# Patient Record
Sex: Female | Born: 1940 | Race: White | Hispanic: No | Marital: Married | Smoking: Former smoker
Health system: Southern US, Community
[De-identification: ages and names within clinical notes are randomized; demographics above are authoritative.]

## PROBLEM LIST (undated history)

## (undated) DIAGNOSIS — T4145XA Adverse effect of unspecified anesthetic, initial encounter: Secondary | ICD-10-CM

## (undated) DIAGNOSIS — J9819 Other pulmonary collapse: Secondary | ICD-10-CM

## (undated) DIAGNOSIS — Z9889 Other specified postprocedural states: Secondary | ICD-10-CM

## (undated) DIAGNOSIS — I10 Essential (primary) hypertension: Secondary | ICD-10-CM

## (undated) DIAGNOSIS — J9383 Other pneumothorax: Secondary | ICD-10-CM

## (undated) DIAGNOSIS — R112 Nausea with vomiting, unspecified: Secondary | ICD-10-CM

## (undated) DIAGNOSIS — K219 Gastro-esophageal reflux disease without esophagitis: Secondary | ICD-10-CM

## (undated) DIAGNOSIS — D649 Anemia, unspecified: Secondary | ICD-10-CM

## (undated) DIAGNOSIS — E785 Hyperlipidemia, unspecified: Secondary | ICD-10-CM

## (undated) DIAGNOSIS — T8859XA Other complications of anesthesia, initial encounter: Secondary | ICD-10-CM

## (undated) DIAGNOSIS — K279 Peptic ulcer, site unspecified, unspecified as acute or chronic, without hemorrhage or perforation: Secondary | ICD-10-CM

## (undated) DIAGNOSIS — M810 Age-related osteoporosis without current pathological fracture: Secondary | ICD-10-CM

## (undated) HISTORY — DX: Age-related osteoporosis without current pathological fracture: M81.0

## (undated) HISTORY — DX: Other pneumothorax: J93.83

## (undated) HISTORY — PX: OTHER SURGICAL HISTORY: SHX169

## (undated) HISTORY — PX: SPINE SURGERY: SHX786

## (undated) HISTORY — DX: Other pulmonary collapse: J98.19

## (undated) HISTORY — DX: Hyperlipidemia, unspecified: E78.5

## (undated) HISTORY — PX: ABDOMINAL HYSTERECTOMY: SHX81

---

## 2000-12-30 ENCOUNTER — Other Ambulatory Visit: Admission: RE | Admit: 2000-12-30 | Discharge: 2000-12-30 | Payer: Self-pay | Admitting: Obstetrics and Gynecology

## 2002-05-11 ENCOUNTER — Encounter: Payer: Self-pay | Admitting: Obstetrics and Gynecology

## 2002-05-11 ENCOUNTER — Ambulatory Visit (HOSPITAL_COMMUNITY): Admission: RE | Admit: 2002-05-11 | Discharge: 2002-05-11 | Payer: Self-pay | Admitting: Obstetrics and Gynecology

## 2003-08-05 HISTORY — PX: OOPHORECTOMY: SHX86

## 2004-01-11 ENCOUNTER — Inpatient Hospital Stay (HOSPITAL_COMMUNITY): Admission: RE | Admit: 2004-01-11 | Discharge: 2004-01-30 | Payer: Self-pay | Admitting: Obstetrics & Gynecology

## 2004-01-19 ENCOUNTER — Encounter: Payer: Self-pay | Admitting: Pulmonary Disease

## 2004-02-01 ENCOUNTER — Emergency Department (HOSPITAL_COMMUNITY): Admission: EM | Admit: 2004-02-01 | Discharge: 2004-02-01 | Payer: Self-pay | Admitting: *Deleted

## 2004-03-21 ENCOUNTER — Ambulatory Visit (HOSPITAL_BASED_OUTPATIENT_CLINIC_OR_DEPARTMENT_OTHER): Admission: RE | Admit: 2004-03-21 | Discharge: 2004-03-21 | Payer: Self-pay | Admitting: Urology

## 2004-05-06 ENCOUNTER — Ambulatory Visit (HOSPITAL_COMMUNITY): Admission: RE | Admit: 2004-05-06 | Discharge: 2004-05-06 | Payer: Self-pay | Admitting: Obstetrics and Gynecology

## 2004-07-17 ENCOUNTER — Ambulatory Visit: Payer: Self-pay | Admitting: Internal Medicine

## 2004-07-17 ENCOUNTER — Ambulatory Visit (HOSPITAL_COMMUNITY): Admission: RE | Admit: 2004-07-17 | Discharge: 2004-07-17 | Payer: Self-pay | Admitting: Internal Medicine

## 2005-05-17 ENCOUNTER — Emergency Department (HOSPITAL_COMMUNITY): Admission: EM | Admit: 2005-05-17 | Discharge: 2005-05-18 | Payer: Self-pay | Admitting: Emergency Medicine

## 2005-06-19 ENCOUNTER — Other Ambulatory Visit: Admission: RE | Admit: 2005-06-19 | Discharge: 2005-06-19 | Payer: Self-pay | Admitting: Dermatology

## 2005-06-23 ENCOUNTER — Ambulatory Visit (HOSPITAL_COMMUNITY): Admission: RE | Admit: 2005-06-23 | Discharge: 2005-06-23 | Payer: Self-pay | Admitting: Obstetrics and Gynecology

## 2006-07-20 ENCOUNTER — Ambulatory Visit (HOSPITAL_COMMUNITY): Admission: RE | Admit: 2006-07-20 | Discharge: 2006-07-20 | Payer: Self-pay | Admitting: Family Medicine

## 2007-04-01 ENCOUNTER — Ambulatory Visit (HOSPITAL_COMMUNITY): Admission: RE | Admit: 2007-04-01 | Discharge: 2007-04-01 | Payer: Self-pay | Admitting: Family Medicine

## 2009-02-20 ENCOUNTER — Ambulatory Visit (HOSPITAL_COMMUNITY): Admission: RE | Admit: 2009-02-20 | Discharge: 2009-02-20 | Payer: Self-pay | Admitting: Family Medicine

## 2009-05-16 ENCOUNTER — Ambulatory Visit (HOSPITAL_COMMUNITY): Admission: RE | Admit: 2009-05-16 | Discharge: 2009-05-16 | Payer: Self-pay | Admitting: Family Medicine

## 2009-05-31 ENCOUNTER — Ambulatory Visit (HOSPITAL_COMMUNITY): Admission: RE | Admit: 2009-05-31 | Discharge: 2009-05-31 | Payer: Self-pay | Admitting: Family Medicine

## 2009-08-13 ENCOUNTER — Ambulatory Visit: Payer: Self-pay | Admitting: Vascular Surgery

## 2009-11-13 ENCOUNTER — Ambulatory Visit: Payer: Self-pay | Admitting: Vascular Surgery

## 2010-01-07 ENCOUNTER — Ambulatory Visit: Payer: Self-pay | Admitting: Vascular Surgery

## 2010-01-15 ENCOUNTER — Ambulatory Visit: Payer: Self-pay | Admitting: Vascular Surgery

## 2010-01-24 ENCOUNTER — Ambulatory Visit: Payer: Self-pay | Admitting: Vascular Surgery

## 2010-02-19 ENCOUNTER — Ambulatory Visit: Payer: Self-pay | Admitting: Vascular Surgery

## 2010-05-13 ENCOUNTER — Ambulatory Visit (HOSPITAL_COMMUNITY): Admission: RE | Admit: 2010-05-13 | Discharge: 2010-05-13 | Payer: Self-pay | Admitting: Family Medicine

## 2010-06-06 ENCOUNTER — Ambulatory Visit (HOSPITAL_COMMUNITY): Admission: RE | Admit: 2010-06-06 | Discharge: 2010-06-06 | Payer: Self-pay | Admitting: Family Medicine

## 2010-12-17 NOTE — Assessment & Plan Note (Signed)
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  She  tolerated the procedure well.  She will return in 1 week for venous  duplex exam to confirm closure of the great saphenous vein.     Quita Skye Hart Rochester, M.D.  Electronically Signed   JDL/MEDQ  D:  01/07/2010  T:  01/08/2010  Job:  [id number]

## 2010-12-17 NOTE — Procedures (Signed)
LOWER EXTREMITY VENOUS REFLUX EXAM   INDICATION:  Ropey varicose veins.   EXAM:  Using color-flow imaging and pulse Doppler spectral analysis, the  bilateral common femoral, superficial femoral, popliteal, posterior  tibial, greater and lesser saphenous veins are evaluated.  There is no  evidence suggesting deep venous insufficiency in the bilateral lower  extremities.   The bilateral saphenofemoral junctions are competent. The right GSV is  not competent with Reflux of >571milliseconds with the caliber as  described below.   The bilateral proximal short saphenous veins demonstrate competency.   GSV Diameter (used if found to be incompetent only)                                            Right    Left  Proximal Greater Saphenous Vein           0.54 cm  cm  Proximal-to-mid-thigh                     0.37 cm  cm  Mid thigh                                 0.36 cm  cm  Mid-distal thigh                          cm       cm  Distal thigh                              0.31 cm  cm  Knee                                      0.36 cm  cm   IMPRESSION:  1. Right greater saphenous vein Reflux with >555milliseconds is      identified with the caliber ranging from 0.36 cm to 0.54 cm to the      groin.  2. The bilateral greater saphenous veins are not aneurysmal.  3. The bilateral greater saphenous veins are not tortuous.  4. The deep venous system is competent.  5. The bilateral lesser saphenous vein is competent.   ___________________________________________  Quita Skye. Hart [place], M.D.   CB/MEDQ  D:  08/13/2009  T:  08/13/2009  Job:  [id number]

## 2010-12-17 NOTE — Assessment & Plan Note (Signed)
OFFICE VISIT   Jeanne Rogers, Jeanne Rogers  DOB:  1941-01-08                                       11/13/2009  CHART#:[id number]   The patient returns today for further followup regarding her severe  venous insufficiency of her right leg.  She has had bulging varicosities  which have increased in size over the past 25 years and now cause  aching, throbbing and burning discomfort in the right thigh and calf  particularly when she is standing or sitting.  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  The long-  leg elastic compression stockings for the past 3 months have not  improved her symptoms nor has ibuprofen or elevation of her legs as much  as possible.  It is affecting her daily living.   PHYSICAL EXAM:  Today she continues to have bulging varicosities in  distal thigh medially along the medial calf extending into the pretibial  region below the knee on the right.  She has a large nest of reticular  and spider veins in the ankle area with palpable dorsalis pedis pulses  bilaterally.   Review of her venous duplex exam reveals gross reflux of the entire  right great saphenous vein from the knee to the saphenofemoral junction  which is feeding these bulging varicosities which are quite symptomatic.  Deep system is normal.   I think she should undergo laser ablation of the right great saphenous  vein with multiple stab phlebectomies to relieve her symptomatology.  We  will proceed with precertification to perform this in the near future.     Quita Skye Hart Rochester, M.D.  Electronically Signed   JDL/MEDQ  D:  11/13/2009  T:  11/14/2009  Job:  [id number]

## 2010-12-17 NOTE — Consult Note (Signed)
NEW PATIENT CONSULTATION   Rogers, Jeanne E  DOB:  03/06/1941                                       08/13/2009  CHART#:[id number]   NEW PATIENT CONSULTATION:  The patient is a 70 year old female patient  referred by Dr. Gerda Diss for severe venous insufficiency of the right leg  with painful varicosities.  This patient has had varicosities in the  right leg over the last 25 years, which have gradually increased in size  have become increasingly symptomatic.  She now complains of aching,  throbbing and burning discomfort in the right lower thigh and calf which  worsens as the day progresses.  She develops swelling in the ankle as  the day progresses, increasing her symptomatology.  She has had no  history of bleeding or stasis ulcers, no thrombophlebitis or deep venous  thrombosis.  She does not wear elastic compression stockings and  elevates her legs as much as possible during the day.  She does not take  pain medicine.  She states this is affecting her ability to do normal  activities because of the increasing symptoms which she is experiencing.   PAST MEDICAL HISTORY:  1. Hyperlipidemia.  2. History of TIA in 1997.  3. Negative for diabetes, hypertension, coronary artery disease, COPD      or stroke.   PAST SURGICAL HISTORY:  1. Bladder tack procedure.  2. Bilateral bunion repairs of the lower extremities.   FAMILY HISTORY:  Positive for stroke in her mother and father.  Negative  for diabetes or coronary artery disease.   SOCIAL HISTORY:  She is married, has 4 children.  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  Has not smoked in over 20 years.  Does not use  alcohol.   REVIEW OF SYSTEMS:  Positive for weight gain, nocturia, leg discomfort  as noted in the present illness, bunion surgery, both feet.  Negative  for all other systems in the review of systems.   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   PHYSICAL EXAMINATION:  Blood pressure is 151/80, heart rate is 86,  respirations 20.  Generally, she is a healthy-appearing female who is in  no apparent distress.  She is alert and oriented x3.  She is well-  developed and well-nourished.  HEENT exam is normal with EOMs intact.  Neck is supple, 3+ carotid pulses.  No bruits are audible.  Neurologic  exam is normal.  Chest clear to auscultation.  Cardiovascular reveals a  regular rhythm, no murmurs.  Abdomen is soft, nontender, with no masses  palpable.  Musculoskeletal exam:  No major deformities.  Skin exam  reveals no rashes.  Lower extremity exam reveals 3+ femoral, popliteal  and dorsalis pedis pulses bilaterally.  She has severe venous  insufficiency of the right leg with bulging varicosities in the distal  thigh extending into the medial calf anterior to the tibia with a large  pattern of reticular and spider veins in the lower third of the right  leg.  Left leg has no bulging varicosities but does have some reticular  and spider veins.   Venous duplex exam in our office today reveals gross reflux in the right  great saphenous vein feeding these varicosities in the lower thigh and  calf  with normal deep system.   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  We will treat  her with long-leg elastic compression stockings (20 mm-30-mm gradient)  as well as elevation and daily ibuprofen to see if it will relieve her  symptoms.  She will return in 3 months and if she has had no  improvement, I think she would be best treated by laser ablation of the  right great saphenous vein with multiple stab phlebectomies.     Jeanne Rogers, M.D.  Electronically Signed   JDL/MEDQ  D:  08/13/2009  T:  08/14/2009  Job:  [id number]   cc:   Lorin Picket A. Gerda Diss, MD

## 2010-12-17 NOTE — Assessment & Plan Note (Signed)
OFFICE VISIT   Weida, MARY E  DOB:  [birth year]                                       01/15/2010  CHART#:[id number]   The patient had laser ablation of her right great saphenous vein with 10-  20 stab phlebectomies for painful varicosities performed on June 6.  She  returns today for initial followup.  She has had some moderate  discomfort along the medial and proximal thigh area along the course of  the great saphenous vein which is improving.  She has had no pain at the  stab phlebectomy sites and no distal edema.  She has been wearing her  elastic compression stocking which makes her leg feel better.   On exam today her blood pressure is 146/81, heart rate 79, respirations  14.  There is mild tenderness along the course of the great saphenous  vein in the thigh and the stab phlebectomy sites are healing nicely.  She has a 3+ dorsalis pedis pulse distally with no edema.  Venous duplex  exam reveals total closure of the right great saphenous vein with no  evidence of deep venous obstruction.  She was reassured regarding these  findings and will return in the near future for some courses of  sclerotherapy to complete her treatment.     Quita Skye Hart Rochester, M.D.  Electronically Signed   JDL/MEDQ  D:  01/15/2010  T:  01/16/2010  Job:  [id number]

## 2010-12-17 NOTE — Procedures (Signed)
DUPLEX DEEP VENOUS EXAM - LOWER EXTREMITY   INDICATION:  Follow up 1 week post right greater saphenous laser  procedure.   HISTORY:  Edema:  Yes.  Trauma/Surgery:  On 01/07/10, right greater saphenous vein laser  procedure.  Pain:  Yes.  PE:  No.  Previous DVT:  No.  Anticoagulants:  No.  Other:   DUPLEX EXAM:                CFV   SFV   PopV  PTV    GSV                R  L  R  L  R  L  R   L  R  L  Thrombosis    o  o  o     o     o  Spontaneous   +  +  +     +     +  Phasic        +  +  +     +     +  Augmentation  +  +  +     +     +  Compressible  +  +  +     +     +  Competent     +  +  +     +     +   Legend:  + - yes  o - no  p - partial  D - decreased   IMPRESSION:  1. The right leg is negative for deep venous thrombosis.  2. The right greater saphenous vein is lasered closed.  Areas of      phlebectomey's appear to be closed off.    _____________________________  Quita Skye. Hart Rochester, M.D.   NT/MEDQ  D:  01/15/2010  T:  01/15/2010  Job:  [id number]

## 2010-12-20 NOTE — Procedures (Signed)
NAME:  Jeanne Rogers, Jeanne Rogers                             ACCOUNT NO.:  [id number]   MEDICAL RECORD NO.:  [id number]                  PATIENT TYPE:  INP   LOCATION:                                       FACILITY:   PHYSICIAN:  Seligman Bing, M.D.               DATE OF BIRTH:  07-10-1941   DATE OF PROCEDURE:  DATE OF DISCHARGE:  01/19/2004                                  ECHOCARDIOGRAM   REFERRING PHYSICIAN:  Drs. Luking and Eure   INDICATIONS:  A 70 year old woman with congestive heart failure and  ventricular tachycardia.   M-MODE TRACINGS:  1. Aorta 2.4  2. Left atrium 2.3.  3. Septum 1.1.  4. Posterior wall 1.0.  5. LV diastole 3.1  6. LV systole 1.8.   IMPRESSION:  1. Technically difficult and somewhat limited echocardiographic study.  2. Normal left atrial size; right atrium not adequately imaged.  3. Normal right ventricular size and function.  4. Normal mitral valve.  5. Aortic, pulmonic, and tricuspid valves not adequately imaged.  6. Normal descending aorta.  7. Normal internal dimension of the left ventricle; wall thickness at the     upper limit of normal.  Normal regional and global LV systolic function.      ___________________________________________                                            Apollo Bing, M.D.   RR/MEDQ  D:  01/19/2004  T:  01/20/2004  Job:  [id number]

## 2010-12-20 NOTE — Op Note (Signed)
NAME:  Suess, Alben Spittle                             ACCOUNT NO.:  [id number]   MEDICAL RECORD NO.:  [id number]                   PATIENT TYPE:  INP   LOCATION:  A425                                 FACILITY:  [facility]   PHYSICIAN:  R. Roetta Sessions, M.D.              DATE OF BIRTH:  [birth year]   DATE OF PROCEDURE:  01/15/2004  DATE OF DISCHARGE:                                 OPERATIVE REPORT   PROCEDURE:  Esophagogastroduodenoscopy with biopsy.   INDICATIONS FOR PROCEDURE:  The patient is a 70 year old lady status post  GYN surgery 01/11/2004.  She has had persistent nausea, vomiting, and some  Gastroccult-positive coffee-ground emesis.  She has remained hemodynamically  stable.  Her hemoglobin has remained stable at 11.4.  Repeat LFTs came back  okay.  EGD is now being done.  This approach has been discussed with the  patient at length.  The potential risks, benefits, and alternatives have  been reviewed and questions answered.  Please see the documentation in the  medical record.   PROCEDURE:  O2 saturation, blood pressure, pulses, and respirations were  monitored throughout the entire procedure.  Conscious sedation was with  Versed 1 mg IV, Demerol 25 mg IV in divided doses, Cetacaine spray for  topical oropharyngeal anesthesia.  The instrument used was the Olympus video  chip system.   FINDINGS:  Examination of the tubular esophagus revealed multiple 2-mm pale  nodules throughout the esophagus.  There was excoriation at the GE junction  and a noncritical ring.  The esophageal mucosa otherwise appeared normal,  and the EG junction was easily traversed.   Stomach:  The gastric cavity was empty and insufflated well with air.  Thorough examination of the gastric mucosa including retroflex view of the  proximal stomach and esophagogastric junction demonstrated only a small  hiatal hernia and three 2 to 3-mm stellate ulcers in the antrum without  bleeding stigmata.  Please see photos.  The  pylorus was patent and easily  traversed.   Duodenum:  Examination of the bulb and second portion revealed no  abnormalities.   THERAPEUTIC/DIAGNOSTIC MANEUVERS PERFORMED:  One of the nodules in the  esophagus was biopsied for histologic study.  The patient tolerated the  procedure well and was reactive in endoscopy.   IMPRESSION:  1. 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  2. Excoriation at the esophagogastric junction likely indicative of a minor     Mallory-Weiss tear.  3. 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  Otherwise normal     esophagus.  4. Stomach with small hiatal hernia.  Three small gastric ulcers without     bleeding stigmata.  Otherwise normal gastric mucosa.  5. Normal first and second portions of the duodenum.  6. I suspect the patient has suffered trivial upper gastrointestinal     bleeding  secondary to a minor Mallory-Weiss tear.   RECOMMENDATIONS:  1. Check H pylori serologies.  2. Avoid nonsteroidals.  3. Clear liquid diet post-endoscopy, advance then as tolerated.  Will change     her medications over to oral administration on 01/16/2004 if she does okay     clinically.   The findings and recommendations have been discussed with Dr. Despina Hidden.   As a totally separate issue, she had a sigmoidoscopy five years ago.  She  has never had a full colonoscopy.  She ought to consider a screening  colonoscopy sometime between now and the end of the year.      ___________________________________________                                            Jonathon Bellows, M.D.   RMR/MEDQ  D:  01/15/2004  T:  01/15/2004  Job:  [id number]   cc:   Lazaro Arms, M.D.  [address], [address]  [place]  [place] [address]  Fax: [phone]   W. Simone Curia, M.D.  [address]  [place]  [place] [address]  Fax: [phone]

## 2010-12-20 NOTE — H&P (Signed)
NAME:  Samudio, Alben Spittle                             ACCOUNT NO.:  [id number]   MEDICAL RECORD NO.:  [id number]                   PATIENT TYPE:  AMB   LOCATION:  DAY                                  FACILITY:  [facility]   PHYSICIAN:  Lazaro Arms, M.D.                DATE OF BIRTH:  06-Jun-1941   DATE OF ADMISSION:  DATE OF DISCHARGE:                                HISTORY & PHYSICAL   HISTORY OF PRESENT ILLNESS:  Jeanne Rogers is a 70 year old white female who has been  followed in our practice for years.  She has had worsening pelvic prolapse  and she has actually been trying to use a pessary for some time.  The  patient also has mixed urinary incontinence, both stress and urge.  On  examination in the office, she has a grade 3 cystocele in the midline, a  grade 2 uterine prolapse.  I am unimpressed with her degree of rectocele at  this point and would recommend that she have that repaired in the future as  a separate procedure if that becomes more of a problem.  After failing 2  pessaries, I recommend that she have a TVH/BSO, anterior colporrhaphy with  Pelvicol graft and because of her significant cystocele, if we were to  repair that without doing a sling, she would have, I am sure, stress  incontinence, as a result, she will have a retropubic sling placed.  She is  on Detrol LA preoperatively because of frequency and urgency at night; I put  her on that on Dec 13, 2003 and that has gotten significantly better in the  meantime.  She has also been on preoperative Premarin vaginal cream.   PAST MEDICAL HISTORY:  She has a history of bilateral collapsed lungs.   PAST SURGICAL HISTORY:  Past surgical history is related to the therapy for  the collapsed lungs with chest tubes and in 1996, an endometrial biopsy.   PAST OBSTETRICAL HISTORY:  Four vaginal deliveries and 1 miscarriage.   CURRENT MEDICATIONS:  Her current medications are just over-the-counter anti-  inflammatories in the past for her back  and she also has taken Relafen and  Mobic for pain in the low back and she has been on the Premarin vaginal  cream and the Detrol.   ALLERGIES:  Allergies are to METHERGINE.   REVIEW OF SYSTEMS:  Review of systems otherwise negative.   PHYSICAL EXAMINATION:  HEENT:  Unremarkable.  NECK:  Thyroid is normal.  LUNGS:  Lungs are clear.  HEART:  Heart is regular rate and rhythm with murmur, regurgitation or  gallop.  BREASTS:  Breasts without masses, discharge or skin changes.  ABDOMEN:  Abdomen is benign with no hepatosplenomegaly or masses.  PELVIC:  She has normal external genitalia.  She does have an obvious  cystocele at rest in the supine position and just behind that is her uterine  prolapse as well.  The adnexa are negative.  RECTAL:  Rectal exam I would say is a grade 1 rectocele but fairly  unimpressive and she does want to maintain sexual function.  EXTREMITIES:  Extremities are warm with no edema.  NEUROLOGICAL:  Exam is grossly intact.   IMPRESSION:  Symptomatic pelvic prolapse with midline cystocele and uterine  prolapse.   PLAN:  The patient is admitted for a TVH/BSO, anterior colporrhaphy with  Pelvicol graft and a retropubic sling;  the patient does want to continue  sexual function.  We will also do a vaginal vault suspension.  The patient  understands the risks, benefits, indications and alternatives and will  proceed.     ___________________________________________                                         Lazaro Arms, M.D.   Loraine Maple  D:  01/10/2004  T:  01/11/2004  Job:  [id number]

## 2010-12-20 NOTE — Procedures (Signed)
NAME:  WILFRED, SIVERSON                             ACCOUNT NO.:  [id number]   MEDICAL RECORD NO.:  [id number]                   PATIENT TYPE:  INP   LOCATION:  0156                                 FACILITY:  [facility]   PHYSICIAN:  Edward L. Juanetta Gosling, M.D.             DATE OF BIRTH:  27-Jan-1941   DATE OF PROCEDURE:  DATE OF DISCHARGE:                                EKG INTERPRETATION   TIME:  January 19, 2004, at 13:36   FINDINGS:  1. The computer has read the rhythm as atrial flutter with 2:1 AV     conduction.  I am not certain that is correct.  I think that this is a     sinus rhythm. I do not see definite flutter waves, although there are T-     wave abnormalities which could look like a P wave.  2. There are ST-T wave abnormalities which may be due to ischemia.   IMPRESSION:  Abnormal electrocardiogram.      ___________________________________________                                            Oneal Deputy. Juanetta Gosling, M.D.   Gwenlyn Found  D:  01/22/2004  T:  01/22/2004  Job:  [id number]

## 2010-12-20 NOTE — Op Note (Signed)
NAME:  CASHAY, MANGANELLI                             ACCOUNT NO.:  [id number]   MEDICAL RECORD NO.:  [id number]                   PATIENT TYPE:  INP   LOCATION:  0344                                 FACILITY:  [facility]   PHYSICIAN:  Excell Seltzer. Annabell Howells, M.D.                 DATE OF BIRTH:  1941-06-13   DATE OF PROCEDURE:  01/29/2004  DATE OF DISCHARGE:                                 OPERATIVE REPORT   OPERATION/PROCEDURE:  1. Cystoscopy.  2. Left retrograde pyelogram.  3. Left ureteroscopy.  4. Insertion of left double-J stent.   PREOPERATIVE DIAGNOSIS:  Left ureteral obstruction.   POSTOPERATIVE DIAGNOSIS:  Left ureteral obstruction, likely due to surgical  entrapment of the ureter from recent vaginal hysterectomy.   SURGEON:  Excell Seltzer. Annabell Howells, M.D.   ANESTHESIA:  MAC.   DRAINS:  7-French x 24 cm double-J stent and 16-French Foley catheter.   COMPLICATIONS:  None.   INDICATIONS:  Mrs. Runner is a 70 year old white female who has had a  complicated hospitalization with a postoperative Mallory-Weiss tear and  aspiration pneumonia following a vaginal hysterectomy with bilateral  salpingo-oophorectomy and sling procedure.  She had been having left flank  pain and CT scan demonstrated left hydronephrosis.  This progressed  throughout her stay and it was felt that cystoscopy and retrograde were  indicated.  The level of obstruction appeared to be in the pelvis at the  level of her recent surgery.   FINDINGS:  The patient had been on Augmentin.  Recent urine was clear.   DESCRIPTION OF PROCEDURE:  She was taken to the operating room where she was  placed in the lithotomy position.  Sedation was induced.  Her peritoneum and  genitalia was prepped with Betadine solution and she was draped in the usual  sterile fashion.  Her urethra was instilled with 2% lidocaine jelly.  Cystoscopy was performed using a 22-French scope and the 12-degree lens  examination revealed an unremarkable urethra that did  not appear to be  overcorrected related to her recent sling. The bladder wall had mild  trabeculation and there was some catheter irritation on the posterior wall.  No tumors were noted within the bladder.  There were some calcium deposits  from encrustation of her Foley catheter.  The ureteral orifices were in  their normal anatomical position.  The left ureteral orifice did appear to  be a little bit heaped up but not remarkably so.  The left ureteral orifice  was cannulated with the 5-French open-end catheter.  Contrast was instilled  in the retrograde fashion.  There was apparent medial deviation of the  ureter, approximately 2 cm proximal to the orifice.  Contrast did not flow  beyond this point.  An attempt was made to pass a glide wire through the  open-end catheter, but I was unable to successfully negotiate it by the  obstruction.  I then removed the cystoscope and inserted a 6-French short  ureteroscope without difficulty up the ureteral orifice to the point of  obstruction which had a valve-like appearance consistent with a medial  kinking of the ureter.  A glide wire was then passed to the level of the  kidney.  The ureteroscope was removed and the cystoscope was placed over the  wire.  A 5-French open-end catheter was then inserted over the glide wire  and there was initial resistance but it eventually passed without difficulty  to the level of the kidney.  A prompt hydronephrotic drip was noted.  Contrast instilled demonstrated placement within the renal collecting  system.  She then underwent placement of a guidewire through the open-end  catheter after removal of the glide wire.  Once this was appropriately  positioned in the kidney, a 7-French x 24 cm double-J stent was inserted  over the wire to the kidney.  The guidewire was removed leaving good coil in  the kidney and the bladder.  The cystoscope was then removed after draining  the bladder and a 16-French Foley catheter  was inserted without difficulty.  The balloon was filled with 10 mL sterile fluid and placed to straight  drainage.  The patient was taken down from the lithotomy position and moved  to the recovery room in stable condition.   She will be catheterized over night and given a voiding trial.  The stent  should be left in for about six weeks to allow weakening of any suture  material that has associated with the kinking at which time she will need to  undergo recystoscopy with balloon dilation of the ureter, hopefully allowing  [place] to avoid a reimplantation.  There were no complications during my portion  of the procedure.                                               Excell Seltzer. Annabell Howells, M.D.    JJW/MEDQ  D:  01/29/2004  T:  01/29/2004  Job:  [id number]   cc:   Oley Balm. Sung Amabile, M.D. [facility]   Rollene Rotunda, M.D.   Scott A. Gerda Diss, M.D.  [address], [address]  [place]  [place] [address]  Fax: [phone]   Lazaro Arms, M.D.  [address]  [place]  [place] [address]  Fax: [phone]

## 2010-12-20 NOTE — Procedures (Signed)
NAME:  SHUKRI, NISTLER                             ACCOUNT NO.:  [id number]   MEDICAL RECORD NO.:  [id number]                   PATIENT TYPE:  INP   LOCATION:  0156                                 FACILITY:  [facility]   PHYSICIAN:  Edward L. Juanetta Gosling, M.D.             DATE OF BIRTH:  September 22, 1940   DATE OF PROCEDURE:  DATE OF DISCHARGE:                                EKG INTERPRETATION   TIME:  January 19, 2004, at 13:37  30.   FINDINGS:  The computer has read this as atrial flutter with 2:1 AV  conduction.  I do not see definite evidence of that, and I believe that this  is actually a sinus rhythm with a rate of about 115.  There are ST-T wave  abnormalities which could indicate ischemia.   IMPRESSION:  Abnormal electrocardiogram.      ___________________________________________                                            Oneal Deputy. Juanetta Gosling, M.D.   Gwenlyn Found  D:  01/22/2004  T:  01/22/2004  Job:  [id number]

## 2010-12-20 NOTE — Procedures (Signed)
NAME:  Jeanne Rogers, Jeanne Rogers                             ACCOUNT NO.:  [id number]   MEDICAL RECORD NO.:  [id number]                   PATIENT TYPE:  INP   LOCATION:  0156                                 FACILITY:  [facility]   PHYSICIAN:  Edward L. Juanetta Gosling, M.D.             DATE OF BIRTH:  May 23, 1941   DATE OF PROCEDURE:  DATE OF DISCHARGE:                                EKG INTERPRETATION   TIME:  January 19, 2004 at 13:32   FINDINGS:  1. The rhythm is sinus tachycardia.  2. There is probable left atrial enlargement.  3. ST-T wave abnormalities are seen which may indicate ischemia, but are     nonspecific.   IMPRESSION:  Abnormal electrocardiogram.      ___________________________________________                                            Oneal Deputy. Juanetta Gosling, M.D.   Gwenlyn Found  D:  01/22/2004  T:  01/22/2004  Job:  [id number]

## 2010-12-20 NOTE — Discharge Summary (Signed)
NAME:  Jeanne Rogers, Jeanne Rogers                             ACCOUNT NO.:  [id number]   MEDICAL RECORD NO.:  [id number]                   PATIENT TYPE:  INP   LOCATION:  0156                                 FACILITY:  [facility]   PHYSICIAN:  Donna Bernard, M.D.             DATE OF BIRTH:  Oct 24, 1940   DATE OF ADMISSION:  01/19/2004  DATE OF DISCHARGE:                                 DISCHARGE SUMMARY   SUBJECTIVE:  The patient notes sensation of shortness of breath and also  notes some lower- abdominal discomfort.   OBJECTIVE:  Vital signs afebrile.  Pulse 115.  Respiratory rate 24 breaths  per minute.  The patient is more alert than she had been earlier in the  week.  There is some mild tachypnea on exam.  Chest exam shows diffuse mild  crackles throughout the chest.  No significant wheezes, no rhonchi.  Heart,  mild tachycardia.  Positive flow murmur.  Abdomen, good bowel sounds.  Upper  abdomen, no significant tenderness.  Diffuse, mild lower abdominal pain.   ADDENDUM TO PROGRESS NOTE:  The patient had a particularly rocky night.  She  developed more shortness of breath.  She was transferred from Dr. Forestine Chute  service to my service.  A CT scan was done to rule out PE.  Thankfully, she  had no pulmonary emboli present.  However, there was multiple infiltrates  along with an increased edema pattern, along with bilateral effusions.   SIGNIFICANT LABS:  CBC:  White blood count up to 20,000 with a left shift.  Met-7, decent creatinine, 2.2, glucose within normal limits.  Blood gas on  100% O2 showed pCO2 of 39, and pO2 mid 60's, pH 7.43.   ADDENDUM TO HISTORY:  I spoke with Dr. Jena Gauss, Dr. Despina Hidden, Dr. Juanetta Gosling, Dr.  Katrinka Blazing, Dr. Rosalia Hammers and Dr. Okey Dupre about the patient, covering all the above  symptomatology and diagnostic findings.   ASSESSMENT:  1. Worsening respiratory status, multifactorial, with probable aspiration     infiltrate, bilateral effusions, questionable element of congestive heart  failure.  There is significant consideration that the patient is getting     into more of an acute respiratory distress syndrome type syndrome which     is certainly a major concern in the sense of the patient's overall     status.  I am also concerned that congestive heart failure may also be a     part of things.  I have consulted with [facility].  They are yet     to see the patient.  2. Nutritional status.  Nutrition is poor.  I think we need to press on TPN.     I have a call in to Dr. Katrinka Blazing about placing a central line and pressing     on with this.  3. Partial ileus.  The patient does have some bowel sounds and relatively  soft abdomen, but I think it is probably partial ileus and possibly     related to the pulmonary issues.   PLAN:  1. IV Lasix, maintain same oxygen support.  2. Dr. Juanetta Gosling has added IV steroids which I support, particularly with     questionable element of aspiration pneumonitis.  3. I will plan on going ahead and referring to a tertiary care center for     active management of the patient's     problems.  4. Family is aware of the severity of the patient's illness, and are in     agreement with our support and plans for therapy.     ___________________________________________                                         Donna Bernard, M.D.   WSL/MEDQ  D:  01/19/2004  T:  01/20/2004  Job:  [id number]

## 2010-12-20 NOTE — Op Note (Signed)
NAME:  Jeanne Rogers, Jeanne Rogers                             ACCOUNT NO.:  [id number]   MEDICAL RECORD NO.:  [id number]                   PATIENT TYPE:  AMB   LOCATION:  NESC                                 FACILITY:  [facility]   PHYSICIAN:  Excell Seltzer. Annabell Howells, M.D.                 DATE OF BIRTH:  June 07, 1941   DATE OF PROCEDURE:  03/21/2004  DATE OF DISCHARGE:                                 OPERATIVE REPORT   PREOPERATIVE DIAGNOSES:  Left ureteral obstruction.   POSTOPERATIVE DIAGNOSES:  Left ureteral obstruction with urinary  calcification in the right upper quadrant.   PROCEDURE:  Cystoscopy, bilateral retrograde pyelograms, left ureteroscopy  with balloon dilation and insertion of left double J stent.   SURGEON:  Excell Seltzer. Annabell Howells, M.D.   ANESTHESIA:  MAC.   DRAINS:  8.5 French x 24 cm double J stent.   COMPLICATIONS:  None.   INDICATIONS FOR PROCEDURE:  Jeanne Rogers is a 70 year old white female who had a  left ureteral obstruction following vaginal hysterectomy. She had originally  undergone placement of a ureteral stent for kinking of the ureter and  returns now for reevaluation, balloon dilation and stent exchange.   FINDINGS:  The patient was given p.o. Cipro preoperatively. She was taken to  the operating room where sedation was induced. She was placed in lithotomy  position, her perineum and genitalia were prepped with Betadine solution and  she was draped in the usual sterile fashion. Cystoscopy was performed using  a 22 [place] scope and the 12 degree lens. The stent was grasped at the right  orifice with alligator forceps and removed through the urethral meatus. A  guidewire was passed through the stent up to the kidney. On fluoroscopic  views during this time, the distal ureter appeared to have a normal path no  longer exhibiting any sort of medial deviation. Once the guidewire was in  place, the cystoscope was removed then a 6.5 [place] short ureteroscope was  then passed along side the  wire. Inspection revealed some residual edema at  the area of a previous obstruction with a patent ureteral lumen and no  kinking of the ureter. At this point, I noted a 1 cm calcification in the  right upper quadrant and I did not recall whether this had had been present  on previous films and was concerned that there was a possibility of a stone  in this area so a 5 [place] open end catheter was then used through the  cystoscope to perform a gentle retrograde on that side. This study revealed  the ureter passing medially to the calcification with no ureteral or  intrarenal abnormalities noted.  After completion of the retrograde  pyelogram, a 10 cm 15 French balloon dilation catheter was passed over the  guidewire and the distal ureter was dilated. The waist disappeared rapidly  at about 8 atmospheres of pressure. Dilation  was carried up to 14  atmospheres and held for approximately 30 seconds before deflating the  balloon.  At this point, I noted that the guidewire appeared to be somewhat  distal and felt that I needed to ensure that it was properly positioned  within the kidney and open end catheter was passed over the guidewire. The  guidewire was removed and contrast was instilled. This revealed that the  wire was indeed below the UPJ, there was a kink in the ureter just below the  UPJ. A Glidewire was then passed through the open end catheter to negotiate  this kink. Once negotiated the open end catheter could be advanced to the  kidney and the kink straightened out. Standard guidewire was then replaced.  The open end catheter was removed and an 8.5 [place] 24 cm double J stent was  then inserted  over the wire to the kidney. The wire was removed leaving a good coil in the  kidney and coil in the bladder. At this point, the bladder was drained, the  cystoscope was removed, the patient was taken down from lithotomy position  and removed to the recovery room in stable condition. There  were no  complications.                                               Excell Seltzer. Annabell Howells, M.D.    JJW/MEDQ  D:  03/21/2004  T:  03/21/2004  Job:  [id number]   cc:   Donna Bernard, M.D.  [address]  [place]  [place] [address]  Fax: [phone]   Lazaro Arms, M.D.  [address], [address]  [place]  [place] [address]  Fax: [phone]

## 2010-12-20 NOTE — Procedures (Signed)
NAME:  MISA, FEDORKO                             ACCOUNT NO.:  [id number]   MEDICAL RECORD NO.:  [id number]                   PATIENT TYPE:  INP   LOCATION:  0156                                 FACILITY:  [facility]   PHYSICIAN:  Edward L. Juanetta Gosling, M.D.             DATE OF BIRTH:  05/08/1941   DATE OF PROCEDURE:  DATE OF DISCHARGE:                                EKG INTERPRETATION   TIME:  January 19, 2004, at 1331   FINDINGS:  1. The rhythm is sinus tachycardia.  2. There is a question of left atrial enlargement.  3. There are ST-T wave abnormalities which are diffuse but nonspecific.   IMPRESSION:  Abnormal electrocardiogram.      ___________________________________________                                            Oneal Deputy. Juanetta Gosling, M.D.   Gwenlyn Found  D:  01/22/2004  T:  01/22/2004  Job:  [id number]

## 2010-12-20 NOTE — Op Note (Signed)
NAME:  Jeanne Rogers, Jeanne Rogers                   ACCOUNT NO.:  [id number]   MEDICAL RECORD NO.:  [id number]          PATIENT TYPE:  AMB   LOCATION:  DAY                           FACILITY:  [facility]   PHYSICIAN:  Lionel December, M.D.    DATE OF BIRTH:  05-23-1941   DATE OF PROCEDURE:  07/17/2004  DATE OF DISCHARGE:                                 OPERATIVE REPORT   PROCEDURE:  Total colonoscopy.   INDICATION:  Jeanne Rogers is a 70 year old, Caucasian female, who is here for a  screening colonoscopy.  Family history is negative for colorectal carcinoma.  Procedure risks were reviewed with the patient.  Informed consent was  obtained.   PREOPERATIVE MEDICATIONS:  1.  Demerol 25 mg IV.  2.  Versed 5 mg IV in divided dose.   FINDINGS:  Procedure performed in endoscopy suite.  The patient's vital  signs and O2 saturations were monitored during the procedure and remained  stable.  The patient was placed in left lateral decubitus position.  Rectal  examination performed.  No abnormality noted on external or digital exam.  Olympus video scope was placed in the rectum and advanced under vision into  the sigmoid colon and beyond.  Preparation was excellent.  Scattered small  to moderate sized diverticula were noted at the sigmoid and descending  colon.  The scope was passed in the cecum which was identified by  appendiceal orifice and ileocecal valve.  Pictures taken for the record.  As  the scope was withdrawn, colonic mucosa was carefully examined and was  normal throughout.  Rectal mucosa similarly was normal.  The scope was  retroflexed and examined.  Anorectal junction and small to moderate sized  hemorrhoids were noted below the dentate line.  Endoscope was straightened,  withdrawn.  The patient tolerated the procedure well.   FINAL DIAGNOSES:  1.  Left colonic diverticulosis.  2.  External hemorrhoids.  3.  No evidence of colonic polyps.   RECOMMENDATIONS:  1.  A high fiber diet.  2.  Citrucel or  equivalent, 1 tbs full daily.  3.  Yearly Hemoccults.  4.  She may consider next screening exam in 10 years from now.     [name]   NR/MEDQ  D:  07/17/2004  T:  07/17/2004  Job:  [id number]   cc:   Cyril Mourning, NP/Dr. Mable Fill A. Gerda Diss, MD  [address], [address]  [place]  [place] [address]  Fax: [phone]

## 2010-12-20 NOTE — Consult Note (Signed)
NAME:  Jeanne Rogers, Jeanne Rogers                             ACCOUNT NO.:  [id number]   MEDICAL RECORD NO.:  [id number]                   PATIENT TYPE:  INP   LOCATION:  A425                                 FACILITY:  [facility]   PHYSICIAN:  R. Roetta Sessions, M.D.              DATE OF BIRTH:  October 24, 1940   DATE OF CONSULTATION:  01/14/2004  DATE OF DISCHARGE:                                   CONSULTATION   REFERRING PHYSICIAN:  Lazaro Arms, M.D.   REASON FOR CONSULTATION:  Coffee ground emesis.   HISTORY OF PRESENT ILLNESS:  [name]. Rottman is a pleasant, 70 year old,  Caucasian female hospitalized on January 11, 2003, after she underwent a total  vaginal hysterectomy, BSO, and pelvic suspension procedure.   She has experienced nausea, vomiting, and more recently coffee ground emesis  repeatedly since postoperatively.  The coffee emesis is Gastroccult  positive.  She has not had any melena or rectal bleeding.  She has remained  hemodynamically stable.  Abdominal pain consistent with postoperative  discomfort has been well controlled on Buprenex.   The hemoglobin has dropped from 12.6 to 11.2.  also, the sodium was noted to  have gone from 138 to 125.   [name] relates daily heartburn symptoms chronically for which she takes  Tums every day.  In addition, she has been taking Naprosyn and Mobic since  she injured her back a couple of months ago.  She has not had any gross  hematemesis.  Again, no melena or rectal bleeding.  She does not have any  chronic abdominal pain.  No odynophagia or dysphagia.  No early satiety.  She tells me that her nausea has worsened with movement since surgery on  January 11, 2004.   She denies any upper abdominal discomfort.   There is no history of peptic ulcer disease or gallbladder in situ.  LFTs  were normal.   She has been given IV Prevacid and subsequently Protonix, scopolamine  continuously, and Reglan 10 mg IV q.6h. has been added to her regimen just  this  morning.   PAST MEDICAL HISTORY:  1. Significant for bilateral pneumothoraces.  2. She describes having undergone a flexible sigmoidoscopy for screening     purposes by Dr. Karilyn Cota five years ago without significant findings.  3. History of endometrial biopsy previously.  4. Stress urinary incontinence.   MEDICATIONS ON ADMISSION:  1. Over the counter NSAIDs as outlined above.  2. Premarin vaginal cream.  3. Detrol.   FAMILY HISTORY:  Negative for chronic GI or liver disease.  Specifically no  history of peptic ulcer disease or colon cancer.   SOCIAL HISTORY:  The patient is married.  She lives in [place].  She is on the [facility].  She has four grown children.  No tobacco.  No alcohol.   REVIEW OF SYSTEMS:  As in the  history of present illness.  No recent weight  loss.  No fever, chills, chest pain, or dyspnea on exertion.   PHYSICAL EXAMINATION:  GENERAL APPEARANCE:  A pleasant 70 year old lady who  appears comfortable and conversant in her hospital bed accompanied by her  husband.  VITAL SIGNS:  Temperature 99.3 degrees, BP 106/48, pulse 80.  SKIN:  Warm and dry.  There is no jaundice.  HEENT:  No scleral icterus.  She has a partial plate below.  Oral cavity  with no lesions.  NECK:  JVD is not prominent.  CHEST:  The lungs are clear to auscultation.  CARDIAC:  Regular rate and rhythm without murmur, rub, or gallop.  PELVIC:  She has vaginal packing in place.  ABDOMEN:  Somewhat obese and full.  Bowel sounds are present.  The abdomen  is really soft and nontender without appreciable mass or organomegaly.  EXTREMITIES:  She has __________ in place in the lower extremities.   ADDITIONAL LABORATORY DATA:  Yesterday her sodium was down to 125 from 138,  potassium 4.5, chloride 92, carbon dioxide 31, glucose 122, BUN 9,  creatinine 1.1, albumin 2.5, SGOT 20, SGPT 13, alkaline phosphatase 46, and  total bilirubin 0.5.  H&H 11.2 and 33.0 on January 14, 2004.  The H&H was 12.6  and 37.2 on admission.   ADMITTING IMPRESSION:  [name]. [name] is a pleasant 70 year old lady,  status post TVH and BSO with pelvic suspension procedures on January 11, 2004.  She has been experiencing postoperative nausea and vomiting ever since.  More recently she has experienced coffee emesis which has been noted to be  Gastroccult positive.  She has remained hemodynamically stable.  She has had  a modest drop in her hemoglobin.  Of note, her sodium has also dropped from  138 to 125.  Her nausea really is not associated with an vertiginous  features.   She has been taking nonsteroidals recently for back pain.  She describes  GERD for which she takes daily antacids.   I suspect that her nausea and vomiting is a primary issue related to the  effects of anesthesia and possible mild surgical ileus.  She has  gastrointestinal reflux disease by history and certainly could have erosive  reflux esophagitis exacerbated recently.  Peptic ulcer disease is certainly  not excluded, particularly with her recent NSAID use.  It is unclear to me  whether or not her hypernatremia is at all related to her nausea via a  central mediated process.  Hypernatremia may well be a separate issue simply  secondary to dilution.   RECOMMENDATIONS:  1. I fully agree with IV proton pump inhibitor therapy, as well as     symptomatic antiemetic therapy.  Reglan 10 mg IV q.6h. has been added to     her regimen this morning.  I concur with this approach.  Hopefully this     will help settle down her nausea.  Would watch for dystonic symptoms.  2. This lady needs to have an EGD to further evaluate her upper GI symptoms.     I will discuss this approach with Mr. And Mrs. Strand at some length.  The     potential risks, benefits, and alternatives have been reviewed.  We plan     to perform this procedure tomorrow morning. 3. Recheck laboratories tomorrow morning.   I would like to thank Dr. Despina Hidden  for allowing me to see this nice lady today.  Further recommendations to  follow.      ___________________________________________                                            Jonathon Bellows, M.D.   RMR/MEDQ  D:  01/14/2004  T:  01/14/2004  Job:  [id number]   cc:   Donna Bernard, M.D.  [address]  [place]  [place] [address]  Fax: [phone]

## 2010-12-20 NOTE — Consult Note (Signed)
NAME:  Jeanne Rogers, Jeanne Rogers                             ACCOUNT NO.:  [id number]   MEDICAL RECORD NO.:  [id number]                   PATIENT TYPE:  INP   LOCATION:  0156                                 FACILITY:  [facility]   PHYSICIAN:  Edward L. Juanetta Gosling, M.D.             DATE OF BIRTH:  Oct 26, 1940   DATE OF CONSULTATION:  01/19/2004  DATE OF DISCHARGE:                                   CONSULTATION   MEDICINE CONSULTATION:  Patient of Dr. Gerda Diss.   HISTORY:  Ms. Shawley is a 70 year old who has had problems with pelvic  prolapse and eventually underwent surgery for this.  Since her surgery she  has had problems with a GI bleed, she has had increasing respiratory  distress.  She has now been moved to the intensive care unit.   PAST MEDICAL HISTORY:  Past medical history is positive for COPD, she has a  significant smoking history having smoked as much as two packs a day for  some time.  She has not smoked in many years.  She did have pneumothorax  which prompted her stopping smoking.  Her past medical history is positive  for the pneumothoraces, screening flex sig, stress urinary incontinence,  previous endometrial biopsy and then she had a GI bleed as mentioned  postoperatively which was felt to be secondary to nonsteroidal anti-  inflammatory drugs.   FAMILY HISTORY:  Family history is negative for any peptic ulcer disease, no  history of any sort of colon diseases.  Her family history is apparently  negative for any sort of lung disease as well.   REVIEW OF SYSTEMS:  Review of systems except as mentioned is negative.   PHYSICAL EXAMINATION:  She is on 100% O2, still breathing about 25 times a  minute.  Heart rate is about 116, blood pressure 150/60, chest fairly clear,  she is mildly confused, her abdomen is soft, extremities show no edema now,  CNS shows that she does appear to be mildly confused.   LABORATORIES:  Her lab work this morning pO2 69, pCO2 of 39, pH 7.43 on 100%  O2.   Comprehensive metabolic profile shows sodium is 135, potassium 4.1,  chloride 98, CO2 28, glucose 126, BUN 4, creatinine 1.2, her albumin is 2.3  and white count 20,000, hemoglobin is 11.   ASSESSMENT AND PLAN:  She has got what looks like bilateral infiltrates,  bilateral pleural effusions as well, concern is whether she is developing  acute respiratory distress type syndrome postoperatively.  Dr. Gerda Diss and I  have discussed a thoracentesis but these are bilateral effusions and I am  not certain that we are going to be able to do thoracentesis without having  other problems.  I would like to go ahead and make certain that we are not  developing some sort of problem with a toxic cardiomyopathy as well and  would check BNP  and probably echocardiogram if  that has not been done.  Considering the fact that she has what looks like  bilateral infiltrates and effusions the possibility that there is some  cardiac component must be considered at least.  Thanks for allowing me to  see her with you.      ___________________________________________                                            Oneal Deputy. Juanetta Gosling, M.D.   ELH/MEDQ  D:  01/19/2004  T:  01/19/2004  Job:  [id number]

## 2010-12-20 NOTE — Consult Note (Signed)
NAME:  Jeanne Rogers, Jeanne Rogers                               ACCOUNT NO.:  [id number]   MEDICAL RECORD NO.:  [id number]                  PATIENT TYPE:  PINP   LOCATION:  425                                  FACILITY:   PHYSICIAN:  Donna Bernard, M.D.             DATE OF BIRTH:   DATE OF CONSULTATION:  01/15/2004  DATE OF DISCHARGE:                                   CONSULTATION   CHIEF COMPLAINT:  Cough and low-grade fever.   SUBJECTIVE:  This patient is a 70 year old, white female recently admitted  by Dr. Despina Hidden who underwent a hysterectomy and bladder wall revision.  Since  then, she has developed some complications.  She experienced a GI bleed.  She had been on Mobic via her orthopedist for chronic back pain.  The GI  folks did an upper endoscopy which revealed three ulcers.  Her hemoglobins  remained stable and she has not required transfusion.  She is on IV  Protonix.  The family relates that she has had some ongoing intermittent  mild confusion since the surgery.  She has had an occasional cough, but none  significant.  Chest x-ray today revealed upper lobe infiltrates bilaterally.  Dr. Despina Hidden added Zithromax to the Levaquin which she had been receiving since  the surgery.   OBJECTIVE:  VITAL SIGNS:  Stable with temperature 99.6, blood pressure  138/82, pulse 100.  GENERAL:  The patient is currently alert in no acute distress.  LUNGS:  Right upper lobe crackles auscultated with very mild wheezes  bilaterally with no tachypnea.  NECK:  Supple.  ABDOMEN:  Soft, good bowel sounds.  Some low abdominal tenderness at the  site of the surgery.   Chest x-ray with bilateral upper lobe infiltrates.   CBC with low-grade white blood count elevation.   IMPRESSION:  1. Upper lobe pneumonia.  The patient did require transient intubation     during surgery and apparently the intubation was somewhat difficult.     Therefore, the patient may have aspirated some secretions and we need to     cover  for potential for aspiration pneumonia.  The Levaquin and Zithromax     is certainly in good combination, but would recommend adding clindamycin     in appropriate dose and we will go ahead and do that.  2. Status post recent gastrointestinal bleed.  The patient should definitely     avoid nonsteroidal anti-inflammatory drugs, at least in the near future,     and intravenous Protonix is appropriate.  3. Mild confusion, probably multifactorial including post surgical status     along with potential medication response.  Will monitor.   PLAN:  1. Add clindamycin.  2. Increase Ventolin treatments to q.4h.  3. Other orders as noted on the chart.  4. Will check a blood gas due to periods of desaturation while on lower     levels of O2.  It should be noted the patient is currently on 3 L of     nasal cannula O2.  5. We will continue to follow along with Dr. Despina Hidden.      ___________________________________________                                            Donna Bernard, M.D.   WSL/MEDQ  D:  01/17/2004  T:  01/17/2004  Job:  [id number]

## 2010-12-20 NOTE — Op Note (Signed)
NAME:  Jeanne Rogers, Jeanne Rogers                             ACCOUNT NO.:  [id number]   MEDICAL RECORD NO.:  [id number]                   PATIENT TYPE:  INP   LOCATION:  0344                                 FACILITY:  [facility]   PHYSICIAN:  Lazaro Arms, M.D.                DATE OF BIRTH:  04-22-1941   DATE OF PROCEDURE:  DATE OF DISCHARGE:  01/30/2004                                 OPERATIVE REPORT   The original operative report done from this case could not be located.  It  was dictated originally on 01/11/2004.   PREOPERATIVE DIAGNOSES:  1. Symptomatic pelvic prolapse.  2. Grade 3 cystocele.  3. Urethrocele.  4. Uterine prolapse.   POSTOPERATIVE DIAGNOSES:  1. Symptomatic pelvic prolapse.  2. Grade 3 cystocele.  3. Urethrocele.  4. Uterine prolapse.   PROCEDURE:  1. Vaginal hysterectomy with removal of both tubes and ovaries.  2. Anterior colporrhaphy with placement of a Pelvicol graft.  3. An interspinous vaginal vault suspension.  4. Placement of a retropubic midurethral PelviLace sling.   SURGEON:  Lazaro Arms, M.D.   ANESTHESIA:  General endotracheal   FINDINGS:  The patient was known from the office to have a very large  cystocele, additionally behind the bladder.  Her uterus was prolapsing down.  The only thing that was keeping her uterus in was her significant cystocele.  Of course, the patient was not incontinent preoperatively; but due to the  large cystocele, she also had a urethrocele, and if we were fix the  cystocele without giving some support to the midurethral, I am sure that she  would have stress incontinence postoperatively.  In the office by holding up  on the corners of the bladder and having her strain down there was  significant mobility of the urethra as well.  As a result, I also elected to  do a retropubic sling.   DESCRIPTION OF OPERATION:  The patient was taken to the operating room and  placed in the supine position where she underwent general  endotracheal  anesthesia.  She was placed in the dorsal lithotomy position in the usual  fashion.  She was prepped and draped in the usual sterile fashion for  vaginal and lower abdominal surgery.  A medium-weighted speculum was placed  in the posterior vagina.  The cervix was grasped, 1/2 percent Marcaine with  1:200,000 epinephrine was injected about the vagina, on the lower uterine  segment both anteriorly and posteriorly for both plane delineation and for  hemostasis.  The electrocautery unit was used and the vagina incised in  circumferential fashion.  The anterior and posterior vagina was pushed off  the lower uterine segment.   The posterior cul-de-sac was entered sharply without difficulty.  The  uterosacral ligaments were clamped, cut, suture ligated and held  bilaterally.  The cardinal ligaments were then clamped, cut, and suture  ligated bilaterally  as well.  The anterior vagina was pushed off the lower  uterine segment.  The anterior cul-de-sac was entered without difficulty.  There was significant prolapse.  We stayed very medial.  At this point the  uterine vessels were easily seen after the anterior cul-de-sac was entered.  The anterior and posterior leaves of the peritoneum were plicated and the  uterine vessels were clamped, cut, and suture ligated bilaterally.  Serial  pedicles were taken up the fundus staying inside of the uterine vessels  bilaterally.  Each pedicle being clamped, cut, and suture ligated.  The  uteroovarian ligaments were cross-clamped bilaterally.  The uterus was  removed and sutures were placed for hemostasis.   The infundibulopelvic ligament was then cross-clamped bilaterally and doubly  suture ligated with good hemostasis bilaterally.  The peritoneum was closed  in a purse string fashion and the vagina cuff was closed anterior-to-  posterior in the usual fashion the uterosacral ligaments were cut.  Marcaine  1/2% was then injected anterior into the  vagina in the midline and then  laterally.  The anterior vagina was incised and dissected off the bladder  both sharply and bluntly without difficulty.  She had a great deal of  attenuation of the tissue because it was so stretched out, but the  dissection went well.  The dissection was carried superiorly to the  retropubic area and laterally and inferiorly to the ischial spines  bilaterally.  The Pelvicol graft was shaped and anchored to the sacral  spinous ligaments bilaterally just next to the ischial spine bilaterally  using the [employer] needle driver, self-retrieving needle  driver, #0 Vicryl hub sutures were used.  It was anchored at this point and  was quite secure bilaterally.  I then turned my attention to the retropubic  sling.  Incisions were made on the pubis 2 cm from the midline bilaterally.  The PelviLace system was employed. The trocars were hugged along the pubis  bilaterally.   Cystoscopy was performed with both of the trocars in place; 600-700 cc of  saline was used to distend the bladder and there was no perforation.  The  graft was then placed on the trocars and pulled back through without  difficulty.  It was adjusted very loosely to allow light, you could see  light, between the graft and the urethra. I used a Kelly clamp.  I put it  between the urethra and graft and moved it 90 degrees; and, again it was not  in any way restrictive of the urethra.  I also made sure that the Chevrons  that were in the graft were free and it would not be drawn up further.   I then anchored the posterior portion of the graft to the pubocervical and  pubovesical fascia bilaterally to give additional support.  I made sure that  the top portion of the graft was not, in any way, limiting the bladder neck  from moving  It was cut below the bladder neck so it really did not interfere with the retropubic sling at all.  The vaginal apex was then  anchored to the posterior  portion of the graft bilaterally as an  interspinous vaginal vault suspension.  No excess vaginal had to be removed.  The vagina was closed side-to-side in interrupted fashion and there was  excellent hemostasis.  Three-inch Betadine gauze was placed in the vagina  for postoperative hemostasis.   The patient tolerated the procedure well.  She experienced 200  cc of blood  loss during the procedure.  Her catheter remained in postoperatively.  She  will undergo bladder trial tomorrow.  She will undergo routine postoperative  care.  She received Ancef prophylactically.  All counts were correct and she  will receive Ancef and Levaquin postoperatively because of the grafts.      ___________________________________________                                            Lazaro Arms, M.D.   LHE/MEDQ  D:  02/20/2004  T:  02/20/2004  Job:  [id number]

## 2010-12-20 NOTE — Discharge Summary (Signed)
NAME:  Jeanne Rogers, Jeanne Rogers                             ACCOUNT NO.:  [id number]   MEDICAL RECORD NO.:  [id number]                   PATIENT TYPE:  INP   LOCATION:  0156                                 FACILITY:  [facility]   PHYSICIAN:  Oley Balm. Sung Amabile, M.D. [facility]          DATE OF BIRTH:  [birth year]   DATE OF ADMISSION:  01/19/2004  DATE OF DISCHARGE:  01/30/2004                                 DISCHARGE SUMMARY   DISCHARGE DIAGNOSES:  1. Ventilator dependent respiratory failure with acute respiratory distress     syndrome secondary to aspiration pneumonia.  2. Gastrointestinal bleed with a Mallory-Weiss tear.  3. Ileus.  4. Dilated ureter requiring stent.   HISTORY OF PRESENT ILLNESS:  [name] is a normally healthy 70 year old  white female who underwent elective vaginal hysterectomy and bladder tuck on  January 11, 2004, at [facility].  Subsequently, she developed nausea  and vomiting postop and developed a Mallory-Weiss tear requiring GI consult.  Followup chest x-ray demonstrated apical pneumonia, has developed fevers and  increasing shortness of breath.  Subsequently, she became O2 dependent, had  a general decline in health and respiratory status.  She was treated with  antimicrobial therapy consisting of Keflex, Levaquin, Zithromax and  clindamycin.  No micro data was positive.  She had a continued general  decline in her pulmonary status and generalized failure to thrive.  A CT  scan of the chest on January 19, 2004, did not demonstrate PE but did show  moderate bilateral effusion, bilateral airspace disease.  She continued to  require orotracheal intubation with a #6 orotracheal tube on day of transfer  to [facility] on January 19, 2004, and was transferred to [facility] in Room 156; pulmonary critical care was asked to assume role as  primary care.   LABORATORY DATA:  Arterial blood gases on __________% CPAP, 5 of PEEP, 5 of  pressure support:  A pH of 7.42,  pCO2 of 44, and pO2 of 88.5.  WBC was 8.7,  hemoglobin 10.3, hematocrit 30.3, platelets were 848.  INR was 1.2, PTT was  36, sodium 135, potassium 4.2, chloride 101, CO2 was 26, glucose 93.  BUN  16, creatinine 1.1, calcium 8.7.  AST is 31, ALT is 21, ALP is 75, total  bilirubin 0.9.  Magnesium 2.0, phosphorus 2.0, troponin-I is 0.05,  CK is  37, CK MB 0.7.  Cholesterol 142, triglycerides 145.  TSH is 0.477.  Vancomycin trough was 21.0.  Blood cultures were negative x2.  Urine culture  demonstrated no growth.  Respiratory culture showed nonpathogenic normal  flora.  Chest x-ray shows persistent with bilateral pneumonia, upper lobe  predominant.  CT of the chest is no acute pulmonary embolism, mild bilateral  hilar adenopathy and bilateral airspace disease.  CT of the abdomen showed  moderate to marked left hydronephrosis, moderate left hydroureter with  progression.  HOSPITAL COURSE BY DISCHARGE DIAGNOSIS:  1. Vent dependent respiratory failure with acute respiratory distress     syndrome with a suspected aspiration pneumonia.  [name] was admitted to     [facility], treated with usual interventions of mechanical and     ventilatory support with a low stretch approach.  She was also placed on     vancomycin and Zosyn and pancultured, although those cultures were     negative.  She responded well to treatment and was successfully weaned     from mechanical and ventilatory support within three days.  She was     transferred to the floor and subsequently transferred home without     requiring oxygen as an outpatient.  2. GI bleed with a Mallory-Weiss tear.  This was treated initially at [facility].  She will be maintained on proton pump inhibitors on an     outpatient basis.  3. Ileus.  Ileus responded to MiraLax and required no further interventions.  4. Dilated ureter.  This was noted at [facility] originally     following her vaginal hysterectomy and  bladder tuck.  She developed back     pain while on the floor at [facility].  A repeat CT scan was     obtained which demonstrated progression of ureter dilatation.  A urology     consult was obtained with Dr. Annabell Howells, a stent was placed in the OR by him.     She responded well to that treatment and she will be followed on an     outpatient basis by Dr. Annabell Howells.   DISCHARGE MEDICATIONS:  1. Protonix 40 mg a day x90 days and then stop.  2. Aspirin 81 mg a day.   DIET:  As tolerated.   SPECIAL INSTRUCTIONS:  As per Dr. Annabell Howells.   FOLLOW UP:  With Dr. Despina Hidden when needed.  There will be no need to followup  with pulmonary critical care service at this time.   DISPOSITION/CONDITION ON DISCHARGE:  Improved.     Brett Canales Minor, A.C.N.P. [facility]                 Oley Balm. Sung Amabile, M.D. [facility]    SM/MEDQ  D:  02/08/2004  T:  02/08/2004  Job:  [id number]   cc:   Lazaro Arms, M.D.  [address]  [place]  [place] [address]  Fax: [phone]   Francoise Schaumann. Halm, D.O.  [address]  [place]  [place] [address]  Fax: [phone]   Oneal Deputy. Juanetta Gosling, M.D.  [address]  [place]  [place] [address]  Fax: [phone]   Rollene Rotunda, M.D.   Scott A. Gerda Diss, M.D.  [address]  [place]  [place] [address]  Fax: [phone]   Excell Seltzer. Annabell Howells, M.D.  [address]  [place]  [place] [address]  Fax: [phone]

## 2010-12-20 NOTE — Consult Note (Signed)
NAME:  Jeanne Rogers, Jeanne Rogers                             ACCOUNT NO.:  [id number]   MEDICAL RECORD NO.:  [id number]                   PATIENT TYPE:  INP   LOCATION:  0344                                 FACILITY:  [facility]   PHYSICIAN:  Excell Seltzer. Annabell Howells, M.D.                 DATE OF BIRTH:  [birth year]   DATE OF CONSULTATION:  01/29/2004  DATE OF DISCHARGE:                                   CONSULTATION   Briefly, Jeanne Rogers is a 70 year old white female who underwent a vaginal  hysterectomy, bilateral salpingo-oophorectomy, and urethral sling.  Postoperatively, she developed nausea and vomiting with a Mallory-Weiss tear  with probable aspiration pneumonia, on January 11, 2004. She was transferred to  [facility] on January 19, 2004, for more intensive medical care. During this  hospitalization she was found to have left hydronephrosis on CT scan. Follow-  up studies revealed progression of this finding. She has had some left flank  pain. Medical history is significant for prior bilateral pneumothoraces in  1996. She has allergies to Methergine.   Her medications include Protonix, MiraLax, and Augmentin.   SOCIAL HISTORY:  She lives in [place] with her husband. Her  daughter is an ER physician at [employer].   FAMILY HISTORY:  Significant for congestive heart failure.   REVIEW OF SYSTEMS:  She has had persistent left-sided flank pain. She has  been some question of her ability to void, but she has had a Foley in pretty  much since surgery and has not really been given an adequate voiding trial.  Otherwise, she is without complaints.   PHYSICAL EXAMINATION:  GENERAL: She is a well-developed, well-nourished  white female in no acute distress, alert and oriented times three.  VITAL SIGNS: Temperature 98.9, blood pressure 112/51.  ABDOMEN: Soft and flat with some left-sided tenderness.  GU: Normal external genitalia. Urethral meatus is unremarkable. She does not  appear to have over correction of  the urethra from a recent sling. There are  well healed suprapubic puncture sites from probable Spark or TVT sling. A  complete vaginal exam was not performed.   I have reviewed her CT scan. There are some postoperative changes in the  pelvis with dilatation of the ureter down to the left pelvic region. The  sling is actually visible in the mid urethral level.   IMPRESSION:  Postoperative left ureteral obstruction with complicated  medical history.   PLAN:  She will need to undergo cystoscopy with left retrograde pyelogram  and attempted stent insertion with possible ureteroscopy.  Other risks of  this procedure were explained to her daughter and husband in detail.  Excell Seltzer. Annabell Howells, M.D.    JJW/MEDQ  D:  01/29/2004  T:  01/29/2004  Job:  [id number]   cc:   Rollene Rotunda, M.D.   Scott A. Gerda Diss, M.D.  [address]  [place]  [place] [address]  Fax: [phone]

## 2010-12-20 NOTE — Procedures (Signed)
NAME:  MIHO, MONDA                             ACCOUNT NO.:  [id number]   MEDICAL RECORD NO.:  [id number]                   PATIENT TYPE:  INP   LOCATION:  0156                                 FACILITY:  [facility]   PHYSICIAN:  Edward L. Juanetta Gosling, M.D.             DATE OF BIRTH:  May 10, 1941   DATE OF PROCEDURE:  DATE OF DISCHARGE:                                EKG INTERPRETATION   TIME:  January 19, 2004, 13:34 42   FINDINGS:  1. The rhythm is sinus tachycardia.  2. There is probable left atrial enlargement.  3. There are T-wave abnormalities which are nonspecific.   IMPRESSION:  Abnormal electrocardiogram.      ___________________________________________                                            Oneal Deputy. Juanetta Gosling, M.D.   Gwenlyn Found  D:  01/22/2004  T:  01/22/2004  Job:  [id number]

## 2010-12-20 NOTE — Procedures (Signed)
NAME:  Jeanne Rogers, Jeanne Rogers                             ACCOUNT NO.:  [id number]   MEDICAL RECORD NO.:  [id number]                   PATIENT TYPE:  INP   LOCATION:  0156                                 FACILITY:  [facility]   PHYSICIAN:  Edward L. Juanetta Gosling, M.D.             DATE OF BIRTH:  Aug 28, 1940   DATE OF PROCEDURE:  DATE OF DISCHARGE:                                EKG INTERPRETATION   TIME:  January 19, 2004, at 13:31   FINDINGS:  1. The rhythm is sinus tachycardia with a rate of about 115.  2. Probable left atrial enlargement is seen.  3. There are ST-T wave abnormalities which could indicate ischemia, but are     basically nonspecific.   IMPRESSION:  Abnormal electrocardiogram.      ___________________________________________                                            Oneal Deputy. Juanetta Gosling, M.D.   Gwenlyn Found  D:  01/22/2004  T:  01/22/2004  Job:  [id number]

## 2010-12-20 NOTE — Consult Note (Signed)
NAME:  Jeanne Rogers, ABATE                             ACCOUNT NO.:  [id number]   MEDICAL RECORD NO.:  [id number]                   PATIENT TYPE:  INP   LOCATION:  0156                                 FACILITY:  [facility]   PHYSICIAN:  Rollene Rotunda, M.D.                DATE OF BIRTH:  [birth year]   DATE OF CONSULTATION:  01/19/2004  DATE OF DISCHARGE:                                   CONSULTATION   PRIMARY CARE PHYSICIAN:  Dr. Lubertha South.   REASON FOR CONSULTATION:  To evaluate with nonsustained ventricular  tachycardia.   HISTORY OF PRESENT ILLNESS:  Patient is a 70 year old who is admitted to  [facility] on June 9.  She had a total vaginal hysterectomy, BSO, and pelvic  suspension.  She did develop Mallory-Weiss tear with an upper GI bleed after  experiencing some nausea and vomiting.  She subsequently developed probable  aspiration and pneumonia with bilateral upper lobe infiltrates.  She  subsequently had respiratory distress with probable ARDS.  During the course  of her dyspnea, she had a 10-beat run of nonsustained ventricular  tachycardia followed by another five-beat run.  Following intubation, she  has had a normal sinus rhythm.  Patient has had no prior cardiac history.  She has been very active.  Her family reports that she has never had any  chest discomfort, neck discomfort, or arm discomfort.  At the time of  activity, there is no nausea, vomiting, excessive diaphoresis.  She has  never had any previous complaints of palpitations and has had no presyncope  or syncope.  She has never had any respiratory difficulties, and her family  has never noted any PND or orthopnea.   PAST MEDICAL HISTORY:  1. Bilateral pneumothorax in 1996.  2. Stress urinary incontinence.   ALLERGIES:  METHERGINE.   HOSPITAL MEDICATIONS:  Zithromax, clindamycin, Diflucan, Xopenex, Levaquin,  Solu-Medrol, Reglan, scopolamine, Lasix.   SOCIAL HISTORY:  The patient lives in [place] with her  husband.  She is married and has four children.  Her daughter is at her bedside and is  an ER physician.   FAMILY HISTORY:  Contributory for a mother with a history of congestive  heart failure and sudden cardiac death.   REVIEW OF SYSTEMS:  Otherwise as stated in the HPI, per family.  Negative  for other systems.   PHYSICAL EXAMINATION:  VITAL SIGNS:  Blood pressure 120/80, heart rate 70  and regular, afebrile.  GENERAL:  Patient is in no distress.  She is intubated.  HEENT:  Unable to appreciate, intubated.  NECK:  No bruits.  No thyromegaly.  LUNGS:  Clear to auscultation bilaterally.  CHEST:  Unremarkable.  HEART:  PMI not displaced or sustained.  S1 and S2 within normal limits.  No  S3, S4, or murmurs.  ABDOMEN:  Flat.  Positive bowel sounds.  No hepatomegaly.  Patient is  sedated, so not able to assess rebound or guarding.  EXTREMITIES:  Pulses 2+ throughout.  No clubbing, cyanosis or edema.  No  bruits.  NEUROLOGIC:  Intubated, sedated.   EKG:  Sinus rhythm.  Axis within normal limits.  Intervals within normal  limits.  No acute ST/T wave changes.  On January 09, 2004.  A followup ordered.   ASSESSMENT/PLAN:  Nonsustained ventricular tachycardia:  The patient had  nonsustained ventricular tachycardia during respiratory failure.  There is  no evidence of a primary cardiac cause for her complaints and no prior  cardiac history.  Her vascular exam is unremarkable.  An echocardiogram has  been performed, but the results are not available at this point.  At this  point, I would suggest following and ruling out a myocardial infarction.  It  would be reasonable to check a BNP level, which may help in excluding  pulmonary edema as a component of this; however, I think it is very likely  this is all respiratory, and that she will not be bothered by arrhythmias  when she is hemodynamically and otherwise stable.                                               Rollene Rotunda, M.D.     JH/MEDQ  D:  01/19/2004  T:  01/20/2004  Job:  [id number]   cc:   Donna Bernard, M.D.  [address]  [place]  [place] [address]  Fax: [phone]

## 2011-06-19 ENCOUNTER — Other Ambulatory Visit: Payer: Self-pay | Admitting: Family Medicine

## 2011-06-19 DIAGNOSIS — Z139 Encounter for screening, unspecified: Secondary | ICD-10-CM

## 2011-06-30 ENCOUNTER — Ambulatory Visit (HOSPITAL_COMMUNITY)
Admission: RE | Admit: 2011-06-30 | Discharge: 2011-06-30 | Disposition: A | Discharge status: Home / Self Care | Payer: Medicare Other | Source: Ambulatory Visit | Attending: Family Medicine | Admitting: Family Medicine

## 2011-06-30 DIAGNOSIS — Z1231 Encounter for screening mammogram for malignant neoplasm of breast: Secondary | ICD-10-CM | POA: Insufficient documentation

## 2011-06-30 DIAGNOSIS — Z139 Encounter for screening, unspecified: Secondary | ICD-10-CM

## 2011-08-13 ENCOUNTER — Other Ambulatory Visit: Payer: Self-pay | Admitting: Family Medicine

## 2011-08-13 DIAGNOSIS — H18419 Arcus senilis, unspecified eye: Secondary | ICD-10-CM | POA: Diagnosis not present

## 2011-08-13 DIAGNOSIS — H539 Unspecified visual disturbance: Secondary | ICD-10-CM | POA: Diagnosis not present

## 2011-08-13 DIAGNOSIS — I69998 Other sequelae following unspecified cerebrovascular disease: Secondary | ICD-10-CM | POA: Diagnosis not present

## 2011-08-13 DIAGNOSIS — H2510 Age-related nuclear cataract, unspecified eye: Secondary | ICD-10-CM | POA: Diagnosis not present

## 2011-08-13 DIAGNOSIS — H43819 Vitreous degeneration, unspecified eye: Secondary | ICD-10-CM | POA: Diagnosis not present

## 2011-08-13 DIAGNOSIS — G43809 Other migraine, not intractable, without status migrainosus: Secondary | ICD-10-CM | POA: Diagnosis not present

## 2011-08-14 ENCOUNTER — Ambulatory Visit (HOSPITAL_COMMUNITY)
Admission: RE | Admit: 2011-08-14 | Discharge: 2011-08-14 | Disposition: A | Discharge status: Home / Self Care | Payer: Medicare Other | Source: Ambulatory Visit | Attending: Family Medicine | Admitting: Family Medicine

## 2011-08-14 DIAGNOSIS — H538 Other visual disturbances: Secondary | ICD-10-CM | POA: Diagnosis not present

## 2011-08-14 DIAGNOSIS — H539 Unspecified visual disturbance: Secondary | ICD-10-CM

## 2011-08-14 DIAGNOSIS — I658 Occlusion and stenosis of other precerebral arteries: Secondary | ICD-10-CM | POA: Diagnosis not present

## 2011-08-14 DIAGNOSIS — G459 Transient cerebral ischemic attack, unspecified: Secondary | ICD-10-CM | POA: Diagnosis not present

## 2011-08-14 DIAGNOSIS — I6529 Occlusion and stenosis of unspecified carotid artery: Secondary | ICD-10-CM | POA: Diagnosis not present

## 2011-08-21 DIAGNOSIS — E785 Hyperlipidemia, unspecified: Secondary | ICD-10-CM | POA: Diagnosis not present

## 2011-09-09 DIAGNOSIS — L570 Actinic keratosis: Secondary | ICD-10-CM | POA: Diagnosis not present

## 2012-04-23 DIAGNOSIS — Z23 Encounter for immunization: Secondary | ICD-10-CM | POA: Diagnosis not present

## 2012-06-16 ENCOUNTER — Other Ambulatory Visit: Payer: Self-pay | Admitting: Family Medicine

## 2012-06-16 DIAGNOSIS — Z139 Encounter for screening, unspecified: Secondary | ICD-10-CM

## 2012-07-06 ENCOUNTER — Ambulatory Visit (HOSPITAL_COMMUNITY): Payer: Medicare Other

## 2012-07-06 DIAGNOSIS — E782 Mixed hyperlipidemia: Secondary | ICD-10-CM | POA: Diagnosis not present

## 2012-07-06 DIAGNOSIS — Z79899 Other long term (current) drug therapy: Secondary | ICD-10-CM | POA: Diagnosis not present

## 2012-07-06 DIAGNOSIS — M899 Disorder of bone, unspecified: Secondary | ICD-10-CM | POA: Diagnosis not present

## 2012-07-08 ENCOUNTER — Ambulatory Visit (HOSPITAL_COMMUNITY)
Admission: RE | Admit: 2012-07-08 | Discharge: 2012-07-08 | Disposition: A | Discharge status: Home / Self Care | Payer: Medicare Other | Source: Ambulatory Visit | Attending: Family Medicine | Admitting: Family Medicine

## 2012-07-08 DIAGNOSIS — Z1231 Encounter for screening mammogram for malignant neoplasm of breast: Secondary | ICD-10-CM | POA: Insufficient documentation

## 2012-07-08 DIAGNOSIS — Z139 Encounter for screening, unspecified: Secondary | ICD-10-CM

## 2012-07-19 DIAGNOSIS — Z0000 Encounter for general adult medical examination without abnormal findings: Secondary | ICD-10-CM | POA: Diagnosis not present

## 2012-07-22 DIAGNOSIS — L570 Actinic keratosis: Secondary | ICD-10-CM | POA: Diagnosis not present

## 2012-07-22 DIAGNOSIS — Z85820 Personal history of malignant melanoma of skin: Secondary | ICD-10-CM | POA: Diagnosis not present

## 2012-07-22 DIAGNOSIS — B079 Viral wart, unspecified: Secondary | ICD-10-CM | POA: Diagnosis not present

## 2012-07-22 DIAGNOSIS — D235 Other benign neoplasm of skin of trunk: Secondary | ICD-10-CM | POA: Diagnosis not present

## 2013-04-27 DIAGNOSIS — Z23 Encounter for immunization: Secondary | ICD-10-CM | POA: Diagnosis not present

## 2013-05-23 ENCOUNTER — Ambulatory Visit (INDEPENDENT_AMBULATORY_CARE_PROVIDER_SITE_OTHER): Payer: Medicare Other | Admitting: Family Medicine

## 2013-05-23 ENCOUNTER — Encounter: Payer: Self-pay | Admitting: Family Medicine

## 2013-05-23 VITALS — BP 128/82 | Ht 64.0 in | Wt 138.0 lb

## 2013-05-23 DIAGNOSIS — M545 Low back pain: Secondary | ICD-10-CM | POA: Diagnosis not present

## 2013-05-23 MED ORDER — MELOXICAM 15 MG PO TABS: 15.0000 mg | ORAL_TABLET | Freq: Every day | ORAL | Status: DC

## 2013-05-23 NOTE — Progress Notes (Signed)
  Subjective:    Patient ID: Jeanne Rogers, female    DOB: 01-06-1941, 72 y.o.   MRN: [id number]  Back Pain This is a new problem. The current episode started in the past 7 days. The pain is present in the sacro-iliac. The quality of the pain is described as shooting. Worse during: worse first thing in the morning. Exacerbated by: Getting out of bed. Stiffness is present in the morning. Treatments tried: Flexeril  The treatment provided mild relief.   She last hurt her back 9 years ago and was prescribed Naproxen. She does not want that med again b/c it gave her high BP.   Worse with getting up out  Of bed PMH benign  Review of Systems  Musculoskeletal: Positive for back pain.   denies any abdominal pain rectal bleeding.     Objective:   Physical Exam Lungs clear heart regular abdomen soft low back moderate tenderness in the left SI joint negative straight leg raise fair range of motion       Assessment & Plan:  Back strain-exercises shown anti-inflammatory prescribed warning signs what medication discussed. Followup if ongoing trouble may need physical therapy I don't feel the patient needs any x-rays or MRI currently.

## 2013-06-29 ENCOUNTER — Ambulatory Visit (HOSPITAL_COMMUNITY)
Admission: RE | Admit: 2013-06-29 | Discharge: 2013-06-29 | Disposition: A | Discharge status: Home / Self Care | Payer: Medicare Other | Source: Ambulatory Visit | Attending: Family Medicine | Admitting: Family Medicine

## 2013-06-29 ENCOUNTER — Telehealth: Payer: Self-pay | Admitting: Family Medicine

## 2013-06-29 ENCOUNTER — Ambulatory Visit (INDEPENDENT_AMBULATORY_CARE_PROVIDER_SITE_OTHER): Payer: Medicare Other | Admitting: Family Medicine

## 2013-06-29 ENCOUNTER — Encounter: Payer: Self-pay | Admitting: Family Medicine

## 2013-06-29 ENCOUNTER — Other Ambulatory Visit: Payer: Self-pay | Admitting: Family Medicine

## 2013-06-29 VITALS — BP 120/70 | Ht 64.0 in | Wt 138.2 lb

## 2013-06-29 DIAGNOSIS — M549 Dorsalgia, unspecified: Secondary | ICD-10-CM

## 2013-06-29 DIAGNOSIS — K8020 Calculus of gallbladder without cholecystitis without obstruction: Secondary | ICD-10-CM | POA: Insufficient documentation

## 2013-06-29 DIAGNOSIS — IMO0002 Reserved for concepts with insufficient information to code with codable children: Secondary | ICD-10-CM | POA: Insufficient documentation

## 2013-06-29 DIAGNOSIS — M810 Age-related osteoporosis without current pathological fracture: Secondary | ICD-10-CM

## 2013-06-29 DIAGNOSIS — M546 Pain in thoracic spine: Secondary | ICD-10-CM | POA: Diagnosis not present

## 2013-06-29 DIAGNOSIS — M5146 Schmorl's nodes, lumbar region: Secondary | ICD-10-CM | POA: Insufficient documentation

## 2013-06-29 DIAGNOSIS — M539 Dorsopathy, unspecified: Secondary | ICD-10-CM | POA: Diagnosis not present

## 2013-06-29 DIAGNOSIS — E782 Mixed hyperlipidemia: Secondary | ICD-10-CM

## 2013-06-29 DIAGNOSIS — X58XXXA Exposure to other specified factors, initial encounter: Secondary | ICD-10-CM | POA: Insufficient documentation

## 2013-06-29 DIAGNOSIS — M47817 Spondylosis without myelopathy or radiculopathy, lumbosacral region: Secondary | ICD-10-CM | POA: Diagnosis not present

## 2013-06-29 DIAGNOSIS — Z79899 Other long term (current) drug therapy: Secondary | ICD-10-CM

## 2013-06-29 DIAGNOSIS — M545 Low back pain, unspecified: Secondary | ICD-10-CM | POA: Diagnosis not present

## 2013-06-29 DIAGNOSIS — E559 Vitamin D deficiency, unspecified: Secondary | ICD-10-CM

## 2013-06-29 DIAGNOSIS — M8580 Other specified disorders of bone density and structure, unspecified site: Secondary | ICD-10-CM

## 2013-06-29 DIAGNOSIS — M439 Deforming dorsopathy, unspecified: Secondary | ICD-10-CM | POA: Insufficient documentation

## 2013-06-29 MED ORDER — TRAMADOL HCL 50 MG PO TABS: 50.0000 mg | ORAL_TABLET | Freq: Four times a day (QID) | ORAL | Status: DC | PRN

## 2013-06-29 NOTE — Progress Notes (Signed)
   Subjective:    Patient ID: Jeanne Rogers, female    DOB: 04-21-1941, 72 y.o.   MRN: [id number]  Back Pain This is a recurrent problem. The current episode started 1 to 4 weeks ago. The problem occurs constantly. The problem is unchanged. The pain is present in the lumbar spine and thoracic spine. The quality of the pain is described as aching. The pain does not radiate. The pain is at a severity of 5/10. The pain is moderate. The pain is the same all the time. Stiffness is present all day. She has tried NSAIDs for the symptoms. The treatment provided no relief.   She was seen several weeks ago placed on Mobic it caused constipation so now she is using ibuprofen she relates pain and discomfort especially with movement she lays on her right side in the bed when she rolls over the left side which causes severe pain makes it difficult for her to go back to sleep she also states it hurts more with flexing forward not as much with extending backwards she denies any injury. She feels overall she is not doing any better. She denies any fevers weight loss no personal history of any type of cancers She has history of osteoporosis last bone density 2011 Review of Systems  Musculoskeletal: Positive for back pain.       Objective:   Physical Exam Lungs are clear hearts regular pulse normal moderate midthoracic and lower back tenderness. Fair range of motion negative straight leg raise      Assessment & Plan:  Midthoracic and lower back pain-ibuprofen may take to 3 times daily as necessary tramadol in the evening times as necessary cautioned drowsiness x-rays are ordered. Also bone density. If the x-rays do not show any structural problems then physical therapy probable twice a week for the next 3-4 weeks with followup office visit in 3 weeks. Warning signs were discussed. If progressive problems severe pain call.

## 2013-06-29 NOTE — Telephone Encounter (Signed)
Patient needs BW paperwork °

## 2013-07-01 ENCOUNTER — Other Ambulatory Visit: Payer: Self-pay | Admitting: *Deleted

## 2013-07-01 DIAGNOSIS — M549 Dorsalgia, unspecified: Secondary | ICD-10-CM

## 2013-07-01 DIAGNOSIS — M810 Age-related osteoporosis without current pathological fracture: Secondary | ICD-10-CM

## 2013-07-01 NOTE — Telephone Encounter (Signed)
m7 lip vit D (osteoporosis, hx low vit D)

## 2013-07-01 NOTE — Telephone Encounter (Signed)
Blood Work ordered in EPIC. Patient notified 

## 2013-07-04 ENCOUNTER — Other Ambulatory Visit: Payer: Self-pay | Admitting: Family Medicine

## 2013-07-04 DIAGNOSIS — Z139 Encounter for screening, unspecified: Secondary | ICD-10-CM

## 2013-07-06 ENCOUNTER — Other Ambulatory Visit: Payer: Self-pay | Admitting: Family Medicine

## 2013-07-06 ENCOUNTER — Ambulatory Visit (HOSPITAL_COMMUNITY)
Admission: RE | Admit: 2013-07-06 | Discharge: 2013-07-06 | Disposition: A | Discharge status: Home / Self Care | Payer: Medicare Other | Source: Ambulatory Visit | Attending: Family Medicine | Admitting: Family Medicine

## 2013-07-06 DIAGNOSIS — M5126 Other intervertebral disc displacement, lumbar region: Secondary | ICD-10-CM | POA: Diagnosis not present

## 2013-07-06 DIAGNOSIS — M545 Low back pain, unspecified: Secondary | ICD-10-CM | POA: Insufficient documentation

## 2013-07-06 DIAGNOSIS — K449 Diaphragmatic hernia without obstruction or gangrene: Secondary | ICD-10-CM | POA: Diagnosis not present

## 2013-07-06 DIAGNOSIS — M549 Dorsalgia, unspecified: Secondary | ICD-10-CM

## 2013-07-06 DIAGNOSIS — K8020 Calculus of gallbladder without cholecystitis without obstruction: Secondary | ICD-10-CM | POA: Insufficient documentation

## 2013-07-06 DIAGNOSIS — M899 Disorder of bone, unspecified: Secondary | ICD-10-CM | POA: Diagnosis not present

## 2013-07-06 DIAGNOSIS — Z780 Asymptomatic menopausal state: Secondary | ICD-10-CM | POA: Insufficient documentation

## 2013-07-07 DIAGNOSIS — E782 Mixed hyperlipidemia: Secondary | ICD-10-CM | POA: Diagnosis not present

## 2013-07-07 DIAGNOSIS — M810 Age-related osteoporosis without current pathological fracture: Secondary | ICD-10-CM | POA: Diagnosis not present

## 2013-07-07 DIAGNOSIS — Z79899 Other long term (current) drug therapy: Secondary | ICD-10-CM | POA: Diagnosis not present

## 2013-07-07 LAB — BASIC METABOLIC PANEL
BUN: 13 mg/dL (ref 6–23)
Creat: 0.92 mg/dL (ref 0.50–1.10)
Potassium: 4.5 mEq/L (ref 3.5–5.3)

## 2013-07-07 LAB — LIPID PANEL
Cholesterol: 236 mg/dL — ABNORMAL HIGH (ref 0–200)
HDL: 68 mg/dL (ref >39)
Triglycerides: 69 mg/dL (ref <150)

## 2013-07-08 ENCOUNTER — Telehealth: Payer: Self-pay | Admitting: Family Medicine

## 2013-07-08 NOTE — Telephone Encounter (Signed)
Dr. Lorin Picket discussed results with patient today.

## 2013-07-08 NOTE — Telephone Encounter (Signed)
Patient had labs done this week and is calling for the results.

## 2013-07-12 ENCOUNTER — Ambulatory Visit (HOSPITAL_COMMUNITY)
Admission: RE | Admit: 2013-07-12 | Discharge: 2013-07-12 | Disposition: A | Discharge status: Home / Self Care | Payer: Medicare Other | Source: Ambulatory Visit | Attending: Family Medicine | Admitting: Family Medicine

## 2013-07-12 DIAGNOSIS — Z1231 Encounter for screening mammogram for malignant neoplasm of breast: Secondary | ICD-10-CM | POA: Insufficient documentation

## 2013-07-12 DIAGNOSIS — Z139 Encounter for screening, unspecified: Secondary | ICD-10-CM

## 2013-07-18 ENCOUNTER — Other Ambulatory Visit: Payer: Self-pay | Admitting: Family Medicine

## 2013-07-18 DIAGNOSIS — R928 Other abnormal and inconclusive findings on diagnostic imaging of breast: Secondary | ICD-10-CM

## 2013-07-24 ENCOUNTER — Encounter: Payer: Self-pay | Admitting: *Deleted

## 2013-07-25 ENCOUNTER — Other Ambulatory Visit: Payer: Self-pay | Admitting: Family Medicine

## 2013-07-25 ENCOUNTER — Encounter: Payer: Self-pay | Admitting: Family Medicine

## 2013-07-25 ENCOUNTER — Telehealth: Payer: Self-pay | Admitting: Family Medicine

## 2013-07-25 ENCOUNTER — Ambulatory Visit (INDEPENDENT_AMBULATORY_CARE_PROVIDER_SITE_OTHER): Payer: Medicare Other | Admitting: Family Medicine

## 2013-07-25 VITALS — BP 142/88 | Ht 64.0 in | Wt 133.0 lb

## 2013-07-25 DIAGNOSIS — IMO0002 Reserved for concepts with insufficient information to code with codable children: Secondary | ICD-10-CM

## 2013-07-25 DIAGNOSIS — M810 Age-related osteoporosis without current pathological fracture: Secondary | ICD-10-CM

## 2013-07-25 DIAGNOSIS — M549 Dorsalgia, unspecified: Secondary | ICD-10-CM

## 2013-07-25 DIAGNOSIS — E559 Vitamin D deficiency, unspecified: Secondary | ICD-10-CM | POA: Diagnosis not present

## 2013-07-25 DIAGNOSIS — R21 Rash and other nonspecific skin eruption: Secondary | ICD-10-CM | POA: Diagnosis not present

## 2013-07-25 DIAGNOSIS — L570 Actinic keratosis: Secondary | ICD-10-CM | POA: Diagnosis not present

## 2013-07-25 DIAGNOSIS — B079 Viral wart, unspecified: Secondary | ICD-10-CM | POA: Diagnosis not present

## 2013-07-25 DIAGNOSIS — D235 Other benign neoplasm of skin of trunk: Secondary | ICD-10-CM | POA: Diagnosis not present

## 2013-07-25 DIAGNOSIS — L820 Inflamed seborrheic keratosis: Secondary | ICD-10-CM | POA: Diagnosis not present

## 2013-07-25 MED ORDER — CHOLECALCIFEROL 125 MCG (5000 UT) PO CAPS: 5000.0000 [IU] | ORAL_CAPSULE | Freq: Every day | ORAL | Status: DC

## 2013-07-25 MED ORDER — TRAMADOL HCL 50 MG PO TABS: 50.0000 mg | ORAL_TABLET | Freq: Four times a day (QID) | ORAL | Status: DC | PRN

## 2013-07-25 MED ORDER — CLOTRIMAZOLE-BETAMETHASONE 1-0.05 % EX CREA: 1.0000 "application " | TOPICAL_CREAM | Freq: Two times a day (BID) | CUTANEOUS | Status: DC

## 2013-07-25 NOTE — Telephone Encounter (Signed)
Patient says that the pharmacy does not have the 2 Rx's that were supposed to be called in .Marland Kitchen Fosamax and Tramadol    [facility]

## 2013-07-25 NOTE — Progress Notes (Signed)
   Subjective:    Patient ID: Jeanne Rogers, female    DOB: 06/10/1941, 72 y.o.   MRN: [id number]  HPIHere for a med check. Had bloodwork on 07/07/13.  Follow up on back pain. Taking tramadol. Has had xray and CT scan.   Yeast under left breast. Taking ketoconazole cream not helping. Still pruritic.  History of osteopenia in the past. Admits to plus minus compliance with the Fosamax. Also plus minus compliance with vitamin D. and calcium supplementation.  Reports low back pain has improved though still considerable.    Review of Systems No chest pain no abdominal pain no change in bowel habits no blood in stool ROS otherwise negative    Objective:   Physical Exam Alert no acute distress. Intertrigo rash beneath left breast somewhat right breast lungs clear heart regular in rhythm low back tender to percussion negative straight leg raise abdominal exam benign       Assessment & Plan:  #1 impression compression fracture discussed at length. #2 osteoporosis bone density reveals this. #3 intertrigo rash likely yeast component. Plan 35 minutes spent most in discussion. Resume Fosamax faithfully. Maintain 2 times daily. Add vitamin D 5000 milliunits daily. Exercise encourage. Nystatin cream 4 times a day to affected area. Easily 35 minutes spent most in discussion and evaluation. WSL

## 2013-07-25 NOTE — Telephone Encounter (Signed)
Pharmacy already has Rxs. Patient notified.

## 2013-07-26 DIAGNOSIS — IMO0002 Reserved for concepts with insufficient information to code with codable children: Secondary | ICD-10-CM | POA: Insufficient documentation

## 2013-07-26 DIAGNOSIS — E559 Vitamin D deficiency, unspecified: Secondary | ICD-10-CM | POA: Insufficient documentation

## 2013-08-10 ENCOUNTER — Ambulatory Visit (HOSPITAL_COMMUNITY)
Admission: RE | Admit: 2013-08-10 | Discharge: 2013-08-10 | Disposition: A | Discharge status: Home / Self Care | Payer: Medicare Other | Source: Ambulatory Visit | Attending: Family Medicine | Admitting: Family Medicine

## 2013-08-10 DIAGNOSIS — R928 Other abnormal and inconclusive findings on diagnostic imaging of breast: Secondary | ICD-10-CM | POA: Insufficient documentation

## 2013-09-27 ENCOUNTER — Ambulatory Visit (INDEPENDENT_AMBULATORY_CARE_PROVIDER_SITE_OTHER): Payer: Medicare Other | Admitting: Family Medicine

## 2013-09-27 ENCOUNTER — Encounter: Payer: Self-pay | Admitting: Family Medicine

## 2013-09-27 VITALS — BP 132/80 | Temp 97.9°F | Ht 64.0 in | Wt 134.0 lb

## 2013-09-27 DIAGNOSIS — R300 Dysuria: Secondary | ICD-10-CM

## 2013-09-27 DIAGNOSIS — N390 Urinary tract infection, site not specified: Secondary | ICD-10-CM

## 2013-09-27 LAB — POCT URINALYSIS DIPSTICK
PH UA: 5
SPEC GRAV UA: 1.01

## 2013-09-27 MED ORDER — CIPROFLOXACIN HCL 250 MG PO TABS: 250.0000 mg | ORAL_TABLET | Freq: Two times a day (BID) | ORAL | Status: DC

## 2013-09-27 NOTE — Progress Notes (Signed)
   Subjective:    Patient ID: Jeanne Rogers, female    DOB: Jul 11, 1941, 73 y.o.   MRN: [id number]  Dysuria  This is a new problem. The current episode started yesterday. Associated symptoms include frequency. Associated symptoms comments: abd pain . Treatments tried: azo.    Low abd cramping  No fever, no quezazziness  No vomiting no fever no chills no back pain  No change in bowel habits  Review of Systems  Genitourinary: Positive for dysuria and frequency.   no rash ROS otherwise negative     Objective:   Physical Exam  Alert no acute distress. Lungs clear. Heart regular in rhythm. No CVA tenderness. Abdomen good bowel sounds no rebound no guarding mild suprapubic tenderness.  Urinalysis 2-3 white blood cells per high-power field.      Assessment & Plan:  Impression urinary tract infection plan Cipro 250 twice a day 5 days. Symptomatic care discussed. A superior and. WSL

## 2013-09-29 ENCOUNTER — Ambulatory Visit: Payer: Medicare Other | Admitting: Family Medicine

## 2013-10-03 ENCOUNTER — Ambulatory Visit (INDEPENDENT_AMBULATORY_CARE_PROVIDER_SITE_OTHER): Payer: Medicare Other | Admitting: Family Medicine

## 2013-10-03 ENCOUNTER — Encounter: Payer: Self-pay | Admitting: Family Medicine

## 2013-10-03 VITALS — BP 140/80 | Ht 64.0 in | Wt 132.2 lb

## 2013-10-03 DIAGNOSIS — E559 Vitamin D deficiency, unspecified: Secondary | ICD-10-CM | POA: Diagnosis not present

## 2013-10-03 DIAGNOSIS — IMO0002 Reserved for concepts with insufficient information to code with codable children: Secondary | ICD-10-CM

## 2013-10-03 DIAGNOSIS — M810 Age-related osteoporosis without current pathological fracture: Secondary | ICD-10-CM

## 2013-10-03 DIAGNOSIS — M549 Dorsalgia, unspecified: Secondary | ICD-10-CM | POA: Diagnosis not present

## 2013-10-03 MED ORDER — TRAMADOL HCL 50 MG PO TABS
ORAL_TABLET | ORAL | Status: DC
Start: 2013-10-03 — End: 2013-12-19

## 2013-10-03 NOTE — Progress Notes (Signed)
   Subjective:    Patient ID: Jeanne Rogers, female    DOB: 06/14/1941, 73 y.o.   MRN: [id number]  HPI Patient is here today for a follow up visit on osteoporosis. Patient states she has no concerns at this time. Doing very well.   urinaitng now calmed down  Pain overall sig better, serious pain now definietely less  Finds it still uncomfortable to rooll over and move around Back exercises not very compliant at this time.  Takes the tramadol, Takes just a falf per day   handling foxamax ok, vit d compliant with  Tums three per day--takes faithfully. Also maintaining calcium supplement.  Review of Systems No chest pain no abdominal pain no change in bowel habits urinary symptoms improved ROS otherwise negative    Objective:   Physical Exam  Alert no apparent distress. H&T normal. Lungs clear. Heart regular in rhythm. Low back minimal tenderness to percussion. Negative straight leg raise.      Assessment & Plan:  #1 status post compression fracture improved as far as symptomatology but still somewhat frustrated at persistence of symptoms. #2 recent UTI clinically resolved. #3 osteoporosis handling bone density medicine well. No obvious side effects. #4 vitamin D deficiency discussed will check another level in one year. Patient on supplement currently. Physical therapy consult rationale for discussion of exercises and stretching discussed.

## 2013-10-13 ENCOUNTER — Ambulatory Visit (HOSPITAL_COMMUNITY)
Admission: RE | Admit: 2013-10-13 | Discharge: 2013-10-13 | Disposition: A | Discharge status: Home / Self Care | Payer: Medicare Other | Source: Ambulatory Visit | Attending: Family Medicine | Admitting: Family Medicine

## 2013-10-13 DIAGNOSIS — M545 Low back pain, unspecified: Secondary | ICD-10-CM | POA: Diagnosis not present

## 2013-10-13 DIAGNOSIS — IMO0001 Reserved for inherently not codable concepts without codable children: Secondary | ICD-10-CM | POA: Diagnosis not present

## 2013-10-13 NOTE — Evaluation (Signed)
Physical Therapy Evaluation  Patient Details  Name: Jeanne Rogers MRN: [id number] Date of Birth: 1940/10/03  Today's Date: 10/13/2013 Time: 7824-2353 PT Time Calculation (min): 50 min Charges: 1 Evaluation, Therapeutic Exercise [id number]              Visit#: 1 of 8  Re-eval:   Assessment Diagnosis: low back pain secondary to T12 compression fracture and Lumbar disc herniation Prior Therapy: 2005 - low back muscle pain  Authorization: Medicare    Authorization Visit#: 1 of 1   Past Medical History:  Past Medical History  Diagnosis Date  . Osteoporosis   . Hyperlipidemia   . Collapse, lung   . Spontaneous pneumothorax    Past Surgical History:  Past Surgical History  Procedure Laterality Date  . Oophorectomy Bilateral 2005    Subjective Symptoms/Limitations Symptoms: pain. no NT , now weakness. Patient believes that when  being stressedher back is under increased pain. Pain predominantly exacerbated by bending forward.  Pertinent History: Patient had muscle discomfort in 2005 for whi9ch she had PT and had significant improvement in symptoms, she has forgotten her exercises since then and is looking for further education and exercises to treat low back pain. Recent UTI in February 2015. 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 CT scan on 07/10/13 found T12 compression fracture, and mild disc bulging thoughout lumbar spine. patient has history of osteoporosis How long can you sit comfortably?: no limitations, utilizes a lumbar pillow to improve posture How long can you stand comfortably?: no limitation How long can you walk comfortably?: no limitations Repetition: Increases Symptoms Patient Stated Goals: eliminate pain with bending forward and picking up items off floor.  Pain Assessment Currently in Pain?: Yes Pain Score: 5  Pain Location: Back Pain Orientation: Posterior;Right Pain Type: Chronic pain Pain Onset: More than  a month ago Pain Frequency: Constant Pain Relieving Factors: hot shower, avoiding forward ebnidng Effect of Pain on Daily Activities: pain with turning over im bed. pain with sit to stand  Balance Screening Balance Screen Has the patient fallen in the past 6 months: No  Prior Functioning  Prior Function Level of Independence: Independent with basic ADLs  Cognition/Observation Cognition Overall Cognitive Status: Within Functional Limits for tasks assessed  Sensation/Coordination/Flexibility/Functional Tests Sensation Light Touch: Appears Intact Coordination Gross Motor Movements are Fluid and Coordinated: Yes  Assessment Lumbar AROM Lumbar Flexion: 50% limited by pain (patient bends forward from hip and thoracic spine) Lumbar Extension: 60% limited Lumbar - Right Side Bend: 0% limited Lumbar - Left Side Bend: 50% limited by tightness Lumbar - Right Rotation: 20% limited Lumbar - Left Rotation: 45% limited Palpation Palpation: During supine SLR patient displays limited abdominal bracing resulting in decreased spinalstability  Exercise/Treatments Mobility/Balance  Posture/Postural Control Posture/Postural Control: Postural limitations Postural Limitations: Patient displays significant scoliosis with L lumbar and throacic curve resulting in increased Right sidebend. Along with scoliosis patient's posture presents as follows: significant forward head, forward rounded shoulders,increased thoracic kyphosis, decreased lumbar lordosis. Patient displays hip malalignment with R hip displaying increased anterior tilt and L hip dislaying increased  posterior tilt resulting in patient's LE length descrepany (R >L) that is improved with increased hip alignment (unable to completely correct this session).  Seated Other Seated Lumbar Exercises: Seated forward flexion per torerance to bilateral UE overhead reach behind head to increase thoracic extension 10x Other Seated Lumbar Exercises:  seated right sidebend to reaching overhead to Left, 10x Supine Ab Set:  5 reps;5 seconds Bent Knee Raise: 5 reps;3 seconds (single leg and bilateral ) Bridge: 10 reps;3 seconds (left foot forward) Straight Leg Raise: 5 reps;3 seconds  Physical Therapy Assessment and Plan PT Assessment and Plan Clinical Impression Statement: Patient displays signs and symptoms associated with compression fracture and lumbar disc patology including pain with trunk forward flexion, limited extension,side bending and rotation mobility, Patient injury is complicated by presence of posture,  scoliosis and hip malalignment that all predispose patient for increased risk of back pain. patien also display poor abdominal bracing and limited core muscle activiation with upper and/or lower extremeity movement requiring verbal cuing to contract core mucsles. Patient given an HEP to corerect posture and improve trunk stability, but it is in this therapist's opinion that this pateient would benefit from contineud skilled physical therapy  to create lasting improvment in posture, and to progress exercises as patient's symptoms improve. Requesting to see patient 1x a week for 8 weeks to further progress abdominal strengthening exercises,  further address impaired posture, and further educate patient on proper lift techniques. Patient agrees with this plan.   Pt will benefit from skilled therapeutic intervention in order to improve on the following deficits: Decreased mobility;Decreased range of motion;Impaired flexibility;Impaired perceived functional ability;Improper body mechanics Rehab Potential: Good PT Frequency: Min 1X/week PT Duration: 8 weeks PT Treatment/Interventions: Functional mobility training;Therapeutic activities;Therapeutic exercise;Manual techniques;Neuromuscular re-education PT Plan: Patient given HEP and displayed understanding. Recommending patient attend therapy 1x a week for 8 weeks to continue to progress  exercises including progressing bed strengthenign exercises to begin more functional standing core./posture exercise, and cont. Patient verbally requested for more physical therapy visits    Goals Home Exercise Program Pt/caregiver will Perform Home Exercise Program: For increased ROM;For increased strengthening;Independently PT Goal: Perform Home Exercise Program - Progress: Goal set today PT Short Term Goals Time to Complete Short Term Goals: 4 weeks PT Short Term Goal 1: Patient will be independent with HEP including lifting 5lb object from floor with correct spinal alignement PT Short Term Goal 2: Patient will be less than 30% limited with L side bend to indicated improvement in scoliosis PT Short Term Goal 3: Patient will have 3/10  pain with forward bending and extension exercise to improve patient's ability to pick up pen off floor PT Long Term Goals Time to Complete Long Term Goals: 8 weeks PT Long Term Goal 1: Patient will have 0/10 pain with all activites including sitting, standing, and bennding forward so patient canclean home and repetitively pick up things off floor/ground without pan PT Long Term Goal 2: Patient will be able to sit for >10 minutes with good posture as indicated by a neutral head position with ear over shoulder and shoulders over hips to decreased compression forces throughout spine  Problem List Patient Active Problem List   Diagnosis Date Noted  . Low back pain 10/13/2013  . Compression fracture 07/26/2013  . Unspecified vitamin D deficiency 07/26/2013  . Osteoporosis 06/29/2013  . Back pain 06/29/2013    PT - End of Session Activity Tolerance: Patient tolerated treatment well (No increase of pain during exercise) General Behavior During Therapy: WFL for tasks assessed/performed PT Plan of Care PT Home Exercise Plan: Bridging, Abdominal bracing, Ab sets, supine marching, supine SLR, L seated Side bending stretch, seated thoracic extension  PT Patient  Instructions: Perform HEP twice daily Consulted and Agree with Plan of Care: Patient  GP Functional Assessment Tool Used: FOTO Functional Limitation: Mobility: Walking and moving around Mobility: Walking  and Moving Around Current Status [phone]([id number]): At least 40 percent but less than 60 percent impaired, limited or restricted Mobility: Walking and Moving Around Goal Status [phone]([id number]): At least 20 percent but less than 40 percent impaired, limited or restricted  Doyne KeelDeWitt, Ab Leaming R 10/13/2013, 12:41 PM  Physician Documentation Your signature is required to indicate approval of the treatment plan as stated above.  Please sign and either send electronically or make a copy of this report for your files and return this physician signed original.   Please mark one 1.__approve of plan  2. ___approve of plan with the following conditions.   ______________________________                                                          _____________________ Physician Signature                                                                                                             Date

## 2013-10-18 ENCOUNTER — Ambulatory Visit (HOSPITAL_COMMUNITY)
Admission: RE | Admit: 2013-10-18 | Discharge: 2013-10-18 | Disposition: A | Discharge status: Home / Self Care | Payer: Medicare Other | Source: Ambulatory Visit | Attending: Family Medicine | Admitting: Family Medicine

## 2013-10-18 DIAGNOSIS — IMO0001 Reserved for inherently not codable concepts without codable children: Secondary | ICD-10-CM | POA: Diagnosis not present

## 2013-10-18 DIAGNOSIS — M545 Low back pain, unspecified: Secondary | ICD-10-CM | POA: Diagnosis not present

## 2013-10-18 NOTE — Progress Notes (Signed)
Physical Therapy Treatment Patient Details  Name: Jeanne Rogers MRN: [id number] Date of Birth: [birth year]  Today's Date: 10/18/2013 Time: 0930-1015 PT Time Calculation (min): 45 min There ex 9:30-10:15  Visit#: 2 of 8   Authorization: Medicare  Authorization Visit#: 2 of 8   Subjective: Symptoms/Limitations Symptoms: Patient returns today noting increased pain radiating into L buttocks. Patient states she felt great after last session however her pain increased the next day/.  Pertinent History: Patient had muscle discomfort in 2005 for whi9ch she had PT and had significant improvement in symptoms, she has forgotten her exercises since then and is looking for further education and exercises to treat low back pain. Recent UTI in February 2015. 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 CT scan on 07/10/13 found T12 compression fracture, and mild disc bulging thoughout lumbar spine. patient has history of osteoporosis How long can you sit comfortably?: no limitations, utilizes a lumbar pillow to improve posture How long can you stand comfortably?: no limitation How long can you walk comfortably?: no limitations Repetition: Increases Symptoms Pain Assessment Pain Score: 7  (Patient currently in no pain, 7/10 last 48 hours) Pain Location: Back (radiates into buttocks. )  Exercise/Treatments  Stretches Press Ups: 5 reps;10 seconds Seated Other Seated Lumbar Exercises: Seated forward flexion per torerance to bilateral UE overhead reach behind head to increase thoracic extension 10x Other Seated Lumbar Exercises: seated right sidebend to reaching overhead to Left 10x, Seated forward flexion to Thoracic rotation with reach up and behind 10x each Supine Ab Set: 5 reps;5 seconds Bent Knee Raise: 5 reps;3 seconds (single leg and bilateral ) Straight Leg Raise:  (On hold, due to pain provacation) Other Supine Lumbar Exercises: Supine hip  rotation 10x  Manual Therapy Manual Therapy: Joint mobilization Joint Mobilization: T8-L5 central PA grad 2,3,4 mobilzations, T11-L 4 unilateral PA mobilizations; L5 L unilateral PA particularly limited and pain provoctive  Physical Therapy Assessment and Plan PT Assessment and Plan Clinical Impression Statement: Patient displays signs and symptoms associated with compression fracture and lumbar disc pathology including pain with trunk forward flexion, limited extension, side bending and rotation mobility, Patient injury is complicated by presence of posture, scoliosis and hip misalignment that all predispose patient for increased risk of back pain. patient also display poor abdominal bracing and limited core muscle activation with upper and/or lower extremity movement requiring verbal cuing to contract core muscles. Patient given an HEP to correct posture and improve trunk stability, but it is in this therapist's opinion that this patient would benefit from continued skilled physical therapy to create lasting improvement in posture, and to progress exercises as patient's symptoms improve. Today patient notes slight increase in L buttocks pain with rotation/extension, attributed to limited L2-3, 3-4 L unilateral facet mobility. Following joint mobilization pain improved. Patient was able to tolerate UE reaching activities to facilitate improved posture as well as displayed good tolerance of hip rotation and prone press ups to improve extension. Patient noted particular improvement in symptoms following prone press ups, though these were difficulty on her shoulders due to limited UE strength. Patient will continue to benefit from an extension focused exercise plan in addition to posture correcting exercises.  Pt will benefit from skilled therapeutic intervention in order to improve on the following deficits: Decreased mobility;Decreased range of motion;Impaired flexibility;Impaired perceived functional  ability;Improper body mechanics Rehab Potential: Good PT Frequency: Min 1X/week PT Duration: 8 weeks PT Treatment/Interventions: Functional mobility training;Therapeutic activities;Therapeutic exercise;Manual  techniques;Neuromuscular re-education PT Plan: Patient given HEP and displayed understanding. Continue progressing bed strengthening/mobility exercises and begin more functional standing core./posture extension based exercise.    Goals Home Exercise Program Pt/caregiver will Perform Home Exercise Program: For increased ROM;For increased strengthening;Independently PT Short Term Goals Time to Complete Short Term Goals: 4 weeks PT Short Term Goal 1: Patient will be independent with HEP including lifting 5lb object from floor with correct spinal alignement PT Short Term Goal 2: Patient will be less than 30% limited with L side bend to indicated improvement in scoliosis PT Short Term Goal 3: Patient will have 3/10  pain with forward bending and extension exercise to improve patient's ability to pick up pen off floor PT Long Term Goals Time to Complete Long Term Goals: 8 weeks PT Long Term Goal 1: Patient will have 0/10 pain with all activites including sitting, standing, and bennding forward so patient canclean home and repetitively pick up things off floor/ground without pan PT Long Term Goal 2: Patient will be able to sit for >10 minutes with good posture as indicated by a neutral head position with ear over shoulder and shoulders over hips to decreased compression forces throughout spine  Problem List Patient Active Problem List   Diagnosis Date Noted  . Low back pain 10/13/2013  . Compression fracture 07/26/2013  . Unspecified vitamin D deficiency 07/26/2013  . Osteoporosis 06/29/2013  . Back pain 06/29/2013    PT - End of Session Activity Tolerance: Patient tolerated treatment well General Behavior During Therapy: WFL for tasks assessed/performed PT Plan of Care PT Home  Exercise Plan: Bridging, Abdominal bracing, Ab sets, supine marching, supine SLR, L seated Side bending stretch, seated thoracic extension, Added trunk rotation iin hooklying  PT Patient Instructions: Perform HEP twice daily Consulted and Agree with Plan of Care: Patient  GP Functional Assessment Tool Used: Vernard Gambles, Luvern Mischke R DPT 10/18/2013, 11:47 AM

## 2013-10-20 ENCOUNTER — Ambulatory Visit (HOSPITAL_COMMUNITY): Payer: Medicare Other | Admitting: Physical Therapy

## 2013-12-02 ENCOUNTER — Ambulatory Visit (INDEPENDENT_AMBULATORY_CARE_PROVIDER_SITE_OTHER): Payer: Medicare Other | Admitting: Family Medicine

## 2013-12-02 ENCOUNTER — Encounter: Payer: Self-pay | Admitting: Family Medicine

## 2013-12-02 VITALS — BP 130/64 | Ht 64.0 in | Wt 130.2 lb

## 2013-12-02 DIAGNOSIS — R55 Syncope and collapse: Secondary | ICD-10-CM | POA: Diagnosis not present

## 2013-12-02 DIAGNOSIS — Z79899 Other long term (current) drug therapy: Secondary | ICD-10-CM

## 2013-12-02 DIAGNOSIS — D649 Anemia, unspecified: Secondary | ICD-10-CM

## 2013-12-02 DIAGNOSIS — R5383 Other fatigue: Secondary | ICD-10-CM | POA: Diagnosis not present

## 2013-12-02 DIAGNOSIS — R7309 Other abnormal glucose: Secondary | ICD-10-CM | POA: Diagnosis not present

## 2013-12-02 DIAGNOSIS — R5381 Other malaise: Secondary | ICD-10-CM

## 2013-12-02 DIAGNOSIS — R739 Hyperglycemia, unspecified: Secondary | ICD-10-CM

## 2013-12-02 LAB — HEPATIC FUNCTION PANEL
ALBUMIN: 4 g/dL (ref 3.5–5.2)
ALK PHOS: 46 U/L (ref 39–117)
ALT: 14 U/L (ref 0–35)
AST: 18 U/L (ref 0–37)
BILIRUBIN TOTAL: 0.2 mg/dL (ref 0.2–1.2)
Bilirubin, Direct: <0.1 mg/dL (ref 0.0–0.3)
TOTAL PROTEIN: 6.4 g/dL (ref 6.0–8.3)

## 2013-12-02 LAB — POCT GLUCOSE (DEVICE FOR HOME USE): POC Glucose: 100 mg/dl — AB (ref 70–99)

## 2013-12-02 LAB — POCT HEMOGLOBIN: Hemoglobin: 7.9 g/dL — AB (ref 12.2–16.2)

## 2013-12-02 LAB — HM COLONOSCOPY

## 2013-12-02 NOTE — Progress Notes (Signed)
   Subjective:    Patient ID: Jeanne Rogers, female    DOB: 1940/12/26, 73 y.o.   MRN: [id number]  HPI Patient is here today because on Saturday morning when patient was at work she had an episode where she passed out. The EMS was called and patient recovered. She was told by EMS that her blood sugar was 176 and she may have a problem with her glucose and needed to follow up with her PCP. Before passing out the patient was in a hot environment helping in a kitchen. She started to feel like she was passed out she slumped. Was noted to be pale in color. Recalls no chest pain or palpitations.  Felt hot at [place], 126 over 76  Glu wasw elevated  Results for orders placed in visit on 12/02/13  POCT GLUCOSE (DEVICE FOR HOME USE)      Result Value Ref Range   Glucose Fasting, POC    70 - 99 mg/dL   POC Glucose 100 (*) 70 - 99 mg/dl  POCT HEMOGLOBIN      Result Value Ref Range   Hemoglobin 7.9 (*) 12.2 - 16.2 g/dL   Felt otherwise fine,  Sometimes feels hear beats fast but not lightheaded  hgb was low and in the last time the patient went to donate blood a couple months ago. She often donates as frequent as every couple months. Was told numbers were too low. This has never happened before.  Patient has noted increased fatigue with exertion lately.  Gives blood regularly   Review of Systems No prior loss of consciousness no abdominal pain no chest pain no palpitations some exertional shortness of breath ROS otherwise negative    Objective:   Physical Exam  Alert no apparent distress talkative. HEENT normal. Neck supple. Lungs clear. Heart regular rate and rhythm. Abdomen benign.  EKG normal sinus rhythm no significant ST-T changes      Assessment & Plan:  Impression 1 syncopal event. Likely related to vasovagal syncope plus #2. #2 anemia concerning. Very significant. Potential etiologies include occult blood loss and potential aggravation by excess donating a blood. Plan  appropriate blood work. Hemoccult cards. Warning signs discussed. Recheck in several months. 35 minutes spent most in discussion evaluation and deemed plan. Of note patient's daughter is a ER Dr. and she suggested a cardiac workup. This was prior to knowing that she had severe anemia. No evidence of cardiac malady at this point and anemia is likely E. allergy for episode. WSL

## 2013-12-03 LAB — TSH: TSH: 2.326 u[IU]/mL (ref 0.350–4.500)

## 2013-12-03 LAB — CBC WITH DIFFERENTIAL/PLATELET
BASOS PCT: 1 % (ref 0–1)
Basophils Absolute: 0.1 10*3/uL (ref 0.0–0.1)
EOS ABS: 0.2 10*3/uL (ref 0.0–0.7)
Eosinophils Relative: 2 % (ref 0–5)
HEMATOCRIT: 25.2 % — AB (ref 36.0–46.0)
HEMOGLOBIN: 7.7 g/dL — AB (ref 12.0–15.0)
LYMPHS ABS: 2 10*3/uL (ref 0.7–4.0)
Lymphocytes Relative: 24 % (ref 12–46)
MCH: 22.4 pg — AB (ref 26.0–34.0)
MCHC: 30.6 g/dL (ref 30.0–36.0)
MCV: 73.3 fL — ABNORMAL LOW (ref 78.0–100.0)
MONO ABS: 0.7 10*3/uL (ref 0.1–1.0)
MONOS PCT: 9 % (ref 3–12)
Neutro Abs: 5.2 10*3/uL (ref 1.7–7.7)
Neutrophils Relative %: 64 % (ref 43–77)
Platelets: 573 10*3/uL — ABNORMAL HIGH (ref 150–400)
RBC: 3.44 MIL/uL — AB (ref 3.87–5.11)
RDW: 16.3 % — ABNORMAL HIGH (ref 11.5–15.5)
WBC: 8.2 10*3/uL (ref 4.0–10.5)

## 2013-12-03 LAB — T4: T4, Total: 7.5 ug/dL (ref 5.0–12.5)

## 2013-12-03 LAB — FERRITIN: FERRITIN: 4 ng/mL — AB (ref 10–291)

## 2013-12-05 ENCOUNTER — Encounter: Payer: Self-pay | Admitting: Family Medicine

## 2013-12-05 ENCOUNTER — Ambulatory Visit (INDEPENDENT_AMBULATORY_CARE_PROVIDER_SITE_OTHER): Payer: Medicare Other | Admitting: Family Medicine

## 2013-12-05 VITALS — BP 130/86 | Ht 64.0 in | Wt 130.0 lb

## 2013-12-05 DIAGNOSIS — D509 Iron deficiency anemia, unspecified: Secondary | ICD-10-CM

## 2013-12-05 LAB — POC HEMOCCULT BLD/STL (HOME/3-CARD/SCREEN)
FECAL OCCULT BLD: NEGATIVE
FECAL OCCULT BLD: NEGATIVE

## 2013-12-05 MED ORDER — PANTOPRAZOLE SODIUM 40 MG PO TBEC: 40.0000 mg | DELAYED_RELEASE_TABLET | Freq: Every day | ORAL | Status: DC

## 2013-12-05 NOTE — Patient Instructions (Signed)
Take iron gluconate tablets one twice per day, now otc, ask your pharmacist  One multivit with  Iron one each forn, needs to have folic acid at least 543 mcg  Take the acid blocker generic protonix every morning   Avoid aspirin products  You'll hear from [place] on referral  Stool softener good idea

## 2013-12-05 NOTE — Progress Notes (Signed)
Subjective:    Patient ID: Jeanne Rogers, female    DOB: April 25, 1941, 73 y.o.   MRN: [id number]  HPI Patient arrives for a follow up on syncope. Hemoccult card negative times 2.  Patient admits to using a lot of aspirin when necessary for back pain since a stress fracture a number of months ago. No noticeable blood in stools. No black stools.  Also donates blood every 2-3 months.  Had considerable amount of blood work here.  No chest pain no palpitations.  No history of loss of consciousness prior to this event.  Results for orders placed in visit on 12/02/13  CBC WITH DIFFERENTIAL      Result Value Ref Range   WBC 8.2  4.0 - 10.5 K/uL   RBC 3.44 (*) 3.87 - 5.11 MIL/uL   Hemoglobin 7.7 (*) 12.0 - 15.0 g/dL   HCT 25.2 (*) 36.0 - 46.0 %   MCV 73.3 (*) 78.0 - 100.0 fL   MCH 22.4 (*) 26.0 - 34.0 pg   MCHC 30.6  30.0 - 36.0 g/dL   RDW 16.3 (*) 11.5 - 15.5 %   Platelets 573 (*) 150 - 400 K/uL   Neutrophils Relative % 64  43 - 77 %   Neutro Abs 5.2  1.7 - 7.7 K/uL   Lymphocytes Relative 24  12 - 46 %   Lymphs Abs 2.0  0.7 - 4.0 K/uL   Monocytes Relative 9  3 - 12 %   Monocytes Absolute 0.7  0.1 - 1.0 K/uL   Eosinophils Relative 2  0 - 5 %   Eosinophils Absolute 0.2  0.0 - 0.7 K/uL   Basophils Relative 1  0 - 1 %   Basophils Absolute 0.1  0.0 - 0.1 K/uL   Smear Review Criteria for review not met    FERRITIN      Result Value Ref Range   Ferritin 4 (*) 10 - 291 ng/mL  HEPATIC FUNCTION PANEL      Result Value Ref Range   Total Bilirubin 0.2  0.2 - 1.2 mg/dL   Bilirubin, Direct <0.1  0.0 - 0.3 mg/dL   Indirect Bilirubin NOT CALC  0.2 - 1.2 mg/dL   Alkaline Phosphatase 46  39 - 117 U/L   AST 18  0 - 37 U/L   ALT 14  0 - 35 U/L   Total Protein 6.4  6.0 - 8.3 g/dL   Albumin 4.0  3.5 - 5.2 g/dL  TSH      Result Value Ref Range   TSH 2.326  0.350 - 4.500 uIU/mL  T4      Result Value Ref Range   T4, Total 7.5  5.0 - 12.5 ug/dL  POCT GLUCOSE (DEVICE FOR HOME USE)   Result Value Ref Range   Glucose Fasting, POC    70 - 99 mg/dL   POC Glucose 100 (*) 70 - 99 mg/dl  POCT HEMOGLOBIN      Result Value Ref Range   Hemoglobin 7.9 (*) 12.2 - 16.2 g/dL  POC HEMOCCULT BLD/STL (HOME/3-CARD/SCREEN)      Result Value Ref Range   Card #1 Date       Fecal Occult Blood, POC Negative     Card #2 Date       Card #2 Fecal Occult Blod, POC Negative     Card #3 Date       Card #3 Fecal Occult Blood, POC  Wonders about her thyroid. Patient lives thyroid fine.  Review of Systems No chest pain no abdominal pain no vomiting. Some weakness. Some slight dizziness. No reflux symptoms ROS otherwise than    Objective:   Physical Exam  Alert no acute distress. H&T normal. Vital stable. Lungs clear. Heart rare rhythm. Abdomen benign.      Assessment & Plan:  Impression microcytic anemia with very low ferritin discussed likely from GI source. Has been using a lot of aspirin. #2 syncopal event. Plan stop aspirin. May use Tylenol. May use tramadol. Appetite. Urgent GI referral. Initiate iron gluconate one tablet twice a day. Multivitamin with iron one daily. Stool softener if need be. Recheck in one month. Warning signs discussed. WSL

## 2013-12-08 ENCOUNTER — Ambulatory Visit (INDEPENDENT_AMBULATORY_CARE_PROVIDER_SITE_OTHER): Payer: Medicare Other | Admitting: Internal Medicine

## 2013-12-08 ENCOUNTER — Encounter (INDEPENDENT_AMBULATORY_CARE_PROVIDER_SITE_OTHER): Payer: Self-pay | Admitting: Internal Medicine

## 2013-12-08 ENCOUNTER — Telehealth (INDEPENDENT_AMBULATORY_CARE_PROVIDER_SITE_OTHER): Payer: Self-pay | Admitting: *Deleted

## 2013-12-08 ENCOUNTER — Other Ambulatory Visit (INDEPENDENT_AMBULATORY_CARE_PROVIDER_SITE_OTHER): Payer: Self-pay | Admitting: *Deleted

## 2013-12-08 ENCOUNTER — Encounter: Payer: Self-pay | Admitting: Family Medicine

## 2013-12-08 VITALS — BP 132/50 | HR 88 | Ht 64.0 in | Wt 130.3 lb

## 2013-12-08 DIAGNOSIS — Z1211 Encounter for screening for malignant neoplasm of colon: Secondary | ICD-10-CM

## 2013-12-08 DIAGNOSIS — D649 Anemia, unspecified: Secondary | ICD-10-CM

## 2013-12-08 MED ORDER — PEG 3350-KCL-NA BICARB-NACL 420 G PO SOLR: 4000.0000 mL | Freq: Once | ORAL | Status: DC

## 2013-12-08 NOTE — Patient Instructions (Signed)
EGD/Colonoscopy. The risks and benefits such as perforation, bleeding, and infection were reviewed with the patient and is agreeable. 

## 2013-12-08 NOTE — Progress Notes (Signed)
Subjective:     Patient ID: Jeanne Rogers, female   DOB: [birth year], 73 y.o.   MRN: [id number]  HPIReferred to our office by Dr. Calton Golds for anemia. Recent CBC revealed hemoglobin of 7.7. I do not have other H and H to compare. She does give blood on a regular basis.  She had a syncopal episode 11/25/2012 and EMS was called. BS was checked and was normal.  Patient refused transport to hospital. She tried to give blood in March at the [facility] and could not give it. Hemoglobin was 7.00.   She gave blood in January.  Appetite is good. No unintentional weight loss. No acid reflux  No abdominal pain.  She has a BM daily. No melena or bright red rectal bleeding. Stool cards at Dr Lance Sell office was negative. She has been taking up to four ASA 325mg  daily for a couple of months for chronic back pain.(T1 fx);Marland Kitchen  Her last colonoscopy was in 2005 by Dr. Laural Golden by Dr. Laural Golden which revealed:FINAL DIAGNOSES:  1. Left colonic diverticulosis.  2. External hemorrhoids.  3. No evidence of colonic polyps.  01/15/2004 EGD, N and V and hematemesis  IMPRESSION:  1. 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  2. Excoriation at the esophagogastric junction likely indicative of a minor  Mallory-Weiss tear.  3. 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 Otherwise normal  esophagus.  4. Stomach with small hiatal hernia. Three small gastric ulcers without  bleeding stigmata. Otherwise normal gastric mucosa.  5. Normal first and second portions of the duodenum.  6. I suspect the patient has suffered trivial upper gastrointestinal  bleeding secondary to a minor Mallory-Weiss tear.  CBC    Component Value Date/Time   WBC 8.2 12/02/2013 1528   RBC 3.44* 12/02/2013 1528   HGB 7.7* 12/02/2013 1528   HGB 7.9* 12/02/2013 1436   HCT 25.2* 12/02/2013 1528   PLT 573* 12/02/2013 1528   MCV 73.3* 12/02/2013 1528   MCH 22.4* 12/02/2013  1528   MCHC 30.6 12/02/2013 1528   RDW 16.3* 12/02/2013 1528   LYMPHSABS 2.0 12/02/2013 1528   MONOABS 0.7 12/02/2013 1528   EOSABS 0.2 12/02/2013 1528   BASOSABS 0.1 12/02/2013 1528   Ferritin was 4 12/02/2013.    Review of Systems see hpi  Past Medical History  Diagnosis Date  . Osteoporosis   . Hyperlipidemia   . Collapse, lung   . Spontaneous pneumothorax     Past Surgical History  Procedure Laterality Date  . Oophorectomy Bilateral 2005  . Hysterectomy      Allergies  Allergen Reactions  . Methergine [Methylergonovine Maleate]     Current Outpatient Prescriptions on File Prior to Visit  Medication Sig Dispense Refill  . alendronate (FOSAMAX) 70 MG tablet TAKE 1 TABLET BY MOUTH EVERY WEEK  12 tablet  1  . Cholecalciferol (DIALYVITE VITAMIN D 5000) 5000 UNITS capsule Take 1 capsule (5,000 Units total) by mouth daily.  30 capsule  5  . clotrimazole-betamethasone (LOTRISONE) cream Apply 1 application topically 2 (two) times daily.  30 g  0  . pantoprazole (PROTONIX) 40 MG tablet Take 1 tablet (40 mg total) by mouth daily.  30 tablet  5  . traMADol (ULTRAM) 50 MG tablet Take 1/2-1 tablet BID PRN  40 tablet  0   No current facility-administered medications on file prior to visit.        Objective:   Physical Exam  Filed Vitals:   12/08/13 1424  BP: 132/50  Pulse: 88  Height: 5\' 4"  (1.626 m)  Weight: 130 lb 4.8 oz (59.104 kg)   Alert and oriented. Skin warm and dry. Oral mucosa is moist.   . Sclera anicteric, conjunctivae is pink. Thyroid not enlarged. No cervical lymphadenopathy. Lungs clear. Heart regular rate and rhythm.  Abdomen is soft. Bowel sounds are positive. No hepatomegaly. No abdominal masses felt. No tenderness.  No edema to lower extremities. Stool brown and guaiac negative.     Assessment:   Iron deficiency  Anemia. GI source of blood loss needs to be ruled out. Patient gives a hx of recent ASA use.   I discussed this case with Dr. Laural Golden.  Plan:      EGD/Colonoscopy. Iron infusion x 3. First infusion tomorrow.  CBC today.

## 2013-12-08 NOTE — Telephone Encounter (Signed)
Patient needs trilyte 

## 2013-12-09 ENCOUNTER — Encounter (HOSPITAL_COMMUNITY)
Admission: RE | Admit: 2013-12-09 | Discharge: 2013-12-09 | Disposition: A | Discharge status: Home / Self Care | Payer: Medicare Other | Source: Ambulatory Visit | Attending: Internal Medicine | Admitting: Internal Medicine

## 2013-12-09 ENCOUNTER — Encounter (HOSPITAL_COMMUNITY): Payer: Self-pay

## 2013-12-09 DIAGNOSIS — D508 Other iron deficiency anemias: Secondary | ICD-10-CM | POA: Insufficient documentation

## 2013-12-09 DIAGNOSIS — Z79899 Other long term (current) drug therapy: Secondary | ICD-10-CM | POA: Insufficient documentation

## 2013-12-09 LAB — CBC
HEMATOCRIT: 25.2 % — AB (ref 36.0–46.0)
Hemoglobin: 7.4 g/dL — ABNORMAL LOW (ref 12.0–15.0)
MCH: 22 pg — ABNORMAL LOW (ref 26.0–34.0)
MCHC: 29.4 g/dL — ABNORMAL LOW (ref 30.0–36.0)
MCV: 75 fL — AB (ref 78.0–100.0)
Platelets: 462 10*3/uL — ABNORMAL HIGH (ref 150–400)
RBC: 3.36 MIL/uL — AB (ref 3.87–5.11)
RDW: 16.5 % — AB (ref 11.5–15.5)
WBC: 8.4 10*3/uL (ref 4.0–10.5)

## 2013-12-09 MED ORDER — SODIUM CHLORIDE 0.9 % IV SOLN
510.0000 mg | Freq: Once | INTRAVENOUS | Status: DC
  Filled 2013-12-09: qty 17

## 2013-12-09 MED ORDER — FERUMOXYTOL INJECTION 510 MG/17 ML: 510.0000 mg | Freq: Once | INTRAVENOUS | Status: DC

## 2013-12-14 ENCOUNTER — Encounter (HOSPITAL_COMMUNITY)
Admission: RE | Admit: 2013-12-14 | Discharge: 2013-12-14 | Disposition: A | Discharge status: Home / Self Care | Payer: Medicare Other | Source: Ambulatory Visit | Attending: Internal Medicine | Admitting: Internal Medicine

## 2013-12-14 DIAGNOSIS — Z79899 Other long term (current) drug therapy: Secondary | ICD-10-CM | POA: Diagnosis not present

## 2013-12-14 DIAGNOSIS — D508 Other iron deficiency anemias: Secondary | ICD-10-CM | POA: Diagnosis not present

## 2013-12-14 LAB — HEMOGLOBIN AND HEMATOCRIT, BLOOD
HEMATOCRIT: 28.4 % — AB (ref 36.0–46.0)
HEMOGLOBIN: 8.5 g/dL — AB (ref 12.0–15.0)

## 2013-12-14 MED ORDER — SODIUM CHLORIDE 0.9 % IV SOLN
510.0000 mg | Freq: Once | INTRAVENOUS | Status: AC
  Administered 2013-12-14: 510 mg via INTRAVENOUS
  Filled 2013-12-14: qty 17

## 2013-12-14 MED ORDER — FERUMOXYTOL INJECTION 510 MG/17 ML: 510.0000 mg | Freq: Once | INTRAVENOUS | Status: DC

## 2013-12-14 MED ORDER — SODIUM CHLORIDE 0.9 % IV SOLN
INTRAVENOUS | Status: AC
Start: 2013-12-14 — End: 2013-12-14
  Filled 2013-12-14: qty 50

## 2013-12-14 NOTE — Progress Notes (Signed)
Hemoglobin 8.5 (L)  Final HCT 28.4 (L) Post 2nd feraheme infusion

## 2013-12-15 ENCOUNTER — Telehealth (INDEPENDENT_AMBULATORY_CARE_PROVIDER_SITE_OTHER): Payer: Self-pay | Admitting: *Deleted

## 2013-12-15 ENCOUNTER — Encounter (INDEPENDENT_AMBULATORY_CARE_PROVIDER_SITE_OTHER): Payer: Self-pay | Admitting: *Deleted

## 2013-12-15 DIAGNOSIS — D649 Anemia, unspecified: Secondary | ICD-10-CM

## 2013-12-15 DIAGNOSIS — D509 Iron deficiency anemia, unspecified: Secondary | ICD-10-CM

## 2013-12-15 NOTE — Telephone Encounter (Signed)
.  Per Lelon Perla to have labs drawn in 1 week .

## 2013-12-19 ENCOUNTER — Encounter (HOSPITAL_COMMUNITY): Payer: Self-pay | Admitting: Pharmacy Technician

## 2013-12-20 ENCOUNTER — Ambulatory Visit (HOSPITAL_COMMUNITY)
Admission: RE | Admit: 2013-12-20 | Discharge: 2013-12-20 | Disposition: A | Discharge status: Home / Self Care | Payer: Medicare Other | Source: Ambulatory Visit | Attending: Family Medicine | Admitting: Family Medicine

## 2013-12-20 ENCOUNTER — Telehealth: Payer: Self-pay | Admitting: Family Medicine

## 2013-12-20 DIAGNOSIS — M545 Low back pain, unspecified: Secondary | ICD-10-CM | POA: Insufficient documentation

## 2013-12-20 DIAGNOSIS — IMO0001 Reserved for inherently not codable concepts without codable children: Secondary | ICD-10-CM | POA: Insufficient documentation

## 2013-12-20 NOTE — Telephone Encounter (Signed)
Left message on voicemail notifying patient that order for physical therapy has been sent to [facility].

## 2013-12-20 NOTE — Telephone Encounter (Signed)
Let's do 

## 2013-12-20 NOTE — Telephone Encounter (Signed)
Patient needs a new Rx for PT sent to [facility] because it has now expired.

## 2013-12-22 DIAGNOSIS — D509 Iron deficiency anemia, unspecified: Secondary | ICD-10-CM | POA: Diagnosis not present

## 2013-12-22 DIAGNOSIS — D649 Anemia, unspecified: Secondary | ICD-10-CM | POA: Diagnosis not present

## 2013-12-22 LAB — CBC
HCT: 34.2 % — ABNORMAL LOW (ref 36.0–46.0)
HEMOGLOBIN: 10.6 g/dL — AB (ref 12.0–15.0)
MCH: 24.8 pg — ABNORMAL LOW (ref 26.0–34.0)
MCHC: 31 g/dL (ref 30.0–36.0)
MCV: 80.1 fL (ref 78.0–100.0)
Platelets: 527 10*3/uL — ABNORMAL HIGH (ref 150–400)
RBC: 4.27 MIL/uL (ref 3.87–5.11)
RDW: 25 % — ABNORMAL HIGH (ref 11.5–15.5)
WBC: 5.1 10*3/uL (ref 4.0–10.5)

## 2013-12-23 ENCOUNTER — Encounter: Payer: Self-pay | Admitting: Family Medicine

## 2013-12-26 ENCOUNTER — Other Ambulatory Visit: Payer: Self-pay | Admitting: Family Medicine

## 2013-12-27 NOTE — Telephone Encounter (Signed)
Ok plus one ref 

## 2013-12-28 ENCOUNTER — Other Ambulatory Visit: Payer: Self-pay | Admitting: Family Medicine

## 2013-12-30 ENCOUNTER — Telehealth: Payer: Self-pay | Admitting: Family Medicine

## 2013-12-30 MED ORDER — TRAMADOL HCL 50 MG PO TABS: ORAL_TABLET | ORAL | Status: DC

## 2013-12-30 NOTE — Telephone Encounter (Signed)
Patient needs Rx for Tramadol.  [facility]

## 2013-12-30 NOTE — Telephone Encounter (Signed)
Ok plus two ref 

## 2013-12-30 NOTE — Telephone Encounter (Signed)
Rx faxed to pharmacy. Patient notified. 

## 2014-01-04 ENCOUNTER — Ambulatory Visit (HOSPITAL_COMMUNITY)
Admission: RE | Admit: 2014-01-04 | Discharge: 2014-01-04 | Disposition: A | Discharge status: Home / Self Care | Payer: Medicare Other | Source: Ambulatory Visit | Attending: Internal Medicine | Admitting: Internal Medicine

## 2014-01-04 ENCOUNTER — Encounter (HOSPITAL_COMMUNITY): Payer: Self-pay

## 2014-01-04 ENCOUNTER — Encounter (HOSPITAL_COMMUNITY)
Admission: RE | Disposition: A | Discharge status: Home / Self Care | Payer: Self-pay | Source: Ambulatory Visit | Attending: Internal Medicine

## 2014-01-04 DIAGNOSIS — D126 Benign neoplasm of colon, unspecified: Secondary | ICD-10-CM

## 2014-01-04 DIAGNOSIS — Z888 Allergy status to other drugs, medicaments and biological substances status: Secondary | ICD-10-CM | POA: Diagnosis not present

## 2014-01-04 DIAGNOSIS — K644 Residual hemorrhoidal skin tags: Secondary | ICD-10-CM | POA: Diagnosis not present

## 2014-01-04 DIAGNOSIS — Z87891 Personal history of nicotine dependence: Secondary | ICD-10-CM | POA: Insufficient documentation

## 2014-01-04 DIAGNOSIS — Z79899 Other long term (current) drug therapy: Secondary | ICD-10-CM | POA: Insufficient documentation

## 2014-01-04 DIAGNOSIS — Z7982 Long term (current) use of aspirin: Secondary | ICD-10-CM | POA: Diagnosis not present

## 2014-01-04 DIAGNOSIS — K2970 Gastritis, unspecified, without bleeding: Secondary | ICD-10-CM | POA: Diagnosis not present

## 2014-01-04 DIAGNOSIS — M810 Age-related osteoporosis without current pathological fracture: Secondary | ICD-10-CM | POA: Insufficient documentation

## 2014-01-04 DIAGNOSIS — D509 Iron deficiency anemia, unspecified: Secondary | ICD-10-CM | POA: Diagnosis not present

## 2014-01-04 DIAGNOSIS — K2990 Gastroduodenitis, unspecified, without bleeding: Secondary | ICD-10-CM | POA: Diagnosis not present

## 2014-01-04 DIAGNOSIS — K449 Diaphragmatic hernia without obstruction or gangrene: Secondary | ICD-10-CM | POA: Diagnosis not present

## 2014-01-04 DIAGNOSIS — K5730 Diverticulosis of large intestine without perforation or abscess without bleeding: Secondary | ICD-10-CM | POA: Diagnosis not present

## 2014-01-04 DIAGNOSIS — D649 Anemia, unspecified: Secondary | ICD-10-CM

## 2014-01-04 DIAGNOSIS — E785 Hyperlipidemia, unspecified: Secondary | ICD-10-CM | POA: Insufficient documentation

## 2014-01-04 HISTORY — DX: Adverse effect of unspecified anesthetic, initial encounter: T41.45XA

## 2014-01-04 HISTORY — PX: ESOPHAGOGASTRODUODENOSCOPY: SHX5428

## 2014-01-04 HISTORY — PX: COLONOSCOPY: SHX5424

## 2014-01-04 HISTORY — DX: Other complications of anesthesia, initial encounter: T88.59XA

## 2014-01-04 SURGERY — COLONOSCOPY
Anesthesia: Moderate Sedation

## 2014-01-04 MED ORDER — MEPERIDINE HCL 50 MG/ML IJ SOLN
INTRAMUSCULAR | Status: AC
  Filled 2014-01-04: qty 1

## 2014-01-04 MED ORDER — BUTAMBEN-TETRACAINE-BENZOCAINE 2-2-14 % EX AERO
INHALATION_SPRAY | CUTANEOUS | Status: DC | PRN
  Administered 2014-01-04: 2 via TOPICAL

## 2014-01-04 MED ORDER — MEPERIDINE HCL 50 MG/ML IJ SOLN
INTRAMUSCULAR | Status: DC | PRN
  Administered 2014-01-04 (×2): 25 mg via INTRAVENOUS

## 2014-01-04 MED ORDER — MIDAZOLAM HCL 5 MG/5ML IJ SOLN
INTRAMUSCULAR | Status: AC
  Filled 2014-01-04: qty 10

## 2014-01-04 MED ORDER — IBUPROFEN 200 MG PO TABS: 200.0000 mg | ORAL_TABLET | Freq: Four times a day (QID) | ORAL | Status: DC | PRN

## 2014-01-04 MED ORDER — SODIUM CHLORIDE 0.9 % IV SOLN
INTRAVENOUS | Status: DC
  Administered 2014-01-04: 13:00:00 via INTRAVENOUS

## 2014-01-04 MED ORDER — MIDAZOLAM HCL 5 MG/5ML IJ SOLN
INTRAMUSCULAR | Status: DC | PRN
  Administered 2014-01-04: 1 mg via INTRAVENOUS
  Administered 2014-01-04: 2 mg via INTRAVENOUS
  Administered 2014-01-04: 1 mg via INTRAVENOUS
  Administered 2014-01-04: 2 mg via INTRAVENOUS

## 2014-01-04 MED ORDER — STERILE WATER FOR IRRIGATION IR SOLN
Status: DC | PRN
  Administered 2014-01-04: 13:00:00

## 2014-01-04 NOTE — Discharge Instructions (Signed)
Resume usual medications. Advil OTC(200 mg) 2 tablets twice daily as needed after meals. High fiber diet. No driving for 24 hours. Physician will call the biopsy results. Esophagogastroduodenoscopy Care After Refer to this sheet in the next few weeks. These instructions provide you with information on caring for yourself after your procedure. Your caregiver may also give you more specific instructions. Your treatment has been planned according to current medical practices, but problems sometimes occur. Call your caregiver if you have any problems or questions after your procedure.  HOME CARE INSTRUCTIONS  Do not eat or drink anything until the numbing medicine (local anesthetic) has worn off and your gag reflex has returned. You will know that the local anesthetic has worn off when you can swallow comfortably.  Do not drive for 12 hours after the procedure or as directed by your caregiver.  Only take medicines as directed by your caregiver. SEEK MEDICAL CARE IF:   You cannot stop coughing.  You are not urinating at all or less than usual. SEEK IMMEDIATE MEDICAL CARE IF:  You have difficulty swallowing.  You cannot eat or drink.  You have worsening throat or chest pain.  You have dizziness, lightheadedness, or you faint.  You have nausea or vomiting.  You have chills.  You have a fever.  You have severe abdominal pain.  You have black, tarry, or bloody stools.  Colonoscopy, Care After Refer to this sheet in the next few weeks. These instructions provide you with information on caring for yourself after your procedure. Your health care provider may also give you more specific instructions. Your treatment has been planned according to current medical practices, but problems sometimes occur. Call your health care provider if you have any problems or questions after your procedure. WHAT TO EXPECT AFTER THE PROCEDURE  After your procedure, it is typical to have the  following:  A small amount of blood in your stool.  Moderate amounts of gas and mild abdominal cramping or bloating. HOME CARE INSTRUCTIONS  Do not drive, operate machinery, or sign important documents for 24 hours.  You may shower and resume your regular physical activities, but move at a slower pace for the first 24 hours.  Take frequent rest periods for the first 24 hours.  Walk around or put a warm pack on your abdomen to help reduce abdominal cramping and bloating.  Drink enough fluids to keep your urine clear or pale yellow.  You may resume your normal diet as instructed by your health care provider. Avoid heavy or fried foods that are hard to digest.  Avoid drinking alcohol for 24 hours or as instructed by your health care provider.  Only take over-the-counter or prescription medicines as directed by your health care provider.  If a tissue sample (biopsy) was taken during your procedure:  Do not take aspirin or blood thinners for 7 days, or as instructed by your health care provider.  Do not drink alcohol for 7 days, or as instructed by your health care provider.  Eat soft foods for the first 24 hours. SEEK MEDICAL CARE IF: You have persistent spotting of blood in your stool 2 3 days after the procedure. SEEK IMMEDIATE MEDICAL CARE IF:  You have more than a small spotting of blood in your stool.  You pass large blood clots in your stool.  Your abdomen is swollen (distended).  You have nausea or vomiting.  You have a fever.  You have increasing abdominal pain that is not relieved with  medicine.   High-Fiber Diet Fiber is found in fruits, vegetables, and grains. A high-fiber diet encourages the addition of more whole grains, legumes, fruits, and vegetables in your diet. The recommended amount of fiber for adult males is 38 g per day. For adult females, it is 25 g per day. Pregnant and lactating women should get 28 g of fiber per day. If you have a digestive or  bowel problem, ask your caregiver for advice before adding high-fiber foods to your diet. Eat a variety of high-fiber foods instead of only a select few type of foods.  PURPOSE  To increase stool bulk.  To make bowel movements more regular to prevent constipation.  To lower cholesterol.  To prevent overeating. WHEN IS THIS DIET USED?  It may be used if you have constipation and hemorrhoids.  It may be used if you have uncomplicated diverticulosis (intestine condition) and irritable bowel syndrome.  It may be used if you need help with weight management.  It may be used if you want to add it to your diet as a protective measure against atherosclerosis, diabetes, and cancer. SOURCES OF FIBER  Whole-grain breads and cereals.  Fruits, such as apples, oranges, bananas, berries, prunes, and pears.  Vegetables, such as green peas, carrots, sweet potatoes, beets, broccoli, cabbage, spinach, and artichokes.  Legumes, such split peas, soy, lentils.  Almonds. FIBER CONTENT IN FOODS Starches and Grains / Dietary Fiber (g)  Cheerios, 1 cup / 3 g  Corn Flakes cereal, 1 cup / 0.7 g  Rice crispy treat cereal, 1 cup / 0.3 g  Instant oatmeal (cooked),  cup / 2 g  Frosted wheat cereal, 1 cup / 5.1 g  Brown, long-grain rice (cooked), 1 cup / 3.5 g  White, long-grain rice (cooked), 1 cup / 0.6 g  Enriched macaroni (cooked), 1 cup / 2.5 g Legumes / Dietary Fiber (g)  Baked beans (canned, plain, or vegetarian),  cup / 5.2 g  Kidney beans (canned),  cup / 6.8 g  Pinto beans (cooked),  cup / 5.5 g Breads and Crackers / Dietary Fiber (g)  Plain or honey graham crackers, 2 squares / 0.7 g  Saltine crackers, 3 squares / 0.3 g  Plain, salted pretzels, 10 pieces / 1.8 g  Whole-wheat bread, 1 slice / 1.9 g  White bread, 1 slice / 0.7 g  Raisin bread, 1 slice / 1.2 g  Plain bagel, 3 oz / 2 g  Flour tortilla, 1 oz / 0.9 g  Corn tortilla, 1 small / 1.5 g  Hamburger or  hotdog bun, 1 small / 0.9 g Fruits / Dietary Fiber (g)  Apple with skin, 1 medium / 4.4 g  Sweetened applesauce,  cup / 1.5 g  Banana,  medium / 1.5 g  Grapes, 10 grapes / 0.4 g  Orange, 1 small / 2.3 g  Raisin, 1.5 oz / 1.6 g  Melon, 1 cup / 1.4 g Vegetables / Dietary Fiber (g)  Green beans (canned),  cup / 1.3 g  Carrots (cooked),  cup / 2.3 g  Broccoli (cooked),  cup / 2.8 g  Peas (cooked),  cup / 4.4 g  Mashed potatoes,  cup / 1.6 g  Lettuce, 1 cup / 0.5 g  Corn (canned),  cup / 1.6 g  Tomato,  cup / 1.1 g

## 2014-01-04 NOTE — H&P (Signed)
Jeanne Rogers is an 73 y.o. female.   Chief Complaint: Patient's here for EGD and colonoscopy. HPI:  Patient is 73 year old Caucasian female who was recently found to have iron deficiency anemia. She has been donating blood and urine for several years. She went to donate blood in March this year and was told she is anemic. On further testing she was found to have iron deficiency anemia Hemoccult x2 negative. She denies melena rectal bleeding or abdominal pain. She sustained compression fracture to one of her vertebra November last year due to osteoporosis and has been taking aspirin 3-4 times a day. On initial presentation her hemoglobin was 7.7 g. She was given parenteral iron and her hemoglobin is up to 10.6 g. Family history is negative for CRC. Last colonoscopy was in 2005 revealing sigmoid colon diverticulosis.  Past Medical History  Diagnosis Date  . Osteoporosis   . Hyperlipidemia   . Collapse, lung   . Spontaneous pneumothorax   . Complication of anesthesia     very sensitive to anesthesia    Past Surgical History  Procedure Laterality Date  . Oophorectomy Bilateral 2005  . Hysterectomy      Family History  Problem Relation Age of Onset  . Hyperlipidemia Mother   . Hypertension Father    Social History:  reports that she has quit smoking. She quit smokeless tobacco use about 25 years ago. She reports that she does not drink alcohol or use illicit drugs.  Allergies:  Allergies  Allergen Reactions  . Methergine [Methylergonovine Maleate] Other (See Comments)    Unknown    Medications Prior to Admission  Medication Sig Dispense Refill  . alendronate (FOSAMAX) 70 MG tablet TAKE 1 TABLET BY MOUTH EVERY WEEK  12 tablet  1  . Cholecalciferol (DIALYVITE VITAMIN D 5000) 5000 UNITS capsule Take 1 capsule (5,000 Units total) by mouth daily.  30 capsule  5  . ferrous sulfate 325 (65 FE) MG EC tablet Take 325 mg by mouth 2 (two) times daily.      . Multiple Vitamin  (MULTIVITAMIN) tablet Take 1 tablet by mouth daily.      . pantoprazole (PROTONIX) 40 MG tablet Take 1 tablet (40 mg total) by mouth daily.  30 tablet  5  . polyethylene glycol-electrolytes (TRILYTE) 420 G solution Take 4,000 mLs by mouth once.  4000 mL  0  . traMADol (ULTRAM) 50 MG tablet TAKE ONE-HALF TO ONE TABLET BY MOUTH TWICE DAILY AS NEEDED  40 tablet  2    No results found for this or any previous visit (from the past 48 hour(s)). No results found.  ROS  Blood pressure 169/72, pulse 76, temperature 97.4 F (36.3 C), temperature source Oral, resp. rate 17, height 5\' 4"  (1.626 m), weight 130 lb (58.968 kg), SpO2 97.00%. Physical Exam  Constitutional: She appears well-developed and well-nourished.  HENT:  Mouth/Throat: Oropharynx is clear and moist.  Eyes: Conjunctivae are normal. No scleral icterus.  Neck: No thyromegaly present.  Cardiovascular: Normal rate, regular rhythm and normal heart sounds.   No murmur heard. Respiratory: Effort normal and breath sounds normal.  GI: Soft. She exhibits no distension and no mass. There is no tenderness.  Musculoskeletal: She exhibits no edema.  Lymphadenopathy:    She has no cervical adenopathy.  Neurological: She is alert.  Skin: Skin is warm and dry.     Assessment/Plan Iron deficiency anemia. History of NSAID use. Diagnostic EGD and colonoscopy  Jeanne Rogers U Jeanne Rogers 01/04/2014, 1:32 PM

## 2014-01-04 NOTE — Op Note (Addendum)
EGD AND COLONOSCOPY PROCEDURE REPORT  PATIENT:  Jeanne Rogers  MR#:  [id number] Birthdate:  [birth year], 73 y.o., female Endoscopist:  Dr. Rogene Houston, MD Referred By:  Dr. Grace Bushy. Luking,MD Procedure Date: 01/04/2014  Procedure:   EGD & Colonoscopy  Indications:  Patient is 73 year old Caucasian female who was recently found to have anemia with hemoglobin of 7.7 g. Iron studies confirmed iron deficiency anemia and she has responded to parenteral iron therapy with hemoglobin coming up to 10.6 few weeks ago. Her stool is guaiac negative. Patient has been experiencing back pain since November 2014 secondary to compression fracture of T12 and has been using full dose aspirin 3-4 tablets daily. There is no history of melena or rectal bleeding. Last colonoscopy was in 2005.            Informed Consent:  The risks, benefits, alternatives & imponderables which include, but are not limited to, bleeding, infection, perforation, drug reaction and potential missed lesion have been reviewed.  The potential for biopsy, lesion removal, esophageal dilation, etc. have also been discussed.  Questions have been answered.  All parties agreeable.  Please see history & physical in medical record for more information.  Medications:  Demerol 50 mg IV Versed 6 mg IV Cetacaine spray topically for oropharyngeal anesthesia  EGD  Description of procedure:  The endoscope was introduced through the mouth and advanced to the second portion of the duodenum without difficulty or limitations. The mucosal surfaces were surveyed very carefully during advancement of the scope and upon withdrawal.  Findings:  Esophagus:  Mucosa of the esophagus was normal. GE junction was unremarkable. Hiatus was wide-open. GEJ:  31 cm Hiatus:  38 cm Stomach:  Stomach was empty and distended very well with insufflation. Folds in the proximal stomach were normal. Examination of mucosa at body was normal. Antral mucosa similarly was  normal with exception of focal area with erythema edema and  scar in the prepyloric region. Biopsy taken from this area for routine histology. Pyloric channel was patent. Angularis fundus and cardia unremarkable and hernia easily seen on this view. Duodenum:  Normal bulbar and post bulbar mucosa.  Therapeutic/Diagnostic Maneuvers Performed:  See above.  COLONOSCOPY Description of procedure:  After a digital rectal exam was performed, that colonoscope was advanced from the anus through the rectum and colon to the area of the cecum, ileocecal valve and appendiceal orifice. The cecum was deeply intubated. These structures were well-seen and photographed for the record. From the level of the cecum and ileocecal valve, the scope was slowly and cautiously withdrawn. The mucosal surfaces were carefully surveyed utilizing scope tip to flexion to facilitate fold flattening as needed. The scope was pulled down into the rectum where a thorough exam including retroflexion was performed.  Findings:   Prep excellent. Small polys ablated via cold biopsy from ascending colon. Moderate number of diverticula noted at sigmoid colon. Normal rectal mucosa. Small hemorrhoids below the dentate line along with two anal papillae.  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 Focal gastritis and scar in prepyloric region consistent with healed ulcer. Biopsy taken from this area for routine histology.  Colonoscopy findings; Small polyp ablated via cold biopsy from ascending colon. Moderate sigmoid colon diverticulosis. External hemorrhoids and two anal papillae.  Comment; Given EGD findings she may have been losing blood  intermittently from upper GI tract. Additional contribution factor would be repeated blood  donations.  Recommendations:  Resume ferrous sulfate 325 mg by mouth twice a day. Continue pantoprazole at 40 mg by mouth every morning. Advil OTC 2 tablets by mouth twice a day when necessary with meals. I will be contacting patient with biopsy results. Will plan to check H&H in 4 weeks.  Rogene Houston  01/04/2014 2:23 PM  CC: Dr. Rubbie Battiest, MD & Dr. Rayne Du ref. provider found

## 2014-01-05 ENCOUNTER — Encounter (HOSPITAL_COMMUNITY): Payer: Self-pay | Admitting: Internal Medicine

## 2014-01-06 ENCOUNTER — Encounter: Payer: Self-pay | Admitting: Family Medicine

## 2014-01-06 ENCOUNTER — Ambulatory Visit (INDEPENDENT_AMBULATORY_CARE_PROVIDER_SITE_OTHER): Payer: Medicare Other | Admitting: Family Medicine

## 2014-01-06 VITALS — BP 120/78 | Ht 64.0 in | Wt 130.2 lb

## 2014-01-06 DIAGNOSIS — IMO0002 Reserved for concepts with insufficient information to code with codable children: Secondary | ICD-10-CM

## 2014-01-06 DIAGNOSIS — M810 Age-related osteoporosis without current pathological fracture: Secondary | ICD-10-CM | POA: Diagnosis not present

## 2014-01-06 DIAGNOSIS — D509 Iron deficiency anemia, unspecified: Secondary | ICD-10-CM | POA: Diagnosis not present

## 2014-01-06 NOTE — Progress Notes (Signed)
   Subjective:    Patient ID: Jeanne Rogers, female    DOB: 1941-06-03, 73 y.o.   MRN: [id number]  HPI Patient arrives with continued back pain s/p compression fracture. Patient also seen specialist for anemia-had 2 iron infusions and colonoscopy and EGD. Patient was advised to continue Fe tablets stop ASA and follow up in 4 weeks for them to do H&H. Patients hgb was up to 10.6. Results for orders placed in visit on 12/15/13  CBC      Result Value Ref Range   WBC 5.1  4.0 - 10.5 K/uL   RBC 4.27  3.87 - 5.11 MIL/uL   Hemoglobin 10.6 (*) 12.0 - 15.0 g/dL   HCT 34.2 (*) 36.0 - 46.0 %   MCV 80.1  78.0 - 100.0 fL   MCH 24.8 (*) 26.0 - 34.0 pg   MCHC 31.0  30.0 - 36.0 g/dL   RDW 25.0 (*) 11.5 - 15.5 %   Platelets 527 (*) 150 - 400 K/uL   Back pain awful, hurting more and more Patient using tramadol on a when necessary basis. Not using it very much.  Did not give physical therapy a full chance. Only one a couple of times. Did not seem to help much  In the midst of workup for iron deficiency anemia. Upper endoscopy due soon to know noticeable black stools. No abdominal pain. Dr. Melony Overly advised patient that they could use anti-inflammatories if necessary to Review of Systems No headache no chest pain no abdominal pain. Ongoing significant back pain no urinary changes.    Objective:   Physical Exam Alert vitals stable no acute distress HEENT normal. Lungs clear. Heart regular rate rhythm. Epigastrium no particular tenderness. Low back significant tenderness to palpation. Negative straight leg raise.       Assessment & Plan:  Impression 1 iron deficiency anemia improved. See recent results. Etiology still unclear likely gastritis. #2 severe back pain ongoing and worsening. Discussion held. Definitely can increase tramadol use. Also encouraged strongly to get back with physical therapy plan diet discussed exercise discussed. PT to resume. Followup with GI. Followup ears recommended. 25  minutes spent most in discussion. WSL

## 2014-01-09 ENCOUNTER — Other Ambulatory Visit (INDEPENDENT_AMBULATORY_CARE_PROVIDER_SITE_OTHER): Payer: Self-pay | Admitting: *Deleted

## 2014-01-09 ENCOUNTER — Encounter (INDEPENDENT_AMBULATORY_CARE_PROVIDER_SITE_OTHER): Payer: Self-pay | Admitting: *Deleted

## 2014-01-09 ENCOUNTER — Telehealth (INDEPENDENT_AMBULATORY_CARE_PROVIDER_SITE_OTHER): Payer: Self-pay | Admitting: *Deleted

## 2014-01-09 ENCOUNTER — Encounter (HOSPITAL_COMMUNITY): Payer: Self-pay

## 2014-01-09 DIAGNOSIS — D649 Anemia, unspecified: Secondary | ICD-10-CM

## 2014-01-09 NOTE — Telephone Encounter (Addendum)
Per Dr.Rehman the patient will need to have labs drawn in 3 weeks. 

## 2014-01-09 NOTE — Addendum Note (Signed)
Addended by: Grayland Ormond on: 01/09/2014 11:18 AM   Modules accepted: Orders

## 2014-01-12 ENCOUNTER — Ambulatory Visit (HOSPITAL_COMMUNITY)
Admission: RE | Admit: 2014-01-12 | Discharge: 2014-01-12 | Disposition: A | Discharge status: Home / Self Care | Payer: Medicare Other | Source: Ambulatory Visit | Attending: Family Medicine | Admitting: Family Medicine

## 2014-01-12 DIAGNOSIS — R262 Difficulty in walking, not elsewhere classified: Secondary | ICD-10-CM | POA: Diagnosis not present

## 2014-01-12 DIAGNOSIS — M549 Dorsalgia, unspecified: Secondary | ICD-10-CM | POA: Diagnosis present

## 2014-01-12 DIAGNOSIS — E785 Hyperlipidemia, unspecified: Secondary | ICD-10-CM | POA: Insufficient documentation

## 2014-01-12 DIAGNOSIS — IMO0001 Reserved for inherently not codable concepts without codable children: Secondary | ICD-10-CM | POA: Insufficient documentation

## 2014-01-12 DIAGNOSIS — R293 Abnormal posture: Secondary | ICD-10-CM | POA: Insufficient documentation

## 2014-01-12 DIAGNOSIS — M545 Low back pain, unspecified: Secondary | ICD-10-CM | POA: Diagnosis present

## 2014-01-12 DIAGNOSIS — IMO0002 Reserved for concepts with insufficient information to code with codable children: Secondary | ICD-10-CM | POA: Diagnosis present

## 2014-01-12 NOTE — Evaluation (Signed)
Physical Therapy Evaluation  Patient Details  Name: Jeanne Rogers MRN: [id number] Date of Birth: [birth year]  Today's Date: 01/12/2014 Time: 1600-1650 PT Time Calculation (min): 50 min              Visit#: 1 of 8  Re-eval: 02/11/14 Assessment Diagnosis: Low back pain  Authorization: medicare    Authorization Time Period:    Authorization Visit#: 1 of 8   Past Medical History:  Past Medical History  Diagnosis Date  . Osteoporosis   . Hyperlipidemia   . Collapse, lung   . Spontaneous pneumothorax   . Complication of anesthesia     very sensitive to anesthesia   Past Surgical History:  Past Surgical History  Procedure Laterality Date  . Oophorectomy Bilateral 2005  . Hysterectomy    . Colonoscopy N/A 01/04/2014    Procedure: COLONOSCOPY;  Surgeon: Rogene Houston, MD;  Location: [facility];  Service: Endoscopy;  Laterality: N/A;  200  . Esophagogastroduodenoscopy N/A 01/04/2014    Procedure: ESOPHAGOGASTRODUODENOSCOPY (EGD);  Surgeon: Rogene Houston, MD;  Location: [facility];  Service: Endoscopy;  Laterality: N/A;    Subjective Symptoms/Limitations Symptoms: Jeanne Rogers states that she had a compression fx months ago and has had pain ever since.  She has more pain with sitting and standing.  She is having increased pain with getting in and out of cars as well as going from sit to stand.  Pertinent History: positive scoliosis How long can you sit comfortably?: with back support for a couple of hours.  How long can you stand comfortably?: varies.  How long can you walk comfortably?: Pt use to walk for exercises but is not doing this any longer  Pain Assessment Currently in Pain?: Yes Pain Score: 4  Pain Location: Back Pain Orientation: Lower Pain Type: Chronic pain  Precautions/Restrictions     Balance Screening    Prior Functioning     Cognition/Observation    Sensation/Coordination/Flexibility/Functional Tests Functional Tests Functional Tests:  foto  Assessment LLE Strength Left Hip Flexion: 5/5 Left Hip Extension: 3+/5 Left Hip ABduction: 5/5 Left Knee Flexion: 5/5 Left Knee Extension: 5/5 Lumbar AROM Lumbar Flexion: increases pain with return.decreased 25% Lumbar Extension: wnl  Lumbar - Right Side Bend: wnl Lumbar - Left Side Bend: decreased 20%  Lumbar - Right Rotation: decreased 20% Lumbar - Left Rotation: wnl  Exercise/Treatments Mobility/Balance  Posture/Postural Control Posture/Postural Control: Postural limitations Postural Limitations: increased kyphosis decreased lordosis     Supine Ab Set: 5 reps Bridge: 5 reps Other Supine Lumbar Exercises: decompression 1-5   Physical Therapy Assessment and Plan PT Assessment and Plan Clinical Impression Statement: Jeanne Rogers is a 73 yo female with hx of compression fx, scoliosis who has increased scoliosis and decreased scoliosis who has chronic complaint of low back pain.  Pt has been referred to PT to decrease her pain by addressing the above issues.  Jeanne Rogers will benefit from skilled therapy to educate in an exercise program for osteopososis as well as improving her posture and core mm. Pt will benefit from skilled therapeutic intervention in order to improve on the following deficits: Decreased activity tolerance;Difficulty walking;Pain;Decreased strength;Decreased range of motion Rehab Potential: Good PT Frequency: Min 2X/week PT Duration: 4 weeks PT Treatment/Interventions: Patient/family education;Therapeutic activities;Therapeutic exercise;Manual techniques PT Plan: begin t-band decompression exercises, w-back cervical retraction, x to V as well as core strengthening exercises    Goals Home Exercise Program Pt/caregiver will Perform Home Exercise Program: For increased strengthening;For increased  ROM PT Short Term Goals Time to Complete Short Term Goals: 2 weeks PT Short Term Goal 1: Pt pain to be no greater than a 4/10 PT Short Term Goal 2: Pt to be walking  for 15 minute for better health habits PT Short Term Goal 3: PT to be more aware of her sitting and standing posutre and to be able to verbalize the importance in back care.  PT Short Term Goal 4: pt to be able to complete light housework without increased pain PT Long Term Goals Time to Complete Long Term Goals: 4 weeks PT Long Term Goal 1: Pain no greater  than a 2 PT Long Term Goal 2: Pt to be able to complete moderate activity, carrying groceries, laundry without increased pain Long Term Goal 3: Pt to be walking for 30 minutes for better health habitis.  Long Term Goal 4: I in advance HEP  Problem List Patient Active Problem List   Diagnosis Date Noted  . Postural imbalance 01/12/2014  . Low back pain 10/13/2013  . Compression fracture 07/26/2013  . Unspecified vitamin D deficiency 07/26/2013  . Osteoporosis 06/29/2013  . Back pain 06/29/2013    PT Plan of Care PT Home Exercise Plan: given   GP Functional Assessment Tool Used: foto Functional Limitation: Mobility: Walking and moving around Mobility: Walking and Moving Around Current Status ([id number]): At least 40 percent but less than 60 percent impaired, limited or restricted Mobility: Walking and Moving Around Goal Status [phone]): At least 20 percent but less than 40 percent impaired, limited or restricted  Marysol Wellnitz,CINDY 01/12/2014, 5:32 PM  Physician Documentation Your signature is required to indicate approval of the treatment plan as stated above.  Please sign and either send electronically or make a copy of this report for your files and return this physician signed original.   Please mark one 1.__approve of plan  2. ___approve of plan with the following conditions.   ______________________________                                                          _____________________ Physician Signature                                                                                                             Date

## 2014-01-15 DIAGNOSIS — D509 Iron deficiency anemia, unspecified: Secondary | ICD-10-CM | POA: Insufficient documentation

## 2014-01-16 ENCOUNTER — Ambulatory Visit (HOSPITAL_COMMUNITY)
Admission: RE | Admit: 2014-01-16 | Discharge: 2014-01-16 | Disposition: A | Discharge status: Home / Self Care | Payer: Medicare Other | Source: Ambulatory Visit | Attending: Family Medicine | Admitting: Family Medicine

## 2014-01-16 NOTE — Progress Notes (Signed)
Physical Therapy Treatment Patient Details  Name: Jeanne Rogers MRN: [id number] Date of Birth: [birth year]  Today's Date: 01/16/2014 Time: 0383-3383 PT Time Calculation (min): 44 min   Visit#: 2 of 8  Re-eval: 02/11/14    Authorization:    Authorization Time Period:    Authorization Visit#: 2 of 8   Subjective: Symptoms/Limitations Symptoms: Patient has no complaints today or" maybe I'm just used to the pain" she states  Precautions/Restrictions     Exercise/Treatments    Standing Exercises Neck Retraction: 5 reps;Limitations (verbal/tactile cueing needed) Seated Other Seated Lumbar Exercises: x to v x10 Supine Ab Set: Limitations (Patient unable to produce TA contraction on own) Bridge: 10 reps Other Supine Lumbar Exercises: decompression 1-5  Other Supine Lumbar Exercises: decompression with gband 1-4       Physical Therapy Assessment and Plan PT Assessment and Plan Clinical Impairments Affecting Rehab Potential: Lots of education required for muscle activatation and review of HEP as well as addition of new exercises. Patient trouble understanding and isolating specific muscles/motions (TA, cervical retraction  Added gtband decompression exercises, x to v and only cervical retraction without W yet, too confusing for patient. PT Plan: Continue with PT POC: add W c retraction once cervical retraction is mastered)    Goals    Problem List Patient Active Problem List   Diagnosis Date Noted  . Anemia, iron deficiency 01/15/2014  . Postural imbalance 01/12/2014  . Low back pain 10/13/2013  . Compression fracture 07/26/2013  . Unspecified vitamin D deficiency 07/26/2013  . Osteoporosis 06/29/2013  . Back pain 06/29/2013    PT - End of Session Activity Tolerance: Patient tolerated treatment well General Behavior During Therapy: [facility] for tasks assessed/performed  GP    Ambriana Selway, [place] 01/16/2014, 3:33 PM

## 2014-01-23 ENCOUNTER — Other Ambulatory Visit: Payer: Self-pay

## 2014-01-23 MED ORDER — PANTOPRAZOLE SODIUM 40 MG PO TBEC: 40.0000 mg | DELAYED_RELEASE_TABLET | Freq: Every day | ORAL | Status: DC

## 2014-01-24 ENCOUNTER — Ambulatory Visit (HOSPITAL_COMMUNITY)
Admission: RE | Admit: 2014-01-24 | Discharge: 2014-01-24 | Disposition: A | Discharge status: Home / Self Care | Payer: Medicare Other | Source: Ambulatory Visit | Attending: Family Medicine | Admitting: Family Medicine

## 2014-01-24 NOTE — Progress Notes (Signed)
Physical Therapy Treatment Patient Details  Name: Jeanne Rogers MRN: [id number] Date of Birth: 31-Aug-1940  Today's Date: 01/24/2014 Time: 1022-1103 PT Time Calculation (min): 41 min  TE 1022-1052 Massage [id number]   Visit#: 3 of 8  Re-eval: 02/11/14    Authorization: medicare  Authorization Time Period:    Authorization Visit#:   of 8   Subjective: Symptoms/Limitations Symptoms: Patient states 3/10 pain r hip which has been aggrevated past week L hip feeling better; patient states she is still unable to isolate TA for HEP Pain Assessment Currently in Pain?: Yes Pain Score: 3  Pain Location: Hip Pain Orientation: Right  Exercise/Treatments    Stretches Active Hamstring Stretch:  (rope;supine) Passive Hamstring Stretch: 3 reps;30 seconds Single Knee to Chest Stretch: 3 reps;30 seconds;Limitations (bilateral)   Seated Other Seated Lumbar Exercises: x to v x10 Other Seated Lumbar Exercises: nn Supine Bridge: 10 reps Large Ball Abdominal Isometric: 10 reps;5 seconds;Limitations (replace ab set;pt unable to isolate TA alone) Other Supine Lumbar Exercises: decompression 1-5   Manual Therapy Manual Therapy: Massage Massage: massage to low back with focus on r posterior hip area where complaint of tightness and pain present  Physical Therapy Assessment and Plan PT Assessment and Plan Clinical Impression Statement: Ms. Beshara is still unable to intiate TA contraction on own with many different tactile and verbal cues, large ball abdominal isometric begun to replace "ab sets" Masssage also performed today to relax tightness and pain of r hip posterior hip area. Patient states decreased pain post session.. Also added streteches for better back hip mobility., very tight hamstringstaken  PT Plan: Continue with PT POC: continue again with cervical retraction    Goals    Problem List Patient Active Problem List   Diagnosis Date Noted  . Anemia, iron deficiency 01/15/2014   . Postural imbalance 01/12/2014  . Low back pain 10/13/2013  . Compression fracture 07/26/2013  . Unspecified vitamin D deficiency 07/26/2013  . Osteoporosis 06/29/2013  . Back pain 06/29/2013    PT - End of Session Activity Tolerance: Patient tolerated treatment well  GP    EAGLETON, [place] 01/24/2014, 3:10 PM

## 2014-01-26 ENCOUNTER — Ambulatory Visit (HOSPITAL_COMMUNITY)
Admission: RE | Admit: 2014-01-26 | Discharge: 2014-01-26 | Disposition: A | Discharge status: Home / Self Care | Payer: Medicare Other | Source: Ambulatory Visit | Attending: Family Medicine | Admitting: Family Medicine

## 2014-01-26 NOTE — Progress Notes (Signed)
Physical Therapy Treatment Patient Details  Name: Jeanne Rogers MRN: [id number] Date of Birth: 07/28/1941  Today's Date: 01/26/2014 Time: 1020-1106 PT Time Calculation (min): 46 min  TE 1020-1056 Manual 1056-1106   Visit#: 4 of 8  Re-eval:      Authorization:    Authorization Time Period:    Authorization Visit#: 4 of 8   Subjective: Symptoms/Limitations Symptoms: Patient states after last session she felt great after manual and stretches which she continued at home. Today she still presents with 3/10 R low posterior hip "burning pain" though. She states she feels optimistic PT is helping Pain Assessment Pain Score: 3  Pain Location: Hip Pain Orientation: Posterior;Right      Exercise/Treatments      Stretches Active Hamstring Stretch: 3 reps;30 seconds Single Knee to Chest Stretch: 3 reps;30 seconds;Limitations    Seated Other Seated Lumbar Exercises: x to v x10 Supine Bridge: 10 reps;5 seconds Large Ball Abdominal Isometric: 10 reps;5 seconds;Limitations Large Ball Oblique Isometric: 10 reps Other Supine Lumbar Exercises: decompression 1-5     Manual Therapy Manual Therapy: Massage Massage: massaage to low back with focus on R posterior hip area where complaint of tightness and pain present  Physical Therapy Assessment and Plan PT Assessment and Plan Clinical Impression Statement: Patient was able to isolate a TA contraction today on own which is progress, still having a lot of difficulty with cervical retraction, patient wanting to flex. Added  large ball oblique isometrics today. Patient began stretches at home, but required reeducation on active hamstring stretch. Patient requires lots of education for new exercises and multimodal cueing for exercises each week. PT Plan: Continue with PT POC: continue again with cervical retraction add W once retraction mastered    Goals    Problem List Patient Active Problem List   Diagnosis Date Noted  . Anemia,  iron deficiency 01/15/2014  . Postural imbalance 01/12/2014  . Low back pain 10/13/2013  . Compression fracture 07/26/2013  . Unspecified vitamin D deficiency 07/26/2013  . Osteoporosis 06/29/2013  . Back pain 06/29/2013    PT - End of Session Activity Tolerance: Patient tolerated treatment well  GP    EAGLETON, [place] 01/26/2014, 1:35 PM

## 2014-01-30 ENCOUNTER — Ambulatory Visit (HOSPITAL_COMMUNITY)
Admission: RE | Admit: 2014-01-30 | Discharge: 2014-01-30 | Disposition: A | Discharge status: Home / Self Care | Payer: Medicare Other | Source: Ambulatory Visit | Attending: Family Medicine | Admitting: Family Medicine

## 2014-01-30 DIAGNOSIS — R293 Abnormal posture: Secondary | ICD-10-CM

## 2014-01-30 DIAGNOSIS — D649 Anemia, unspecified: Secondary | ICD-10-CM | POA: Diagnosis not present

## 2014-01-30 LAB — CBC
HEMATOCRIT: 41.3 % (ref 36.0–46.0)
Hemoglobin: 13.4 g/dL (ref 12.0–15.0)
MCH: 27.6 pg (ref 26.0–34.0)
MCHC: 32.4 g/dL (ref 30.0–36.0)
MCV: 85 fL (ref 78.0–100.0)
PLATELETS: 313 10*3/uL (ref 150–400)
RBC: 4.86 MIL/uL (ref 3.87–5.11)
RDW: 23.3 % — ABNORMAL HIGH (ref 11.5–15.5)
WBC: 5.2 10*3/uL (ref 4.0–10.5)

## 2014-01-30 NOTE — Progress Notes (Signed)
Physical Therapy Treatment Patient Details  Name: Jeanne Rogers MRN: [id number] Date of Birth: Oct 16, 1940  Today's Date: 01/30/2014 Time: 9211-9417 PT Time Calculation (min): 45 min Charge:  There ex 855-930; manual 930-940 Visit#: 5 of 8  Re-eval: 02/11/14    Authorization: medicare   Authorization Visit#: 5 of 8   Subjective: Symptoms/Limitations Symptoms: Pt is much better she states that her pain is only at a 1 or 2 today. Pain Assessment Pain Score: 2    Exercise/Treatments    nustep L 4 x 8 min. On hills  Stretches Active Hamstring Stretch: 3 reps;30 seconds Single Knee to Chest Stretch: 3 reps;30 seconds Prone on Elbows Stretch: 1 rep;60 seconds Supine Bridge: 10 reps Straight Leg Raise: 10 reps;Limitations Straight Leg Raises Limitations: floating Other Supine Lumbar Exercises: axial extension. Sidelying Clam: 10 reps Hip Abduction: 10 reps Prone  Straight Leg Raise: 10 reps Other Prone Lumbar Exercises: scapular retraction     Manual Therapy Manual Therapy: Massage Massage: To low back with focus on Rt side   Physical Therapy Assessment and Plan PT Assessment and Plan Clinical Impression Statement: Pt improving with cervical retraction.  Progressed pt to sidelying and prone exercises today.  Pt will benefit from prone exercises to strengthen extensor mm. Pt is improving in coordination and mm recruitment. Pt will benefit from skilled therapeutic intervention in order to improve on the following deficits: Decreased activity tolerance;Difficulty walking;Pain;Decreased strength;Decreased range of motion Clinical Impairments Affecting Rehab Potential: Continue to progress pt; begin prone shoulder extension, prone w-back exercises.          Problem List Patient Active Problem List   Diagnosis Date Noted  . Anemia, iron deficiency 01/15/2014  . Postural imbalance 01/12/2014  . Low back pain 10/13/2013  . Compression fracture 07/26/2013  .  Unspecified vitamin D deficiency 07/26/2013  . Osteoporosis 06/29/2013  . Back pain 06/29/2013       GP    Lylith Bebeau,CINDY 01/30/2014, 10:40 AM

## 2014-01-31 ENCOUNTER — Telehealth (INDEPENDENT_AMBULATORY_CARE_PROVIDER_SITE_OTHER): Payer: Self-pay | Admitting: *Deleted

## 2014-01-31 DIAGNOSIS — D509 Iron deficiency anemia, unspecified: Secondary | ICD-10-CM

## 2014-01-31 NOTE — Telephone Encounter (Signed)
Per Dr.Rehman the patient will need to have labs drawn in 3 months noted for May 03 2014.

## 2014-02-02 ENCOUNTER — Ambulatory Visit (HOSPITAL_COMMUNITY)
Admission: RE | Admit: 2014-02-02 | Discharge: 2014-02-02 | Disposition: A | Discharge status: Home / Self Care | Payer: Medicare Other | Source: Ambulatory Visit | Attending: Family Medicine | Admitting: Family Medicine

## 2014-02-02 DIAGNOSIS — M545 Low back pain, unspecified: Secondary | ICD-10-CM | POA: Insufficient documentation

## 2014-02-02 DIAGNOSIS — R293 Abnormal posture: Secondary | ICD-10-CM

## 2014-02-02 DIAGNOSIS — IMO0001 Reserved for inherently not codable concepts without codable children: Secondary | ICD-10-CM | POA: Diagnosis not present

## 2014-02-02 NOTE — Progress Notes (Signed)
Physical Therapy Treatment Patient Details  Name: Jeanne Rogers MRN: [id number] Date of Birth: [birth year]  Today's Date: 02/02/2014 Time: 1020-1105 PT Time Calculation (min): 45 min Charge there ex 1020-1105 Visit#: 6 of 8  Re-eval: 02/11/14    Authorization: medicare   Authorization Visit#: 6 of 8   Subjective: Symptoms/Limitations Symptoms: Pt states her pain has been up yesterday and today.   Pain Assessment Currently in Pain?: Yes Pain Score: 5  Pain Location: Back Pain Orientation: Right Pain Type: Chronic pain  Exercise/Treatments   Stretches Standing Extension: 5 reps Press Ups: 2 reps;60 seconds Standing Other Standing Lumbar Exercises: wall pushup x 10 Other Standing Lumbar Exercises: chest stretch x 10   Supine Bridge: 15 reps Sidelying Clam: 10 reps Hip Abduction: 10 reps Prone  Straight Leg Raise: 10 reps Opposite Arm/Leg Raise: 10 reps Other Prone Lumbar Exercises: w -back, rows, and shld extension x s10    Physical Therapy Assessment and Plan PT Assessment and Plan Clinical Impression Statement: Pt had decreased pain,0/10, after session.  Pt had new exercises added to increase strength and stability of core mm. All exercies required mutimodal cuing from therapist to ensure proper technique.  PT Plan: begin standing activites.    Goals  progressing  Problem List Patient Active Problem List   Diagnosis Date Noted  . Anemia, iron deficiency 01/15/2014  . Postural imbalance 01/12/2014  . Low back pain 10/13/2013  . Compression fracture 07/26/2013  . Unspecified vitamin D deficiency 07/26/2013  . Osteoporosis 06/29/2013  . Back pain 06/29/2013       GP    Daisee Centner,CINDY 02/02/2014, 11:07 AM

## 2014-02-07 ENCOUNTER — Ambulatory Visit (HOSPITAL_COMMUNITY)
Admission: RE | Admit: 2014-02-07 | Discharge: 2014-02-07 | Disposition: A | Discharge status: Home / Self Care | Payer: Medicare Other | Source: Ambulatory Visit | Attending: Family Medicine | Admitting: Family Medicine

## 2014-02-07 DIAGNOSIS — M545 Low back pain, unspecified: Secondary | ICD-10-CM | POA: Diagnosis not present

## 2014-02-07 DIAGNOSIS — IMO0001 Reserved for inherently not codable concepts without codable children: Secondary | ICD-10-CM | POA: Diagnosis not present

## 2014-02-07 NOTE — Progress Notes (Signed)
Physical Therapy Treatment Patient Details  Name: Jeanne Rogers MRN: [id number] Date of Birth: [birth year]  Today's Date: 02/07/2014 Time: 9357-0177 PT Time Calculation (min): 55 min Visit#: 7 of 8  Charges:  therex 50'  Subjective: Symptoms/Limitations Symptoms: Pt states she's having a little pain today.  Reportes complaince with her HEP.  Currently 4/10 pain. Pain Assessment Currently in Pain?: Yes Pain Score: 4  Pain Location: Back Pain Orientation: Right   Exercise/Treatments Stretches Active Hamstring Stretch: 3 reps;30 seconds Aerobic Stationary Bike: nustep L3 hills #2 8 minutes Standing Functional Squats: 10 reps;Limitations Functional Squats Limitations: hip excursions Forward Lunge: 10 reps;Limitations Forward Lunge Limitations: 4" box Scapular Retraction: 10 reps;Theraband Theraband Level (Scapular Retraction): Level 3 (Green) Row: 10 reps;Theraband Theraband Level (Row): Level 3 (Green) Shoulder Extension: 10 reps;Theraband Theraband Level (Shoulder Extension): Level 3 (Green) Other Standing Lumbar Exercises: wall pushup x 10 Other Standing Lumbar Exercises: corner stretch x 10 Supine Bridge: 15 reps Straight Leg Raise: 10 reps;Limitations Straight Leg Raises Limitations: floating Sidelying Clam: 10 reps Hip Abduction: 10 reps Prone  Straight Leg Raise: 10 reps Opposite Arm/Leg Raise: 10 reps Other Prone Lumbar Exercises: w -back, rows, and shld extension x s10     Physical Therapy Assessment and Plan PT Assessment and Plan Clinical Impression Statement: Reviewed mat exericses today with patient per request to insure proper form.  Pt with most difficulty with prone exercises.   Pt may need one more session of review before independent.  Extremely tight hamstrings.  Pt instructed alternate ways of completing Hamstring stretch both in standing and long sitting.   Progressed with theraband exercises to strengthen postural muscles.   Noted difficulty  stabilizing core with this activity.  Pt also required therapist facilitation to complete squats correctly.   PT Plan: Progress postural and core stability.  Continue to advance standing exercises.   Re-evaluate next visit.   Problem List Patient Active Problem List   Diagnosis Date Noted  . Anemia, iron deficiency 01/15/2014  . Postural imbalance 01/12/2014  . Low back pain 10/13/2013  . Compression fracture 07/26/2013  . Unspecified vitamin D deficiency 07/26/2013  . Osteoporosis 06/29/2013  . Back pain 06/29/2013       Teena Irani, PTA/CLT 02/07/2014, 1:22 PM

## 2014-02-14 ENCOUNTER — Ambulatory Visit (HOSPITAL_COMMUNITY)
Admission: RE | Admit: 2014-02-14 | Discharge: 2014-02-14 | Disposition: A | Discharge status: Home / Self Care | Payer: Medicare Other | Source: Ambulatory Visit | Attending: Family Medicine | Admitting: Family Medicine

## 2014-02-14 DIAGNOSIS — M545 Low back pain, unspecified: Secondary | ICD-10-CM | POA: Diagnosis not present

## 2014-02-14 DIAGNOSIS — IMO0001 Reserved for inherently not codable concepts without codable children: Secondary | ICD-10-CM | POA: Diagnosis not present

## 2014-02-14 DIAGNOSIS — R293 Abnormal posture: Secondary | ICD-10-CM

## 2014-02-14 NOTE — Evaluation (Signed)
Physical Therapy Reassessment/Discharge.  Patient Details  Name: Jeanne Rogers MRN: [id number] Date of Birth: 1941/06/02  Today's Date: 02/14/2014 Time: 1015-1100 PT Time Calculation (min): 45 min Charge:  Mm test 3845-3646; there ex [phone]; self care [phone]             Visit#: 8 of 8  Re-eval: 02/11/14 Assessment Diagnosis: Low back pain Prior Therapy: 2005 - low back muscle pain  Authorization: medicare    Authorization Visit#: 8 of 8   Past Medical History:  Past Medical History  Diagnosis Date  . Osteoporosis   . Hyperlipidemia   . Collapse, lung   . Spontaneous pneumothorax   . Complication of anesthesia     very sensitive to anesthesia   Past Surgical History:  Past Surgical History  Procedure Laterality Date  . Oophorectomy Bilateral 2005  . Hysterectomy    . Colonoscopy N/A 01/04/2014    Procedure: COLONOSCOPY;  Surgeon: Rogene Houston, MD;  Location: [facility];  Service: Endoscopy;  Laterality: N/A;  200  . Esophagogastroduodenoscopy N/A 01/04/2014    Procedure: ESOPHAGOGASTRODUODENOSCOPY (EGD);  Surgeon: Rogene Houston, MD;  Location: [facility];  Service: Endoscopy;  Laterality: N/A;    Subjective Symptoms/Limitations Symptoms: Pt states that she is clear on how to complete her exercise.  She is doing better at least 50% better.  How long can you sit comfortably?: able to sit for an hour without back support  How long can you stand comfortably?: able to stand for 45 minutes  How long can you walk comfortably?: Pt is walking for 30 minutes at a time/ three times a week.  Pain Assessment Currently in Pain?: Yes (worst  8/10; best 1/10 ) Pain Score: 2  Pain Location: Back Pain Orientation: Right Pain Type: Chronic pain   Prior Functioning  Prior Function Level of Independence: Independent with basic ADLs  Cognition/Observation Cognition Overall Cognitive Status: Within Functional Limits for tasks  assessed  Sensation/Coordination/Flexibility/Functional Tests Sensation Light Touch: Appears Intact Coordination Gross Motor Movements are Fluid and Coordinated: Yes    Exercise/Treatments   Stretches Passive Hamstring Stretch: 3 reps;30 seconds Press Ups: 5 reps   Prone  Straight Leg Raise: 10 reps Opposite Arm/Leg Raise: 10 reps Other Prone Lumbar Exercises: w -back, rows, and shld extension x s10  Physical Therapy Assessment and Plan PT Assessment and Plan Clinical Impression Statement: Pt has improved significantly since initial evaluation.  Pt has had chronic Low back pain and will ned time to achieve full strength but has all the information needed to achieve this at this time.  Pt was encouraged to join silver sneakers.   PT Plan: discharge to [place] Exercise Program Pt/caregiver will Perform Home Exercise Program: For increased strengthening;For increased ROM PT Short Term Goals Time to Complete Short Term Goals: 2 weeks PT Short Term Goal 1: Pt pain to be no greater than a 4/10 PT Short Term Goal 1 - Progress: Progressing toward goal PT Short Term Goal 2: Pt to be walking for 15 minute for better health habits PT Short Term Goal 2 - Progress: Met PT Short Term Goal 3: PT to be more aware of her sitting and standing posutre and to be able to verbalize the importance in back care.  PT Short Term Goal 3 - Progress: Met PT Short Term Goal 4: pt to be able to complete light housework without increased pain PT Short Term Goal 4 - Progress: Met PT  Long Term Goals Time to Complete Long Term Goals: 4 weeks PT Long Term Goal 1: Pain no greater  than a 2 PT Long Term Goal 1 - Progress: Progressing toward goal PT Long Term Goal 2: Pt to be able to complete moderate activity, carrying groceries, laundry without increased pain PT Long Term Goal 2 - Progress: Progressing toward goal Long Term Goal 3: Pt to be walking for 30 minutes for better health habitis.  Long  Term Goal 3 Progress: Progressing toward goal Long Term Goal 4: I in advance HEP Long Term Goal 4 Progress: Met  Problem List Patient Active Problem List   Diagnosis Date Noted  . Anemia, iron deficiency 01/15/2014  . Postural imbalance 01/12/2014  . Low back pain 10/13/2013  . Compression fracture 07/26/2013  . Unspecified vitamin D deficiency 07/26/2013  . Osteoporosis 06/29/2013  . Back pain 06/29/2013    PT Plan of Care PT Home Exercise Plan: new given to cover all exercises given to pt  GP Functional Assessment Tool Used: foto Mobility: Walking and Moving Around Current Status [phone]): At least 40 percent but less than 60 percent impaired, limited or restricted Mobility: Walking and Moving Around Goal Status [phone]): At least 20 percent but less than 40 percent impaired, limited or restricted  Tatem Holsonback,CINDY 02/14/2014, 11:12 AM  Physician Documentation Your signature is required to indicate approval of the treatment plan as stated above.  Please sign and either send electronically or make a copy of this report for your files and return this physician signed original.   Please mark one 1.__approve of plan  2. ___approve of plan with the following conditions.   ______________________________                                                          _____________________ Physician Signature                                                                                                             Date

## 2014-03-22 ENCOUNTER — Encounter (INDEPENDENT_AMBULATORY_CARE_PROVIDER_SITE_OTHER): Payer: Self-pay | Admitting: *Deleted

## 2014-03-22 ENCOUNTER — Other Ambulatory Visit (INDEPENDENT_AMBULATORY_CARE_PROVIDER_SITE_OTHER): Payer: Self-pay | Admitting: *Deleted

## 2014-03-22 DIAGNOSIS — D509 Iron deficiency anemia, unspecified: Secondary | ICD-10-CM

## 2014-04-11 DIAGNOSIS — D509 Iron deficiency anemia, unspecified: Secondary | ICD-10-CM | POA: Diagnosis not present

## 2014-04-11 LAB — CBC
HCT: 42.4 % (ref 36.0–46.0)
HEMOGLOBIN: 14.1 g/dL (ref 12.0–15.0)
MCH: 29.5 pg (ref 26.0–34.0)
MCHC: 33.3 g/dL (ref 30.0–36.0)
MCV: 88.7 fL (ref 78.0–100.0)
Platelets: 335 10*3/uL (ref 150–400)
RBC: 4.78 MIL/uL (ref 3.87–5.11)
RDW: 14.6 % (ref 11.5–15.5)
WBC: 5.8 10*3/uL (ref 4.0–10.5)

## 2014-04-11 LAB — FERRITIN: Ferritin: 100 ng/mL (ref 10–291)

## 2014-04-17 ENCOUNTER — Encounter: Payer: Self-pay | Admitting: Family Medicine

## 2014-04-17 ENCOUNTER — Ambulatory Visit (INDEPENDENT_AMBULATORY_CARE_PROVIDER_SITE_OTHER): Payer: Medicare Other | Admitting: Family Medicine

## 2014-04-17 VITALS — BP 120/78 | Ht 64.0 in | Wt 131.0 lb

## 2014-04-17 DIAGNOSIS — M799 Soft tissue disorder, unspecified: Secondary | ICD-10-CM

## 2014-04-17 DIAGNOSIS — D509 Iron deficiency anemia, unspecified: Secondary | ICD-10-CM | POA: Diagnosis not present

## 2014-04-17 DIAGNOSIS — Z23 Encounter for immunization: Secondary | ICD-10-CM

## 2014-04-17 DIAGNOSIS — M810 Age-related osteoporosis without current pathological fracture: Secondary | ICD-10-CM

## 2014-04-17 DIAGNOSIS — R293 Abnormal posture: Secondary | ICD-10-CM

## 2014-04-17 MED ORDER — ZOLPIDEM TARTRATE 10 MG PO TABS: 10.0000 mg | ORAL_TABLET | Freq: Every evening | ORAL | Status: DC | PRN

## 2014-04-17 NOTE — Progress Notes (Signed)
   Subjective:    Patient ID: Jeanne Rogers, female    DOB: Dec 07, 1940, 73 y.o.   MRN: [id number]  HPI Patient is here today for a follow up visit & to go la results  . Patient has no other concerns.  Patient actually arrives office with several concerns. At times severe and insomnia. Has taken Ambien in the past and helped. Would like to use only on occasion.  Had severe iron deficiency anemia. Felt likely due to gastritis along with frequent blood sedation. Dr. mild has told her she can now take 2 or 3 iron tablets per day.  Patient notes her fatigue has improved considerably.  Back pain and compression fracture site has improved after physical therapy assessment. Trying to watch her own exercise in this regard.  Results for orders placed in visit on 03/22/14  CBC      Result Value Ref Range   WBC 5.8  4.0 - 10.5 K/uL   RBC 4.78  3.87 - 5.11 MIL/uL   Hemoglobin 14.1  12.0 - 15.0 g/dL   HCT 42.4  36.0 - 46.0 %   MCV 88.7  78.0 - 100.0 fL   MCH 29.5  26.0 - 34.0 pg   MCHC 33.3  30.0 - 36.0 g/dL   RDW 14.6  11.5 - 15.5 %   Platelets 335  150 - 400 K/uL  FERRITIN      Result Value Ref Range   Ferritin 100  10 - 291 ng/mL    Ongoing challenges with  Standard adult dose is ten mg    Energy rebound    Review of Systems No headache no chest pain no abdominal pain no change in bowel habits no blood in stool no rash ROS otherwise negative.    Objective:   Physical Exam Alert vitals stable. HEENT normal. Lungs clear. Heart rare rhythm. Back minimal tenderness to palpation. Abdomen benign. Ankles edema.       Assessment & Plan:  Impression 1 iron deficiency anemia much improved discussed #2 insomnia discussed #3 back pain improving plan maintain exercises. Add Ambien when necessary. Go ahead and take a couple iron tablets per week as per Dr. Sharlene Dory recommendations along with daily multivitamin. We will check CBC and ferritin another 6 months. WSL

## 2014-05-09 NOTE — Addendum Note (Signed)
Encounter addended by: Debby Bud, OT on: 05/09/2014  9:54 AM<BR>     Documentation filed: Episodes

## 2014-05-24 ENCOUNTER — Ambulatory Visit (INDEPENDENT_AMBULATORY_CARE_PROVIDER_SITE_OTHER): Payer: Medicare Other | Admitting: Nurse Practitioner

## 2014-05-24 ENCOUNTER — Encounter: Payer: Self-pay | Admitting: Nurse Practitioner

## 2014-05-24 ENCOUNTER — Ambulatory Visit (HOSPITAL_COMMUNITY)
Admission: RE | Admit: 2014-05-24 | Discharge: 2014-05-24 | Disposition: A | Discharge status: Home / Self Care | Payer: Medicare Other | Source: Ambulatory Visit | Attending: Nurse Practitioner | Admitting: Nurse Practitioner

## 2014-05-24 VITALS — BP 120/80 | Ht 64.0 in | Wt 131.0 lb

## 2014-05-24 DIAGNOSIS — Z23 Encounter for immunization: Secondary | ICD-10-CM

## 2014-05-24 DIAGNOSIS — M25571 Pain in right ankle and joints of right foot: Secondary | ICD-10-CM | POA: Insufficient documentation

## 2014-05-24 DIAGNOSIS — S80212A Abrasion, left knee, initial encounter: Secondary | ICD-10-CM

## 2014-05-24 MED ORDER — TETANUS-DIPHTH-ACELL PERTUSSIS 5-2.5-18.5 LF-MCG/0.5 IM SUSP: 0.5000 mL | Freq: Once | INTRAMUSCULAR | Status: DC

## 2014-05-26 NOTE — Progress Notes (Signed)
Patient notified and verbalized understanding of the test results. No further questions. 

## 2014-05-29 ENCOUNTER — Encounter: Payer: Self-pay | Admitting: Nurse Practitioner

## 2014-05-29 NOTE — Progress Notes (Signed)
Subjective:  Presents for right ankle pain after tripping and falling yesterday. Much improved is able to put weight on foot. Swelling improved. Has been wearing an ankle support. Also small abrasion left knee from fall. No significant knee pain. Overdue for tetanus update.  Objective:   BP 120/80  Ht 5\' 4"  (1.626 m)  Wt 131 lb (59.421 kg)  BMI 22.47 kg/m2 NAD. Alert, oriented. Husband present. Right ankle mild edema noted around right malleolus. Moderately tender to palpation. Some tenderness with inversion. Strong DP pulse and normal cap refill. No joint laxity. Superficial abrastion noted near left patella. No signs of infection.   Assessment: Right ankle pain - Plan: DG Ankle Complete Right, DG Ankle Complete Right  Knee abrasion, left, initial encounter - Plan: Td vaccine greater than or equal to 7yo preservative free IM  Need for vaccination - Plan: Pneumococcal conjugate vaccine 13-valent, DISCONTINUED: Tdap (BOOSTRIX) injection 0.5 mL  Plan: NSAIDs as directed. Ice applications and elevation. Because of hx of osteoporosis, will obtain an ankle xray. Call back if worsens or persists.

## 2014-06-09 ENCOUNTER — Other Ambulatory Visit: Payer: Self-pay | Admitting: *Deleted

## 2014-06-09 MED ORDER — ALENDRONATE SODIUM 70 MG PO TABS: ORAL_TABLET | ORAL | Status: DC

## 2014-07-20 ENCOUNTER — Other Ambulatory Visit: Payer: Self-pay | Admitting: Family Medicine

## 2014-07-20 DIAGNOSIS — X32XXXA Exposure to sunlight, initial encounter: Secondary | ICD-10-CM | POA: Diagnosis not present

## 2014-07-20 DIAGNOSIS — D225 Melanocytic nevi of trunk: Secondary | ICD-10-CM | POA: Diagnosis not present

## 2014-07-20 DIAGNOSIS — D485 Neoplasm of uncertain behavior of skin: Secondary | ICD-10-CM | POA: Diagnosis not present

## 2014-07-20 DIAGNOSIS — Z85828 Personal history of other malignant neoplasm of skin: Secondary | ICD-10-CM | POA: Diagnosis not present

## 2014-07-20 DIAGNOSIS — L570 Actinic keratosis: Secondary | ICD-10-CM | POA: Diagnosis not present

## 2014-07-20 DIAGNOSIS — Z08 Encounter for follow-up examination after completed treatment for malignant neoplasm: Secondary | ICD-10-CM | POA: Diagnosis not present

## 2014-07-20 DIAGNOSIS — L98499 Non-pressure chronic ulcer of skin of other sites with unspecified severity: Secondary | ICD-10-CM | POA: Diagnosis not present

## 2014-09-20 ENCOUNTER — Other Ambulatory Visit: Payer: Self-pay | Admitting: Family Medicine

## 2014-09-20 DIAGNOSIS — Z1231 Encounter for screening mammogram for malignant neoplasm of breast: Secondary | ICD-10-CM

## 2014-09-22 ENCOUNTER — Other Ambulatory Visit: Payer: Self-pay | Admitting: Family Medicine

## 2014-09-25 ENCOUNTER — Ambulatory Visit (HOSPITAL_COMMUNITY)
Admission: RE | Admit: 2014-09-25 | Discharge: 2014-09-25 | Disposition: A | Discharge status: Home / Self Care | Payer: Medicare Other | Source: Ambulatory Visit | Attending: Family Medicine | Admitting: Family Medicine

## 2014-09-25 DIAGNOSIS — Z1231 Encounter for screening mammogram for malignant neoplasm of breast: Secondary | ICD-10-CM | POA: Insufficient documentation

## 2014-10-06 ENCOUNTER — Ambulatory Visit (INDEPENDENT_AMBULATORY_CARE_PROVIDER_SITE_OTHER): Payer: Medicare Other | Admitting: Family Medicine

## 2014-10-06 ENCOUNTER — Encounter: Payer: Self-pay | Admitting: Family Medicine

## 2014-10-06 VITALS — BP 132/78 | Ht 64.0 in | Wt 135.1 lb

## 2014-10-06 DIAGNOSIS — M8580 Other specified disorders of bone density and structure, unspecified site: Secondary | ICD-10-CM | POA: Diagnosis not present

## 2014-10-06 DIAGNOSIS — D509 Iron deficiency anemia, unspecified: Secondary | ICD-10-CM | POA: Diagnosis not present

## 2014-10-06 DIAGNOSIS — Z13220 Encounter for screening for lipoid disorders: Secondary | ICD-10-CM

## 2014-10-06 DIAGNOSIS — M810 Age-related osteoporosis without current pathological fracture: Secondary | ICD-10-CM

## 2014-10-06 DIAGNOSIS — D649 Anemia, unspecified: Secondary | ICD-10-CM | POA: Diagnosis not present

## 2014-10-06 DIAGNOSIS — Z79899 Other long term (current) drug therapy: Secondary | ICD-10-CM | POA: Diagnosis not present

## 2014-10-06 DIAGNOSIS — G4700 Insomnia, unspecified: Secondary | ICD-10-CM | POA: Diagnosis not present

## 2014-10-06 DIAGNOSIS — E559 Vitamin D deficiency, unspecified: Secondary | ICD-10-CM

## 2014-10-06 MED ORDER — ZOLPIDEM TARTRATE 10 MG PO TABS: 10.0000 mg | ORAL_TABLET | Freq: Every evening | ORAL | Status: DC | PRN

## 2014-10-06 MED ORDER — ALENDRONATE SODIUM 70 MG PO TABS: ORAL_TABLET | ORAL | Status: DC

## 2014-10-06 MED ORDER — PANTOPRAZOLE SODIUM 40 MG PO TBEC: 40.0000 mg | DELAYED_RELEASE_TABLET | Freq: Every day | ORAL | Status: DC

## 2014-10-06 NOTE — Progress Notes (Signed)
   Subjective:    Patient ID: Jeanne Rogers, female    DOB: Oct 05, 1940, 74 y.o.   MRN: [id number]  HPI Patient is here today for a follow up visit on osteoporosis/anemia. Patient compliant with Fosamax. Does not miss a dose. Vitamin D was low a year ago.  History of anemia. See prior note. Blood work obtained in September. Due to recheck again at this time.  Patient is doing very well.   Patient states that she has some pain in her toes on the right foot and wants to discuss being referred to a specialist for this.   Wears a bracd on the left ankle,   Feels like may have strained it with a fall See prior note  Review of Systems No headache no chest pain no back pain no change in bowel habits no blood in stool    Objective:   Physical Exam  Alert no acute distress H&T normal. Lungs clear. Heart regular in rhythm. Left ankle some pain with flexion and extension pulses sensation good right lateral foot sensation currently intact no focal pain at this time.      Assessment & Plan:  Impression 1 hyperlipidemia status uncertain #2 low vitamin D status uncertain #3 osteoporosis discussed #4 nonspecific foot pain continue symptomatic care persists patient to call podiatrist on her own #5 insomnia discussed #6 anemia status uncertain plan appropriate blood work. Medications refilled. Check in 6 months. Diet exercise discussed. WSL

## 2014-10-18 DIAGNOSIS — M8580 Other specified disorders of bone density and structure, unspecified site: Secondary | ICD-10-CM | POA: Diagnosis not present

## 2014-10-18 DIAGNOSIS — D649 Anemia, unspecified: Secondary | ICD-10-CM | POA: Diagnosis not present

## 2014-10-18 DIAGNOSIS — M899 Disorder of bone, unspecified: Secondary | ICD-10-CM | POA: Diagnosis not present

## 2014-10-18 DIAGNOSIS — Z79899 Other long term (current) drug therapy: Secondary | ICD-10-CM | POA: Diagnosis not present

## 2014-10-18 DIAGNOSIS — Z13220 Encounter for screening for lipoid disorders: Secondary | ICD-10-CM | POA: Diagnosis not present

## 2014-10-19 LAB — LIPID PANEL
Chol/HDL Ratio: 3.5 ratio units (ref 0.0–4.4)
Cholesterol, Total: 248 mg/dL — ABNORMAL HIGH (ref 100–199)
HDL: 71 mg/dL (ref >39)
LDL CALC: 164 mg/dL — AB (ref 0–99)
Triglycerides: 65 mg/dL (ref 0–149)
VLDL Cholesterol Cal: 13 mg/dL (ref 5–40)

## 2014-10-19 LAB — HEPATIC FUNCTION PANEL
ALT: 26 IU/L (ref 0–32)
AST: 29 IU/L (ref 0–40)
Albumin: 4 g/dL (ref 3.5–4.8)
Alkaline Phosphatase: 61 IU/L (ref 39–117)
BILIRUBIN, DIRECT: 0.1 mg/dL (ref 0.00–0.40)
Bilirubin Total: 0.4 mg/dL (ref 0.0–1.2)
Total Protein: 6.2 g/dL (ref 6.0–8.5)

## 2014-10-19 LAB — BASIC METABOLIC PANEL
BUN/Creatinine Ratio: 20 (ref 11–26)
BUN: 17 mg/dL (ref 8–27)
CALCIUM: 9.3 mg/dL (ref 8.7–10.3)
CO2: 26 mmol/L (ref 18–29)
Chloride: 102 mmol/L (ref 97–108)
Creatinine, Ser: 0.84 mg/dL (ref 0.57–1.00)
GFR, EST AFRICAN AMERICAN: 80 mL/min/{1.73_m2} (ref >59)
GFR, EST NON AFRICAN AMERICAN: 70 mL/min/{1.73_m2} (ref >59)
Glucose: 90 mg/dL (ref 65–99)
POTASSIUM: 4.2 mmol/L (ref 3.5–5.2)
SODIUM: 144 mmol/L (ref 134–144)

## 2014-10-19 LAB — CBC WITH DIFFERENTIAL/PLATELET
Basophils Absolute: 0.1 10*3/uL (ref 0.0–0.2)
Basos: 1 %
Eos: 4 %
Eosinophils Absolute: 0.2 10*3/uL (ref 0.0–0.4)
HCT: 42.1 % (ref 34.0–46.6)
Hemoglobin: 13.6 g/dL (ref 11.1–15.9)
Immature Grans (Abs): 0 10*3/uL (ref 0.0–0.1)
Immature Granulocytes: 0 %
LYMPHS: 24 %
Lymphocytes Absolute: 1.6 10*3/uL (ref 0.7–3.1)
MCH: 29.7 pg (ref 26.6–33.0)
MCHC: 32.3 g/dL (ref 31.5–35.7)
MCV: 92 fL (ref 79–97)
MONOS ABS: 0.4 10*3/uL (ref 0.1–0.9)
Monocytes: 5 %
Neutrophils Absolute: 4.6 10*3/uL (ref 1.4–7.0)
Neutrophils Relative %: 66 %
Platelets: 314 10*3/uL (ref 150–379)
RBC: 4.58 x10E6/uL (ref 3.77–5.28)
RDW: 14.4 % (ref 12.3–15.4)
WBC: 6.9 10*3/uL (ref 3.4–10.8)

## 2014-10-19 LAB — VITAMIN D 25 HYDROXY (VIT D DEFICIENCY, FRACTURES): Vit D, 25-Hydroxy: 73.5 ng/mL (ref 30.0–100.0)

## 2014-10-19 LAB — FERRITIN: FERRITIN: 93 ng/mL (ref 15–150)

## 2014-10-20 ENCOUNTER — Telehealth: Payer: Self-pay | Admitting: Family Medicine

## 2014-10-20 NOTE — Telephone Encounter (Signed)
Calling to get results to lab results.

## 2014-10-20 NOTE — Telephone Encounter (Signed)
Discussed with pt that Dr. Richardson Landry will get message on Monday for bloodwork resutls.

## 2014-10-23 ENCOUNTER — Encounter: Payer: Self-pay | Admitting: Family Medicine

## 2014-10-23 NOTE — Telephone Encounter (Signed)
plz let pt knwo in future always give [place] one week and she will get a detailed letter with all these results. No anemia, liver kid and glucose perfect. Vit d very good, ldl is up, but mitigated by great hdl, cut down fats in diet if possible

## 2014-10-23 NOTE — Telephone Encounter (Signed)
Notified patient in future always give [place] one week and she will get a detailed letter with all these results. No anemia, liver kidney and glucose perfect. Vit d very good, ldl is up, but mitigated by great hdl, cut down fats in diet if possible. Patient verbalized understanding.

## 2015-04-10 ENCOUNTER — Ambulatory Visit (INDEPENDENT_AMBULATORY_CARE_PROVIDER_SITE_OTHER): Payer: Medicare Other | Admitting: Family Medicine

## 2015-04-10 ENCOUNTER — Encounter: Payer: Self-pay | Admitting: Family Medicine

## 2015-04-10 VITALS — BP 118/74 | Ht 63.0 in | Wt 131.5 lb

## 2015-04-10 DIAGNOSIS — Z0000 Encounter for general adult medical examination without abnormal findings: Secondary | ICD-10-CM | POA: Diagnosis not present

## 2015-04-10 DIAGNOSIS — Z23 Encounter for immunization: Secondary | ICD-10-CM | POA: Diagnosis not present

## 2015-04-10 MED ORDER — TRAMADOL HCL 50 MG PO TABS: ORAL_TABLET | ORAL | Status: DC

## 2015-04-10 MED ORDER — ZOLPIDEM TARTRATE 10 MG PO TABS: 10.0000 mg | ORAL_TABLET | Freq: Every evening | ORAL | Status: DC | PRN

## 2015-04-10 NOTE — Progress Notes (Signed)
   Subjective:    Patient ID: Jeanne Rogers, female    DOB: 1941/03/05, 74 y.o.   MRN: [id number]  HPI AWV- Annual Wellness Visit  The patient was seen for their annual wellness visit. The patient's past medical history, surgical history, and family history were reviewed. Pertinent vaccines were reviewed ( tetanus, pneumonia, shingles, flu) The patient's medication list was reviewed and updated.  The height and weight were entered. The patient's current BMI is: 23.29  Cognitive screening was completed. Outcome of Mini - Cog: passed  Falls within the past 6 months: none  Current tobacco usage: non-smoker (All patients who use tobacco were given written and verbal information on quitting)  Recent listing of emergency department/hospitalizations over the past year were reviewed.  current specialist the patient sees on a regular basis: none   Medicare annual wellness visit patient questionnaire was reviewed.  A written screening schedule for the patient for the next 5-10 years was given. Appropriate discussion of followup regarding next visit was discussed.  Colonoscopy- May 2015 done by Dr. Laural Golden (no abnormal findings per patient)   Patient has no concerns at this time.   Patient states that she goes to [facility] for her pap smears and she has had a hysterectomy and was told she does not have to have them anymore by Dr. Elonda Husky.   Exercise only so so, needs to improve  ambien uses intermittently and it helps  Tramadol prn for back needs refill  utd on mammo gram s  Needs flu shot now   Review of Systems  Constitutional: Negative for activity change, appetite change and fatigue.  HENT: Negative for congestion, ear discharge and rhinorrhea.   Eyes: Negative for discharge.  Respiratory: Negative for cough, chest tightness and wheezing.   Cardiovascular: Negative for chest pain.  Gastrointestinal: Negative for vomiting and abdominal pain.  Genitourinary: Negative for  frequency and difficulty urinating.  Musculoskeletal: Negative for neck pain.  Allergic/Immunologic: Negative for environmental allergies and food allergies.  Neurological: Negative for weakness and headaches.  Psychiatric/Behavioral: Negative for behavioral problems and agitation.  All other systems reviewed and are negative.      Objective:   Physical Exam  Constitutional: She is oriented to person, place, and time. She appears well-developed and well-nourished.  HENT:  Head: Normocephalic.  Right Ear: External ear normal.  Left Ear: External ear normal.  Eyes: Pupils are equal, round, and reactive to light.  Neck: Normal range of motion. No thyromegaly present.  Cardiovascular: Normal rate, regular rhythm, normal heart sounds and intact distal pulses.   No murmur heard. Pulmonary/Chest: Effort normal and breath sounds normal. No respiratory distress. She has no wheezes.  Abdominal: Soft. Bowel sounds are normal. She exhibits no distension and no mass. There is no tenderness.  Genitourinary: Vagina normal and uterus normal.  Breast without masses  Musculoskeletal: Normal range of motion. She exhibits no edema or tenderness.  Lymphadenopathy:    She has no cervical adenopathy.  Neurological: She is alert and oriented to person, place, and time. She exhibits normal muscle tone.  Skin: Skin is warm and dry.  Psychiatric: She has a normal mood and affect. Her behavior is normal.  Vitals reviewed.         Assessment & Plan:  Impression 1 well adult exam plan flu vaccine given. Medications refilled. Diet exercise discussed. Old TRAM and Ambien refilled at patient request. Up-to-date on vaccines, mammogram and colonoscopy WSL

## 2015-09-28 ENCOUNTER — Other Ambulatory Visit: Payer: Self-pay | Admitting: Family Medicine

## 2015-10-15 ENCOUNTER — Encounter: Payer: Self-pay | Admitting: *Deleted

## 2015-11-14 ENCOUNTER — Other Ambulatory Visit: Payer: Self-pay | Admitting: Family Medicine

## 2015-11-14 DIAGNOSIS — Z1231 Encounter for screening mammogram for malignant neoplasm of breast: Secondary | ICD-10-CM

## 2015-11-24 ENCOUNTER — Other Ambulatory Visit: Payer: Self-pay | Admitting: Family Medicine

## 2015-12-05 ENCOUNTER — Ambulatory Visit (HOSPITAL_COMMUNITY)
Admission: RE | Admit: 2015-12-05 | Discharge: 2015-12-05 | Disposition: A | Discharge status: Home / Self Care | Payer: Medicare Other | Source: Ambulatory Visit | Attending: Family Medicine | Admitting: Family Medicine

## 2015-12-05 DIAGNOSIS — Z1231 Encounter for screening mammogram for malignant neoplasm of breast: Secondary | ICD-10-CM

## 2015-12-25 ENCOUNTER — Other Ambulatory Visit: Payer: Self-pay | Admitting: Family Medicine

## 2016-01-15 ENCOUNTER — Ambulatory Visit (INDEPENDENT_AMBULATORY_CARE_PROVIDER_SITE_OTHER): Payer: Medicare Other | Admitting: Family Medicine

## 2016-01-15 ENCOUNTER — Encounter: Payer: Self-pay | Admitting: Family Medicine

## 2016-01-15 VITALS — BP 130/78 | Ht 63.0 in | Wt 131.0 lb

## 2016-01-15 DIAGNOSIS — L729 Follicular cyst of the skin and subcutaneous tissue, unspecified: Secondary | ICD-10-CM | POA: Diagnosis not present

## 2016-01-15 DIAGNOSIS — M810 Age-related osteoporosis without current pathological fracture: Secondary | ICD-10-CM | POA: Diagnosis not present

## 2016-01-15 DIAGNOSIS — M67449 Ganglion, unspecified hand: Secondary | ICD-10-CM

## 2016-01-15 DIAGNOSIS — G4700 Insomnia, unspecified: Secondary | ICD-10-CM | POA: Diagnosis not present

## 2016-01-15 DIAGNOSIS — Z780 Asymptomatic menopausal state: Secondary | ICD-10-CM

## 2016-01-15 MED ORDER — TRAMADOL HCL 50 MG PO TABS: ORAL_TABLET | ORAL | Status: DC

## 2016-01-15 MED ORDER — ZOLPIDEM TARTRATE 10 MG PO TABS: 10.0000 mg | ORAL_TABLET | Freq: Every evening | ORAL | Status: DC | PRN

## 2016-01-15 NOTE — Progress Notes (Signed)
   Subjective:    Patient ID: Jeanne Rogers, female    DOB: 12-24-1940, 75 y.o.   MRN: [id number]  HPI Patient is here today for a follow up visit on osteoporosis/ anemia. Patient currently taking Fosamax with moderate relief.   bone density test  Due  Patient has cysts that have developed on her fingers at the joints. She would like a referral for this issue. Onset 6 months ago.  Needs hand surgeon  Patient having ongoing difficulty sleeping. States definitely needs Ambien intermittently. Definitely helped. Requests multiple refills Review of Systems No headache, no major weight loss or weight gain, no chest pain no back pain abdominal pain no change in bowel habits complete ROS otherwise negative     Objective:   Physical Exam Alert vitals stable HEENT normal lungs clear heart regular in rhythm benign appearing cyst on finger bothersome clearly to patient ankles without edema       Assessment & Plan:  Impression osteoporosis equivalent last bone density to that years ago discussed #2 insomnia considerable ongoing need for meds discussed #3 finger cyst very worrisome the patient plan orthopedic referral. Bone density test. All medicines refilled. Diet exercise discussed recheck in 6 months WSL

## 2016-01-22 DIAGNOSIS — R2231 Localized swelling, mass and lump, right upper limb: Secondary | ICD-10-CM | POA: Diagnosis not present

## 2016-01-23 ENCOUNTER — Ambulatory Visit (HOSPITAL_COMMUNITY)
Admission: RE | Admit: 2016-01-23 | Discharge: 2016-01-23 | Disposition: A | Discharge status: Home / Self Care | Payer: Medicare Other | Source: Ambulatory Visit | Attending: Family Medicine | Admitting: Family Medicine

## 2016-01-23 DIAGNOSIS — M810 Age-related osteoporosis without current pathological fracture: Secondary | ICD-10-CM | POA: Diagnosis present

## 2016-01-23 DIAGNOSIS — Z780 Asymptomatic menopausal state: Secondary | ICD-10-CM | POA: Diagnosis not present

## 2016-01-25 ENCOUNTER — Other Ambulatory Visit: Payer: Self-pay | Admitting: Orthopedic Surgery

## 2016-01-30 ENCOUNTER — Encounter (HOSPITAL_BASED_OUTPATIENT_CLINIC_OR_DEPARTMENT_OTHER): Payer: Self-pay | Admitting: *Deleted

## 2016-02-07 ENCOUNTER — Ambulatory Visit (HOSPITAL_BASED_OUTPATIENT_CLINIC_OR_DEPARTMENT_OTHER): Payer: Medicare Other | Admitting: Anesthesiology

## 2016-02-07 ENCOUNTER — Ambulatory Visit (HOSPITAL_BASED_OUTPATIENT_CLINIC_OR_DEPARTMENT_OTHER)
Admission: RE | Admit: 2016-02-07 | Discharge: 2016-02-07 | Disposition: A | Discharge status: Home / Self Care | Payer: Medicare Other | Source: Ambulatory Visit | Attending: Orthopedic Surgery | Admitting: Orthopedic Surgery

## 2016-02-07 ENCOUNTER — Encounter (HOSPITAL_BASED_OUTPATIENT_CLINIC_OR_DEPARTMENT_OTHER)
Admission: RE | Disposition: A | Discharge status: Home / Self Care | Payer: Self-pay | Source: Ambulatory Visit | Attending: Orthopedic Surgery

## 2016-02-07 ENCOUNTER — Encounter (HOSPITAL_BASED_OUTPATIENT_CLINIC_OR_DEPARTMENT_OTHER): Payer: Self-pay | Admitting: Anesthesiology

## 2016-02-07 DIAGNOSIS — K219 Gastro-esophageal reflux disease without esophagitis: Secondary | ICD-10-CM | POA: Diagnosis not present

## 2016-02-07 DIAGNOSIS — Z79899 Other long term (current) drug therapy: Secondary | ICD-10-CM | POA: Insufficient documentation

## 2016-02-07 DIAGNOSIS — M810 Age-related osteoporosis without current pathological fracture: Secondary | ICD-10-CM | POA: Diagnosis not present

## 2016-02-07 DIAGNOSIS — Z87891 Personal history of nicotine dependence: Secondary | ICD-10-CM | POA: Diagnosis not present

## 2016-02-07 DIAGNOSIS — D2111 Benign neoplasm of connective and other soft tissue of right upper limb, including shoulder: Secondary | ICD-10-CM | POA: Diagnosis not present

## 2016-02-07 DIAGNOSIS — L720 Epidermal cyst: Secondary | ICD-10-CM | POA: Insufficient documentation

## 2016-02-07 DIAGNOSIS — R2231 Localized swelling, mass and lump, right upper limb: Secondary | ICD-10-CM | POA: Insufficient documentation

## 2016-02-07 DIAGNOSIS — Z7983 Long term (current) use of bisphosphonates: Secondary | ICD-10-CM | POA: Diagnosis not present

## 2016-02-07 HISTORY — PX: MASS EXCISION: SHX2000

## 2016-02-07 SURGERY — EXCISION MASS
Anesthesia: Monitor Anesthesia Care | Site: Finger | Laterality: Right

## 2016-02-07 MED ORDER — FENTANYL CITRATE (PF) 100 MCG/2ML IJ SOLN
INTRAMUSCULAR | Status: DC | PRN
  Administered 2016-02-07: 100 ug via INTRAVENOUS

## 2016-02-07 MED ORDER — CEFAZOLIN SODIUM-DEXTROSE 2-4 GM/100ML-% IV SOLN
2.0000 g | INTRAVENOUS | Status: AC
  Administered 2016-02-07: 2 g via INTRAVENOUS

## 2016-02-07 MED ORDER — FENTANYL CITRATE (PF) 100 MCG/2ML IJ SOLN
INTRAMUSCULAR | Status: AC
  Filled 2016-02-07: qty 2

## 2016-02-07 MED ORDER — MIDAZOLAM HCL 2 MG/2ML IJ SOLN: 1.0000 mg | INTRAMUSCULAR | Status: DC | PRN

## 2016-02-07 MED ORDER — MIDAZOLAM HCL 2 MG/2ML IJ SOLN
INTRAMUSCULAR | Status: AC
  Filled 2016-02-07: qty 2

## 2016-02-07 MED ORDER — ONDANSETRON HCL 4 MG/2ML IJ SOLN
INTRAMUSCULAR | Status: DC | PRN
  Administered 2016-02-07: 4 mg via INTRAVENOUS

## 2016-02-07 MED ORDER — MIDAZOLAM HCL 5 MG/5ML IJ SOLN
INTRAMUSCULAR | Status: DC | PRN
Start: 2016-02-07 — End: 2016-02-07
  Administered 2016-02-07: 2 mg via INTRAVENOUS

## 2016-02-07 MED ORDER — DEXAMETHASONE SODIUM PHOSPHATE 10 MG/ML IJ SOLN
INTRAMUSCULAR | Status: DC | PRN
  Administered 2016-02-07: 10 mg via INTRAVENOUS

## 2016-02-07 MED ORDER — LACTATED RINGERS IV SOLN
INTRAVENOUS | Status: DC
  Administered 2016-02-07: 10 mL/h via INTRAVENOUS

## 2016-02-07 MED ORDER — OXYCODONE HCL 5 MG PO TABS: 5.0000 mg | ORAL_TABLET | Freq: Once | ORAL | Status: DC | PRN

## 2016-02-07 MED ORDER — CHLORHEXIDINE GLUCONATE 4 % EX LIQD: 60.0000 mL | Freq: Once | CUTANEOUS | Status: DC

## 2016-02-07 MED ORDER — FENTANYL CITRATE (PF) 100 MCG/2ML IJ SOLN: 50.0000 ug | INTRAMUSCULAR | Status: DC | PRN

## 2016-02-07 MED ORDER — BUPIVACAINE HCL (PF) 0.25 % IJ SOLN
INTRAMUSCULAR | Status: DC | PRN
  Administered 2016-02-07: 8 mL

## 2016-02-07 MED ORDER — SCOPOLAMINE 1 MG/3DAYS TD PT72: 1.0000 | MEDICATED_PATCH | Freq: Once | TRANSDERMAL | Status: DC | PRN

## 2016-02-07 MED ORDER — PROPOFOL 10 MG/ML IV BOLUS
INTRAVENOUS | Status: DC | PRN
  Administered 2016-02-07: 100 mg via INTRAVENOUS

## 2016-02-07 MED ORDER — LIDOCAINE HCL (CARDIAC) 20 MG/ML IV SOLN
INTRAVENOUS | Status: DC | PRN
  Administered 2016-02-07: 50 mg via INTRAVENOUS

## 2016-02-07 MED ORDER — HYDROCODONE-ACETAMINOPHEN 5-325 MG PO TABS: ORAL_TABLET | ORAL | Status: DC

## 2016-02-07 MED ORDER — GLYCOPYRROLATE 0.2 MG/ML IJ SOLN: 0.2000 mg | Freq: Once | INTRAMUSCULAR | Status: DC | PRN

## 2016-02-07 MED ORDER — PROMETHAZINE HCL 25 MG/ML IJ SOLN
6.2500 mg | INTRAMUSCULAR | Status: DC | PRN
  Administered 2016-02-07: 6.25 mg via INTRAVENOUS

## 2016-02-07 MED ORDER — CEFAZOLIN SODIUM-DEXTROSE 2-4 GM/100ML-% IV SOLN
INTRAVENOUS | Status: AC
  Filled 2016-02-07: qty 100

## 2016-02-07 MED ORDER — FENTANYL CITRATE (PF) 100 MCG/2ML IJ SOLN: 25.0000 ug | INTRAMUSCULAR | Status: DC | PRN

## 2016-02-07 MED ORDER — OXYCODONE HCL 5 MG/5ML PO SOLN: 5.0000 mg | Freq: Once | ORAL | Status: DC | PRN

## 2016-02-07 MED ORDER — PROMETHAZINE HCL 25 MG/ML IJ SOLN
INTRAMUSCULAR | Status: AC
  Filled 2016-02-07: qty 1

## 2016-02-07 SURGICAL SUPPLY — 57 items
APL SKNCLS STERI-STRIP NONHPOA (GAUZE/BANDAGES/DRESSINGS)
BANDAGE ACE 3X5.8 VEL STRL LF (GAUZE/BANDAGES/DRESSINGS) IMPLANT
BANDAGE COBAN STERILE 2 (GAUZE/BANDAGES/DRESSINGS) ×2 IMPLANT
BENZOIN TINCTURE PRP APPL 2/3 (GAUZE/BANDAGES/DRESSINGS) IMPLANT
BLADE MINI RND TIP GREEN BEAV (BLADE) IMPLANT
BLADE SURG 15 STRL LF DISP TIS (BLADE) ×2 IMPLANT
BLADE SURG 15 STRL SS (BLADE) ×6
BNDG CMPR 9X4 STRL LF SNTH (GAUZE/BANDAGES/DRESSINGS) ×1
BNDG COHESIVE 1X5 TAN STRL LF (GAUZE/BANDAGES/DRESSINGS) ×2 IMPLANT
BNDG CONFORM 2 STRL LF (GAUZE/BANDAGES/DRESSINGS) IMPLANT
BNDG ELASTIC 2X5.8 VLCR STR LF (GAUZE/BANDAGES/DRESSINGS) IMPLANT
BNDG ESMARK 4X9 LF (GAUZE/BANDAGES/DRESSINGS) ×2 IMPLANT
BNDG GAUZE 1X2.1 STRL (MISCELLANEOUS) IMPLANT
BNDG GAUZE ELAST 4 BULKY (GAUZE/BANDAGES/DRESSINGS) IMPLANT
BNDG PLASTER X FAST 3X3 WHT LF (CAST SUPPLIES) IMPLANT
BNDG PLSTR 9X3 FST ST WHT (CAST SUPPLIES)
CHLORAPREP W/TINT 26ML (MISCELLANEOUS) ×3 IMPLANT
CLOSURE WOUND 1/2 X4 (GAUZE/BANDAGES/DRESSINGS)
CORDS BIPOLAR (ELECTRODE) ×3 IMPLANT
COVER BACK TABLE 60X90IN (DRAPES) ×3 IMPLANT
COVER MAYO STAND STRL (DRAPES) ×3 IMPLANT
CUFF TOURNIQUET SINGLE 18IN (TOURNIQUET CUFF) ×3 IMPLANT
DRAPE EXTREMITY T 121X128X90 (DRAPE) ×3 IMPLANT
DRAPE SURG 17X23 STRL (DRAPES) ×3 IMPLANT
GAUZE SPONGE 4X4 12PLY STRL (GAUZE/BANDAGES/DRESSINGS) ×3 IMPLANT
GAUZE XEROFORM 1X8 LF (GAUZE/BANDAGES/DRESSINGS) ×3 IMPLANT
GLOVE BIO SURGEON STRL SZ 6.5 (GLOVE) ×1 IMPLANT
GLOVE BIO SURGEON STRL SZ7.5 (GLOVE) ×3 IMPLANT
GLOVE BIO SURGEONS STRL SZ 6.5 (GLOVE) ×1
GLOVE BIOGEL PI IND STRL 7.0 (GLOVE) IMPLANT
GLOVE BIOGEL PI IND STRL 8 (GLOVE) ×1 IMPLANT
GLOVE BIOGEL PI INDICATOR 7.0 (GLOVE) ×4
GLOVE BIOGEL PI INDICATOR 8 (GLOVE) ×2
GOWN STRL REUS W/ TWL LRG LVL3 (GOWN DISPOSABLE) ×1 IMPLANT
GOWN STRL REUS W/TWL LRG LVL3 (GOWN DISPOSABLE) ×3
GOWN STRL REUS W/TWL XL LVL3 (GOWN DISPOSABLE) ×3 IMPLANT
NDL HYPO 25X1 1.5 SAFETY (NEEDLE) ×1 IMPLANT
NEEDLE HYPO 25X1 1.5 SAFETY (NEEDLE) ×3 IMPLANT
NS IRRIG 1000ML POUR BTL (IV SOLUTION) ×3 IMPLANT
PACK BASIN DAY SURGERY FS (CUSTOM PROCEDURE TRAY) ×3 IMPLANT
PAD CAST 3X4 CTTN HI CHSV (CAST SUPPLIES) IMPLANT
PAD CAST 4YDX4 CTTN HI CHSV (CAST SUPPLIES) IMPLANT
PADDING CAST ABS 4INX4YD NS (CAST SUPPLIES) ×2
PADDING CAST ABS COTTON 4X4 ST (CAST SUPPLIES) ×1 IMPLANT
PADDING CAST COTTON 3X4 STRL (CAST SUPPLIES)
PADDING CAST COTTON 4X4 STRL (CAST SUPPLIES)
STOCKINETTE 4X48 STRL (DRAPES) ×3 IMPLANT
STRIP CLOSURE SKIN 1/2X4 (GAUZE/BANDAGES/DRESSINGS) IMPLANT
SUT ETHILON 3 0 PS 1 (SUTURE) IMPLANT
SUT ETHILON 4 0 PS 2 18 (SUTURE) ×3 IMPLANT
SUT ETHILON 5 0 P 3 18 (SUTURE)
SUT NYLON ETHILON 5-0 P-3 1X18 (SUTURE) IMPLANT
SUT VIC AB 4-0 P2 18 (SUTURE) IMPLANT
SYR BULB 3OZ (MISCELLANEOUS) ×3 IMPLANT
SYR CONTROL 10ML LL (SYRINGE) ×3 IMPLANT
TOWEL OR 17X24 6PK STRL BLUE (TOWEL DISPOSABLE) ×6 IMPLANT
UNDERPAD 30X30 (UNDERPADS AND DIAPERS) ×3 IMPLANT

## 2016-02-07 NOTE — Anesthesia Postprocedure Evaluation (Signed)
Anesthesia Post Note  Patient: Jeanne Rogers  Procedure(s) Performed: Procedure(s) (LRB): EXCISION MASS RIGHT INDEX FINGER AND RIGHT HAND MASS (Right)  Patient location during evaluation: PACU Anesthesia Type: General Level of consciousness: awake and alert Pain management: pain level controlled Vital Signs Assessment: post-procedure vital signs reviewed and stable Respiratory status: spontaneous breathing, nonlabored ventilation, respiratory function stable and patient connected to nasal cannula oxygen Cardiovascular status: blood pressure returned to baseline and stable Postop Assessment: no signs of nausea or vomiting Anesthetic complications: no    Last Vitals:  Filed Vitals:   02/07/16 0957  BP: 155/58  Pulse: 65  Temp: 36.6 C  Resp: 18    Last Pain: There were no vitals filed for this visit.               Cressman,[name]

## 2016-02-07 NOTE — H&P (Addendum)
Jeanne Rogers is an 75 y.o. female.   Chief Complaint: right index finger mass HPI: 75 yo rhd female with mass in right index finger x several months.  It is bothersome to her.  She wishes to have it removed.  She has also noted a mass at the ulnar border of the hand by the mp joint of the small finger that she wishes to have excised as well.    Allergies:  Allergies  Allergen Reactions  . Methergine [Methylergonovine Maleate] Other (See Comments)    Unknown    Past Medical History  Diagnosis Date  . Osteoporosis   . Hyperlipidemia   . Collapse, lung   . Spontaneous pneumothorax   . Complication of anesthesia     very sensitive to anesthesia    Past Surgical History  Procedure Laterality Date  . Oophorectomy Bilateral 2005  . Hysterectomy    . Colonoscopy N/A 01/04/2014    Procedure: COLONOSCOPY;  Surgeon: Rogene Houston, MD;  Location: [facility];  Service: Endoscopy;  Laterality: N/A;  200  . Esophagogastroduodenoscopy N/A 01/04/2014    Procedure: ESOPHAGOGASTRODUODENOSCOPY (EGD);  Surgeon: Rogene Houston, MD;  Location: [facility];  Service: Endoscopy;  Laterality: N/A;    Family History: Family History  Problem Relation Age of Onset  . Hyperlipidemia Mother   . Hypertension Father     Social History:   reports that she has quit smoking. She quit smokeless tobacco use about 27 years ago. She reports that she does not drink alcohol or use illicit drugs.  Medications: Medications Prior to Admission  Medication Sig Dispense Refill  . alendronate (FOSAMAX) 70 MG tablet TAKE 1 TABLET BY MOUTH EVERY WEEK 12 tablet 0  . cholecalciferol (VITAMIN D) 1000 units tablet Take 5,000 Units by mouth daily.    . Multiple Vitamin (MULTIVITAMIN) tablet Take 1 tablet by mouth daily.    . pantoprazole (PROTONIX) 40 MG tablet TAKE 1 TABLET BY MOUTH DAILY 90 tablet 0  . traMADol (ULTRAM) 50 MG tablet TAKE ONE-HALF TO ONE TABLET BY MOUTH TWICE DAILY AS NEEDED 40 tablet 1  .  zolpidem (AMBIEN) 10 MG tablet Take 1 tablet (10 mg total) by mouth at bedtime as needed for sleep. 30 tablet 2  . clotrimazole-betamethasone (LOTRISONE) cream APPLY TOPICALLY TO THE AFFECTED AREA TWICE DAILY 30 g 0    No results found for this or any previous visit (from the past 45 hour(s)).  No results found.   A comprehensive review of systems was negative.  Blood pressure 155/58, pulse 65, temperature 97.8 F (36.6 C), temperature source Oral, resp. rate 18, height 5\' 3"  (1.6 m), weight 58.514 kg (129 lb), SpO2 100 %.  General appearance: alert Head: Normocephalic, without obvious abnormality, atraumatic Neck: supple, symmetrical, trachea midline Resp: clear to auscultation bilaterally Cardio: regular rate and rhythm GI: non-tender Extremities: Intact sensation and capillary refill all digits.  +epl/fpl/io.  No wounds. Soft tissue mass at ulnar side of mp joint of small finger.  This is firm and mobile under the skin.  No skin changes. Pulses: 2+ and symmetric Skin: Skin color, texture, turgor normal. No rashes or lesions Neurologic: Grossly normal Incision/Wound:none  Assessment/Plan Right index finger mass and palm mass.  Non operative and operative treatment options were discussed with the patient and patient wishes to proceed with operative treatment. Risks, benefits, and alternatives of surgery were discussed and the patient agrees with the plan of care.   Iori Gigante R 02/07/2016, 11:19  AM

## 2016-02-07 NOTE — Anesthesia Procedure Notes (Signed)
Procedure Name: LMA Insertion Date/Time: 02/07/2016 12:28 PM Performed by: Toula Moos L Pre-anesthesia Checklist: Patient identified, Emergency Drugs available, Suction available, Patient being monitored and Timeout performed Patient Re-evaluated:Patient Re-evaluated prior to inductionOxygen Delivery Method: Circle system utilized Preoxygenation: Pre-oxygenation with 100% oxygen Intubation Type: IV induction Ventilation: Mask ventilation without difficulty LMA: LMA inserted LMA Size: 4.0 Number of attempts: 1 Airway Equipment and Method: Bite block Placement Confirmation: positive ETCO2 Tube secured with: Tape Dental Injury: Teeth and Oropharynx as per pre-operative assessment

## 2016-02-07 NOTE — Discharge Instructions (Addendum)

## 2016-02-07 NOTE — Transfer of Care (Signed)
Immediate Anesthesia Transfer of Care Note  Patient: Jeanne Rogers  Procedure(s) Performed: Procedure(s): EXCISION MASS RIGHT INDEX FINGER AND RIGHT HAND MASS (Right)  Patient Location: PACU  Anesthesia Type:General  Level of Consciousness: sedated  Airway & Oxygen Therapy: Patient Spontanous Breathing and Patient connected to face mask oxygen  Post-op Assessment: Report given to RN and Post -op Vital signs reviewed and stable  Post vital signs: Reviewed and stable  Last Vitals:  Filed Vitals:   02/07/16 0957  BP: 155/58  Pulse: 65  Temp: 36.6 C  Resp: 18    Last Pain: There were no vitals filed for this visit.       Complications: No apparent anesthesia complications

## 2016-02-07 NOTE — Addendum Note (Signed)
Addendum  created 02/07/16 1408 by Marrianne Mood, CRNA   Modules edited: Anesthesia Flowsheet

## 2016-02-07 NOTE — Brief Op Note (Signed)
02/07/2016  1:00 PM  PATIENT:  Carolynn Sayers  75 y.o. female  PRE-OPERATIVE DIAGNOSIS:  RIGHT INDEX FINGER MASS RIGHT HAND MASS  POST-OPERATIVE DIAGNOSIS:  RIGHT INDEX FINGER AND HAND MASSES  PROCEDURE:  Procedure(s): EXCISION MASS RIGHT INDEX FINGER AND RIGHT HAND MASS (Right)  SURGEON:  Surgeon(s) and Role:    * Leanora Cover, MD - Primary  PHYSICIAN ASSISTANT:   ASSISTANTS: none   ANESTHESIA:   general  EBL:     BLOOD ADMINISTERED:none  DRAINS: none   LOCAL MEDICATIONS USED:  MARCAINE     SPECIMEN:  Source of Specimen:  right index finger, right hand  DISPOSITION OF SPECIMEN:  PATHOLOGY  COUNTS:  YES  TOURNIQUET:   Total Tourniquet Time Documented: Upper Arm (Right) - 22 minutes Total: Upper Arm (Right) - 22 minutes   DICTATION: .Other Dictation: Dictation Number [id number]  PLAN OF CARE: Discharge to home after PACU  PATIENT DISPOSITION:  PACU - hemodynamically stable.

## 2016-02-07 NOTE — Anesthesia Preprocedure Evaluation (Addendum)
Anesthesia Evaluation  Patient identified by MRN, date of birth, ID band Patient awake    Reviewed: Allergy & Precautions, NPO status , Patient's Chart, lab work & pertinent test results  Airway Mallampati: II  TM Distance: >3 FB Neck ROM: Full    Dental no notable dental hx.    Pulmonary neg pulmonary ROS, former smoker,    Pulmonary exam normal breath sounds clear to auscultation       Cardiovascular negative cardio ROS Normal cardiovascular exam Rhythm:Regular Rate:Normal     Neuro/Psych negative neurological ROS  negative psych ROS   GI/Hepatic Neg liver ROS, GERD  ,  Endo/Other  negative endocrine ROS  Renal/GU negative Renal ROS  negative genitourinary   Musculoskeletal negative musculoskeletal ROS (+)   Abdominal   Peds negative pediatric ROS (+)  Hematology negative hematology ROS (+)   Anesthesia Other Findings   Reproductive/Obstetrics negative OB ROS                             Anesthesia Physical Anesthesia Plan  ASA: II  Anesthesia Plan: MAC   Post-op Pain Management:    Induction: Intravenous  Airway Management Planned: Simple Face Mask  Additional Equipment:   Intra-op Plan:   Post-operative Plan:   Informed Consent: I have reviewed the patients History and Physical, chart, labs and discussed the procedure including the risks, benefits and alternatives for the proposed anesthesia with the patient or authorized representative who has indicated his/her understanding and acceptance.   Dental advisory given  Plan Discussed with: CRNA and Surgeon  Anesthesia Plan Comments:         Anesthesia Quick Evaluation

## 2016-02-07 NOTE — Op Note (Signed)
[id number] 

## 2016-02-08 NOTE — Op Note (Signed)
NAME:  Jeanne Rogers, Jeanne Rogers             ACCOUNT NO.:  [id number]  MEDICAL RECORD NO.:  [id number]  LOCATION:                                 FACILITY:  PHYSICIAN:  Leanora Cover, MD        DATE OF BIRTH:  08/28/1940  DATE OF PROCEDURE:  02/07/2016 DATE OF DISCHARGE:                              OPERATIVE REPORT   PREOPERATIVE DIAGNOSIS:  Right index finger mass and right hand mass.  POSTOPERATIVE DIAGNOSES:  Right index finger inclusion cyst.  Right hand fibroma.  PROCEDURES:   1. Right index finger excision of subcutaneous mass, 6 mm in diameter  2. Right palm excision of subfascial mass, 1 cm x 7 mm x 4 mm.  SURGEON:  Leanora Cover, MD  ASSISTANT:  None.  ANESTHESIA:  General.  IV FLUIDS:  Per anesthesia flow sheet.  ESTIMATED BLOOD LOSS:  Minimal.  COMPLICATIONS:  None.  SPECIMENS:  Right index finger mass and right palm mass to Pathology.  TOURNIQUET TIME:  22 minutes.  DISPOSITION:  Stable to PACU.  INDICATIONS:  Jeanne Rogers is a 75 year old female, who has noted a mass in her right index finger for several months.  This was bothersome to her. She wished to have it excised.  On preop, she also noted that a mass was present in the ulnar side of the palm near the MP joint of the small finger.  She wished to have this removed as well.  Risks, benefits and alternatives of the surgery were discussed including the risk of blood loss; infection; damage to nerves, vessels, tendons, ligaments, bone; failure of surgery; need for additional surgery; complications with wound healing; continued pain; recurrence of mass.  She voiced understanding of these risks and elected to proceed.  OPERATIVE COURSE:  After being identified preoperatively by myself, the patient and I agreed upon the procedure and site of procedure.  Surgical site was marked.  The risks, benefits and alternatives of the surgery were reviewed and she wished to proceed.  Surgical consent had been signed.  She was  given IV Ancef as preoperative antibiotic prophylaxis. She was transferred to the operating room and placed on the operating table in supine position with the right upper extremity on an armboard. General anesthesia was induced by anesthesiologist.  Right upper extremity was prepped and draped in normal sterile orthopedic fashion. A surgical pause was performed between the surgeons, anesthesia and operating room staff and all were in agreement as to the patient, procedure and site of procedure.  Tourniquet at the proximal aspect of the extremity was inflated to 250 mmHg after exsanguination of the limb with Esmarch bandage.  Incision was made over the mass on the ulnar side of the hand and carried into subcutaneous tissues by spreading technique.  The mass was identified.  It was fibrous in nature.  The ulnar digital nerve and artery to the small finger were identified and protected.  The mass was deep to the fascia and adjacent to the muscle and tendon of the abductor.  It was carefully removed.  It was sent to Pathology for examination.  The wound was irrigated with sterile saline and closed with 4-0 nylon in  a horizontal mattress fashion.  Attention was turned to the index finger.  An incision was made over the mass and carried into subcutaneous tissues by spreading technique.  The mass was in the subcutaneous tissues.  It was freed up of any soft tissue attachments.  It was filled with a creamy white substance.  It was sent to Pathology for examination.  It measured approximately 6 mm in diameter.  The wound was copiously irrigated with sterile saline and closed with 4-0 nylon in a horizontal mattress fashion.  Digital block was performed to the index finger and the ulnar side of the hand using 8 mL of 0.25% plain Marcaine to aid in postoperative analgesia.  The wounds were dressed with sterile Xeroform, 4x4s, and wrapped with Coban dressing lightly.  Tourniquet was deflated at 22  minutes.  Fingertips were pink with brisk capillary refill after deflation of the tourniquet. Operative drapes were broken down.  The patient was awoken from anesthesia safely.  She was transferred back to stretcher and taken to PACU in stable condition.  I will see her back in the office in 1 week for postoperative followup.  I will give her Norco 5/325, 1-2 p.o. q.6 hours p.r.n. pain, dispensed #20.     Leanora Cover, MD   ______________________________ Leanora Cover, MD    KK/MEDQ  D:  02/07/2016  T:  02/08/2016  Job:  [id number]

## 2016-02-14 ENCOUNTER — Encounter (HOSPITAL_BASED_OUTPATIENT_CLINIC_OR_DEPARTMENT_OTHER): Payer: Self-pay | Admitting: Orthopedic Surgery

## 2016-02-16 ENCOUNTER — Other Ambulatory Visit: Payer: Self-pay | Admitting: Family Medicine

## 2016-03-30 ENCOUNTER — Other Ambulatory Visit: Payer: Self-pay | Admitting: Family Medicine

## 2016-04-29 DIAGNOSIS — Z23 Encounter for immunization: Secondary | ICD-10-CM | POA: Diagnosis not present

## 2016-05-09 ENCOUNTER — Other Ambulatory Visit: Payer: Self-pay | Admitting: Family Medicine

## 2016-06-28 ENCOUNTER — Other Ambulatory Visit: Payer: Self-pay | Admitting: Family Medicine

## 2016-07-17 ENCOUNTER — Telehealth: Payer: Self-pay | Admitting: Family Medicine

## 2016-07-17 ENCOUNTER — Encounter: Payer: Medicare Other | Admitting: Family Medicine

## 2016-07-17 DIAGNOSIS — Z13220 Encounter for screening for lipoid disorders: Secondary | ICD-10-CM

## 2016-07-17 DIAGNOSIS — Z79899 Other long term (current) drug therapy: Secondary | ICD-10-CM

## 2016-07-17 DIAGNOSIS — D649 Anemia, unspecified: Secondary | ICD-10-CM

## 2016-07-17 DIAGNOSIS — Z0000 Encounter for general adult medical examination without abnormal findings: Secondary | ICD-10-CM

## 2016-07-17 NOTE — Telephone Encounter (Signed)
Wanting to know if blood work should be done before appointment on 07/23/16 with Dr. Richardson Landry?

## 2016-07-17 NOTE — Telephone Encounter (Signed)
Lip liv m7 cbc 

## 2016-07-18 NOTE — Telephone Encounter (Signed)
Orders ready. Pt notified on voicemail.  

## 2016-07-21 DIAGNOSIS — Z79899 Other long term (current) drug therapy: Secondary | ICD-10-CM | POA: Diagnosis not present

## 2016-07-21 DIAGNOSIS — Z13220 Encounter for screening for lipoid disorders: Secondary | ICD-10-CM | POA: Diagnosis not present

## 2016-07-21 DIAGNOSIS — D649 Anemia, unspecified: Secondary | ICD-10-CM | POA: Diagnosis not present

## 2016-07-21 DIAGNOSIS — Z0000 Encounter for general adult medical examination without abnormal findings: Secondary | ICD-10-CM | POA: Diagnosis not present

## 2016-07-22 LAB — BASIC METABOLIC PANEL
BUN/Creatinine Ratio: 20 (ref 12–28)
BUN: 19 mg/dL (ref 8–27)
CO2: 26 mmol/L (ref 18–29)
CREATININE: 0.97 mg/dL (ref 0.57–1.00)
Calcium: 9.4 mg/dL (ref 8.7–10.3)
Chloride: 99 mmol/L (ref 96–106)
GFR calc Af Amer: 67 mL/min/{1.73_m2} (ref >59)
GFR calc non Af Amer: 58 mL/min/{1.73_m2} — ABNORMAL LOW (ref >59)
GLUCOSE: 91 mg/dL (ref 65–99)
Potassium: 4.2 mmol/L (ref 3.5–5.2)
Sodium: 142 mmol/L (ref 134–144)

## 2016-07-22 LAB — HEPATIC FUNCTION PANEL
ALT: 16 IU/L (ref 0–32)
AST: 24 IU/L (ref 0–40)
Albumin: 4.3 g/dL (ref 3.5–4.8)
Alkaline Phosphatase: 55 IU/L (ref 39–117)
Bilirubin Total: 0.3 mg/dL (ref 0.0–1.2)
Bilirubin, Direct: 0.11 mg/dL (ref 0.00–0.40)
TOTAL PROTEIN: 6.5 g/dL (ref 6.0–8.5)

## 2016-07-22 LAB — CBC WITH DIFFERENTIAL/PLATELET
Basophils Absolute: 0.1 10*3/uL (ref 0.0–0.2)
Basos: 1 %
EOS (ABSOLUTE): 0.2 10*3/uL (ref 0.0–0.4)
EOS: 3 %
HEMOGLOBIN: 13.8 g/dL (ref 11.1–15.9)
Hematocrit: 43.2 % (ref 34.0–46.6)
IMMATURE GRANULOCYTES: 0 %
Immature Grans (Abs): 0 10*3/uL (ref 0.0–0.1)
Lymphocytes Absolute: 1.7 10*3/uL (ref 0.7–3.1)
Lymphs: 25 %
MCH: 29.6 pg (ref 26.6–33.0)
MCHC: 31.9 g/dL (ref 31.5–35.7)
MCV: 93 fL (ref 79–97)
MONOCYTES: 5 %
Monocytes Absolute: 0.3 10*3/uL (ref 0.1–0.9)
NEUTROS PCT: 66 %
Neutrophils Absolute: 4.6 10*3/uL (ref 1.4–7.0)
Platelets: 318 10*3/uL (ref 150–379)
RBC: 4.67 x10E6/uL (ref 3.77–5.28)
RDW: 14.4 % (ref 12.3–15.4)
WBC: 7 10*3/uL (ref 3.4–10.8)

## 2016-07-22 LAB — LIPID PANEL
CHOL/HDL RATIO: 3.3 ratio (ref 0.0–4.4)
Cholesterol, Total: 251 mg/dL — ABNORMAL HIGH (ref 100–199)
HDL: 77 mg/dL (ref >39)
LDL Calculated: 156 mg/dL — ABNORMAL HIGH (ref 0–99)
Triglycerides: 88 mg/dL (ref 0–149)
VLDL CHOLESTEROL CAL: 18 mg/dL (ref 5–40)

## 2016-07-23 ENCOUNTER — Ambulatory Visit (INDEPENDENT_AMBULATORY_CARE_PROVIDER_SITE_OTHER): Payer: Medicare Other | Admitting: Family Medicine

## 2016-07-23 ENCOUNTER — Encounter: Payer: Self-pay | Admitting: Family Medicine

## 2016-07-23 VITALS — BP 138/76 | Ht 62.0 in | Wt 127.0 lb

## 2016-07-23 DIAGNOSIS — M810 Age-related osteoporosis without current pathological fracture: Secondary | ICD-10-CM | POA: Diagnosis not present

## 2016-07-23 DIAGNOSIS — F5101 Primary insomnia: Secondary | ICD-10-CM

## 2016-07-23 DIAGNOSIS — Z0000 Encounter for general adult medical examination without abnormal findings: Secondary | ICD-10-CM

## 2016-07-23 MED ORDER — PANTOPRAZOLE SODIUM 40 MG PO TBEC: 40.0000 mg | DELAYED_RELEASE_TABLET | Freq: Every day | ORAL | 3 refills | Status: DC

## 2016-07-23 MED ORDER — TRAMADOL HCL 50 MG PO TABS: ORAL_TABLET | ORAL | 0 refills | Status: DC

## 2016-07-23 MED ORDER — ZOLPIDEM TARTRATE 10 MG PO TABS: 10.0000 mg | ORAL_TABLET | Freq: Every evening | ORAL | 5 refills | Status: DC | PRN

## 2016-07-23 NOTE — Progress Notes (Signed)
   Subjective:    Patient ID: Jeanne Rogers, female    DOB: 16-Oct-1940, 75 y.o.   MRN: [id number]  HPI AWV- Annual Wellness Visit  The patient was seen for their annual wellness visit. The patient's past medical history, surgical history, and family history were reviewed. Pertinent vaccines were reviewed ( tetanus, pneumonia, shingles, flu) The patient's medication list was reviewed and updated.  The height and weight were entered. The patient's current BMI is: 23.23  Cognitive screening was completed. Outcome of Mini - Cog: pass  Falls within the past 6 months:none  Current tobacco usage: none (All patients who use tobacco were given written and verbal information on quitting)  Recent listing of emergency department/hospitalizations over the past year were reviewed.  current specialist the patient sees on a regular basis: none   Medicare annual wellness visit patient questionnaire was reviewed.  A written screening schedule for the patient for the next 5-10 years was given. Appropriate discussion of followup regarding next visit was discussed.  Needs refill on ambien, protonix, and tramadol.   Patient compliant with insomnia medication. Generally takes most nights. No obvious morning drowsiness. Definitely helps patient sleep. Without it patient states would not get a good nights rest. Ongoing challenges with reflux. States deftly needs medication help.  On Fosamax for her osteoporosis. Tolerating well. Compliant with it. He understands importance  Review of Systems  Constitutional: Negative for activity change, appetite change and fatigue.  HENT: Negative for congestion, ear discharge and rhinorrhea.   Eyes: Negative for discharge.  Respiratory: Negative for cough, chest tightness and wheezing.   Cardiovascular: Negative for chest pain.  Gastrointestinal: Negative for abdominal pain and vomiting.  Genitourinary: Negative for difficulty urinating and frequency.    Musculoskeletal: Negative for neck pain.  Allergic/Immunologic: Negative for environmental allergies and food allergies.  Neurological: Negative for weakness and headaches.  Psychiatric/Behavioral: Negative for agitation and behavioral problems.  All other systems reviewed and are negative.      Objective:   Physical Exam  Constitutional: She is oriented to person, place, and time. She appears well-developed and well-nourished.  HENT:  Head: Normocephalic.  Right Ear: External ear normal.  Left Ear: External ear normal.  Eyes: Pupils are equal, round, and reactive to light.  Neck: Normal range of motion. No thyromegaly present.  Cardiovascular: Normal rate, regular rhythm, normal heart sounds and intact distal pulses.   No murmur heard. Pulmonary/Chest: Effort normal and breath sounds normal. No respiratory distress. She has no wheezes.  Abdominal: Soft. Bowel sounds are normal. She exhibits no distension and no mass. There is no tenderness.  Genitourinary:  Genitourinary Comments: Breast exam pelvic exam rectal exam all within normal limits  Musculoskeletal: Normal range of motion. She exhibits no edema or tenderness.  Lymphadenopathy:    She has no cervical adenopathy.  Neurological: She is alert and oriented to person, place, and time. She exhibits normal muscle tone.  Skin: Skin is warm and dry.  Psychiatric: She has a normal mood and affect. Her behavior is normal.          Assessment & Plan:  Impression 1 wellness exam #2 reflux clinically stable to maintain same meds #3 osteoporosis discuss at length patient to maintain Fosamax No. 4 insomnia fairly severe with ongoing need for medications discussed plan up-to-date on mammogram and colonoscopy. Blood work reviewed. Medications refilled diet exercise

## 2016-07-31 ENCOUNTER — Other Ambulatory Visit: Payer: Self-pay | Admitting: Family Medicine

## 2017-01-09 ENCOUNTER — Other Ambulatory Visit: Payer: Self-pay | Admitting: Family Medicine

## 2017-02-10 ENCOUNTER — Other Ambulatory Visit: Payer: Self-pay | Admitting: Family Medicine

## 2017-02-10 NOTE — Telephone Encounter (Signed)
Last seen December 2017 for wellness

## 2017-02-10 NOTE — Telephone Encounter (Signed)
Ok six mo worth 

## 2017-04-13 ENCOUNTER — Ambulatory Visit (INDEPENDENT_AMBULATORY_CARE_PROVIDER_SITE_OTHER): Payer: Medicare Other

## 2017-04-13 DIAGNOSIS — Z23 Encounter for immunization: Secondary | ICD-10-CM

## 2017-07-02 ENCOUNTER — Other Ambulatory Visit: Payer: Self-pay | Admitting: Family Medicine

## 2017-07-02 NOTE — Telephone Encounter (Signed)
Last seen 07/23/2016.

## 2017-07-06 ENCOUNTER — Other Ambulatory Visit: Payer: Self-pay

## 2017-07-06 MED ORDER — TRAMADOL HCL 50 MG PO TABS: ORAL_TABLET | ORAL | 2 refills | Status: DC

## 2017-07-06 NOTE — Progress Notes (Signed)
Prescription to be faxed.

## 2017-07-06 NOTE — Progress Notes (Signed)
May refill tramadol x2

## 2017-07-16 ENCOUNTER — Ambulatory Visit (INDEPENDENT_AMBULATORY_CARE_PROVIDER_SITE_OTHER): Payer: Medicare Other | Admitting: Family Medicine

## 2017-07-16 ENCOUNTER — Encounter: Payer: Self-pay | Admitting: Family Medicine

## 2017-07-16 ENCOUNTER — Ambulatory Visit (HOSPITAL_COMMUNITY)
Admission: RE | Admit: 2017-07-16 | Discharge: 2017-07-16 | Disposition: A | Discharge status: Home / Self Care | Payer: Medicare Other | Source: Ambulatory Visit | Attending: Family Medicine | Admitting: Family Medicine

## 2017-07-16 VITALS — BP 142/80 | Ht 62.0 in | Wt 122.0 lb

## 2017-07-16 DIAGNOSIS — M545 Low back pain, unspecified: Secondary | ICD-10-CM

## 2017-07-16 DIAGNOSIS — M818 Other osteoporosis without current pathological fracture: Secondary | ICD-10-CM | POA: Diagnosis not present

## 2017-07-16 DIAGNOSIS — M4854XA Collapsed vertebra, not elsewhere classified, thoracic region, initial encounter for fracture: Secondary | ICD-10-CM | POA: Diagnosis not present

## 2017-07-16 DIAGNOSIS — K8020 Calculus of gallbladder without cholecystitis without obstruction: Secondary | ICD-10-CM | POA: Diagnosis not present

## 2017-07-16 LAB — POCT URINALYSIS DIPSTICK
RBC UA: NEGATIVE
pH, UA: 7 (ref 5.0–8.0)

## 2017-07-16 NOTE — Progress Notes (Signed)
   Subjective:    Patient ID: Jeanne Rogers, female    DOB: 03-09-1941, 76 y.o.   MRN: [id number]  HPI  Patient is here today with complaints of lower back pain.For one week. She has been taking tramadol for it which has helped some.  Patient notes pain is fairly severe.  History is significant for osteoporosis and prior compression fractures thoracic spine.  Patient states this pain reminds her much of that.  Difficulty moving around.  Does not recall any sudden injury.  Pain primarily left lower lumbar region with minimal radiation.  Pain is causing nighttime discomfort also Review of Systems No headache, no major weight loss or weight gain, no chest pain no back pain abdominal pain no change in bowel habits complete ROS otherwise negative     Objective:   Physical Exam  A  Impression low back pain acute left lumbarlert and oriented, vitals reviewed and stable, NAD ENT-TM's and ext canals WNL bilat via otoscopic exam Soft palate, tonsils and post pharynx WNL via oropharyngeal exam Neck-symmetric, no masses; thyroid nonpalpable and nontender Pulmonary-no tachypnea or accessory muscle use; Clear without wheezes via auscultation Card--no abnrml murmurs, rhythm reg and rate WNL Carotid pulses symmetric, without bruits      Assessment & Plan:  Impression low back pain left lumbar acute substantial tenderness.  I am hopeful this is a lumbar strain.  However with history of compression fractures T4 x-ray.  X-ray negative.  Recommend traumatic care.  If persists beyond next week we will consider further testing.  Discussed with patient.  Greater than 50% of this 25 minute face to face visit was spent in counseling and discussion and coordination of care regarding the above diagnosis/diagnosies

## 2017-07-24 ENCOUNTER — Other Ambulatory Visit: Payer: Self-pay | Admitting: Family Medicine

## 2017-08-06 DIAGNOSIS — D045 Carcinoma in situ of skin of trunk: Secondary | ICD-10-CM | POA: Diagnosis not present

## 2017-08-06 DIAGNOSIS — L570 Actinic keratosis: Secondary | ICD-10-CM | POA: Diagnosis not present

## 2017-08-06 DIAGNOSIS — L84 Corns and callosities: Secondary | ICD-10-CM | POA: Diagnosis not present

## 2017-08-06 DIAGNOSIS — X32XXXD Exposure to sunlight, subsequent encounter: Secondary | ICD-10-CM | POA: Diagnosis not present

## 2017-08-06 DIAGNOSIS — D225 Melanocytic nevi of trunk: Secondary | ICD-10-CM | POA: Diagnosis not present

## 2017-08-06 DIAGNOSIS — C4441 Basal cell carcinoma of skin of scalp and neck: Secondary | ICD-10-CM | POA: Diagnosis not present

## 2017-09-17 DIAGNOSIS — L84 Corns and callosities: Secondary | ICD-10-CM | POA: Diagnosis not present

## 2017-09-17 DIAGNOSIS — D045 Carcinoma in situ of skin of trunk: Secondary | ICD-10-CM | POA: Diagnosis not present

## 2017-09-23 ENCOUNTER — Other Ambulatory Visit: Payer: Self-pay | Admitting: Family Medicine

## 2017-09-24 NOTE — Telephone Encounter (Signed)
Ok six mo 

## 2017-10-15 ENCOUNTER — Other Ambulatory Visit: Payer: Self-pay | Admitting: Family Medicine

## 2017-11-09 ENCOUNTER — Ambulatory Visit (HOSPITAL_COMMUNITY)
Admission: RE | Admit: 2017-11-09 | Discharge: 2017-11-09 | Disposition: A | Discharge status: Home / Self Care | Payer: Medicare Other | Source: Ambulatory Visit | Attending: Family Medicine | Admitting: Family Medicine

## 2017-11-09 ENCOUNTER — Encounter: Payer: Self-pay | Admitting: Family Medicine

## 2017-11-09 ENCOUNTER — Other Ambulatory Visit: Payer: Self-pay | Admitting: Family Medicine

## 2017-11-09 ENCOUNTER — Ambulatory Visit (INDEPENDENT_AMBULATORY_CARE_PROVIDER_SITE_OTHER): Payer: Medicare Other | Admitting: Family Medicine

## 2017-11-09 ENCOUNTER — Encounter (HOSPITAL_COMMUNITY): Payer: Self-pay

## 2017-11-09 VITALS — BP 124/78 | Ht 63.0 in | Wt 122.6 lb

## 2017-11-09 DIAGNOSIS — Z0001 Encounter for general adult medical examination with abnormal findings: Secondary | ICD-10-CM | POA: Diagnosis not present

## 2017-11-09 DIAGNOSIS — M818 Other osteoporosis without current pathological fracture: Secondary | ICD-10-CM

## 2017-11-09 DIAGNOSIS — Z1231 Encounter for screening mammogram for malignant neoplasm of breast: Secondary | ICD-10-CM | POA: Insufficient documentation

## 2017-11-09 DIAGNOSIS — F5101 Primary insomnia: Secondary | ICD-10-CM | POA: Diagnosis not present

## 2017-11-09 DIAGNOSIS — Z0000 Encounter for general adult medical examination without abnormal findings: Secondary | ICD-10-CM

## 2017-11-09 DIAGNOSIS — Z13220 Encounter for screening for lipoid disorders: Secondary | ICD-10-CM

## 2017-11-09 DIAGNOSIS — M545 Low back pain: Secondary | ICD-10-CM | POA: Diagnosis not present

## 2017-11-09 DIAGNOSIS — Z23 Encounter for immunization: Secondary | ICD-10-CM

## 2017-11-09 DIAGNOSIS — D500 Iron deficiency anemia secondary to blood loss (chronic): Secondary | ICD-10-CM | POA: Diagnosis not present

## 2017-11-09 MED ORDER — ZOLPIDEM TARTRATE 10 MG PO TABS: ORAL_TABLET | ORAL | 5 refills | Status: DC

## 2017-11-09 MED ORDER — ALENDRONATE SODIUM 70 MG PO TABS: 70.0000 mg | ORAL_TABLET | ORAL | 1 refills | Status: DC

## 2017-11-09 NOTE — Patient Instructions (Signed)

## 2017-11-09 NOTE — Progress Notes (Signed)
Subjective:    Patient ID: Jeanne Rogers, female    DOB: [birth year], 77 y.o.   MRN: [id number]  HPI AWV- Annual Wellness Visit  The patient was seen for their annual wellness visit. The patient's past medical history, surgical history, and family history were reviewed. Pertinent vaccines were reviewed ( tetanus, pneumonia, shingles, flu) The patient's medication list was reviewed and updated.  The height and weight were entered.  BMI recorded in electronic record elsewhere  Cognitive screening was completed. Outcome of Mini - Cog: pass   Falls /depression screening electronically recorded within record elsewhere  Current tobacco usage: none (All patients who use tobacco were given written and verbal information on quitting)  Recent listing of emergency department/hospitalizations over the past year were reviewed.  current specialist the patient sees on a regular basis: none   Medicare annual wellness visit patient questionnaire was reviewed.  A written screening schedule for the patient for the next 5-10 years was given. Appropriate discussion of followup regarding next visit was discussed.    Osteoporosis a of June 2017, handling yh fosamax  Patient compliant with insomnia medication. Generally takes most nights. No obvious morning drowsiness. Definitely helps patient sleep. Without it patient states would not get a good nights rest.  Pt using sleep med most every night,  ,Back pain.  Comes and goes.  Low back at times.  Upper back at times.  Known osteoporosis.  Patient claims compliance with Fosamax.  Pain is not daily.  But it is frustrating.  Patient admits to 0 exercise for her back intermittent   Review of Systems  Constitutional: Negative for activity change, appetite change and fatigue.  HENT: Negative for congestion and rhinorrhea.   Eyes: Negative for discharge.  Respiratory: Negative for cough, chest tightness and wheezing.   Cardiovascular: Negative  for chest pain.  Gastrointestinal: Negative for abdominal pain, blood in stool and vomiting.  Endocrine: Negative for polyphagia.  Genitourinary: Negative for difficulty urinating and frequency.  Musculoskeletal: Negative for neck pain.  Skin: Negative for color change.  Allergic/Immunologic: Negative for environmental allergies and food allergies.  Neurological: Negative for weakness and headaches.  Psychiatric/Behavioral: Negative for agitation and behavioral problems.  All other systems reviewed and are negative.      Objective:   Physical Exam  Constitutional: She is oriented to person, place, and time. She appears well-developed and well-nourished.  HENT:  Head: Normocephalic and atraumatic.  Right Ear: External ear normal.  Left Ear: External ear normal.  Eyes: Right eye exhibits no discharge. Left eye exhibits no discharge.  Neck: Normal range of motion. No tracheal deviation present.  Cardiovascular: Normal rate, regular rhythm, normal heart sounds and intact distal pulses. Exam reveals no gallop.  No murmur heard. Pulmonary/Chest: Effort normal and breath sounds normal. No stridor. No respiratory distress. She has no wheezes. She has no rales.  Abdominal: Soft. Bowel sounds are normal. She exhibits no distension and no mass. There is no tenderness. There is no rebound and no guarding.  Musculoskeletal: Normal range of motion. She exhibits no edema or tenderness.  Lymphadenopathy:    She has no cervical adenopathy.  Neurological: She is alert and oriented to person, place, and time. She exhibits normal muscle tone.  Skin: Skin is warm and dry.  Psychiatric: She has a normal mood and affect. Her behavior is normal.  Vitals reviewed.         Assessment & Plan:  Next colon due  20251 impression wellness exam.  Diet discussed.  Exercise discussed.  Vaccines discussed.  Up-to-date on colonoscopy.  2.  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  No  definite history of compression fracture.  Discussed exercises strongly encouraged.  3.  Osteoporosis.  Importance to maintain Fosamax for now discussed  4.  Insomnia discussed medications refilled proper use discussed..    Prior bone density test reviewed.

## 2017-11-13 DIAGNOSIS — D500 Iron deficiency anemia secondary to blood loss (chronic): Secondary | ICD-10-CM | POA: Diagnosis not present

## 2017-11-13 DIAGNOSIS — Z13220 Encounter for screening for lipoid disorders: Secondary | ICD-10-CM | POA: Diagnosis not present

## 2017-11-13 DIAGNOSIS — Z0000 Encounter for general adult medical examination without abnormal findings: Secondary | ICD-10-CM | POA: Diagnosis not present

## 2017-11-14 LAB — BASIC METABOLIC PANEL
BUN/Creatinine Ratio: 15 (ref 12–28)
BUN: 14 mg/dL (ref 8–27)
CALCIUM: 9.5 mg/dL (ref 8.7–10.3)
CO2: 26 mmol/L (ref 20–29)
Chloride: 104 mmol/L (ref 96–106)
Creatinine, Ser: 0.95 mg/dL (ref 0.57–1.00)
GFR calc Af Amer: 68 mL/min/{1.73_m2} (ref >59)
GFR calc non Af Amer: 59 mL/min/{1.73_m2} — ABNORMAL LOW (ref >59)
GLUCOSE: 89 mg/dL (ref 65–99)
POTASSIUM: 4.5 mmol/L (ref 3.5–5.2)
SODIUM: 142 mmol/L (ref 134–144)

## 2017-11-14 LAB — HEPATIC FUNCTION PANEL
ALK PHOS: 60 IU/L (ref 39–117)
ALT: 13 IU/L (ref 0–32)
AST: 23 IU/L (ref 0–40)
Albumin: 3.9 g/dL (ref 3.5–4.8)
Bilirubin Total: 0.3 mg/dL (ref 0.0–1.2)
Bilirubin, Direct: 0.09 mg/dL (ref 0.00–0.40)
TOTAL PROTEIN: 6.4 g/dL (ref 6.0–8.5)

## 2017-11-14 LAB — LIPID PANEL
CHOLESTEROL TOTAL: 253 mg/dL — AB (ref 100–199)
Chol/HDL Ratio: 3 ratio (ref 0.0–4.4)
HDL: 84 mg/dL (ref >39)
LDL CALC: 157 mg/dL — AB (ref 0–99)
Triglycerides: 60 mg/dL (ref 0–149)
VLDL CHOLESTEROL CAL: 12 mg/dL (ref 5–40)

## 2017-11-14 LAB — CBC WITH DIFFERENTIAL/PLATELET
BASOS ABS: 0.1 10*3/uL (ref 0.0–0.2)
Basos: 1 %
EOS (ABSOLUTE): 0.2 10*3/uL (ref 0.0–0.4)
Eos: 3 %
Hematocrit: 43.1 % (ref 34.0–46.6)
Hemoglobin: 13.9 g/dL (ref 11.1–15.9)
IMMATURE GRANULOCYTES: 0 %
Immature Grans (Abs): 0 10*3/uL (ref 0.0–0.1)
Lymphocytes Absolute: 1.5 10*3/uL (ref 0.7–3.1)
Lymphs: 22 %
MCH: 29.4 pg (ref 26.6–33.0)
MCHC: 32.3 g/dL (ref 31.5–35.7)
MCV: 91 fL (ref 79–97)
MONOS ABS: 0.5 10*3/uL (ref 0.1–0.9)
Monocytes: 7 %
NEUTROS PCT: 67 %
Neutrophils Absolute: 4.6 10*3/uL (ref 1.4–7.0)
PLATELETS: 377 10*3/uL (ref 150–379)
RBC: 4.72 x10E6/uL (ref 3.77–5.28)
RDW: 14.5 % (ref 12.3–15.4)
WBC: 6.9 10*3/uL (ref 3.4–10.8)

## 2017-11-22 ENCOUNTER — Encounter: Payer: Self-pay | Admitting: Family Medicine

## 2017-12-21 ENCOUNTER — Other Ambulatory Visit: Payer: Self-pay | Admitting: Family Medicine

## 2017-12-21 NOTE — Telephone Encounter (Signed)
Ok plus 2 ref 

## 2017-12-21 NOTE — Telephone Encounter (Signed)
Please, this is Dr. Jeannine Kitten

## 2018-01-16 ENCOUNTER — Other Ambulatory Visit: Payer: Self-pay | Admitting: Family Medicine

## 2018-05-04 ENCOUNTER — Other Ambulatory Visit: Payer: Self-pay | Admitting: Family Medicine

## 2018-05-05 NOTE — Telephone Encounter (Signed)
May ref six mo 

## 2018-05-07 DIAGNOSIS — Z23 Encounter for immunization: Secondary | ICD-10-CM | POA: Diagnosis not present

## 2018-05-24 ENCOUNTER — Encounter: Payer: Self-pay | Admitting: Family Medicine

## 2018-05-24 ENCOUNTER — Ambulatory Visit (INDEPENDENT_AMBULATORY_CARE_PROVIDER_SITE_OTHER): Payer: Medicare Other | Admitting: Family Medicine

## 2018-05-24 VITALS — BP 132/80 | Ht 63.0 in | Wt 129.6 lb

## 2018-05-24 DIAGNOSIS — R413 Other amnesia: Secondary | ICD-10-CM

## 2018-05-24 DIAGNOSIS — M810 Age-related osteoporosis without current pathological fracture: Secondary | ICD-10-CM | POA: Diagnosis not present

## 2018-05-24 DIAGNOSIS — M545 Low back pain, unspecified: Secondary | ICD-10-CM

## 2018-05-24 DIAGNOSIS — F5101 Primary insomnia: Secondary | ICD-10-CM

## 2018-05-24 MED ORDER — ALENDRONATE SODIUM 70 MG PO TABS: 70.0000 mg | ORAL_TABLET | ORAL | 0 refills | Status: DC

## 2018-05-24 MED ORDER — ZOLPIDEM TARTRATE 10 MG PO TABS: 10.0000 mg | ORAL_TABLET | Freq: Every evening | ORAL | 5 refills | Status: DC | PRN

## 2018-05-24 NOTE — Progress Notes (Signed)
   Subjective:    Patient ID: Jeanne Rogers, female    DOB: 23-Jan-1941, 78 y.o.   MRN: [id number]  HPIMed check up. Pt states no concerns today.   Back overall bette, not cpompletely  Able t do more  But then pain flares up , will take minimal meds in this regard  Known osteoporosis, handling ok, take s it faithfully   Vit d staying with the suppement   Walking off and on, not a lot    Walking several ties per wk   pts famioy rports short term memory loss  MMSE today 27/30             Review of Systems No headache, no major weight loss or weight gain, no chest pain no back pain abdominal pain no change in bowel habits complete ROS otherwise negative     Objective:   Physical Exam  Alert and oriented, vitals reviewed and stable, NAD ENT-TM's and ext canals WNL bilat via otoscopic exam Soft palate, tonsils and post pharynx WNL via oropharyngeal exam Neck-symmetric, no masses; thyroid nonpalpable and nontender Pulmonary-no tachypnea or accessory muscle use; Clear without wheezes via auscultation Card--no abnrml murmurs, rhythm reg and rate WNL Carotid pulses symmetric, without bruits       Assessment & Plan:  Impression chronic back pain.  Clinically improved.  Patient uses PRN meds  2.  Osteoporosis.  And doing Fosamax well proper use discussed  3.  Insomnia ongoing with substantial need for medication.  Ambien refilled.  4.  Short-term memory loss.  Substantial.  Very concerning to patient's husband.  Not so concerning to patient.  MMSE was done.  Patient scored 27 out of 30.  Still too high to proceed with dementia work-up.  Common loss of short-term memory discussed with family.  We will monitor this.

## 2018-06-19 ENCOUNTER — Other Ambulatory Visit: Payer: Self-pay | Admitting: Family Medicine

## 2018-06-21 NOTE — Telephone Encounter (Signed)
Six mo worth 

## 2018-07-22 ENCOUNTER — Ambulatory Visit (INDEPENDENT_AMBULATORY_CARE_PROVIDER_SITE_OTHER): Payer: Medicare Other | Admitting: Podiatry

## 2018-07-22 ENCOUNTER — Encounter: Payer: Self-pay | Admitting: Podiatry

## 2018-07-22 ENCOUNTER — Ambulatory Visit (INDEPENDENT_AMBULATORY_CARE_PROVIDER_SITE_OTHER): Payer: Medicare Other

## 2018-07-22 ENCOUNTER — Other Ambulatory Visit: Payer: Self-pay | Admitting: Podiatry

## 2018-07-22 VITALS — BP 138/84 | HR 81

## 2018-07-22 DIAGNOSIS — M2010 Hallux valgus (acquired), unspecified foot: Secondary | ICD-10-CM | POA: Diagnosis not present

## 2018-07-22 DIAGNOSIS — M2011 Hallux valgus (acquired), right foot: Secondary | ICD-10-CM | POA: Diagnosis not present

## 2018-07-22 DIAGNOSIS — M79671 Pain in right foot: Secondary | ICD-10-CM

## 2018-07-22 DIAGNOSIS — L84 Corns and callosities: Secondary | ICD-10-CM | POA: Diagnosis not present

## 2018-07-22 DIAGNOSIS — M2012 Hallux valgus (acquired), left foot: Secondary | ICD-10-CM | POA: Diagnosis not present

## 2018-07-22 DIAGNOSIS — M7751 Other enthesopathy of right foot: Secondary | ICD-10-CM | POA: Diagnosis not present

## 2018-07-22 DIAGNOSIS — M779 Enthesopathy, unspecified: Secondary | ICD-10-CM

## 2018-07-22 MED ORDER — TRIAMCINOLONE ACETONIDE 10 MG/ML IJ SUSP
10.0000 mg | Freq: Once | INTRAMUSCULAR | Status: AC
  Administered 2018-07-22: 10 mg

## 2018-07-22 NOTE — Progress Notes (Signed)
Subjective:   Patient ID: Jeanne Rogers, female   DOB: 77 y.o.   MRN: [id number]   HPI Patient presents stating she has had a lot of pain in the bottom of the right foot and she is not sure if it is a lesion or could possibly may be inflammation and she did have bunion surgery by me approximately 25 years ago.  Patient does not smoke and likes to be active   Review of Systems  All other systems reviewed and are negative.       Objective:  Physical Exam Vitals signs and nursing note reviewed.  Constitutional:      Appearance: She is well-developed.  Pulmonary:     Effort: Pulmonary effort is normal.  Musculoskeletal: Normal range of motion.  Skin:    General: Skin is warm.  Neurological:     Mental Status: She is alert.     Neurovascular status intact muscle strength adequate range of motion with in normal limits with patient found to have plantar proximal inflammation of the metatarsal phalangeal joint right with small keratotic lesion associated with it.  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  Discussed the bunion correction performed in the past  X-ray indicates that there is pin in place bilateral with wire in place right but is not impacting the current area of pain with good structural correction of both feet

## 2018-08-29 ENCOUNTER — Other Ambulatory Visit: Payer: Self-pay | Admitting: Family Medicine

## 2018-11-08 ENCOUNTER — Encounter: Payer: Medicare Other | Admitting: Family Medicine

## 2018-11-11 ENCOUNTER — Encounter: Payer: Medicare Other | Admitting: Family Medicine

## 2018-11-15 ENCOUNTER — Other Ambulatory Visit: Payer: Self-pay | Admitting: Family Medicine

## 2018-11-16 NOTE — Telephone Encounter (Signed)
6 mo ok

## 2018-12-10 ENCOUNTER — Other Ambulatory Visit: Payer: Self-pay | Admitting: Family Medicine

## 2018-12-13 NOTE — Telephone Encounter (Signed)
Ok plus one ref 

## 2018-12-30 ENCOUNTER — Other Ambulatory Visit (HOSPITAL_COMMUNITY): Payer: Self-pay | Admitting: Family Medicine

## 2018-12-30 DIAGNOSIS — Z1231 Encounter for screening mammogram for malignant neoplasm of breast: Secondary | ICD-10-CM

## 2019-01-05 ENCOUNTER — Ambulatory Visit (HOSPITAL_COMMUNITY)
Admission: RE | Admit: 2019-01-05 | Discharge: 2019-01-05 | Disposition: A | Discharge status: Home / Self Care | Payer: Medicare Other | Source: Ambulatory Visit | Attending: Family Medicine | Admitting: Family Medicine

## 2019-01-05 ENCOUNTER — Other Ambulatory Visit: Payer: Self-pay

## 2019-01-05 DIAGNOSIS — Z1231 Encounter for screening mammogram for malignant neoplasm of breast: Secondary | ICD-10-CM | POA: Diagnosis not present

## 2019-02-13 ENCOUNTER — Other Ambulatory Visit: Payer: Self-pay | Admitting: Family Medicine

## 2019-05-04 ENCOUNTER — Other Ambulatory Visit: Payer: Self-pay | Admitting: Family Medicine

## 2019-05-05 NOTE — Telephone Encounter (Signed)
Call pt, virt visit , may ref times six

## 2019-05-06 ENCOUNTER — Other Ambulatory Visit: Payer: Self-pay | Admitting: Family Medicine

## 2019-05-09 ENCOUNTER — Other Ambulatory Visit: Payer: Self-pay | Admitting: Family Medicine

## 2019-05-09 NOTE — Telephone Encounter (Signed)
rec and call for virt appt, ref times one

## 2019-05-10 NOTE — Telephone Encounter (Signed)
Patient made appointment for 10/20 for follow visit

## 2019-05-11 DIAGNOSIS — Z23 Encounter for immunization: Secondary | ICD-10-CM | POA: Diagnosis not present

## 2019-05-24 ENCOUNTER — Ambulatory Visit (INDEPENDENT_AMBULATORY_CARE_PROVIDER_SITE_OTHER): Payer: Medicare Other | Admitting: Family Medicine

## 2019-05-24 ENCOUNTER — Other Ambulatory Visit: Payer: Self-pay

## 2019-05-24 ENCOUNTER — Encounter: Payer: Self-pay | Admitting: Family Medicine

## 2019-05-24 DIAGNOSIS — M818 Other osteoporosis without current pathological fracture: Secondary | ICD-10-CM

## 2019-05-24 DIAGNOSIS — F5101 Primary insomnia: Secondary | ICD-10-CM | POA: Diagnosis not present

## 2019-05-24 DIAGNOSIS — R413 Other amnesia: Secondary | ICD-10-CM

## 2019-05-24 DIAGNOSIS — M545 Low back pain, unspecified: Secondary | ICD-10-CM

## 2019-05-24 DIAGNOSIS — M810 Age-related osteoporosis without current pathological fracture: Secondary | ICD-10-CM | POA: Diagnosis not present

## 2019-05-24 NOTE — Progress Notes (Signed)
   Subjective:    Patient ID: Jeanne Rogers, female    DOB: Sep 19, 1940, 78 y.o.   MRN: [id number]  HPI  Patient calls for a follow up on her meds. Patient states she is doing well on current meds.  Patient states she still has back pain off and on but nothing major at this time  Virtual Visit via Video Note  I connected with Jeanne Rogers on 05/24/19 at 10:00 AM EDT by a video enabled telemedicine application and verified that I am speaking with the correct person using two identifiers.  Location: Patient: home Provider: office   I discussed the limitations of evaluation and management by telemedicine and the availability of in person appointments. The patient expressed understanding and agreed to proceed.  History of Present Illness:    Observations/Objective:   Assessment and Plan:   Follow Up Instructions:    I discussed the assessment and treatment plan with the patient. The patient was provided an opportunity to ask questions and all were answered. The patient agreed with the plan and demonstrated an understanding of the instructions.   The patient was advised to call back or seek an in-person evaluation if the symptoms worsen or if the condition fails to improve as anticipated.  I provided 25 minutes of non-face-to-face time during this encounter.   Positive history of reflux.  Uses prescription Protonix intermittently.  Not having take.  This achieves good control discussed  Known history of osteoporotic disease.  Currently on Fosamax.  Handling well.  Compliance discussed patient maintain.  Also exercising fairly regularly.  History of short-term memory loss patient claims not substantial issue at this time.  Chronic back pain.  Intermittent in nature.  Patient uses as needed tramadol.  This is refilled to maintain  Patient notes just when flu shot   Review of Systems No headache, no major weight loss or weight gain, no chest pain no back pain  abdominal pain no change in bowel habits complete ROS otherwise negative     Objective:   Physical Exam Virtual       Assessment & Plan:  Impression #1 chronic back pain intermittent stable medications refilled  2.  Osteoporosis.  Compliance with Fosamax discussed to maintain  3.  Vaccines discussed flu shot already given  4.  Chronic reflux ongoing need for meds meds refilled  Follow-up in 6 months diet exercise discussed and encouraged

## 2019-06-15 ENCOUNTER — Other Ambulatory Visit: Payer: Self-pay | Admitting: Family Medicine

## 2019-06-16 NOTE — Telephone Encounter (Signed)
Last med check 05/24/19

## 2019-06-16 NOTE — Telephone Encounter (Signed)
Six mo worth 

## 2019-06-29 ENCOUNTER — Other Ambulatory Visit: Payer: Self-pay

## 2019-07-05 ENCOUNTER — Other Ambulatory Visit: Payer: Self-pay | Admitting: Family Medicine

## 2019-07-05 NOTE — Telephone Encounter (Signed)
Six mo both

## 2019-07-06 NOTE — Telephone Encounter (Signed)
6 mo all

## 2019-08-16 DIAGNOSIS — Z23 Encounter for immunization: Secondary | ICD-10-CM | POA: Diagnosis not present

## 2019-08-24 ENCOUNTER — Telehealth: Payer: Self-pay | Admitting: Family Medicine

## 2019-08-24 ENCOUNTER — Ambulatory Visit (INDEPENDENT_AMBULATORY_CARE_PROVIDER_SITE_OTHER): Payer: Medicare Other | Admitting: Family Medicine

## 2019-08-24 ENCOUNTER — Inpatient Hospital Stay (HOSPITAL_COMMUNITY)
Admission: EM | Admit: 2019-08-24 | Discharge: 2019-08-26 | DRG: 690 | Disposition: A | Discharge status: Home / Self Care | Payer: Medicare Other | Attending: Internal Medicine | Admitting: Internal Medicine

## 2019-08-24 ENCOUNTER — Encounter (HOSPITAL_COMMUNITY): Payer: Self-pay

## 2019-08-24 ENCOUNTER — Emergency Department (HOSPITAL_COMMUNITY): Payer: Medicare Other

## 2019-08-24 ENCOUNTER — Other Ambulatory Visit: Payer: Self-pay

## 2019-08-24 ENCOUNTER — Encounter: Payer: Self-pay | Admitting: Family Medicine

## 2019-08-24 VITALS — BP 150/90 | HR 107 | Temp 97.3°F | Wt 123.8 lb

## 2019-08-24 DIAGNOSIS — N390 Urinary tract infection, site not specified: Principal | ICD-10-CM | POA: Diagnosis present

## 2019-08-24 DIAGNOSIS — Z90722 Acquired absence of ovaries, bilateral: Secondary | ICD-10-CM | POA: Diagnosis not present

## 2019-08-24 DIAGNOSIS — B9620 Unspecified Escherichia coli [E. coli] as the cause of diseases classified elsewhere: Secondary | ICD-10-CM | POA: Diagnosis present

## 2019-08-24 DIAGNOSIS — Z8709 Personal history of other diseases of the respiratory system: Secondary | ICD-10-CM | POA: Diagnosis not present

## 2019-08-24 DIAGNOSIS — Z87891 Personal history of nicotine dependence: Secondary | ICD-10-CM | POA: Diagnosis not present

## 2019-08-24 DIAGNOSIS — M810 Age-related osteoporosis without current pathological fracture: Secondary | ICD-10-CM | POA: Diagnosis present

## 2019-08-24 DIAGNOSIS — I6782 Cerebral ischemia: Secondary | ICD-10-CM | POA: Diagnosis present

## 2019-08-24 DIAGNOSIS — Z823 Family history of stroke: Secondary | ICD-10-CM | POA: Diagnosis not present

## 2019-08-24 DIAGNOSIS — Z8349 Family history of other endocrine, nutritional and metabolic diseases: Secondary | ICD-10-CM

## 2019-08-24 DIAGNOSIS — Z8711 Personal history of peptic ulcer disease: Secondary | ICD-10-CM

## 2019-08-24 DIAGNOSIS — R531 Weakness: Secondary | ICD-10-CM | POA: Diagnosis not present

## 2019-08-24 DIAGNOSIS — Z79891 Long term (current) use of opiate analgesic: Secondary | ICD-10-CM

## 2019-08-24 DIAGNOSIS — R9431 Abnormal electrocardiogram [ECG] [EKG]: Secondary | ICD-10-CM | POA: Diagnosis not present

## 2019-08-24 DIAGNOSIS — R2990 Unspecified symptoms and signs involving the nervous system: Secondary | ICD-10-CM | POA: Diagnosis not present

## 2019-08-24 DIAGNOSIS — R29818 Other symptoms and signs involving the nervous system: Secondary | ICD-10-CM | POA: Diagnosis not present

## 2019-08-24 DIAGNOSIS — E860 Dehydration: Secondary | ICD-10-CM | POA: Diagnosis present

## 2019-08-24 DIAGNOSIS — Z79899 Other long term (current) drug therapy: Secondary | ICD-10-CM | POA: Diagnosis not present

## 2019-08-24 DIAGNOSIS — R262 Difficulty in walking, not elsewhere classified: Secondary | ICD-10-CM | POA: Diagnosis not present

## 2019-08-24 DIAGNOSIS — R260 Ataxic gait: Secondary | ICD-10-CM | POA: Diagnosis present

## 2019-08-24 DIAGNOSIS — Z8249 Family history of ischemic heart disease and other diseases of the circulatory system: Secondary | ICD-10-CM | POA: Diagnosis not present

## 2019-08-24 DIAGNOSIS — E785 Hyperlipidemia, unspecified: Secondary | ICD-10-CM | POA: Diagnosis not present

## 2019-08-24 DIAGNOSIS — Z20822 Contact with and (suspected) exposure to covid-19: Secondary | ICD-10-CM | POA: Diagnosis present

## 2019-08-24 DIAGNOSIS — Z888 Allergy status to other drugs, medicaments and biological substances status: Secondary | ICD-10-CM | POA: Diagnosis not present

## 2019-08-24 DIAGNOSIS — R739 Hyperglycemia, unspecified: Secondary | ICD-10-CM | POA: Diagnosis present

## 2019-08-24 DIAGNOSIS — Z7983 Long term (current) use of bisphosphonates: Secondary | ICD-10-CM

## 2019-08-24 DIAGNOSIS — G629 Polyneuropathy, unspecified: Secondary | ICD-10-CM | POA: Diagnosis present

## 2019-08-24 DIAGNOSIS — R2681 Unsteadiness on feet: Secondary | ICD-10-CM

## 2019-08-24 DIAGNOSIS — R29700 NIHSS score 0: Secondary | ICD-10-CM | POA: Diagnosis present

## 2019-08-24 DIAGNOSIS — R918 Other nonspecific abnormal finding of lung field: Secondary | ICD-10-CM | POA: Diagnosis not present

## 2019-08-24 DIAGNOSIS — R42 Dizziness and giddiness: Secondary | ICD-10-CM | POA: Diagnosis not present

## 2019-08-24 DIAGNOSIS — R3989 Other symptoms and signs involving the genitourinary system: Secondary | ICD-10-CM | POA: Diagnosis not present

## 2019-08-24 DIAGNOSIS — G459 Transient cerebral ischemic attack, unspecified: Secondary | ICD-10-CM

## 2019-08-24 HISTORY — DX: Anemia, unspecified: D64.9

## 2019-08-24 HISTORY — DX: Peptic ulcer, site unspecified, unspecified as acute or chronic, without hemorrhage or perforation: K27.9

## 2019-08-24 LAB — COMPREHENSIVE METABOLIC PANEL
ALT: 13 U/L (ref 0–44)
AST: 22 U/L (ref 15–41)
Albumin: 3.8 g/dL (ref 3.5–5.0)
Alkaline Phosphatase: 51 U/L (ref 38–126)
Anion gap: 11 (ref 5–15)
BUN: 17 mg/dL (ref 8–23)
CO2: 27 mmol/L (ref 22–32)
Calcium: 9.5 mg/dL (ref 8.9–10.3)
Chloride: 103 mmol/L (ref 98–111)
Creatinine, Ser: 1.03 mg/dL — ABNORMAL HIGH (ref 0.44–1.00)
GFR calc Af Amer: >60 mL/min (ref >60)
GFR calc non Af Amer: 52 mL/min — ABNORMAL LOW (ref >60)
Glucose, Bld: 132 mg/dL — ABNORMAL HIGH (ref 70–99)
Potassium: 5.3 mmol/L — ABNORMAL HIGH (ref 3.5–5.1)
Sodium: 141 mmol/L (ref 135–145)
Total Bilirubin: 0.6 mg/dL (ref 0.3–1.2)
Total Protein: 7.1 g/dL (ref 6.5–8.1)

## 2019-08-24 LAB — DIFFERENTIAL
Abs Immature Granulocytes: 0.02 10*3/uL (ref 0.00–0.07)
Basophils Absolute: 0.1 10*3/uL (ref 0.0–0.1)
Basophils Relative: 1 %
Eosinophils Absolute: 0 10*3/uL (ref 0.0–0.5)
Eosinophils Relative: 0 %
Immature Granulocytes: 0 %
Lymphocytes Relative: 10 %
Lymphs Abs: 1 10*3/uL (ref 0.7–4.0)
Monocytes Absolute: 0.5 10*3/uL (ref 0.1–1.0)
Monocytes Relative: 5 %
Neutro Abs: 8.2 10*3/uL — ABNORMAL HIGH (ref 1.7–7.7)
Neutrophils Relative %: 84 %

## 2019-08-24 LAB — CBC
HCT: 44.4 % (ref 36.0–46.0)
Hemoglobin: 13.9 g/dL (ref 12.0–15.0)
MCH: 28.8 pg (ref 26.0–34.0)
MCHC: 31.3 g/dL (ref 30.0–36.0)
MCV: 92.1 fL (ref 80.0–100.0)
Platelets: 392 10*3/uL (ref 150–400)
RBC: 4.82 MIL/uL (ref 3.87–5.11)
RDW: 13.9 % (ref 11.5–15.5)
WBC: 9.7 10*3/uL (ref 4.0–10.5)
nRBC: 0 % (ref 0.0–0.2)

## 2019-08-24 LAB — APTT: aPTT: 29 seconds (ref 24–36)

## 2019-08-24 LAB — PROTIME-INR
INR: 1 (ref 0.8–1.2)
Prothrombin Time: 12.7 seconds (ref 11.4–15.2)

## 2019-08-24 MED ORDER — SODIUM CHLORIDE 0.9% FLUSH
3.0000 mL | Freq: Once | INTRAVENOUS | Status: DC
Start: 2019-08-24 — End: 2019-08-26

## 2019-08-24 NOTE — ED Notes (Signed)
Keyala Chizmar daughter [phone] would like to call and give information and an update on the patient

## 2019-08-24 NOTE — Telephone Encounter (Signed)
Pt husband contacted office and states that they are going to go to [facility] to have CT scan done. Upon speaking with daughter, she feels the CT scan should be done sooner than tomorrow morning. Informed pt that I would inform provider.

## 2019-08-24 NOTE — Progress Notes (Signed)
   Subjective:    Patient ID: Jeanne Rogers, female    DOB: [birth year], 79 y.o.   MRN: [id number]  HPI Pt presents as a walk-in with acute symptoms.  Family called before lunch and based on symptoms was advised to go to the emergency room.  Both the patient's husband Jeanne Rogers and the patient Jeanne Rogers emergency room at this time due to the high prevalence of Covid.  Patient notes this morning she was sitting playing cards with her granddaughter on the carpet.  At that time she felt fine.  The patient's granddaughter reports that her grandmother was acting completely normal at that time.  The patient states she went into the other room sat on the bed and started working on [employer].  At 1 point she felt unsteady and somewhat queasy and possibly even a bit dizzy and was reluctant to stand up.  The patient did not vomit.  She develops no headache.  She stated this lasted a few minutes.  She called her husband.  Now reports that she feels fine.   Review of Systems No headache no chest pain no shortness of breath    Objective:   Physical Exam  Alert active no acute distress.  Talkative.  Speech clear.  Pupils equal round reactive to light.  No noticeable focal neurological deficits.  Strength excellent in her hands and feet.  Reflexes normal.  Facial movements normal.  Lungs clear.  Heart regular rate and rhythm.  EKG normal sinus rhythm no significant ST-T changes        Assessment & Plan:  Impression acute event etiology unclear.  This could represent a TIA or even as CVA.  This is why we initially recommended the patient go to the emergency room.  The patient declined and still is inclined not to go to the emergency room since at this time she feels fine.  EKG looks good.  We will order a CT scan of the head to rule out any type of hemorrhage or obvious stroke.  Patient took a baby aspirin today.  If CT scan negative will maintain that.  Addendum CT folks are overwhelmed and stated at least a  dozen scans in front of her and would go late into the evening.  Based on this I okayed a scan to be done at 8 AM the patient completely symptom-free again and not wanting to go to the ER later in the evening the patient's husband called and stated their daughter who is s emergency doctor wanted to her to go to [facility] urgently for scan  Greater than 50% of this 40 minute face to face visit was spent in counseling and discussion and coordination of care regarding the above diagnosis/diagnosies

## 2019-08-24 NOTE — ED Triage Notes (Signed)
Pt sent here by primary doctor to rule out a stroke, pt unable to say what symptoms she was having that made her call her PCP, this RN tried to call husband but did not get an answer, pt denies unilateral weakness or headache. No slurred speech or facial droop noted. Pt alert, oriented x3 . No neuro deficits noted.

## 2019-08-24 NOTE — ED Provider Notes (Signed)
[facility] EMERGENCY DEPARTMENT Provider Note   CSN: [id number] Arrival date & time: 08/24/19  1704     History Chief Complaint  Patient presents with  . Altered Mental Status    Jadora Carraher is a 79 y.o. female.  HPI   79 year old female with possible TIA.  Symptom onset around 11:30 AM today.  She was watching her granddaughter when she went to lay down on the bed which is unusual for her.  Subsequently found confused sitting on the edge of the bed.  They called her husband who came up from his workshop to check on her.  He states that when he found her she was awake and speaking but did not want to get up and walk.  She felt like she could not.  Is feeling dizzy/lightheaded or having vertiginous symptoms.  She does had a feeling that she may fall if he tried to walk.  She was able to get up by herself and walk to her bathroom with her husband walking beside her although he says he did not provide her significant assistance.  She seemed to do this okay although she did ask him to bring a walker down from the attic for.  Past Medical History:  Diagnosis Date  . Collapse, lung   . Complication of anesthesia    very sensitive to anesthesia  . Hyperlipidemia   . Osteoporosis   . Spontaneous pneumothorax     Patient Active Problem List   Diagnosis Date Noted  . 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 Anemia, iron deficiency 01/15/2014  . Postural imbalance 01/12/2014  . Low back pain 10/13/2013  . Compression fracture 07/26/2013  . Vitamin D deficiency 07/26/2013  . Osteoporosis 06/29/2013  . Back pain 06/29/2013    Past Surgical History:  Procedure Laterality Date  . COLONOSCOPY N/A 01/04/2014   Procedure: COLONOSCOPY;  Surgeon: Rogene Houston, MD;  [facility];  Service: Endoscopy;  Laterality: N/A;  200  . ESOPHAGOGASTRODUODENOSCOPY N/A 01/04/2014   Procedure: ESOPHAGOGASTRODUODENOSCOPY (EGD);  Surgeon: Rogene Houston, MD;  [facility];   Service: Endoscopy;  Laterality: N/A;  . hysterectomy    . MASS EXCISION Right 02/07/2016   Procedure: EXCISION MASS RIGHT INDEX FINGER AND RIGHT HAND MASS;  Surgeon: Leanora Cover, MD;  Location: [facility];  Service: Orthopedics;  Laterality: Right;  . OOPHORECTOMY Bilateral 2005     OB History   No obstetric history on file.     Family History  Problem Relation Age of Onset  . Hyperlipidemia Mother   . Hypertension Father     Social History   Tobacco Use  . Smoking status: Former Research scientist (life sciences)  . Smokeless tobacco: Former Systems developer    Quit date: 08/04/1988  Substance Use Topics  . Alcohol use: No  . Drug use: No    Home Medications Prior to Admission medications   Medication Sig Start Date End Date Taking? Authorizing Provider  alendronate (FOSAMAX) 70 MG tablet TAKE 1 TABLET BY MOUTH EVERY WEEK 07/06/19   Mikey Kirschner, MD  cholecalciferol (VITAMIN D) 1000 units tablet Take 5,000 Units by mouth daily.    [provider]  Multiple Vitamin (MULTIVITAMIN) tablet Take 1 tablet by mouth daily.    [provider]  pantoprazole (PROTONIX) 40 MG tablet TAKE 1 TABLET BY MOUTH DAILY 12/25/15   [provider]  traMADol (ULTRAM) 50 MG tablet TAKE 1 TABLET BY MOUTH TWICE DAILY 07/06/19   Luking,  Grace Bushy, MD  Vitamin D, Ergocalciferol, (DRISDOL) 1.25 MG (50000 UT) CAPS capsule Take by mouth.    [provider]  zolpidem (AMBIEN) 10 MG tablet TAKE 1 TABLET BY MOUTH EVERY DAY AT BEDTIME AS NEEDED FOR SLEEP 07/06/19   Mikey Kirschner, MD    Allergies    Methergine [methylergonovine maleate]  Review of Systems   Review of Systems All systems reviewed and negative, other than as noted in HPI. Physical Exam Updated Vital Signs BP (!) 157/80   Pulse 81   Temp 98.4 F (36.9 C) (Oral)   Resp 14   SpO2 97%   Physical Exam Vitals and nursing note reviewed.  Constitutional:      General: She is not in acute distress.    Appearance: She is  well-developed.  HENT:     Head: Normocephalic and atraumatic.  Eyes:     General:        Right eye: No discharge.        Left eye: No discharge.     Conjunctiva/sclera: Conjunctivae normal.  Cardiovascular:     Rate and Rhythm: Normal rate and regular rhythm.     Heart sounds: Normal heart sounds. No murmur. No friction rub. No gallop.   Pulmonary:     Effort: Pulmonary effort is normal. No respiratory distress.     Breath sounds: Normal breath sounds.  Abdominal:     General: There is no distension.     Palpations: Abdomen is soft.     Tenderness: There is no abdominal tenderness.  Musculoskeletal:        General: No tenderness.     Cervical back: Neck supple.  Skin:    General: Skin is warm and dry.  Neurological:     General: No focal deficit present.     Mental Status: She is alert and oriented to person, place, and time.     Cranial Nerves: No cranial nerve deficit.     Sensory: No sensory deficit.     Motor: No weakness.     Coordination: Coordination normal.  Psychiatric:        Behavior: Behavior normal.        Thought Content: Thought content normal.     ED Results / Procedures / Treatments   Labs (all labs ordered are listed, but only abnormal results are displayed) Labs Reviewed  URINE CULTURE - Abnormal; Notable for the following components:      Result Value   Culture   (*)    Value: >=100,000 COLONIES/mL ESCHERICHIA COLI SUSCEPTIBILITIES TO FOLLOW Performed at [facility], [address], [place], [place] [address]    All other components within normal limits  DIFFERENTIAL - Abnormal; Notable for the following components:   Neutro Abs 8.2 (*)    All other components within normal limits  COMPREHENSIVE METABOLIC PANEL - Abnormal; Notable for the following components:   Potassium 5.3 (*)    Glucose, Bld 132 (*)    Creatinine, Ser 1.03 (*)    GFR calc non Af Amer 52 (*)    All other components within normal limits  URINALYSIS, ROUTINE W  REFLEX MICROSCOPIC - Abnormal; Notable for the following components:   APPearance HAZY (*)    Nitrite POSITIVE (*)    Leukocytes,Ua LARGE (*)    WBC, UA >50 (*)    Bacteria, UA MANY (*)    Non Squamous Epithelial 0-5 (*)    All other components within normal limits  COMPREHENSIVE  METABOLIC PANEL - Abnormal; Notable for the following components:   Glucose, Bld 100 (*)    Total Protein 6.4 (*)    All other components within normal limits  LIPID PANEL - Abnormal; Notable for the following components:   Cholesterol 240 (*)    LDL Cholesterol 150 (*)    All other components within normal limits  COMPREHENSIVE METABOLIC PANEL - Abnormal; Notable for the following components:   Total Protein 6.0 (*)    GFR calc non Af Amer 59 (*)    All other components within normal limits  CBG MONITORING, ED - Abnormal; Notable for the following components:   Glucose-Capillary 103 (*)    All other components within normal limits  SARS CORONAVIRUS 2 (TAT 6-24 HRS)  PROTIME-INR  APTT  CBC  HEMOGLOBIN A1C  CBC  CBC WITH DIFFERENTIAL/PLATELET  TSH  PHOSPHORUS  MAGNESIUM  CBC WITH DIFFERENTIAL/PLATELET    EKG EKG Interpretation  Date/Time:  Wednesday August 24 2019 17:11:31 EST Ventricular Rate:  97 PR Interval:  136 QRS Duration: 76 QT Interval:  344 QTC Calculation: 436 R Axis:   75 Text Interpretation: Normal sinus rhythm Biatrial enlargement Nonspecific ST and T wave abnormality Abnormal ECG Confirmed by Carmin Muskrat [phone]) on 08/25/2019 10:41:22 AM   Radiology CT HEAD WO CONTRAST  Result Date: 08/24/2019 CLINICAL DATA:  Focal neurological deficit. EXAM: CT HEAD WITHOUT CONTRAST TECHNIQUE: Contiguous axial images were obtained from the base of the skull through the vertex without intravenous contrast. COMPARISON:  February 20, 2009 FINDINGS: Brain: There is mild cerebral atrophy with widening of the extra-axial spaces and ventricular dilatation. There are areas of decreased attenuation  within the white matter tracts of the supratentorial brain, consistent with microvascular disease changes. Vascular: No hyperdense vessel or unexpected calcification. Skull: Normal. Negative for fracture or focal lesion. Sinuses/Orbits: No acute finding. Other: None. IMPRESSION: 1. Generalized cerebral atrophy. 2. No acute intracranial abnormality. Electronically Signed   By: Virgina Norfolk M.D.   On: 08/24/2019 21:52    Procedures Procedures (including critical care time)  Medications Ordered in ED Medications  sodium chloride flush (NS) 0.9 % injection 3 mL (has no administration in time range)    ED Course  I have reviewed the triage vital signs and the nursing notes.  Pertinent labs & imaging results that were available during my care of the patient were reviewed by me and considered in my medical decision making (see chart for details).    MDM Rules/Calculators/A&P                      78yM possible TIA.  Her neuro exam is nonfocal.  Patient seems to think that this is just a transient event of insignificant importance.  Family is very concerned about what they witnessed though concern for TIA.  Will admit for further work-up of this.  Final Clinical Impression(s) / ED Diagnoses Final diagnoses:  TIA (transient ischemic attack)    Rx / DC Orders ED Discharge Orders    None       Virgel Manifold, MD 08/26/19 1736

## 2019-08-24 NOTE — H&P (Signed)
[facility] H&P    Patient Demographics:    Jeanne Rogers, is a 79 y.o. female  MRN: [id number]  DOB - 1941/07/13  Admit Date - 08/24/2019  Referring MD/NP/PA: Wilson Singer  Outpatient Primary MD for the patient is Luking, Grace Bushy, MD  Patient coming from:  home  Chief complaint-  Difficulty with walking   HPI:    Jeanne Rogers  is a 79 y.o. female, w hyperlipidemia, apparently called her husband around 11am to help her walk to bathroom since she couldn't by herself, pt denies headache, vision change, slurred speech,  Cp, palp, sob, n/v, abd pain, diarrhea, brbpr, black stool dysuria, numbness, tingling, focal weakness.  Pt was seen by PCP and CT ordered but patients daughter in law sent her to [facility].   Took an aspirin 11:30 am this morning, no recent changes to medication.  Just takes fosamax.   Pt states that her symptoms of difficulty walking lasted about 30 minutes  In Ed,  T 97.5  P 101 R 18, Bp 169/97  Pox 100% on RA  CT brain  IMPRESSION: 1. Generalized cerebral atrophy. 2. No acute intracranial abnormality.  Na 141, K 5.3, Bun 17, Creatinne 1.03 Ast 22, Alt 13 Wbc 9.7 Hgb 13.9, Plt 392 INR 1.0  ED didn't consult neurology   Pt will be admitted for ataxia, ? TIA.         Review of systems:    In addition to the HPI above,  No Fever-chills, No Headache, No changes with Vision or hearing, No problems swallowing food or Liquids, No Chest pain, Cough or Shortness of Breath, No Abdominal pain, No Nausea or Vomiting, bowel movements are regular, No Blood in stool or Urine, No dysuria, No new skin rashes or bruises, No new joints pains-aches,  No new weakness, tingling, numbness in any extremity, No recent weight gain or loss, No polyuria, polydypsia or polyphagia, No significant Mental Stressors.  All other systems reviewed and are negative.    Past History of the following :     Past Medical History:  Diagnosis Date  . Collapse, lung   . Complication of anesthesia    very sensitive to anesthesia  . Hyperlipidemia   . Osteoporosis   . Spontaneous pneumothorax       Past Surgical History:  Procedure Laterality Date  . COLONOSCOPY N/A 01/04/2014   Procedure: COLONOSCOPY;  Surgeon: Rogene Houston, MD;  [facility];  Service: Endoscopy;  Laterality: N/A;  200  . ESOPHAGOGASTRODUODENOSCOPY N/A 01/04/2014   Procedure: ESOPHAGOGASTRODUODENOSCOPY (EGD);  Surgeon: Rogene Houston, MD;  [facility];  Service: Endoscopy;  Laterality: N/A;  . hysterectomy    . MASS EXCISION Right 02/07/2016   Procedure: EXCISION MASS RIGHT INDEX FINGER AND RIGHT HAND MASS;  Surgeon: Leanora Cover, MD;  Location: [facility];  Service: Orthopedics;  Laterality: Right;  . OOPHORECTOMY Bilateral 2005      Social History:      Social History   Tobacco Use  . Smoking status:  Former Smoker  . Smokeless tobacco: Former Systems developer    Quit date: 08/04/1988  Substance Use Topics  . Alcohol use: No       Family History :     Family History  Problem Relation Age of Onset  . Hyperlipidemia Mother   . Hypertension Father        Home Medications:   Prior to Admission medications   Medication Sig Start Date End Date Taking? Authorizing Provider  alendronate (FOSAMAX) 70 MG tablet TAKE 1 TABLET BY MOUTH EVERY WEEK 07/06/19   Mikey Kirschner, MD  cholecalciferol (VITAMIN D) 1000 units tablet Take 5,000 Units by mouth daily.    [provider]  Multiple Vitamin (MULTIVITAMIN) tablet Take 1 tablet by mouth daily.    [provider]  pantoprazole (PROTONIX) 40 MG tablet TAKE 1 TABLET BY MOUTH DAILY 12/25/15   [provider]  traMADol (ULTRAM) 50 MG tablet TAKE 1 TABLET BY MOUTH TWICE DAILY 07/06/19   Mikey Kirschner, MD  Vitamin D, Ergocalciferol, (DRISDOL) 1.25 MG (50000 UT) CAPS capsule Take by mouth.    [provider]  zolpidem (AMBIEN) 10 MG tablet TAKE 1 TABLET BY MOUTH EVERY DAY AT BEDTIME AS NEEDED FOR SLEEP 07/06/19   Mikey Kirschner, MD     Allergies:     Allergies  Allergen Reactions  . Methergine [Methylergonovine Maleate] Other (See Comments)    Unknown     Physical Exam:   Vitals  Blood pressure (!) 157/80, pulse 81, temperature 98.4 F (36.9 C), temperature source Oral, resp. rate 14, SpO2 97 %.  1.  General: axoxo3  2. Psychiatric: euthymic  3. Neurologic: Cn 2-12 intact, reflexes 2+ symmetric, diffuse with no clonus, motor 5/5 in all 4 ext, no pronator drift.   4. HEENMT:  Anicteric, pupils 1.27mm symmetric, direct, consensual, near intact, eomi Neck: no jvd, no bruit  5. Respiratory : CTAB  6. Cardiovascular : rrr s1, s2, no m/g/r  7. Gastrointestinal:  Abd: soft, nt, nd, +bs  8. Skin:  Ext: no c/c/e, no rash  9.Musculoskeletal:  Good rom  Able to ambulate without assistance, no shuffling gait    Data Review:    CBC Recent Labs  Lab 08/24/19 1716  WBC 9.7  HGB 13.9  HCT 44.4  PLT 392  MCV 92.1  MCH 28.8  MCHC 31.3  RDW 13.9  LYMPHSABS 1.0  MONOABS 0.5  EOSABS 0.0  BASOSABS 0.1   ------------------------------------------------------------------------------------------------------------------  Results for orders placed or performed during the hospital encounter of 08/24/19 (from the past 48 hour(s))  Protime-INR     Status: None   Collection Time: 08/24/19  5:16 PM  Result Value Ref Range   Prothrombin Time 12.7 11.4 - 15.2 seconds   INR 1.0 0.8 - 1.2    Comment: (NOTE) INR goal varies based on device and disease states. Performed at [facility], [place] [address] [place], [place] [address]   APTT     Status: None   Collection Time: 08/24/19  5:16 PM  Result Value Ref Range   aPTT 29 24 - 36 seconds    Comment: Performed at [facility] [address] [place] [address]  CBC     Status: None   Collection  Time: 08/24/19  5:16 PM  Result Value Ref Range   WBC 9.7 4.0 - 10.5 K/uL   RBC 4.82 3.87 - 5.11 MIL/uL   Hemoglobin 13.9 12.0 - 15.0 g/dL  HCT 44.4 36.0 - 46.0 %   MCV 92.1 80.0 - 100.0 fL   MCH 28.8 26.0 - 34.0 pg   MCHC 31.3 30.0 - 36.0 g/dL   RDW 13.9 11.5 - 15.5 %   Platelets 392 150 - 400 K/uL   nRBC 0.0 0.0 - 0.2 %    Comment: Performed at [facility], [place] [address], [place], [place] [address]  Differential     Status: Abnormal   Collection Time: 08/24/19  5:16 PM  Result Value Ref Range   Neutrophils Relative % 84 %   Neutro Abs 8.2 (H) 1.7 - 7.7 K/uL   Lymphocytes Relative 10 %   Lymphs Abs 1.0 0.7 - 4.0 K/uL   Monocytes Relative 5 %   Monocytes Absolute 0.5 0.1 - 1.0 K/uL   Eosinophils Relative 0 %   Eosinophils Absolute 0.0 0.0 - 0.5 K/uL   Basophils Relative 1 %   Basophils Absolute 0.1 0.0 - 0.1 K/uL   Immature Granulocytes 0 %   Abs Immature Granulocytes 0.02 0.00 - 0.07 K/uL    Comment: Performed at [facility] [address], [place], [place] [address]  Comprehensive metabolic panel     Status: Abnormal   Collection Time: 08/24/19  5:16 PM  Result Value Ref Range   Sodium 141 135 - 145 mmol/L   Potassium 5.3 (H) 3.5 - 5.1 mmol/L   Chloride 103 98 - 111 mmol/L   CO2 27 22 - 32 mmol/L   Glucose, Bld 132 (H) 70 - 99 mg/dL   BUN 17 8 - 23 mg/dL   Creatinine, Ser 1.03 (H) 0.44 - 1.00 mg/dL   Calcium 9.5 8.9 - 10.3 mg/dL   Total Protein 7.1 6.5 - 8.1 g/dL   Albumin 3.8 3.5 - 5.0 g/dL   AST 22 15 - 41 U/L   ALT 13 0 - 44 U/L   Alkaline Phosphatase 51 38 - 126 U/L   Total Bilirubin 0.6 0.3 - 1.2 mg/dL   GFR calc non Af Amer 52 (L) >60 mL/min   GFR calc Af Amer >60 >60 mL/min   Anion gap 11 5 - 15    Comment: Performed at [facility], [place] [address] [place], [place] [address]    Chemistries  Recent Labs  Lab 08/24/19 1716  NA 141  K 5.3*  CL 103  CO2 27  GLUCOSE 132*  BUN 17  CREATININE 1.03*  CALCIUM 9.5  AST 22  ALT 13   ALKPHOS 51  BILITOT 0.6   ------------------------------------------------------------------------------------------------------------------  ------------------------------------------------------------------------------------------------------------------ GFR: CrCl cannot be calculated (Unknown ideal weight.). Liver Function Tests: Recent Labs  Lab 08/24/19 1716  AST 22  ALT 13  ALKPHOS 51  BILITOT 0.6  PROT 7.1  ALBUMIN 3.8   No results for input(s): LIPASE, AMYLASE in the last 168 hours. No results for input(s): AMMONIA in the last 168 hours. Coagulation Profile: Recent Labs  Lab 08/24/19 1716  INR 1.0   Cardiac Enzymes: No results for input(s): CKTOTAL, CKMB, CKMBINDEX, TROPONINI in the last 168 hours. BNP (last 3 results) No results for input(s): PROBNP in the last 8760 hours. HbA1C: No results for input(s): HGBA1C in the last 72 hours. CBG: No results for input(s): GLUCAP in the last 168 hours. Lipid Profile: No results for input(s): CHOL, HDL, LDLCALC, TRIG, CHOLHDL, LDLDIRECT in the last 72 hours. Thyroid Function Tests: No results for input(s): TSH, T4TOTAL, FREET4, T3FREE, THYROIDAB in the last 72 hours. Anemia  Panel: No results for input(s): VITAMINB12, FOLATE, FERRITIN, TIBC, IRON, RETICCTPCT in the last 72 hours.  --------------------------------------------------------------------------------------------------------------- Urine analysis:    Component Value Date/Time   LEUKOCYTESUR Trace (A) 07/16/2017 0935      Imaging Results:    CT HEAD WO CONTRAST  Result Date: 08/24/2019 CLINICAL DATA:  Focal neurological deficit. EXAM: CT HEAD WITHOUT CONTRAST TECHNIQUE: Contiguous axial images were obtained from the base of the skull through the vertex without intravenous contrast. COMPARISON:  February 20, 2009 FINDINGS: Brain: There is mild cerebral atrophy with widening of the extra-axial spaces and ventricular dilatation. There are areas of decreased  attenuation within the white matter tracts of the supratentorial brain, consistent with microvascular disease changes. Vascular: No hyperdense vessel or unexpected calcification. Skull: Normal. Negative for fracture or focal lesion. Sinuses/Orbits: No acute finding. Other: None. IMPRESSION: 1. Generalized cerebral atrophy. 2. No acute intracranial abnormality. Electronically Signed   By: Virgina Norfolk M.D.   On: 08/24/2019 21:52   ekg nsr at 95, nl axis, slight st depression v4-6   Assessment & Plan:    Active Problems:   TIA (transient ischemic attack)  Ataxia, difficulty walking  ? TIA per ED MRI brain Carotid ultrasound Cardiac echo  Check hga1c, lipid,  PT/OT/ speech Permissive hypertension Aspirin  Start Lipitor 80mg  po qhs Please consult neurology in am  H/o PUD Might not be able to take aspirin long term due to hx of PUD per husband ?  Osteoporosis Hold Fosamax for now  DVT Prophylaxis-   SCDs   AM Labs Ordered, also please review Full Orders  Family Communication: Admission, patients condition and plan of care including tests being ordered have been discussed with the patient  who indicate understanding and agree with the plan and Code Status.  Code Status:  FULL CODE per patient,  Husband present with patient in ED  Admission status: Observation: Based on patients clinical presentation and evaluation of above clinical data, I have made determination that patient meets observation criteria at this time.  Time spent in minutes : 55 minutes   Jani Gravel M.D on 08/24/2019 at 11:32 PM

## 2019-08-25 ENCOUNTER — Ambulatory Visit (HOSPITAL_COMMUNITY): Admission: RE | Admit: 2019-08-25 | Payer: Medicare Other | Source: Ambulatory Visit

## 2019-08-25 ENCOUNTER — Observation Stay (HOSPITAL_COMMUNITY): Payer: Medicare Other

## 2019-08-25 ENCOUNTER — Encounter (HOSPITAL_COMMUNITY): Payer: Self-pay | Admitting: *Deleted

## 2019-08-25 ENCOUNTER — Encounter: Payer: Self-pay | Admitting: Family Medicine

## 2019-08-25 ENCOUNTER — Observation Stay (HOSPITAL_BASED_OUTPATIENT_CLINIC_OR_DEPARTMENT_OTHER): Payer: Medicare Other

## 2019-08-25 DIAGNOSIS — G459 Transient cerebral ischemic attack, unspecified: Secondary | ICD-10-CM

## 2019-08-25 DIAGNOSIS — R3989 Other symptoms and signs involving the genitourinary system: Secondary | ICD-10-CM

## 2019-08-25 DIAGNOSIS — R531 Weakness: Secondary | ICD-10-CM | POA: Diagnosis not present

## 2019-08-25 DIAGNOSIS — I6782 Cerebral ischemia: Secondary | ICD-10-CM | POA: Diagnosis not present

## 2019-08-25 DIAGNOSIS — R262 Difficulty in walking, not elsewhere classified: Secondary | ICD-10-CM | POA: Diagnosis not present

## 2019-08-25 DIAGNOSIS — R918 Other nonspecific abnormal finding of lung field: Secondary | ICD-10-CM | POA: Diagnosis not present

## 2019-08-25 LAB — CBC
HCT: 41 % (ref 36.0–46.0)
Hemoglobin: 13.5 g/dL (ref 12.0–15.0)
MCH: 29.7 pg (ref 26.0–34.0)
MCHC: 32.9 g/dL (ref 30.0–36.0)
MCV: 90.3 fL (ref 80.0–100.0)
Platelets: 335 10*3/uL (ref 150–400)
RBC: 4.54 MIL/uL (ref 3.87–5.11)
RDW: 14.5 % (ref 11.5–15.5)
WBC: 8.1 10*3/uL (ref 4.0–10.5)
nRBC: 0 % (ref 0.0–0.2)

## 2019-08-25 LAB — CBC WITH DIFFERENTIAL/PLATELET
Abs Immature Granulocytes: 0.01 10*3/uL (ref 0.00–0.07)
Basophils Absolute: 0.1 10*3/uL (ref 0.0–0.1)
Basophils Relative: 1 %
Eosinophils Absolute: 0 10*3/uL (ref 0.0–0.5)
Eosinophils Relative: 1 %
HCT: 42.9 % (ref 36.0–46.0)
Hemoglobin: 13.7 g/dL (ref 12.0–15.0)
Immature Granulocytes: 0 %
Lymphocytes Relative: 15 %
Lymphs Abs: 1.2 10*3/uL (ref 0.7–4.0)
MCH: 29.3 pg (ref 26.0–34.0)
MCHC: 31.9 g/dL (ref 30.0–36.0)
MCV: 91.7 fL (ref 80.0–100.0)
Monocytes Absolute: 0.7 10*3/uL (ref 0.1–1.0)
Monocytes Relative: 9 %
Neutro Abs: 6.1 10*3/uL (ref 1.7–7.7)
Neutrophils Relative %: 74 %
Platelets: 365 10*3/uL (ref 150–400)
RBC: 4.68 MIL/uL (ref 3.87–5.11)
RDW: 14.5 % (ref 11.5–15.5)
WBC: 8.1 10*3/uL (ref 4.0–10.5)
nRBC: 0 % (ref 0.0–0.2)

## 2019-08-25 LAB — LIPID PANEL
Cholesterol: 240 mg/dL — ABNORMAL HIGH (ref 0–200)
HDL: 74 mg/dL (ref >40)
LDL Cholesterol: 150 mg/dL — ABNORMAL HIGH (ref 0–99)
Total CHOL/HDL Ratio: 3.2 RATIO
Triglycerides: 82 mg/dL (ref <150)
VLDL: 16 mg/dL (ref 0–40)

## 2019-08-25 LAB — COMPREHENSIVE METABOLIC PANEL
ALT: 13 U/L (ref 0–44)
AST: 18 U/L (ref 15–41)
Albumin: 3.7 g/dL (ref 3.5–5.0)
Alkaline Phosphatase: 49 U/L (ref 38–126)
Anion gap: 9 (ref 5–15)
BUN: 16 mg/dL (ref 8–23)
CO2: 28 mmol/L (ref 22–32)
Calcium: 9.1 mg/dL (ref 8.9–10.3)
Chloride: 105 mmol/L (ref 98–111)
Creatinine, Ser: 0.89 mg/dL (ref 0.44–1.00)
GFR calc Af Amer: >60 mL/min (ref >60)
GFR calc non Af Amer: >60 mL/min (ref >60)
Glucose, Bld: 100 mg/dL — ABNORMAL HIGH (ref 70–99)
Potassium: 4.9 mmol/L (ref 3.5–5.1)
Sodium: 142 mmol/L (ref 135–145)
Total Bilirubin: 0.4 mg/dL (ref 0.3–1.2)
Total Protein: 6.4 g/dL — ABNORMAL LOW (ref 6.5–8.1)

## 2019-08-25 LAB — URINALYSIS, ROUTINE W REFLEX MICROSCOPIC
Bilirubin Urine: NEGATIVE
Glucose, UA: NEGATIVE mg/dL
Hgb urine dipstick: NEGATIVE
Ketones, ur: NEGATIVE mg/dL
Nitrite: POSITIVE — AB
Protein, ur: NEGATIVE mg/dL
Specific Gravity, Urine: 1.006 (ref 1.005–1.030)
WBC, UA: >50 WBC/hpf — ABNORMAL HIGH (ref 0–5)
pH: 6 (ref 5.0–8.0)

## 2019-08-25 LAB — CBG MONITORING, ED: Glucose-Capillary: 103 mg/dL — ABNORMAL HIGH (ref 70–99)

## 2019-08-25 LAB — ECHOCARDIOGRAM COMPLETE
Height: 63 in
Weight: 1943.58 oz

## 2019-08-25 LAB — SARS CORONAVIRUS 2 (TAT 6-24 HRS): SARS Coronavirus 2: NEGATIVE

## 2019-08-25 LAB — TSH: TSH: 2.521 u[IU]/mL (ref 0.350–4.500)

## 2019-08-25 LAB — HEMOGLOBIN A1C
Hgb A1c MFr Bld: 5.2 % (ref 4.8–5.6)
Mean Plasma Glucose: 102.54 mg/dL

## 2019-08-25 MED ORDER — ASPIRIN 300 MG RE SUPP: 300.0000 mg | Freq: Every day | RECTAL | Status: DC

## 2019-08-25 MED ORDER — ASPIRIN 325 MG PO TABS
325.0000 mg | ORAL_TABLET | Freq: Every day | ORAL | Status: DC
  Administered 2019-08-25 – 2019-08-26 (×2): 325 mg via ORAL
  Filled 2019-08-25 (×2): qty 1

## 2019-08-25 MED ORDER — ACETAMINOPHEN 325 MG PO TABS: 650.0000 mg | ORAL_TABLET | ORAL | Status: DC | PRN

## 2019-08-25 MED ORDER — TRAMADOL HCL 50 MG PO TABS: 50.0000 mg | ORAL_TABLET | Freq: Two times a day (BID) | ORAL | Status: DC | PRN

## 2019-08-25 MED ORDER — ZOLPIDEM TARTRATE 5 MG PO TABS: 5.0000 mg | ORAL_TABLET | Freq: Every evening | ORAL | Status: DC | PRN

## 2019-08-25 MED ORDER — SODIUM CHLORIDE 0.9 % IV SOLN
1.0000 g | INTRAVENOUS | Status: DC
  Administered 2019-08-25 – 2019-08-26 (×2): 1 g via INTRAVENOUS
  Filled 2019-08-25 (×2): qty 10

## 2019-08-25 MED ORDER — ACETAMINOPHEN 650 MG RE SUPP: 650.0000 mg | RECTAL | Status: DC | PRN

## 2019-08-25 MED ORDER — STROKE: EARLY STAGES OF RECOVERY BOOK
Freq: Once | Status: AC
  Filled 2019-08-25: qty 1

## 2019-08-25 MED ORDER — ACETAMINOPHEN 160 MG/5ML PO SOLN: 650.0000 mg | ORAL | Status: DC | PRN

## 2019-08-25 NOTE — Evaluation (Signed)
Speech Language Pathology Evaluation Patient Details Name: Jeanne Rogers MRN: [id number] DOB: Jul 23, 1941 Today's Date: 08/25/2019 Time: 1003-1020 SLP Time Calculation (min) (ACUTE ONLY): 17 min  Problem List:  Patient Active Problem List   Diagnosis Date Noted  . TIA (transient ischemic attack) 08/24/2019  . 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 Anemia, iron deficiency 01/15/2014  . Postural imbalance 01/12/2014  . Low back pain 10/13/2013  . Compression fracture 07/26/2013  . Vitamin D deficiency 07/26/2013  . Osteoporosis 06/29/2013  . Back pain 06/29/2013   Past Medical History:  Past Medical History:  Diagnosis Date  . Anemia   . Collapse, lung   . Complication of anesthesia    very sensitive to anesthesia  . Hyperlipidemia   . Osteoporosis   . PUD (peptic ulcer disease)   . Spontaneous pneumothorax    Past Surgical History:  Past Surgical History:  Procedure Laterality Date  . COLONOSCOPY N/A 01/04/2014   Procedure: COLONOSCOPY;  Surgeon: Rogene Houston, MD;  [facility];  Service: Endoscopy;  Laterality: N/A;  200  . ESOPHAGOGASTRODUODENOSCOPY N/A 01/04/2014   Procedure: ESOPHAGOGASTRODUODENOSCOPY (EGD);  Surgeon: Rogene Houston, MD;  Location: [facility];  Service: Endoscopy;  Laterality: N/A;  . hysterectomy    . MASS EXCISION Right 02/07/2016   Procedure: EXCISION MASS RIGHT INDEX FINGER AND RIGHT HAND MASS;  Surgeon: Leanora Cover, MD;  Location: [facility];  Service: Orthopedics;  Laterality: Right;  . OOPHORECTOMY Bilateral 2005   HPI:  Jeanne Rogers  is a 79 y.o. female, w hyperlipidemia, apparently called her husband around 11am to help her walk to bathroom since she couldn't by herself, pt denies headache, vision change, slurred speech,  Cp, palp, sob, n/v, abd pain, diarrhea, brbpr, black stool dysuria, numbness, tingling, focal weakness.  MRI 1/21 negative for acute findings.   Assessment / Plan / Recommendation Clinical Impression  Pt  presents with largely functional cognitive abilities.  She was assessed using the COGNISTAT (see below for additional information).  All tasks were within the average range except for the delayed word recall task, on which she showed severe impairment.   Pt was able to independently generate memory compensatory strategy (write it down).  Pt does not think that this marks a departure from her baseline level of funciton.  Husband in agreement and agrees with his wife that he would not have been able to recall word list either.  Pt does not have any concerns about ability to complete ADLs.  Pt was very quick to answer most questions.  Pt's speech was clear and free from dysarthria. She denies any word finding difficulties and was able to participate in conversation without difficulty.   COGNISTAT - all subtests are within the average range, except where otherwise specified Orientation: 11/12 Attention: 8/8 Comprehension: 5/6 Repetition: 12/12 Naming: 8/8 Construction: not assessed Memory: 3/12, Severe impairment Calculation: 4/4 Similarities: 8/8 Judgment: 6/6    SLP Assessment  SLP Recommendation/Assessment: Patient does not need any further Speech Lanaguage Pathology Services SLP Visit Diagnosis: Cognitive communication deficit (R41.841)    Follow Up Recommendations  None    Frequency and Duration   N/A        SLP Evaluation Cognition  Overall Cognitive Status: Within Functional Limits for tasks assessed Arousal/Alertness: Awake/alert Orientation Level: Oriented X4 Memory: Impaired Memory Impairment: Decreased short term memory Decreased Short Term Memory: Verbal basic Awareness: Appears intact Problem Solving: Appears intact Executive Function: Reasoning Reasoning: Appears intact Safety/Judgment: Appears intact  Comprehension  Auditory Comprehension Overall Auditory Comprehension: Appears within functional limits for tasks assessed Commands: Within Functional  Limits Conversation: Complex Visual Recognition/Discrimination Discrimination: Not tested Reading Comprehension Reading Status: Not tested    Expression Expression Primary Mode of Expression: Verbal Verbal Expression Overall Verbal Expression: Appears within functional limits for tasks assessed Initiation: No impairment Repetition: No impairment Naming: No impairment Pragmatics: No impairment Written Expression Dominant Hand: Right Written Expression: Not tested   Oral / Motor  Motor Speech Overall Motor Speech: Appears within functional limits for tasks assessed Respiration: Within functional limits Phonation: Normal Resonance: Within functional limits Articulation: Within functional limitis Intelligibility: Intelligible Motor Planning: Witnin functional limits Motor Speech Errors: Not applicable   [place], [place], [place] Office: [phone] 08/25/2019, 11:07 AM

## 2019-08-25 NOTE — Progress Notes (Signed)
  Echocardiogram 2D Echocardiogram has been performed.  Jannett Celestine 08/25/2019, 11:27 AM

## 2019-08-25 NOTE — Progress Notes (Signed)
PT Cancellation Note  Patient Details Name: Jeanne Rogers MRN: [id number] DOB: 09/09/1940   Cancelled Treatment:    Reason Eval/Treat Not Completed: PT screened, no needs identified, will sign off   Ellamae Sia, [place], DPT Acute Rehabilitation Services Pager [phone] Office [phone]    Willy Eddy 08/25/2019, 4:38 PM

## 2019-08-25 NOTE — Progress Notes (Signed)
PROGRESS NOTE    Jeanne Rogers  [id number] DOB: [birth year] DOA: 08/24/2019 PCP: Mikey Kirschner, MD   Brief Narrative:  HPI per Dr. Jani Gravel on 08/24/2019  Jeanne Rogers  is a 79 y.o. female, w hyperlipidemia, apparently called her husband around 11am to help her walk to bathroom since she couldn't by herself, pt denies headache, vision change, slurred speech,  Cp, palp, sob, n/v, abd pain, diarrhea, brbpr, black stool dysuria, numbness, tingling, focal weakness.  Pt was seen by PCP and CT ordered but patients daughter in law sent her to [facility].   Took an aspirin 11:30 am this morning, no recent changes to medication.  Just takes fosamax.   Pt states that her symptoms of difficulty walking lasted about 30 minutes  In Ed,  T 97.5  P 101 R 18, Bp 169/97  Pox 100% on RA  CT brain  IMPRESSION: 1. Generalized cerebral atrophy. 2. No acute intracranial abnormality.  Na 141, K 5.3, Bun 17, Creatinne 1.03 Ast 22, Alt 13 Wbc 9.7 Hgb 13.9, Plt 392 INR 1.0  ED didn't consult neurology   Pt will be admitted for ataxia, ? TIA.    **Interim History Neurology was consulted for further evaluation and the recommendations are still pending.  We will start empiric treatment for suspected UTI with IV ceftriaxone and adjust as necessary.  We will obtain PT OT evaluations  Assessment & Plan:   Active Problems:   TIA (transient ischemic attack)  Ataxia, difficulty walking  ? TIA per ED -MRI brain done and showed "No acute intracranial abnormality. Moderate chronic small vessel disease." -Carotid ultrasound ordered by Admitting Physician  -Check Cardiac echo and done and pending read -Checked Hemoglobin A1c and was 5.2  -Lipid Panel showed total cholesterol HDL ratio 3.2, cholesterol level of 240, HDL 74, LDL 150, triglycerides of 82, VLDL 16 -Obtain PT/OT/ Speech consultations -Allow for permissive hypertension -Continue Aspirin  -Start Lipitor 80mg  po qhs -Consulted Neurology  for further Evaluation and management and appreciate their help   H/o PUD -Might not be able to take aspirin long term due to hx of PUD per husband ?  Osteoporosis -Hold Fosamax for now -Control with tramadol 50 mg every 12h as needed  HLD -Lipid Panel showed total cholesterol HDL ratio 3.2, cholesterol level of 240, HDL 74, LDL 150, triglycerides of 82, VLDL 16  Hyperglycemia  -Blood Sugar on Admission was 132 and repeat was 100 -HbA1c was 5.2 -Continue to Monitor Blood Sugars carefully and if necessary will need to place on Sensitive Novolog SSI  Suspected UTI -This done on admission showed a hazy appearance with large leukocytes, positive nitrites, many bacteria, 0-5 non-squamous epithelial cells, 0-5 RBCs per high-powered field, 0-5 squamous epithelial cells and greater than 50 WBCs -Urine culture obtained prior to starting antibiotics -Continue empiric ceftriaxone for now  Generalized Weakness -Obtain PT OT to evaluate and treat  DVT prophylaxis: SCDs; Will order Enoxaparin 40 mg sq q24h Code Status: FULL CODE Family Communication: Discussed with Husband at bedside  Disposition Plan: Patient from home with generalized weakness and suspected UTI.  Will need urine culture results as well as neurology recommendations prior to discharge given her questionable TIA.  Echocardiogram is still pending read and needs to be completed prior to discharge.  Consultants:   Neurology   Procedures:  ECHOCARDIOGRAM ordered and pending   Antimicrobials:  Anti-infectives (From admission, onward)   Start     Dose/Rate Route Frequency Ordered Stop  08/25/19 1530  cefTRIAXone (ROCEPHIN) 1 g in sodium chloride 0.9 % 100 mL IVPB     1 g 200 mL/hr over 30 Minutes Intravenous Every 24 hours 08/25/19 1454       Subjective: And examined at bedside and she feels that she was doing a little bit better.  Denies any chest pain, lightheadedness or dizziness.  No nausea or vomiting.  Not as weak  today but still very concerned and wanted to speak with neurology.  No other concerns or or complaints at this time.  Objective: Vitals:   08/25/19 0330 08/25/19 0415 08/25/19 0615 08/25/19 0750  BP: (!) 152/64 (!) 146/70 (!) 149/72 (!) 148/67  Pulse: 88 86 80 78  Resp: 18 20 18 20   Temp: 98.1 F (36.7 C) 98.1 F (36.7 C) 98.6 F (37 C) 97.8 F (36.6 C)  TempSrc: Oral Oral Oral Oral  SpO2:  100% 97% 97%  Weight: 55.1 kg     Height: 5\' 3"  (1.6 m)      No intake or output data in the 24 hours ending 08/25/19 0907 Filed Weights   08/25/19 0330  Weight: 55.1 kg   Examination: Physical Exam:  Constitutional: Thin elderly Caucasian female in NAD and appears calm  Eyes: Lids and conjunctivae normal, sclerae anicteric  ENMT: External Ears, Nose appear normal. Grossly normal hearing.  Neck: Appears normal, supple, no cervical masses, normal ROM, no appreciable thyromegaly; no JVD Respiratory: Diminished to auscultation bilaterally, no wheezing, rales, rhonchi or crackles. Normal respiratory effort and patient is not tachypenic. No accessory muscle use. Unlabored breathing  Cardiovascular: RRR, no murmurs / rubs / gallops. S1 and S2 auscultated. No extremity edema.  Abdomen: Soft, non-tender, non-distended. Bowel sounds positive x4.  GU: Deferred. Musculoskeletal: No clubbing / cyanosis of digits/nails. No joint deformity upper and lower extremities.   Skin: No rashes, lesions, ulcers on a limited skin evaluation. No induration; Warm and dry.  Neurologic: CN 2-12 grossly intact with no focal deficits. Romberg sign and cerebellar reflexes not assessed.  Psychiatric: Normal judgment and insight. Alert and oriented x 3. Normal mood and appropriate affect.   Data Reviewed: I have personally reviewed following labs and imaging studies  CBC: Recent Labs  Lab 08/24/19 1716  WBC 9.7  NEUTROABS 8.2*  HGB 13.9  HCT 44.4  MCV 92.1  PLT 0000000   Basic Metabolic Panel: Recent Labs  Lab  08/24/19 1716 08/25/19 0412  NA 141 142  K 5.3* 4.9  CL 103 105  CO2 27 28  GLUCOSE 132* 100*  BUN 17 16  CREATININE 1.03* 0.89  CALCIUM 9.5 9.1   GFR: Estimated Creatinine Clearance: 43.8 mL/min (by C-G formula based on SCr of 0.89 mg/dL). Liver Function Tests: Recent Labs  Lab 08/24/19 1716 08/25/19 0412  AST 22 18  ALT 13 13  ALKPHOS 51 49  BILITOT 0.6 0.4  PROT 7.1 6.4*  ALBUMIN 3.8 3.7   No results for input(s): LIPASE, AMYLASE in the last 168 hours. No results for input(s): AMMONIA in the last 168 hours. Coagulation Profile: Recent Labs  Lab 08/24/19 1716  INR 1.0   Cardiac Enzymes: No results for input(s): CKTOTAL, CKMB, CKMBINDEX, TROPONINI in the last 168 hours. BNP (last 3 results) No results for input(s): PROBNP in the last 8760 hours. HbA1C: No results for input(s): HGBA1C in the last 72 hours. CBG: Recent Labs  Lab 08/25/19 0029  GLUCAP 103*   Lipid Profile: Recent Labs    08/25/19 0412  CHOL  240*  HDL 74  LDLCALC 150*  TRIG 82  CHOLHDL 3.2   Thyroid Function Tests: No results for input(s): TSH, T4TOTAL, FREET4, T3FREE, THYROIDAB in the last 72 hours. Anemia Panel: No results for input(s): VITAMINB12, FOLATE, FERRITIN, TIBC, IRON, RETICCTPCT in the last 72 hours. Sepsis Labs: No results for input(s): PROCALCITON, LATICACIDVEN in the last 168 hours.  Recent Results (from the past 240 hour(s))  SARS CORONAVIRUS 2 (TAT 6-24 HRS) Nasopharyngeal Nasopharyngeal Swab     Status: None   Collection Time: 08/25/19 12:32 AM   Specimen: Nasopharyngeal Swab  Result Value Ref Range Status   SARS Coronavirus 2 NEGATIVE NEGATIVE Final    Comment: (NOTE) SARS-CoV-2 target nucleic acids are NOT DETECTED. The SARS-CoV-2 RNA is generally detectable in upper and lower respiratory specimens during the acute phase of infection. Negative results do not preclude SARS-CoV-2 infection, do not rule out co-infections with other pathogens, and should not be  used as the sole basis for treatment or other patient management decisions. Negative results must be combined with clinical observations, patient history, and epidemiological information. The expected result is Negative. Fact Sheet for Patients: SugarRoll.be Fact Sheet for Healthcare Providers: https://www.woods-mathews.com/ This test is not yet approved or cleared by the [place] FDA and  has been authorized for detection and/or diagnosis of SARS-CoV-2 by FDA under an Emergency Use Authorization (EUA). This EUA will remain  in effect (meaning this test can be used) for the duration of the COVID-19 declaration under Section 56 4(b)(1) of the Act, 21 U.S.C. section 360bbb-3(b)(1), unless the authorization is terminated or revoked sooner. Performed at [facility], [place] [address], [place], [place] [address]     Radiology Studies: DG Chest 2 View  Result Date: 08/25/2019 CLINICAL DATA:  Weakness. EXAM: CHEST - 2 VIEW COMPARISON:  05/18/2005 FINDINGS: The cardiomediastinal contours are normal. Aortic atherosclerosis. Retrocardiac hiatal hernia. Biapical pleuroparenchymal scarring. Minimal patchy opacity in the left lung base. Pulmonary vasculature is normal. No pleural effusion or pneumothorax. No acute osseous abnormalities are seen. Chronic compression fracture of T12, compared with 07/16/2017 lumbar spine radiograph. IMPRESSION: 1. Minimal patchy opacity in the left lung base, atelectasis versus pneumonia. 2. Retrocardiac hiatal hernia. Aortic Atherosclerosis (ICD10-I70.0). Electronically Signed   By: Keith Rake M.D.   On: 08/25/2019 01:11   CT HEAD WO CONTRAST  Result Date: 08/24/2019 CLINICAL DATA:  Focal neurological deficit. EXAM: CT HEAD WITHOUT CONTRAST TECHNIQUE: Contiguous axial images were obtained from the base of the skull through the vertex without intravenous contrast. COMPARISON:  February 20, 2009 FINDINGS: Brain: There is  mild cerebral atrophy with widening of the extra-axial spaces and ventricular dilatation. There are areas of decreased attenuation within the white matter tracts of the supratentorial brain, consistent with microvascular disease changes. Vascular: No hyperdense vessel or unexpected calcification. Skull: Normal. Negative for fracture or focal lesion. Sinuses/Orbits: No acute finding. Other: None. IMPRESSION: 1. Generalized cerebral atrophy. 2. No acute intracranial abnormality. Electronically Signed   By: Virgina Norfolk M.D.   On: 08/24/2019 21:52   MR BRAIN WO CONTRAST  Result Date: 08/25/2019 CLINICAL DATA:  Difficulty walking. EXAM: MRI HEAD WITHOUT CONTRAST TECHNIQUE: Multiplanar, multiecho pulse sequences of the brain and surrounding structures were obtained without intravenous contrast. COMPARISON:  None. FINDINGS: BRAIN: There is no acute infarct, acute hemorrhage or extra-axial collection. Early confluent hyperintense T2-weighted signal of the periventricular and deep white matter, most commonly due to chronic ischemic microangiopathy. The cerebral and cerebellar volume are age-appropriate. There is  no hydrocephalus. The midline structures are normal. VASCULAR: The major intracranial arterial and venous sinus flow voids are normal. Susceptibility-sensitive sequences show no chronic microhemorrhage or superficial siderosis. SKULL AND UPPER CERVICAL SPINE: Calvarial bone marrow signal is normal. There is no skull base mass. The visualized upper cervical spine and soft tissues are normal. SINUSES/ORBITS: There are no fluid levels or advanced mucosal thickening. Mild bilateral mastoid fluid. The orbits are normal. IMPRESSION: 1. No acute intracranial abnormality. 2. Moderate chronic small vessel disease. Electronically Signed   By: Ulyses Jarred M.D.   On: 08/25/2019 02:26   Scheduled Meds: . aspirin  300 mg Rectal Daily   Or  . aspirin  325 mg Oral Daily  . sodium chloride flush  3 mL Intravenous  Once   Continuous Infusions:   LOS: 0 days   Kerney Elbe, DO [facility] PAGER is on [place]  If 7PM-7AM, please contact night-coverage [email or web address]

## 2019-08-25 NOTE — Consult Note (Signed)
Neurology Consultation  Reason for Consult: Ataxia Referring Physician: Kerney Elbe, DO  CC: Nausea/weakness/fear of having a stroke  History is obtained from: Patient and husband  HPI: Jeanne Rogers is a 79 y.o. female with history of spontaneous pneumothorax, hyperlipidemia, complications of anesthesia and anemia.  Patient was not feeling well yesterday and thought she was having symptoms of stroke.  Patient was brought to the hospital for further evaluation.  Neurology was asked to consult.  Patient states that she was playing with her granddaughter yesterday and felt slightly tired so she laid down in the bed.  Upon getting up at approximately 11:00 patient "felt slightly nauseous, and slightly weak in her legs sitting at the bed ".  Due to the sensations, she became fearful she was having a stroke. (Her mother had a stroke in the past).  Patient called her husband to the bedside.  He had her stand up and walked her to the bathroom.  He did not need to give her any assistance and did not notice any staggering.  Due to fear of possible stroke she did call her primary care doctor.  When he eventually saw him, he recommended that they go to the hospital.  They live [facility] which per patient has a significant amount of Covid.  For that reason they came to [facility].  Currently patient is asymptomatic.  Patient denies any sensation of vertigo, difficulty with vision, difficulty with speaking and or understanding, one-sided weakness, numb sensation on one side or the other, listing and or staggering side to side when walking.  They did mention that she had symptoms similar to this about 5 years ago.  When asked if the symptoms were exactly the same they could not give me any definitive answer.  At this time patient is still very fearful she may have had a stroke.  She does state that she takes aspirin but not on a daily basis.  ED course  Relevant labs include : lipid panel  which showed LDL to be 150, hemoglobin A1c which is pending Urinalysis which showed positive for nitrites and leukocytes along with many bacteria.  Urine culture pending  CT head shows-generalized cerebral atrophy with no acute abnormality MRI brain-no acute intracranial abnormality.  Moderate chronic small vessel disease ECG showed normal sinus rhythm    LKW: 11:00 AM on 08/24/2019 tpa given?: no, out of window Premorbid modified Rankin scale (mRS): 0 NIH stroke scale: 0   Past Medical History:  Diagnosis Date  . Anemia   . Collapse, lung   . Complication of anesthesia    very sensitive to anesthesia  . Hyperlipidemia   . Osteoporosis   . PUD (peptic ulcer disease)   . Spontaneous pneumothorax     Family History  Problem Relation Age of Onset  . Hyperlipidemia Mother   . Hypertension Father    Social History:   reports that she has quit smoking. She quit smokeless tobacco use about 31 years ago. She reports that she does not drink alcohol or use drugs.  Medications  Current Facility-Administered Medications:  .  acetaminophen (TYLENOL) tablet 650 mg, 650 mg, Oral, Q4H PRN **OR** acetaminophen (TYLENOL) 160 MG/5ML solution 650 mg, 650 mg, Per Tube, Q4H PRN **OR** acetaminophen (TYLENOL) suppository 650 mg, 650 mg, Rectal, Q4H PRN, Jani Gravel, MD .  aspirin suppository 300 mg, 300 mg, Rectal, Daily **OR** aspirin tablet 325 mg, 325 mg, Oral, Daily, Jani Gravel, MD .  sodium chloride flush (NS)  0.9 % injection 3 mL, 3 mL, Intravenous, Once, Virgel Manifold, MD .  traMADol Veatrice Bourbon) tablet 50 mg, 50 mg, Oral, Q12H PRN, Jani Gravel, MD .  zolpidem [facility]) tablet 5 mg, 5 mg, Oral, QHS PRN, Jani Gravel, MD  ROS:    General ROS: negative for - chills, fatigue, fever, night sweats, weight gain or weight loss Psychological ROS: negative for - behavioral disorder, hallucinations, memory difficulties, mood swings or suicidal ideation Ophthalmic ROS: negative for - blurry vision,  double vision, eye pain or loss of vision ENT ROS: negative for - epistaxis, nasal discharge, oral lesions, sore throat, tinnitus or vertigo Allergy and Immunology ROS: negative for - hives or itchy/watery eyes Hematological and Lymphatic ROS: negative for - bleeding problems, bruising or swollen lymph nodes Endocrine ROS: negative for - galactorrhea, hair pattern changes, polydipsia/polyuria or temperature intolerance Respiratory ROS: negative for - cough, hemoptysis, shortness of breath or wheezing Cardiovascular ROS: negative for - chest pain, dyspnea on exertion, edema or irregular heartbeat Gastrointestinal ROS: Positive for -nausea Genito-Urinary ROS: negative for - dysuria, hematuria, incontinence or urinary frequency/urgency Musculoskeletal ROS: Positive for - muscular weakness Neurological ROS: as noted in HPI Dermatological ROS: negative for rash and skin lesion changes  Exam: Current vital signs: BP (!) 148/67 (BP Location: Right Arm)   Pulse 78   Temp 97.8 F (36.6 C) (Oral)   Resp 20   Ht 5\' 3"  (1.6 m)   Wt 55.1 kg   SpO2 97%   BMI 21.52 kg/m  Vital signs in last 24 hours: Temp:  [97.3 F (36.3 C)-98.6 F (37 C)] 97.8 F (36.6 C) (01/21 0750) Pulse Rate:  [77-107] 78 (01/21 0750) Resp:  [14-23] 20 (01/21 0750) BP: (122-180)/(64-98) 148/67 (01/21 0750) SpO2:  [95 %-100 %] 97 % (01/21 0750) Weight:  [55.1 kg-56.2 kg] 55.1 kg (01/21 0330)   Constitutional: Appears well-developed and well-nourished.  Psych: Anxious Eyes: No scleral injection HENT: No OP obstrucion Head: Normocephalic.  Cardiovascular: Normal rate and regular rhythm.  Respiratory: Effort normal, non-labored breathing GI: Soft.  No distension. There is no tenderness.  Skin: WDI  Neuro: Mental Status: Patient is awake, alert, oriented to person, place, month, year, and situation. Speech-no problems with naming, repeating, comprehension Patient gives a very vague history. Cranial Nerves: II:  Visual Fields are full.  III,IV, VI: EOMI without ptosis or diploplia. Pupils equal, round and reactive to light V: Facial sensation is symmetric to temperature VII: Facial movement is symmetric.  VIII: hearing is intact to voice X: Palat elevates symmetrically XI: Shoulder shrug is symmetric. XII: tongue is midline without atrophy or fasciculations.  Motor: Tone is normal. Bulk is normal. 5/5 strength was present in all four extremities.  Drift aterixis Sensory: Sensation is symmetric to light touch and temperature in the arms and legs.  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  Prick, temperature, light touch but intact vibratory sensation and proprioception Plantars: Toes are downgoing bilaterally.  Cerebellar: FNF and HKS are intact bilaterally Gait: No abnormalities with patient's gait  Labs I have reviewed labs in epic and the results pertinent to this consultation are:   CBC    Component Value Date/Time   WBC 9.7 08/24/2019 1716   RBC 4.82 08/24/2019 1716   HGB 13.9 08/24/2019 1716   HGB 13.9 11/13/2017 0812   HCT 44.4 08/24/2019 1716   HCT 43.1 11/13/2017 0812   PLT 392  08/24/2019 1716   PLT 377 11/13/2017 0812   MCV 92.1 08/24/2019 1716   MCV 91 11/13/2017 0812   MCH 28.8 08/24/2019 1716   MCHC 31.3 08/24/2019 1716   RDW 13.9 08/24/2019 1716   RDW 14.5 11/13/2017 0812   LYMPHSABS 1.0 08/24/2019 1716   LYMPHSABS 1.5 11/13/2017 0812   MONOABS 0.5 08/24/2019 1716   EOSABS 0.0 08/24/2019 1716   EOSABS 0.2 11/13/2017 0812   BASOSABS 0.1 08/24/2019 1716   BASOSABS 0.1 11/13/2017 0812    CMP     Component Value Date/Time   NA 142 08/25/2019 0412   NA 142 11/13/2017 0812   K 4.9 08/25/2019 0412   CL 105 08/25/2019 0412   CO2 28 08/25/2019 0412   GLUCOSE 100 (H) 08/25/2019 0412   BUN 16 08/25/2019 0412   BUN 14 11/13/2017 0812   CREATININE 0.89  08/25/2019 0412   CREATININE 0.92 07/07/2013 0743   CALCIUM 9.1 08/25/2019 0412   PROT 6.4 (L) 08/25/2019 0412   PROT 6.4 11/13/2017 0812   ALBUMIN 3.7 08/25/2019 0412   ALBUMIN 3.9 11/13/2017 0812   AST 18 08/25/2019 0412   ALT 13 08/25/2019 0412   ALKPHOS 49 08/25/2019 0412   BILITOT 0.4 08/25/2019 0412   BILITOT 0.3 11/13/2017 0812   GFRNONAA >60 08/25/2019 0412   GFRAA >60 08/25/2019 0412    Lipid Panel     Component Value Date/Time   CHOL 240 (H) 08/25/2019 0412   CHOL 253 (H) 11/13/2017 0812   TRIG 82 08/25/2019 0412   HDL 74 08/25/2019 0412   HDL 84 11/13/2017 0812   CHOLHDL 3.2 08/25/2019 0412   VLDL 16 08/25/2019 0412   LDLCALC 150 (H) 08/25/2019 0412   LDLCALC 157 (H) 11/13/2017 0812   Urinalysis shows both leukocytes, nitrites and multiple bacteria  Imaging I have reviewed the images obtained:  CT-scan of the brain-no acute intracranial abnormalities  MRI examination of the brain-no acute intracranial abnormalities  Etta Quill PA-C [facility] [phone]  M-F  (9:00 am- 5:00 PM)  08/25/2019, 10:01 AM   I have seen the patient reviewed the above note.  What she describes was not focal dysfunction, but rather general foggy headed feeling and sense of malaise.  Assessment:  This is a 79 year old female with stroke risk factor of hyperlipidemia.  Patient has a very vague history of feeling different and slightly weak yesterday upon sitting up.  Exam does not show any localizing or lateralizing symptoms and gait is normal.  MRI does not show any acute stroke, ECG was normal, CT did not show any acute abnormalities.  Patient's urinalysis however does show many bacteria, increased leukocytes and positive nitrites.  At this point I believe her symptoms are related to UTI as she shows no acute stroke findings.    She does have significant chronic ischemic disease on her MRI, and I think a baby aspirin daily would not be  unreasonable.   Impression: -Likely UTI, awaiting urine culture  Recommend #Transthoracic Echo-completed # Start patient on ASA 81mg  daily #Lipid management per internal medicine # BP goal:normotensive # HBAIC-ordered  # Telemetry monitoring # Frequent neuro checks # NPO until passes stroke swallow screen #Neurology will be available on an as-needed basis  Roland Rack, MD [facility] [phone]  If 7pm- 7am, please page neurology on call as listed in [place].

## 2019-08-25 NOTE — Evaluation (Signed)
Occupational Therapy Evaluation and Discharge Patient Details Name: Jeanne Rogers MRN: [id number] DOB: 29-Aug-1940 Today's Date: 08/25/2019    History of Present Illness Jeanne Rogers  is a 79 y.o. female, w hyperlipidemia, apparently called her husband around 11am to help her walk to bathroom since she couldn't by herself, pt denies headache, vision change, slurred speech,  Cp, palp, sob, n/v, abd pain, diarrhea, brbpr, black stool dysuria, numbness, tingling, focal weakness. MRI with no acute findings   Clinical Impression   Pt independent at baseline. Drives. Today Pt was able to demonstrate ADL, transfers, and in-room mobility at mod I level (min guard for safety but no LOB, no SOB, no dizziness with positional changes). Strength, vision WFL. Pt able to demonstrate LB dressing, standing balance at sink for multiple grooming tasks (able to find items in crowded bin and open all containers), answer questions for orientation and cognition. Pt at baseline and independent. Edcuation on signs and symptoms of stroke complete. No further OT needs. OT to sign off at this time.     Follow Up Recommendations  No OT follow up    Equipment Recommendations  None recommended by OT    Recommendations for Other Services       Precautions / Restrictions Precautions Precautions: None Restrictions Weight Bearing Restrictions: No      Mobility Bed Mobility Overal bed mobility: Independent                Transfers Overall transfer level: Modified independent               General transfer comment: min guard for safety, no LOB, no sway    Balance Overall balance assessment: Independent                                         ADL either performed or assessed with clinical judgement   ADL Overall ADL's : Modified independent                                             Vision Baseline Vision/History: Wears glasses Wears Glasses: At all  times Patient Visual Report: No change from baseline Vision Assessment?: No apparent visual deficits     Perception     Praxis      Pertinent Vitals/Pain Pain Assessment: No/denies pain     Hand Dominance     Extremity/Trunk Assessment Upper Extremity Assessment Upper Extremity Assessment: Overall WFL for tasks assessed   Lower Extremity Assessment Lower Extremity Assessment: Overall WFL for tasks assessed       Communication Communication Communication: No difficulties   Cognition Arousal/Alertness: Awake/alert Behavior During Therapy: WFL for tasks assessed/performed Overall Cognitive Status: Within Functional Limits for tasks assessed                                     General Comments  husband present throughout session    Exercises     Shoulder Instructions      Home Living Family/patient expects to be discharged to:: Private residence Living Arrangements: Spouse/significant other Available Help at Discharge: Family;Available 24 hours/day Type of Home: House Home Access: Stairs to enter [employer] of Steps: 3  Home Layout: One level     Bathroom Shower/Tub: Teacher, early years/pre: Standard         Additional Comments: still drives,       Prior Functioning/Environment Level of Independence: Independent                 OT Problem List: Impaired balance (sitting and/or standing)      OT Treatment/Interventions:      OT Goals(Current goals can be found in the care plan section) Acute Rehab OT Goals Patient Stated Goal: prevent strokes in the future OT Goal Formulation: With patient/family Time For Goal Achievement: 09/08/19 Potential to Achieve Goals: Good  OT Frequency:     Barriers to D/C:            Co-evaluation              AM-PAC OT "6 Clicks" Daily Activity     Outcome Measure Help from another person eating meals?: None Help from another person taking care of personal  grooming?: None Help from another person toileting, which includes using toliet, bedpan, or urinal?: None Help from another person bathing (including washing, rinsing, drying)?: None Help from another person to put on and taking off regular upper body clothing?: None Help from another person to put on and taking off regular lower body clothing?: None 6 Click Score: 24   End of Session Nurse Communication: Mobility status  Activity Tolerance: Patient tolerated treatment well Patient left: in bed;with call bell/phone within reach;with family/visitor present  OT Visit Diagnosis: Unsteadiness on feet (R26.81)                Time: [id number] OT Time Calculation (min): 21 min Charges:  OT General Charges $OT Visit: 1 Visit OT Evaluation $OT Eval Low Complexity: [place] OTR/L Acute Rehabilitation Services Pager: [phone] Office: [place] 08/25/2019, 10:12 AM

## 2019-08-26 ENCOUNTER — Telehealth: Payer: Self-pay | Admitting: Family Medicine

## 2019-08-26 DIAGNOSIS — M810 Age-related osteoporosis without current pathological fracture: Secondary | ICD-10-CM | POA: Diagnosis present

## 2019-08-26 DIAGNOSIS — Z87891 Personal history of nicotine dependence: Secondary | ICD-10-CM | POA: Diagnosis not present

## 2019-08-26 DIAGNOSIS — E785 Hyperlipidemia, unspecified: Secondary | ICD-10-CM

## 2019-08-26 DIAGNOSIS — I6782 Cerebral ischemia: Secondary | ICD-10-CM | POA: Diagnosis present

## 2019-08-26 DIAGNOSIS — R531 Weakness: Secondary | ICD-10-CM | POA: Diagnosis present

## 2019-08-26 DIAGNOSIS — R739 Hyperglycemia, unspecified: Secondary | ICD-10-CM | POA: Diagnosis present

## 2019-08-26 DIAGNOSIS — Z888 Allergy status to other drugs, medicaments and biological substances status: Secondary | ICD-10-CM | POA: Diagnosis not present

## 2019-08-26 DIAGNOSIS — E860 Dehydration: Secondary | ICD-10-CM | POA: Diagnosis present

## 2019-08-26 DIAGNOSIS — Z20822 Contact with and (suspected) exposure to covid-19: Secondary | ICD-10-CM | POA: Diagnosis present

## 2019-08-26 DIAGNOSIS — Z8709 Personal history of other diseases of the respiratory system: Secondary | ICD-10-CM | POA: Diagnosis not present

## 2019-08-26 DIAGNOSIS — Z8349 Family history of other endocrine, nutritional and metabolic diseases: Secondary | ICD-10-CM | POA: Diagnosis not present

## 2019-08-26 DIAGNOSIS — G629 Polyneuropathy, unspecified: Secondary | ICD-10-CM | POA: Diagnosis present

## 2019-08-26 DIAGNOSIS — Z8249 Family history of ischemic heart disease and other diseases of the circulatory system: Secondary | ICD-10-CM | POA: Diagnosis not present

## 2019-08-26 DIAGNOSIS — Z90722 Acquired absence of ovaries, bilateral: Secondary | ICD-10-CM | POA: Diagnosis not present

## 2019-08-26 DIAGNOSIS — B9620 Unspecified Escherichia coli [E. coli] as the cause of diseases classified elsewhere: Secondary | ICD-10-CM | POA: Diagnosis present

## 2019-08-26 DIAGNOSIS — R29700 NIHSS score 0: Secondary | ICD-10-CM | POA: Diagnosis present

## 2019-08-26 DIAGNOSIS — Z7983 Long term (current) use of bisphosphonates: Secondary | ICD-10-CM | POA: Diagnosis not present

## 2019-08-26 DIAGNOSIS — Z8711 Personal history of peptic ulcer disease: Secondary | ICD-10-CM | POA: Diagnosis not present

## 2019-08-26 DIAGNOSIS — Z79899 Other long term (current) drug therapy: Secondary | ICD-10-CM | POA: Diagnosis not present

## 2019-08-26 DIAGNOSIS — Z79891 Long term (current) use of opiate analgesic: Secondary | ICD-10-CM | POA: Diagnosis not present

## 2019-08-26 DIAGNOSIS — N390 Urinary tract infection, site not specified: Secondary | ICD-10-CM | POA: Diagnosis present

## 2019-08-26 DIAGNOSIS — R260 Ataxic gait: Secondary | ICD-10-CM | POA: Diagnosis present

## 2019-08-26 DIAGNOSIS — Z823 Family history of stroke: Secondary | ICD-10-CM | POA: Diagnosis not present

## 2019-08-26 LAB — COMPREHENSIVE METABOLIC PANEL
ALT: 13 U/L (ref 0–44)
AST: 22 U/L (ref 15–41)
Albumin: 3.5 g/dL (ref 3.5–5.0)
Alkaline Phosphatase: 41 U/L (ref 38–126)
Anion gap: 10 (ref 5–15)
BUN: 13 mg/dL (ref 8–23)
CO2: 27 mmol/L (ref 22–32)
Calcium: 9 mg/dL (ref 8.9–10.3)
Chloride: 104 mmol/L (ref 98–111)
Creatinine, Ser: 0.93 mg/dL (ref 0.44–1.00)
GFR calc Af Amer: >60 mL/min (ref >60)
GFR calc non Af Amer: 59 mL/min — ABNORMAL LOW (ref >60)
Glucose, Bld: 90 mg/dL (ref 70–99)
Potassium: 4.5 mmol/L (ref 3.5–5.1)
Sodium: 141 mmol/L (ref 135–145)
Total Bilirubin: 0.9 mg/dL (ref 0.3–1.2)
Total Protein: 6 g/dL — ABNORMAL LOW (ref 6.5–8.1)

## 2019-08-26 LAB — CBC WITH DIFFERENTIAL/PLATELET
Abs Immature Granulocytes: 0.01 10*3/uL (ref 0.00–0.07)
Basophils Absolute: 0.1 10*3/uL (ref 0.0–0.1)
Basophils Relative: 1 %
Eosinophils Absolute: 0.1 10*3/uL (ref 0.0–0.5)
Eosinophils Relative: 2 %
HCT: 42.4 % (ref 36.0–46.0)
Hemoglobin: 13.7 g/dL (ref 12.0–15.0)
Immature Granulocytes: 0 %
Lymphocytes Relative: 24 %
Lymphs Abs: 1.6 10*3/uL (ref 0.7–4.0)
MCH: 29.1 pg (ref 26.0–34.0)
MCHC: 32.3 g/dL (ref 30.0–36.0)
MCV: 90.2 fL (ref 80.0–100.0)
Monocytes Absolute: 0.7 10*3/uL (ref 0.1–1.0)
Monocytes Relative: 10 %
Neutro Abs: 4.1 10*3/uL (ref 1.7–7.7)
Neutrophils Relative %: 63 %
Platelets: 335 10*3/uL (ref 150–400)
RBC: 4.7 MIL/uL (ref 3.87–5.11)
RDW: 14.1 % (ref 11.5–15.5)
WBC: 6.6 10*3/uL (ref 4.0–10.5)
nRBC: 0 % (ref 0.0–0.2)

## 2019-08-26 LAB — MAGNESIUM: Magnesium: 2.1 mg/dL (ref 1.7–2.4)

## 2019-08-26 LAB — PHOSPHORUS: Phosphorus: 3.6 mg/dL (ref 2.5–4.6)

## 2019-08-26 MED ORDER — CEPHALEXIN 500 MG PO CAPS: 500.0000 mg | ORAL_CAPSULE | Freq: Three times a day (TID) | ORAL | 0 refills | Status: DC

## 2019-08-26 NOTE — Discharge Summary (Signed)
Physician Discharge Summary  Jeanne Rogers [id number] DOB: Feb 16, 1941 DOA: 08/24/2019  PCP: Mikey Kirschner, MD  Admit date: 08/24/2019 Discharge date: 08/26/2019  Admitted From: Home Disposition: Home  Recommendations for Outpatient Follow-up:  1. Follow up with PCP in 1-2 weeks 2. Please obtain BMP/CBC in one week 3. Follow-up with primary care physician in the next 1 week.  Discuss starting aspirin regimen 4. May also benefit from further evaluation for dementia.  Home Health: Home health RN, social work Equipment/Devices:  Discharge Condition: Stable CODE STATUS: Full code Diet recommendation: Heart healthy  Brief/Interim Summary: 79 year old female with a history of hyperlipidemia, was brought to the hospital with ataxia, generalized weakness and concern for possible stroke symptoms.  She did feel weak in her legs while sitting on the bed.  She also felt nauseous.  MRI imaging did not show any acute abnormalities.  She was seen by neurology who did not feel that she had a TIA/stroke.  It was noted that she had a urinary tract infection which was likely the cause of her symptoms.  She is also mildly dehydrated which improved with IV hydration.  Her urine cultures positive for E. coli and she has been transitioned to Keflex.  She has been afebrile and overall she is feeling better.  No further work-up in the hospital was recommended.  Neurology did recommend a baby aspirin daily, but patient reports having issues with GI bleeding/gastric upset with taking aspirin in the past.  Will recommend she discuss this further with her primary care physician.  After discussing her care with her daughter, her daughters expressed concerns that patient may not be taking her medications appropriately at home.  Home health RN and social worker have been set up to assess patient as needed at home.  Patient is otherwise stable for discharge today.  Discharge Diagnoses:  Active Problems:    Osteoporosis   UTI (urinary tract infection)   Hyperlipidemia   Generalized weakness    Discharge Instructions  Discharge Instructions    Diet - low sodium heart healthy   Complete by: As directed    Increase activity slowly   Complete by: As directed      Allergies as of 08/26/2019      Reactions   Methergine [methylergonovine Maleate] Other (See Comments)   Unknown      Medication List    TAKE these medications   alendronate 70 MG tablet Commonly known as: FOSAMAX TAKE 1 TABLET BY MOUTH EVERY WEEK What changed: when to take this   cephALEXin 500 MG capsule Commonly known as: KEFLEX Take 1 capsule (500 mg total) by mouth 3 (three) times daily for 3 days.   multivitamin tablet Take 1 tablet by mouth daily.   traMADol 50 MG tablet Commonly known as: ULTRAM TAKE 1 TABLET BY MOUTH TWICE DAILY What changed:   when to take this  reasons to take this   zolpidem 10 MG tablet Commonly known as: AMBIEN TAKE 1 TABLET BY MOUTH EVERY DAY AT BEDTIME AS NEEDED FOR SLEEP What changed: See the new instructions.      Follow-up Information    Mikey Kirschner, MD. Schedule an appointment as soon as possible for a visit in 2 week(s).   Specialty: Family Medicine Contact information: [place] [address] [phone]        [facility], [facility] Follow up.   Specialty: Home Health Services Why: The home health agency will contact you for  the first home visit. Contact information: [place] [address] [phone]          Allergies  Allergen Reactions  . Methergine [Methylergonovine Maleate] Other (See Comments)    Unknown    Consultations:  Neurology   Procedures/Studies: DG Chest 2 View  Result Date: 08/25/2019 CLINICAL DATA:  Weakness. EXAM: CHEST - 2 VIEW COMPARISON:  05/18/2005 FINDINGS: The cardiomediastinal contours are normal. Aortic atherosclerosis. Retrocardiac hiatal hernia.  Biapical pleuroparenchymal scarring. Minimal patchy opacity in the left lung base. Pulmonary vasculature is normal. No pleural effusion or pneumothorax. No acute osseous abnormalities are seen. Chronic compression fracture of T12, compared with 07/16/2017 lumbar spine radiograph. IMPRESSION: 1. Minimal patchy opacity in the left lung base, atelectasis versus pneumonia. 2. Retrocardiac hiatal hernia. Aortic Atherosclerosis (ICD10-I70.0). Electronically Signed   By: Keith Rake M.D.   On: 08/25/2019 01:11   CT HEAD WO CONTRAST  Result Date: 08/24/2019 CLINICAL DATA:  Focal neurological deficit. EXAM: CT HEAD WITHOUT CONTRAST TECHNIQUE: Contiguous axial images were obtained from the base of the skull through the vertex without intravenous contrast. COMPARISON:  February 20, 2009 FINDINGS: Brain: There is mild cerebral atrophy with widening of the extra-axial spaces and ventricular dilatation. There are areas of decreased attenuation within the white matter tracts of the supratentorial brain, consistent with microvascular disease changes. Vascular: No hyperdense vessel or unexpected calcification. Skull: Normal. Negative for fracture or focal lesion. Sinuses/Orbits: No acute finding. Other: None. IMPRESSION: 1. Generalized cerebral atrophy. 2. No acute intracranial abnormality. Electronically Signed   By: Virgina Norfolk M.D.   On: 08/24/2019 21:52   MR BRAIN WO CONTRAST  Result Date: 08/25/2019 CLINICAL DATA:  Difficulty walking. EXAM: MRI HEAD WITHOUT CONTRAST TECHNIQUE: Multiplanar, multiecho pulse sequences of the brain and surrounding structures were obtained without intravenous contrast. COMPARISON:  None. FINDINGS: BRAIN: There is no acute infarct, acute hemorrhage or extra-axial collection. Early confluent hyperintense T2-weighted signal of the periventricular and deep white matter, most commonly due to chronic ischemic microangiopathy. The cerebral and cerebellar volume are age-appropriate. There is  no hydrocephalus. The midline structures are normal. VASCULAR: The major intracranial arterial and venous sinus flow voids are normal. Susceptibility-sensitive sequences show no chronic microhemorrhage or superficial siderosis. SKULL AND UPPER CERVICAL SPINE: Calvarial bone marrow signal is normal. There is no skull base mass. The visualized upper cervical spine and soft tissues are normal. SINUSES/ORBITS: There are no fluid levels or advanced mucosal thickening. Mild bilateral mastoid fluid. The orbits are normal. IMPRESSION: 1. No acute intracranial abnormality. 2. Moderate chronic small vessel disease. Electronically Signed   By: Ulyses Jarred M.D.   On: 08/25/2019 02:26   ECHOCARDIOGRAM COMPLETE  Result Date: 08/25/2019   ECHOCARDIOGRAM REPORT   Patient Name:   CARSHENA SHAIKH Robar Date of Exam: 08/25/2019 Medical Rec #:  [id number]         Height:       63.0 in Accession #:    [id number]        Weight:       121.5 lb Date of Birth:  [birth year]         BSA:          1.56 m Patient Age:    41 years          BP:           148/67 mmHg Patient Gender: F                 HR:  78 bpm. Exam Location:  Inpatient Procedure: 2D Echo Indications:    435.9 TIA  History:        Patient has no prior history of Echocardiogram examinations.                 Risk Factors:Dyslipidemia and Former Smoker.  Sonographer:    Jannett Celestine RDCS (AE) Referring Phys: Walker  1. Left ventricular ejection fraction, by visual estimation, is 55 to 60%. The left ventricle has normal function. There is mildly increased left ventricular hypertrophy.  2. Left ventricular diastolic parameters are consistent with Grade I diastolic dysfunction (impaired relaxation).  3. The left ventricle has no regional wall motion abnormalities.  4. Global right ventricle has normal systolic function.The right ventricular size is normal. No increase in right ventricular wall thickness.  5. Left atrial size was normal.  6. Right atrial  size was normal.  7. Mild mitral annular calcification.  8. The mitral valve is normal in structure. No evidence of mitral valve regurgitation. No evidence of mitral stenosis.  9. The tricuspid valve is normal in structure. Tricuspid valve regurgitation is trivial. 10. The aortic valve was not well visualized. Aortic valve regurgitation is not visualized. No evidence of aortic valve sclerosis or stenosis. 11. The tricuspid regurgitant velocity is 2.06 m/s, and with an assumed right atrial pressure of 3 mmHg, the estimated right ventricular systolic pressure is normal at 20.0 mmHg. 12. The inferior vena cava is normal in size with greater than 50% respiratory variability, suggesting right atrial pressure of 3 mmHg. FINDINGS  Left Ventricle: Left ventricular ejection fraction, by visual estimation, is 55 to 60%. The left ventricle has normal function. The left ventricle has no regional wall motion abnormalities. The left ventricular internal cavity size was the left ventricle is normal in size. There is mildly increased left ventricular hypertrophy. Left ventricular diastolic parameters are consistent with Grade I diastolic dysfunction (impaired relaxation). Right Ventricle: The right ventricular size is normal. No increase in right ventricular wall thickness. Global RV systolic function is has normal systolic function. The tricuspid regurgitant velocity is 2.06 m/s, and with an assumed right atrial pressure  of 3 mmHg, the estimated right ventricular systolic pressure is normal at 20.0 mmHg. Left Atrium: Left atrial size was normal in size. Right Atrium: Right atrial size was normal in size Pericardium: There is no evidence of pericardial effusion. Mitral Valve: The mitral valve is normal in structure. Mild mitral annular calcification. No evidence of mitral valve regurgitation. No evidence of mitral valve stenosis by observation. Tricuspid Valve: The tricuspid valve is normal in structure. Tricuspid valve  regurgitation is trivial. Aortic Valve: The aortic valve was not well visualized. Aortic valve regurgitation is not visualized. The aortic valve is structurally normal, with no evidence of sclerosis or stenosis. Pulmonic Valve: The pulmonic valve was normal in structure. Pulmonic valve regurgitation is trivial. Pulmonic regurgitation is trivial. Aorta: The aortic root is normal in size and structure. Venous: The inferior vena cava is normal in size with greater than 50% respiratory variability, suggesting right atrial pressure of 3 mmHg. IAS/Shunts: No atrial level shunt detected by color flow Doppler.  LEFT VENTRICLE PLAX 2D LVIDd:         2.40 cm  Diastology LVIDs:         1.60 cm  LV e' lateral:   7.94 cm/s LV PW:         0.90 cm  LV E/e' lateral: 6.9 LV IVS:  1.40 cm  LV e' medial:    4.24 cm/s LVOT diam:     2.00 cm  LV E/e' medial:  12.9 LV SV:         13 ml LV SV Index:   8.29 LVOT Area:     3.14 cm  LEFT ATRIUM             Index LA diam:        2.70 cm 1.73 cm/m LA Vol (A2C):   28.5 ml 18.22 ml/m LA Vol (A4C):   26.4 ml 16.88 ml/m LA Biplane Vol: 29.4 ml 18.79 ml/m  AORTIC VALVE LVOT Vmax:   65.80 cm/s LVOT Vmean:  49.200 cm/s LVOT VTI:    0.137 m  AORTA Ao Root diam: 2.50 cm MITRAL VALVE                        TRICUSPID VALVE MV Area (PHT): 2.07 cm             TR Peak grad:   17.0 mmHg MV PHT:        106.43 msec          TR Vmax:        206.00 cm/s MV Decel Time: 367 msec MV E velocity: 54.70 cm/s 103 cm/s  SHUNTS MV A velocity: 82.60 cm/s 70.3 cm/s Systemic VTI:  0.14 m MV E/A ratio:  0.66       1.5       Systemic Diam: 2.00 cm  Loralie Champagne MD Electronically signed by Loralie Champagne MD Signature Date/Time: 08/25/2019/4:50:00 PM    Final       Subjective: Feeling better.  No reported confusion.  No weakness.  Discharge Exam: Vitals:   08/26/19 0349 08/26/19 0804 08/26/19 1152 08/26/19 1557  BP: 140/70 139/67 (!) 152/55 (!) 147/68  Pulse: 80 78 78 84  Resp: 20 20 20 20   Temp: 98.3  F (36.8 C) (!) 97.5 F (36.4 C) (!) 97.3 F (36.3 C) (!) 97.5 F (36.4 C)  TempSrc: Oral Oral Oral Oral  SpO2: 95% 95% 95% 98%  Weight:      Height:        General: Pt is alert, awake, not in acute distress Cardiovascular: RRR, S1/S2 +, no rubs, no gallops Respiratory: CTA bilaterally, no wheezing, no rhonchi Abdominal: Soft, NT, ND, bowel sounds + Extremities: no edema, no cyanosis    The results of significant diagnostics from this hospitalization (including imaging, microbiology, ancillary and laboratory) are listed below for reference.     Microbiology: Recent Results (from the past 240 hour(s))  SARS CORONAVIRUS 2 (TAT 6-24 HRS) Nasopharyngeal Nasopharyngeal Swab     Status: None   Collection Time: 08/25/19 12:32 AM   Specimen: Nasopharyngeal Swab  Result Value Ref Range Status   SARS Coronavirus 2 NEGATIVE NEGATIVE Final    Comment: (NOTE) SARS-CoV-2 target nucleic acids are NOT DETECTED. The SARS-CoV-2 RNA is generally detectable in upper and lower respiratory specimens during the acute phase of infection. Negative results do not preclude SARS-CoV-2 infection, do not rule out co-infections with other pathogens, and should not be used as the sole basis for treatment or other patient management decisions. Negative results must be combined with clinical observations, patient history, and epidemiological information. The expected result is Negative. Fact Sheet for Patients: SugarRoll.be Fact Sheet for Healthcare Providers: https://www.woods-mathews.com/ This test is not yet approved or cleared by the [place] and  has been authorized  for detection and/or diagnosis of SARS-CoV-2 by FDA under an Emergency Use Authorization (EUA). This EUA will remain  in effect (meaning this test can be used) for the duration of the COVID-19 declaration under Section 56 4(b)(1) of the Act, 21 U.S.C. section 360bbb-3(b)(1), unless the  authorization is terminated or revoked sooner. Performed at [facility], [place] [address], [place], [place] [address]   Culture, Urine     Status: Abnormal (Preliminary result)   Collection Time: 08/25/19  3:37 PM   Specimen: Urine, Random  Result Value Ref Range Status   Specimen Description URINE, RANDOM  Final   Special Requests NONE  Final   Culture (A)  Final    >=100,000 COLONIES/mL ESCHERICHIA COLI SUSCEPTIBILITIES TO FOLLOW Performed at [facility], [place] [address] [place], [place] [address]    Report Status PENDING  Incomplete     Labs: BNP (last 3 results) No results for input(s): BNP in the last 8760 hours. Basic Metabolic Panel: Recent Labs  Lab 08/24/19 1716 08/25/19 0412 08/26/19 0407  NA 141 142 141  K 5.3* 4.9 4.5  CL 103 105 104  CO2 27 28 27   GLUCOSE 132* 100* 90  BUN 17 16 13   CREATININE 1.03* 0.89 0.93  CALCIUM 9.5 9.1 9.0  MG  --   --  2.1  PHOS  --   --  3.6   Liver Function Tests: Recent Labs  Lab 08/24/19 1716 08/25/19 0412 08/26/19 0407  AST 22 18 22   ALT 13 13 13   ALKPHOS 51 49 41  BILITOT 0.6 0.4 0.9  PROT 7.1 6.4* 6.0*  ALBUMIN 3.8 3.7 3.5   No results for input(s): LIPASE, AMYLASE in the last 168 hours. No results for input(s): AMMONIA in the last 168 hours. CBC: Recent Labs  Lab 08/24/19 1716 08/25/19 0412 08/25/19 1137 08/26/19 0407  WBC 9.7 8.1 8.1 6.6  NEUTROABS 8.2*  --  6.1 4.1  HGB 13.9 13.5 13.7 13.7  HCT 44.4 41.0 42.9 42.4  MCV 92.1 90.3 91.7 90.2  PLT 392 335 365 335   Cardiac Enzymes: No results for input(s): CKTOTAL, CKMB, CKMBINDEX, TROPONINI in the last 168 hours. BNP: Invalid input(s): POCBNP CBG: Recent Labs  Lab 08/25/19 0029  GLUCAP 103*   D-Dimer No results for input(s): DDIMER in the last 72 hours. Hgb A1c Recent Labs    08/25/19 0412  HGBA1C 5.2   Lipid Profile Recent Labs    08/25/19 0412  CHOL 240*  HDL 74  LDLCALC 150*  TRIG 82  CHOLHDL 3.2   Thyroid  function studies Recent Labs    08/25/19 1137  TSH 2.521   Anemia work up No results for input(s): VITAMINB12, FOLATE, FERRITIN, TIBC, IRON, RETICCTPCT in the last 72 hours. Urinalysis    Component Value Date/Time   COLORURINE YELLOW 08/25/2019 0022   APPEARANCEUR HAZY (A) 08/25/2019 0022   LABSPEC 1.006 08/25/2019 0022   PHURINE 6.0 08/25/2019 0022   GLUCOSEU NEGATIVE 08/25/2019 0022   HGBUR NEGATIVE 08/25/2019 0022   BILIRUBINUR NEGATIVE 08/25/2019 0022   KETONESUR NEGATIVE 08/25/2019 0022   PROTEINUR NEGATIVE 08/25/2019 0022   NITRITE POSITIVE (A) 08/25/2019 0022   LEUKOCYTESUR LARGE (A) 08/25/2019 0022   Sepsis Labs Invalid input(s): PROCALCITONIN,  WBC,  LACTICIDVEN Microbiology Recent Results (from the past 240 hour(s))  SARS CORONAVIRUS 2 (TAT 6-24 HRS) Nasopharyngeal Nasopharyngeal Swab     Status: None   Collection Time: 08/25/19 12:32 AM   Specimen: Nasopharyngeal Swab  Result Value Ref Range Status   SARS Coronavirus 2 NEGATIVE NEGATIVE Final    Comment: (NOTE) SARS-CoV-2 target nucleic acids are NOT DETECTED. The SARS-CoV-2 RNA is generally detectable in upper and lower respiratory specimens during the acute phase of infection. Negative results do not preclude SARS-CoV-2 infection, do not rule out co-infections with other pathogens, and should not be used as the sole basis for treatment or other patient management decisions. Negative results must be combined with clinical observations, patient history, and epidemiological information. The expected result is Negative. Fact Sheet for Patients: SugarRoll.be Fact Sheet for Healthcare Providers: https://www.woods-mathews.com/ This test is not yet approved or cleared by the [place] FDA and  has been authorized for detection and/or diagnosis of SARS-CoV-2 by FDA under an Emergency Use Authorization (EUA). This EUA will remain  in effect (meaning this test can be used)  for the duration of the COVID-19 declaration under Section 56 4(b)(1) of the Act, 21 U.S.C. section 360bbb-3(b)(1), unless the authorization is terminated or revoked sooner. Performed at [facility], [place] [address], [place], [place] [address]   Culture, Urine     Status: Abnormal (Preliminary result)   Collection Time: 08/25/19  3:37 PM   Specimen: Urine, Random  Result Value Ref Range Status   Specimen Description URINE, RANDOM  Final   Special Requests NONE  Final   Culture (A)  Final    >=100,000 COLONIES/mL ESCHERICHIA COLI SUSCEPTIBILITIES TO FOLLOW Performed at [facility], [place] [address], [place], [place] [address]    Report Status PENDING  Incomplete     Time coordinating discharge: 60mins  SIGNED:   Kathie Dike, MD  [facility] 08/26/2019, 10:00 PM   If 7PM-7AM, please contact night-coverage [email or web address]

## 2019-08-26 NOTE — TOC Initial Note (Signed)
Transition of Care [facility]) - Initial/Assessment Note    Patient Details  Name: Jeanne Rogers MRN: [id number] Date of Birth: 10/08/1940  [facility] CM/SW Contact:    Pollie Friar, RN Phone Number: 08/26/2019, 11:23 AM  Clinical Narrative:                 Pt from home with spouse that can provide 24 hour supervision as needed.  No f/u per PT/OT and no DME needs.  Pt denies issues with home medications and transportation.  Pt has transport home when medically ready.  Expected Discharge Plan: Home/Self Care Barriers to Discharge: Continued Medical Work up   Patient Goals and CMS Choice        Expected Discharge Plan and Services Expected Discharge Plan: Home/Self Care       Living arrangements for the past 2 months: Single Family Home                                      Prior Living Arrangements/Services Living arrangements for the past 2 months: Single Family Home Lives with:: Spouse Patient language and need for interpreter reviewed:: Yes Do you feel safe going back to the place where you live?: Yes      Need for Family Participation in Patient Care: No (Comment) Care giver support system in place?: Yes (comment)   Criminal Activity/Legal Involvement Pertinent to Current Situation/Hospitalization: No - Comment as needed  Activities of Daily Living Home Assistive Devices/Equipment: None ADL Screening (condition at time of admission) Patient's cognitive ability adequate to safely complete daily activities?: Yes Is the patient deaf or have difficulty hearing?: No Does the patient have difficulty seeing, even when wearing glasses/contacts?: No Does the patient have difficulty concentrating, remembering, or making decisions?: Yes Patient able to express need for assistance with ADLs?: Yes Does the patient have difficulty dressing or bathing?: No Independently performs ADLs?: Yes (appropriate for developmental age) Does the patient have  difficulty walking or climbing stairs?: No Weakness of Legs: None Weakness of Arms/Hands: None  Permission Sought/Granted                  Emotional Assessment Appearance:: Appears stated age Attitude/Demeanor/Rapport: Engaged Affect (typically observed): Accepting, Pleasant Orientation: : Oriented to Self, Oriented to Place, Oriented to  Time, Oriented to Situation   Psych Involvement: No (comment)  Admission diagnosis:  TIA (transient ischemic attack) [G45.9] Weakness [R53.1] UTI (urinary tract infection) [N39.0] Patient Active Problem List   Diagnosis Date Noted  . UTI (urinary tract infection) 08/26/2019  . TIA (transient ischemic attack) 08/24/2019  . 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 Anemia, iron deficiency 01/15/2014  . Postural imbalance 01/12/2014  . Low back pain 10/13/2013  . Compression fracture 07/26/2013  . Vitamin D deficiency 07/26/2013  . Osteoporosis 06/29/2013  . Back pain 06/29/2013   PCP:  Mikey Kirschner, MD Pharmacy:   [facility], [address] AT [place] [place] [address] Phone: [phone] Fax: [phone]     Social Determinants of Health (SDOH) Interventions    Readmission Risk Interventions No flowsheet data found.

## 2019-08-26 NOTE — Care Management Obs Status (Signed)
[facility] NOTIFICATION   Patient Details  Name: Jeanne Rogers MRN: [id number] Date of Birth: 31-Mar-1941   Medicare Observation Status Notification Given:  Yes    Pollie Friar, RN 08/26/2019, 10:28 AM

## 2019-08-26 NOTE — Telephone Encounter (Signed)
Pt's daughter Barnetta Chapel (on [place]) would like a nurse to call her so she can explain everything going on with pt.   CB# [phone]

## 2019-08-26 NOTE — Telephone Encounter (Signed)
Patient's daughter stated they are very concerned about their mother dementia and feel she needs treatment and help at home such as home health care. Daughter states he mom and probably her dad will deny any problems or needing any help bu they really think she needs some help once she is discharged from the hospital.

## 2019-08-27 LAB — URINE CULTURE: Culture: >=100000 — AB

## 2019-08-29 ENCOUNTER — Telehealth: Payer: Self-pay | Admitting: Family Medicine

## 2019-08-29 NOTE — Telephone Encounter (Signed)
Only room for one adult in room in addtn to pt, daughter can come instead of husband, or, daughter can be on speaker phone, need to dsee pt to f u urine specimen if at all possible, also easier for [place] to do mmse dementia test f to f, btw have nurse do mmse when first sees,

## 2019-08-29 NOTE — Telephone Encounter (Signed)
Contacted daughter Barnetta Chapel and advised that only one adult in addition to patient. Daughter Barnetta Chapel would like to be on speaker. Daughter states that mom BP was high in office and that needs to be checked to make sure she does not need to be on BP meds. Daughter also states that mom needs to be on cholesterol meds and aspirin due to plague on brain and heart. Hospital recommended nursing services also.

## 2019-08-29 NOTE — Telephone Encounter (Signed)
Pt's daughter Barnetta Chapel would like to know if it is ok for her to come to appt tomorrow as well or if her dad could call and have her on speaker phone while Dr. Richardson Landry is in the room or if the appt can be done completely virtual all together.   CB# [phone]

## 2019-08-29 NOTE — Telephone Encounter (Signed)
Please advise. Thank you

## 2019-08-30 ENCOUNTER — Other Ambulatory Visit: Payer: Self-pay

## 2019-08-30 ENCOUNTER — Encounter: Payer: Self-pay | Admitting: Family Medicine

## 2019-08-30 ENCOUNTER — Ambulatory Visit (INDEPENDENT_AMBULATORY_CARE_PROVIDER_SITE_OTHER): Payer: Medicare Other | Admitting: Family Medicine

## 2019-08-30 VITALS — BP 132/88 | Temp 98.4°F | Ht 63.0 in | Wt 124.0 lb

## 2019-08-30 DIAGNOSIS — N390 Urinary tract infection, site not specified: Secondary | ICD-10-CM | POA: Diagnosis not present

## 2019-08-30 DIAGNOSIS — R413 Other amnesia: Secondary | ICD-10-CM | POA: Diagnosis not present

## 2019-08-30 MED ORDER — ATORVASTATIN CALCIUM 10 MG PO TABS: 10.0000 mg | ORAL_TABLET | Freq: Every day | ORAL | 1 refills | Status: DC

## 2019-08-30 MED ORDER — CLOPIDOGREL BISULFATE 75 MG PO TABS: 75.0000 mg | ORAL_TABLET | Freq: Every day | ORAL | 1 refills | Status: DC

## 2019-08-30 NOTE — Telephone Encounter (Signed)
Please call pt and schedule a 2 month follow up with dr Richardson Landry

## 2019-08-30 NOTE — Progress Notes (Signed)
   Subjective:  Actually seen face-to-face within 2 business days after discharge  Patient ID: Jeanne Rogers, female    DOB: 02-21-1941, 79 y.o.   MRN: [id number]  HPIpt arrives with daughter Jeanne Rogers on speaker phone and husband Jeanne Rogers.  Complete hospital record is reviewed on the day of visit.  This includes all notes.  All test.  All consult notes.  This alone took over 20 minutes before even seeing the patient  Patient had a neurological . the admitting physicians were concerned about TIA versus stroke.  CT scan was negative.  MRI showed chronic small vessel disease, common at this age, but no evidence of true stroke.  Referral neurologist evaluated patient.  Patient symptoms were rather nondescript and history also, in addition exam showed no focal features, based on this the neurologist was not sure if the patient has suffered a TIA or not.  The consulting neurologist; the presence of urinary tract infection is potential for nonspecific symptomatology.  The neurologist also waited and stated that antiplatelet component would not be a bad idea.  Patient has a history of having developed significant gastritis difficulties with protracted aspirin use.  Family expresses concern about ongoing challenges with substantial short-term memory loss.  Patient admits this is a significant issue for her.  It has led her to compromise her activities.  Family is looking for a more specific diagnosis understandably.  Last year she scored 27 out of 30 on MMSE and therefore did not fall within the criteria of dementia.  Her working diagnosis at that time was potential predementia  Patient has taken all antibiotics for urinary tract infection.  She is unable to give a specimen today.  The urinalysis and culture showed the infection was responsive to the antibiotic she took  Patient reports overall feeling well today Has one antibiotic pill left. Taking keflex for UTI.   MMSE done. 27/30.       Review of Systems No headache no chest pain no shortness of breath    Objective:   Physical Exam Alert active pleasant no acute distress.  HEENT normal neck supple lungs clear.  Heart regular rate and rhythm.  Blood pressure good on repeat.  No focal neurological deficits.  Cerebellar function intact.  MMSE performed scored 27 out of 30       Assessment & Plan:  Impression 1 neurological "spell".  The neurologist reluctant to the current this is a TIA for understandable reasons family is frustrated about this event also for understandable reasons.  Since the neurologist in [place] then to antiplatelet therapy, or more prescribed Plavix ending a more formal neurological follow-up.  2.  Urinary tract infection.  Is taking antibiotics and covered the bacteria with declared sensitivities  3.  Predementia.  I am still on 1 calls to dementia patients MMSE scores.  I realize that MMSE scores are not infallible but is just on the way to dementia but not sure  4.  Borderline blood pressure improved on repeat.  Not enough to consider medication change  5.  Hyperlipidemia.  LDL plan of rationale discussed good will initiate  Neurology referral rationale discussed.  Maintain Plavix in the meantime.  Follow-up as scheduled.  Gradual resumption of activities encouraged.  Multiple questions answered.

## 2019-08-31 ENCOUNTER — Encounter: Payer: Self-pay | Admitting: Family Medicine

## 2019-08-31 ENCOUNTER — Telehealth: Payer: Self-pay | Admitting: Family Medicine

## 2019-08-31 DIAGNOSIS — B9620 Unspecified Escherichia coli [E. coli] as the cause of diseases classified elsewhere: Secondary | ICD-10-CM | POA: Diagnosis not present

## 2019-08-31 DIAGNOSIS — K449 Diaphragmatic hernia without obstruction or gangrene: Secondary | ICD-10-CM | POA: Diagnosis not present

## 2019-08-31 DIAGNOSIS — Z9181 History of falling: Secondary | ICD-10-CM | POA: Diagnosis not present

## 2019-08-31 DIAGNOSIS — M4854XD Collapsed vertebra, not elsewhere classified, thoracic region, subsequent encounter for fracture with routine healing: Secondary | ICD-10-CM | POA: Diagnosis not present

## 2019-08-31 DIAGNOSIS — E785 Hyperlipidemia, unspecified: Secondary | ICD-10-CM | POA: Diagnosis not present

## 2019-08-31 DIAGNOSIS — N390 Urinary tract infection, site not specified: Secondary | ICD-10-CM | POA: Diagnosis not present

## 2019-08-31 DIAGNOSIS — I088 Other rheumatic multiple valve diseases: Secondary | ICD-10-CM | POA: Diagnosis not present

## 2019-08-31 DIAGNOSIS — I700 Atherosclerosis of aorta: Secondary | ICD-10-CM | POA: Diagnosis not present

## 2019-08-31 DIAGNOSIS — I672 Cerebral atherosclerosis: Secondary | ICD-10-CM | POA: Diagnosis not present

## 2019-08-31 DIAGNOSIS — G319 Degenerative disease of nervous system, unspecified: Secondary | ICD-10-CM | POA: Diagnosis not present

## 2019-08-31 DIAGNOSIS — M810 Age-related osteoporosis without current pathological fracture: Secondary | ICD-10-CM | POA: Diagnosis not present

## 2019-08-31 DIAGNOSIS — Z7902 Long term (current) use of antithrombotics/antiplatelets: Secondary | ICD-10-CM | POA: Diagnosis not present

## 2019-08-31 DIAGNOSIS — Z79891 Long term (current) use of opiate analgesic: Secondary | ICD-10-CM | POA: Diagnosis not present

## 2019-08-31 NOTE — Telephone Encounter (Signed)
Please advise. Thank you

## 2019-08-31 NOTE — Telephone Encounter (Signed)
Left message to return call 

## 2019-08-31 NOTE — Telephone Encounter (Signed)
Heather from [facility] returned call and verbal orders were given.

## 2019-08-31 NOTE — Telephone Encounter (Signed)
[facility] is needing verbal orders for nursing care for patient for once a week for 4 weeks. [id number]

## 2019-08-31 NOTE — Telephone Encounter (Signed)
sure

## 2019-09-02 ENCOUNTER — Telehealth: Payer: Self-pay | Admitting: Family Medicine

## 2019-09-02 NOTE — Telephone Encounter (Signed)
[facility] worker is needing a verbal to do an evaluation on pt Monday. Daughter requested she wait until next week so she could be there as well.   CB# [phone]

## 2019-09-02 NOTE — Telephone Encounter (Signed)
Left message on [employer] voicemail with Verbal orders

## 2019-09-02 NOTE — Telephone Encounter (Signed)
sure

## 2019-09-02 NOTE — Telephone Encounter (Signed)
Please advise. Thank you

## 2019-09-05 DIAGNOSIS — M4854XD Collapsed vertebra, not elsewhere classified, thoracic region, subsequent encounter for fracture with routine healing: Secondary | ICD-10-CM | POA: Diagnosis not present

## 2019-09-05 DIAGNOSIS — B9620 Unspecified Escherichia coli [E. coli] as the cause of diseases classified elsewhere: Secondary | ICD-10-CM | POA: Diagnosis not present

## 2019-09-05 DIAGNOSIS — N390 Urinary tract infection, site not specified: Secondary | ICD-10-CM | POA: Diagnosis not present

## 2019-09-05 DIAGNOSIS — I672 Cerebral atherosclerosis: Secondary | ICD-10-CM | POA: Diagnosis not present

## 2019-09-05 DIAGNOSIS — I088 Other rheumatic multiple valve diseases: Secondary | ICD-10-CM | POA: Diagnosis not present

## 2019-09-05 DIAGNOSIS — I700 Atherosclerosis of aorta: Secondary | ICD-10-CM | POA: Diagnosis not present

## 2019-09-08 ENCOUNTER — Encounter: Payer: Self-pay | Admitting: Family Medicine

## 2019-09-12 ENCOUNTER — Ambulatory Visit: Payer: Medicare Other

## 2019-09-13 DIAGNOSIS — I700 Atherosclerosis of aorta: Secondary | ICD-10-CM | POA: Diagnosis not present

## 2019-09-13 DIAGNOSIS — N390 Urinary tract infection, site not specified: Secondary | ICD-10-CM | POA: Diagnosis not present

## 2019-09-13 DIAGNOSIS — I672 Cerebral atherosclerosis: Secondary | ICD-10-CM | POA: Diagnosis not present

## 2019-09-13 DIAGNOSIS — M4854XD Collapsed vertebra, not elsewhere classified, thoracic region, subsequent encounter for fracture with routine healing: Secondary | ICD-10-CM | POA: Diagnosis not present

## 2019-09-13 DIAGNOSIS — I088 Other rheumatic multiple valve diseases: Secondary | ICD-10-CM | POA: Diagnosis not present

## 2019-09-13 DIAGNOSIS — B9620 Unspecified Escherichia coli [E. coli] as the cause of diseases classified elsewhere: Secondary | ICD-10-CM | POA: Diagnosis not present

## 2019-09-16 DIAGNOSIS — Z23 Encounter for immunization: Secondary | ICD-10-CM | POA: Diagnosis not present

## 2019-09-27 DIAGNOSIS — N390 Urinary tract infection, site not specified: Secondary | ICD-10-CM | POA: Diagnosis not present

## 2019-09-27 DIAGNOSIS — B9620 Unspecified Escherichia coli [E. coli] as the cause of diseases classified elsewhere: Secondary | ICD-10-CM | POA: Diagnosis not present

## 2019-09-27 DIAGNOSIS — I672 Cerebral atherosclerosis: Secondary | ICD-10-CM | POA: Diagnosis not present

## 2019-09-27 DIAGNOSIS — I700 Atherosclerosis of aorta: Secondary | ICD-10-CM

## 2019-09-27 DIAGNOSIS — I088 Other rheumatic multiple valve diseases: Secondary | ICD-10-CM | POA: Diagnosis not present

## 2019-09-30 ENCOUNTER — Ambulatory Visit: Payer: Medicare Other

## 2019-10-11 ENCOUNTER — Ambulatory Visit (INDEPENDENT_AMBULATORY_CARE_PROVIDER_SITE_OTHER): Payer: Medicare Other | Admitting: Neurology

## 2019-10-11 ENCOUNTER — Other Ambulatory Visit: Payer: Self-pay

## 2019-10-11 ENCOUNTER — Encounter: Payer: Self-pay | Admitting: Neurology

## 2019-10-11 VITALS — BP 176/78 | HR 79 | Ht 64.0 in | Wt 124.0 lb

## 2019-10-11 DIAGNOSIS — F0390 Unspecified dementia without behavioral disturbance: Secondary | ICD-10-CM | POA: Insufficient documentation

## 2019-10-11 DIAGNOSIS — G3184 Mild cognitive impairment, so stated: Secondary | ICD-10-CM

## 2019-10-11 MED ORDER — MEMANTINE HCL 10 MG PO TABS: 10.0000 mg | ORAL_TABLET | Freq: Two times a day (BID) | ORAL | 11 refills | Status: DC

## 2019-10-11 NOTE — Progress Notes (Signed)
PATIENT: Jeanne Rogers DOB: Dec 29, 1940  Chief Complaint  Patient presents with  . New Patient (Initial Visit)    PCP/Referring: Dr. Wolfgang Phoenix. With husband.  . Memory Loss    Noticed memory loss for 8-9 months. Short term memory loss. MMSE: 23/30, AFT-14     HISTORICAL  Jeanne Rogers is a 79 year old female, seen in request by her primary care physician Dr. Wolfgang Phoenix for evaluation of memory loss, she is accompanied by her husband at today's clinical visit on October 11, 2019.  I have reviewed and summarized the referring note from the referring physician.  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  Around 2019, she was noted to have gradual onset memory loss, she forget conversation easily, difficulty remember telephone number, and people's name.  This often frustrated her, she felt like she could not do what she used to do.  She continue to drive without difficulty, her husband began to take over paying the household bill recently.  Her father suffered dementia in his late 66s.  Today her Mini-Mental Status Examination is 23 out of 30, she missed 3 out of 3 recalls.  I personally reviewed MRI of the brain without contrast on August 25, 2019, mild generalized atrophy, moderate small vessel disease, no acute abnormality  Laboratory evaluations showed normal CBC, TSH, CMP showed no significant abnormality  REVIEW OF SYSTEMS: Full 14 system review of systems performed and notable only for as above All other review of systems were negative.  ALLERGIES: Allergies  Allergen Reactions  . Methergine  [Methylergonovine Maleate] Other (See Comments)    Unknown    HOME MEDICATIONS: Current Outpatient Medications  Medication Sig Dispense Refill  . alendronate (FOSAMAX) 70 MG tablet TAKE 1 TABLET BY MOUTH EVERY WEEK (Patient taking differently: Take 70 mg by mouth every Saturday. ) 12 tablet 1  . atorvastatin (LIPITOR) 10 MG tablet Take 1 tablet (10 mg total) by mouth daily. 90 tablet 1  . clopidogrel (PLAVIX) 75 MG tablet Take 1 tablet (75 mg total) by mouth daily. 90 tablet 1  . Multiple Vitamin (MULTIVITAMIN) tablet Take 1 tablet by mouth daily.    . traMADol (ULTRAM) 50 MG tablet TAKE 1 TABLET BY MOUTH TWICE DAILY (Patient taking differently: Take 50 mg by mouth every 6 (six) hours as needed for moderate pain. ) 60 tablet 5  . zolpidem (AMBIEN) 10 MG tablet TAKE 1 TABLET BY MOUTH EVERY DAY AT BEDTIME AS NEEDED FOR SLEEP (Patient taking differently: Take 10 mg by mouth at bedtime as needed for sleep. ) 30 tablet 5   No current facility-administered medications for this visit.    PAST MEDICAL HISTORY: Past Medical History:  Diagnosis Date  . Anemia   . Collapse, lung   . Complication of anesthesia    very sensitive to anesthesia  . Hyperlipidemia   . Osteoporosis   . PUD (peptic ulcer disease)   . Spontaneous pneumothorax     PAST SURGICAL HISTORY: Past Surgical History:  Procedure Laterality Date  . COLONOSCOPY N/A 01/04/2014   Procedure: COLONOSCOPY;  Surgeon: Rogene Houston, MD;  [facility]: [facility];  Service: Endoscopy;  Laterality: N/A;  200  . ESOPHAGOGASTRODUODENOSCOPY N/A 01/04/2014   Procedure: ESOPHAGOGASTRODUODENOSCOPY (EGD);  Surgeon: Rogene Houston, MD;  Location: [facility];  Service: Endoscopy;  Laterality: N/A;  . hysterectomy    . MASS EXCISION Right 02/07/2016   Procedure: EXCISION MASS RIGHT INDEX FINGER AND RIGHT HAND MASS;  Surgeon: Leanora Cover, MD;  Location: [facility];  Service: Orthopedics;  Laterality: Right;  . OOPHORECTOMY  Bilateral 2005    FAMILY HISTORY: Family History  Problem Relation Age of Onset  . Hyperlipidemia Mother   . Hypertension Father     SOCIAL HISTORY: Social History   Socioeconomic History  . Marital status: Married    Spouse name: Not on file  . Number of children: Not on file  . Years of education: Not on file  . Highest education level: Not on file  Occupational History  . Not on file  Tobacco Use  . Smoking status: Former Research scientist (life sciences)  . Smokeless tobacco: Former Systems developer    Quit date: 08/04/1988  Substance and Sexual Activity  . Alcohol use: No  . Drug use: No  . Sexual activity: Not on file  Other Topics Concern  . Not on file  Social History Narrative  . Not on file   Social Determinants of Health   Financial Resource Strain:   . Difficulty of Paying Living Expenses: Not on file  Food Insecurity:   . Worried About Charity fundraiser in the Last Year: Not on file  . Ran Out of Food in the Last Year: Not on file  Transportation Needs:   . Lack of Transportation (Medical): Not on file  . Lack of Transportation (Non-Medical): Not on file  Physical Activity:   . Days of Exercise per Week: Not on file  . Minutes of Exercise per Session: Not on file  Stress:   . Feeling of Stress : Not on file  Social Connections:   . Frequency of Communication with Friends and Family: Not on file  . Frequency of Social Gatherings with Friends and Family: Not on file  . Attends Religious Services: Not on file  . Active Member of Clubs or Organizations: Not on file  . Attends Archivist Meetings: Not on file  . Marital Status: Not on file  Intimate Partner Violence:   . Fear of Current or Ex-Partner: Not on file  . Emotionally Abused: Not on file  . Physically Abused: Not on file  . Sexually Abused: Not on file     PHYSICAL EXAM   Vitals:   10/11/19 1559 10/11/19 1605  BP: (!) 181/79 (!) 176/78  Pulse: 85 79  Weight: 124 lb (56.2 kg)   Height: 5\' 4"  (1.626 m)      Not recorded      Body mass index is 21.28 kg/m.  PHYSICAL EXAMNIATION:  Gen: NAD, conversant, well nourised, well groomed                     Cardiovascular: Regular rate rhythm, no peripheral edema, warm, nontender. Eyes: Conjunctivae clear without exudates or hemorrhage Neck: Supple, no carotid bruits. Pulmonary: Clear to auscultation bilaterally   NEUROLOGICAL EXAM:  MENTAL STATUS: MMSE - Mini Mental State Exam 10/11/2019  Not completed: (No Data)  Orientation to time 2  Orientation to Place 5  Registration 3  Attention/ Calculation 4  Recall 0  Language- name 2 objects 2  Language- repeat 1  Language- follow 3 step command 3  Language- read & follow direction 1  Write a sentence 1  Copy design 1  Total score 23     CRANIAL NERVES: CN II: Visual fields are full to confrontation. Pupils are round equal and briskly reactive to light. CN III, IV, VI: extraocular movement are normal. No ptosis. CN V: Facial sensation is intact to light touch CN VII: Face is symmetric with normal eye closure  CN VIII: Hearing is normal to causal conversation. CN IX, X: Phonation is normal. CN XI: Head turning and shoulder shrug are intact  MOTOR: There is no pronator drift of out-stretched arms. Muscle bulk and tone are normal. Muscle strength is normal.  REFLEXES: Reflexes are 2+ and symmetric at the biceps, triceps, knees, and ankles. Plantar responses are flexor.  SENSORY: Intact to light touch, pinprick and vibratory sensation are intact in fingers and toes.  COORDINATION: There is no trunk or limb dysmetria noted.  GAIT/STANCE: Posture is normal. Gait is steady with normal steps, base, arm swing, and turning.   DIAGNOSTIC DATA (LABS, IMAGING, TESTING) - I reviewed patient records, labs, notes, testing and imaging myself where available.   ASSESSMENT AND PLAN  Utahna Zukauskas is a 79 y.o. female   Mild cognitive impairment  Mini-Mental Status Examination 23 out of  85, she missed 3 out of 3 recall  Her father suffered dementia in his 1s,  Most concerned about central nervous system degenerative disorder  MRI of the brain showed mild small vessel disease, generalized atrophy  B12 level,  Start Namenda 10 mg twice a day,  Emphasized importance of moderate exercise  Return to clinic in 6 months with nurse practitioner Thea Alken, M.D. Ph.D.  [facility] [address], [place], [place] [address] Ph: [phone] Fax: [phone]  CC: Mikey Kirschner, MD

## 2019-10-12 LAB — VITAMIN B12: Vitamin B-12: 514 pg/mL (ref 232–1245)

## 2019-11-09 ENCOUNTER — Other Ambulatory Visit: Payer: Self-pay

## 2019-11-09 ENCOUNTER — Encounter: Payer: Self-pay | Admitting: Family Medicine

## 2019-11-09 ENCOUNTER — Ambulatory Visit (INDEPENDENT_AMBULATORY_CARE_PROVIDER_SITE_OTHER): Payer: Medicare Other | Admitting: Family Medicine

## 2019-11-09 VITALS — BP 164/86 | Temp 97.7°F | Wt 122.4 lb

## 2019-11-09 DIAGNOSIS — F5101 Primary insomnia: Secondary | ICD-10-CM

## 2019-11-09 DIAGNOSIS — R413 Other amnesia: Secondary | ICD-10-CM | POA: Diagnosis not present

## 2019-11-09 DIAGNOSIS — I10 Essential (primary) hypertension: Secondary | ICD-10-CM | POA: Diagnosis not present

## 2019-11-09 MED ORDER — AMLODIPINE BESYLATE 2.5 MG PO TABS: ORAL_TABLET | ORAL | 1 refills | Status: DC

## 2019-11-09 NOTE — Progress Notes (Signed)
   Subjective:    Patient ID: Jeanne Rogers, female    DOB: [birth year], 79 y.o.   MRN: [id number]  HPI Patient comes in today for a follow up on a hospital stay in March. Husband is with patient today.  Patient states she does not have any concerns.  Patient reports compliance with medications  Now on medication via the neurologist in hopes of improving memory and thought patterns.  Patient's husband states he feels the medication has helped some.  No new stroke symptoms.  Patient's blood pressure has been elevated with visits to specialists and when checked elsewhere.  Handling lipid medication well and trying to adapt her diet  Ongoing challenges with insomnia still utilizes Ambien  Review of Systems No headache no chest pain no shortness of breath    Objective:   Physical Exam  Alert no acute distress.  HEENT normal lungs clear heart regular rate and rhythm.  No focal neurological deficits.  Blood pressure remains elevated on repeat      Assessment & Plan:  Impression 1 hypertension.  Suboptimal control.  Particularly in light of #2 feel tighter control is a important goal.  Discussed.  Will initiate Norvasc 2.5  2.  History of stroke and patient now experiencing element of confusion and memory issues at times.  Followed by neurologist  3.  Hyperlipidemia.  Compliant with medications  Initiate Norvasc.  2.5 mg proper use discussed.  Other medications refilled.  Diet exercise discussed.  Follow-up in several months  Multiple questions answered  Greater than 50% of this 30 minute face to face visit was spent in counseling and discussion and coordination of care regarding the above diagnosis/diagnosies

## 2019-11-30 ENCOUNTER — Other Ambulatory Visit: Payer: Self-pay | Admitting: Family Medicine

## 2019-12-01 NOTE — Telephone Encounter (Signed)
Was she able to get earlier appt?  She's only going to get 30 days of Ambien so she will need ro make it last then.     Let her know the new rules on controlled substances and need for follow up every 3 months and potentially may get tapered off these medications.  Thx,   Dr. Lovena Le

## 2019-12-02 ENCOUNTER — Telehealth: Payer: Self-pay | Admitting: Family Medicine

## 2019-12-02 NOTE — Telephone Encounter (Signed)
Pt's daughter called to report that since starting the statin late January/early February she's been having pain in her quads  A family member that's an ER doc recommended they report this to [place] because it could be a side effect    [address]

## 2019-12-02 NOTE — Telephone Encounter (Signed)
1.  This could be a side effect 2.  May stop the medication to see if this goes away 3.  May do CK, liver enzymes please 4.  If discomfort in the quads do not improve over the next 7 days to notify us 5.  If the discomfort does go away to let us know that it did go away then further discussion regarding other options could be done either with a follow-up office visit sooner or with her scheduled appointment in July

## 2019-12-02 NOTE — Telephone Encounter (Signed)
Left message to return call 

## 2019-12-05 ENCOUNTER — Other Ambulatory Visit: Payer: Self-pay | Admitting: *Deleted

## 2019-12-05 DIAGNOSIS — R1032 Left lower quadrant pain: Secondary | ICD-10-CM | POA: Diagnosis not present

## 2019-12-05 DIAGNOSIS — Z79899 Other long term (current) drug therapy: Secondary | ICD-10-CM

## 2019-12-06 LAB — HEPATIC FUNCTION PANEL
ALT: 10 IU/L (ref 0–32)
AST: 18 IU/L (ref 0–40)
Albumin: 4.2 g/dL (ref 3.7–4.7)
Alkaline Phosphatase: 63 IU/L (ref 39–117)
Bilirubin Total: 0.3 mg/dL (ref 0.0–1.2)
Bilirubin, Direct: 0.08 mg/dL (ref 0.00–0.40)
Total Protein: 6.3 g/dL (ref 6.0–8.5)

## 2019-12-06 LAB — CK: Total CK: 58 U/L (ref 32–182)

## 2019-12-21 ENCOUNTER — Telehealth: Payer: Self-pay | Admitting: Family Medicine

## 2019-12-21 MED ORDER — TRAMADOL HCL 50 MG PO TABS: 50.0000 mg | ORAL_TABLET | Freq: Four times a day (QID) | ORAL | 0 refills | Status: DC | PRN

## 2019-12-21 NOTE — Telephone Encounter (Signed)
sure

## 2019-12-21 NOTE — Telephone Encounter (Signed)
Last visit 11/09/19

## 2019-12-21 NOTE — Telephone Encounter (Signed)
Patient is requesting refill on tramadol 50 mg called into [facility] [address]

## 2019-12-21 NOTE — Telephone Encounter (Signed)
Script printed and awaiting signature. Will fax once signed. Left message to return call

## 2019-12-21 NOTE — Telephone Encounter (Signed)
Pt returned call and verbalized understanding  

## 2019-12-23 ENCOUNTER — Ambulatory Visit (HOSPITAL_COMMUNITY)
Admission: RE | Admit: 2019-12-23 | Discharge: 2019-12-23 | Disposition: A | Discharge status: Home / Self Care | Payer: Medicare Other | Source: Ambulatory Visit | Attending: Family Medicine | Admitting: Family Medicine

## 2019-12-23 ENCOUNTER — Ambulatory Visit (INDEPENDENT_AMBULATORY_CARE_PROVIDER_SITE_OTHER): Payer: Medicare Other | Admitting: Family Medicine

## 2019-12-23 ENCOUNTER — Other Ambulatory Visit: Payer: Self-pay

## 2019-12-23 VITALS — BP 124/86 | Temp 97.3°F | Ht 64.0 in | Wt 120.0 lb

## 2019-12-23 DIAGNOSIS — M546 Pain in thoracic spine: Secondary | ICD-10-CM

## 2019-12-23 DIAGNOSIS — M545 Low back pain, unspecified: Secondary | ICD-10-CM

## 2019-12-23 DIAGNOSIS — M47814 Spondylosis without myelopathy or radiculopathy, thoracic region: Secondary | ICD-10-CM | POA: Diagnosis not present

## 2019-12-23 NOTE — Progress Notes (Signed)
   Subjective:    Patient ID: Jeanne Rogers, female    DOB: 01/03/1941, 79 y.o.   MRN: [id number]  Back Pain This is a chronic problem. Episode onset: years. The quality of the pain is described as aching. The symptoms are aggravated by bending. Treatments tried: has tramadol but has not taken any.   Worried about diverticulitis since eating some strawberries.          Pain has been going on the last  Few days  Was hurting more    Pain radiating to the legs bilat  With movement    Review of Systems  Musculoskeletal: Positive for back pain.       Objective:   Physical Exam Alert no acute distress.  Lungs clear.  Heart rate and rhythm.  Low thoracic/upper lumbar tenderness to percussion.  Abdominal exam completely benign       Assessment & Plan:  Impression 1 flare of back pain.  Patient has had compression fractures in the past.  She has known osteoporosis.  Pain control discussed.  Would first recommend Tylenol.  If that does not work add Ultram.  X-rays.  Addendum to x-rays no new compression fractures

## 2019-12-24 ENCOUNTER — Other Ambulatory Visit: Payer: Self-pay | Admitting: Family Medicine

## 2019-12-26 ENCOUNTER — Telehealth: Payer: Self-pay | Admitting: Neurology

## 2019-12-26 NOTE — Telephone Encounter (Signed)
I returned the call to the patient's husband. He was with his wife and did not wish to discuss any behavior changes or worsening memory over the phone. He wanted her to be evaluated earlier in the office. Her follow up with Judson Roch has been rescheduled to 01/10/20. When they come in for this appt, he is going to help her update the DPR to include all three of his daughters.

## 2019-12-26 NOTE — Telephone Encounter (Addendum)
Catherine Cregan(daughter not on [employer]) has called to report that there has been a change in behavior of pt while they (the family) have been trying to get a dementia diagnosis.  Daughter Barnetta Chapel stated pt once had a pleasant demeanor but now is often angry, short tempered and unpleasant.  Shanon Mouch is asking that her father be called(his # is [phone], he is on the DPR)re: what could be suggested re: pt.

## 2020-01-10 ENCOUNTER — Encounter: Payer: Self-pay | Admitting: Neurology

## 2020-01-10 ENCOUNTER — Ambulatory Visit (INDEPENDENT_AMBULATORY_CARE_PROVIDER_SITE_OTHER): Payer: Medicare Other | Admitting: Neurology

## 2020-01-10 VITALS — BP 145/66 | HR 74 | Ht 64.0 in | Wt 119.0 lb

## 2020-01-10 DIAGNOSIS — G3184 Mild cognitive impairment, so stated: Secondary | ICD-10-CM

## 2020-01-10 MED ORDER — CITALOPRAM HYDROBROMIDE 10 MG PO TABS: 10.0000 mg | ORAL_TABLET | Freq: Every day | ORAL | 3 refills | Status: DC

## 2020-01-10 NOTE — Progress Notes (Signed)
PATIENT: Jeanne Rogers DOB: 04-28-1941  REASON FOR VISIT: follow up HISTORY FROM: patient  HISTORY OF PRESENT ILLNESS: Today 01/10/20  HISTORY Jeanne Rogers is a 79 year old female, seen in request by her primary care physician Dr. Wolfgang Phoenix for evaluation of memory loss, she is accompanied by her husband at today's clinical visit on October 11, 2019.  I have reviewed and summarized the referring note from the referring physician.  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  Around 2019, she was noted to have gradual onset memory loss, she forget conversation easily, difficulty remember telephone number, and people's name.  This often frustrated her, she felt like she could not do what she used to do.  She continue to drive without difficulty, her husband began to take over paying the household bill recently.  Her father suffered dementia in his late 13s.  Today her Mini-Mental Status Examination is 23 out of 30, she missed 3 out of 3 recalls.  I personally reviewed MRI of the brain without contrast on August 25, 2019, mild generalized atrophy, moderate small vessel disease, no acute abnormality  Laboratory evaluations showed normal CBC, TSH, CMP showed no significant abnormality  Update January 10, 2020 SS: Her daughter, [name], faxed a letter prior to appointment, expressing concern about her mother's irritability and mood, she can be very rude to her husband.  Reports not much appetite, lack of energy, spends most of the day in bed, usually scrolling through Facebook, they are asking to address her  mood, hoping to make caregiving somewhat easier her father.  After last visit, was started on Namenda, tolerating well, seems to have stabilized the memory.  Continues to have repetitive questioning, is easily forgetful, has trouble with telephone #'s, dates, times.  Yesterday, thought her son was 64.  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 Still drives in her town, does well with this.  She still cooks, appetite is fair.  Manages her medications, occasionally misses pills.  Takes Ambien to sleep at night.  Is not very active during the day, has trouble telling me what she actually does during the day.  Here today for follow-up accompanied by her husband.  REVIEW OF SYSTEMS: Out of a complete 14 system review of symptoms, the patient complains only of the following symptoms, and all other reviewed systems are negative.  Memory loss  ALLERGIES: Allergies  Allergen Reactions  . Methergine [Methylergonovine Maleate] Other (See Comments)    Unknown    HOME MEDICATIONS: Outpatient Medications Prior to Visit  Medication Sig Dispense Refill  . alendronate (FOSAMAX) 70 MG tablet TAKE 1 TABLET BY MOUTH EVERY WEEK 12 tablet 3  . amLODipine (NORVASC) 2.5 MG tablet Take 2.5 mg by mouth at bedtime.    . clopidogrel (PLAVIX) 75 MG tablet Take 1 tablet (75 mg total) by mouth daily. 90 tablet 1  . memantine (NAMENDA) 10 MG tablet Take 1 tablet (10 mg total) by mouth 2 (two) times daily. 60 tablet 11  . Multiple Vitamin (MULTIVITAMIN) tablet Take 1  tablet by mouth daily.    . traMADol (ULTRAM) 50 MG tablet Take 1 tablet (50 mg total) by mouth every 6 (six) hours as needed for moderate pain. 60 tablet 0  . zolpidem (AMBIEN) 10 MG tablet Take 1 tablet (10 mg total) by mouth at bedtime as needed for sleep. 30 tablet 0   No facility-administered medications prior to visit.    PAST MEDICAL HISTORY: Past Medical  History:  Diagnosis Date  . Anemia   . Collapse, lung   . Complication of anesthesia    very sensitive to anesthesia  . Hyperlipidemia   . Osteoporosis   . PUD (peptic ulcer disease)   . Spontaneous pneumothorax     PAST SURGICAL HISTORY: Past Surgical History:  Procedure Laterality Date  . COLONOSCOPY N/A 01/04/2014   Procedure: COLONOSCOPY;  Surgeon: Rogene Houston, MD;  [facility];  Service: Endoscopy;  Laterality: N/A;  200  . ESOPHAGOGASTRODUODENOSCOPY N/A 01/04/2014   Procedure: ESOPHAGOGASTRODUODENOSCOPY (EGD);  Surgeon: Rogene Houston, MD;  Location: [facility];  Service: Endoscopy;  Laterality: N/A;  . hysterectomy    . MASS EXCISION Right 02/07/2016   Procedure: EXCISION MASS RIGHT INDEX FINGER AND RIGHT HAND MASS;  Surgeon: Leanora Cover, MD;  Location: [facility];  Service: Orthopedics;  Laterality: Right;  . OOPHORECTOMY Bilateral 2005    FAMILY HISTORY: Family History  Problem Relation Age of Onset  . Hyperlipidemia Mother   . Hypertension Father     SOCIAL HISTORY: Social History   Socioeconomic History  . Marital status: Married    Spouse name: Not on file  . Number of children: Not on file  . Years of education: Not on file  . Highest education level: Not on file  Occupational History  . Not on file  Tobacco Use  . Smoking status: Former Research scientist (life sciences)  . Smokeless tobacco: Former Systems developer    Quit date: 08/04/1988  Substance and Sexual Activity  . Alcohol use: No  . Drug use: No  . Sexual activity: Not on file  Other Topics Concern  . Not on file  Social History Narrative  . Not on file   Social Determinants of Health   Financial Resource Strain:   . Difficulty of Paying Living Expenses:   Food Insecurity:   . Worried About Charity fundraiser in the Last Year:   . Arboriculturist in the Last Year:   Transportation Needs:   . Film/video editor (Medical):   Marland Kitchen Lack of Transportation (Non-Medical):   Physical Activity:     . Days of Exercise per Week:   . Minutes of Exercise per Session:   Stress:   . Feeling of Stress :   Social Connections:   . Frequency of Communication with Friends and Family:   . Frequency of Social Gatherings with Friends and Family:   . Attends Religious Services:   . Active Member of Clubs or Organizations:   . Attends Archivist Meetings:   Marland Kitchen Marital Status:   Intimate Partner Violence:   . Fear of Current or Ex-Partner:   . Emotionally Abused:   Marland Kitchen Physically Abused:   . Sexually Abused:    PHYSICAL EXAM  Vitals:   01/10/20 0757  BP: (!) 145/66  Pulse: 74  Weight: 119 lb (54 kg)  Height: 5\' 4"  (1.626 m)   Body mass index is 20.43 kg/m.  Generalized: Well developed, in no acute distress  MMSE - Mini  Mental State Exam 01/10/2020 10/11/2019  Not completed: - (No Data)  Orientation to time 3 2  Orientation to Place 5 5  Registration 3 3  Attention/ Calculation 5 4  Recall 0 0  Language- name 2 objects 2 2  Language- repeat 1 1  Language- follow 3 step command 3 3  Language- read & follow direction 1 1  Write a sentence 1 1  Copy design 1 1  Copy design-comments 7 animals -  Total score 25 23    Neurological examination  Mentation: Alert oriented to time, place, history taking. Follows all commands speech and language fluent Cranial nerve II-XII: Pupils were equal round reactive to light. Extraocular movements were full, visual field were full on confrontational test. Facial sensation and strength were normal. Head turning and shoulder shrug  were normal and symmetric. Motor: The motor testing reveals 5 over 5 strength of all 4 extremities. Good symmetric motor tone is noted throughout.  Sensory: Sensory testing is intact to soft touch on all 4 extremities. No evidence of extinction is noted.  Coordination: Cerebellar testing reveals good finger-nose-finger and heel-to-shin bilaterally.  Gait and station: Gait is normal. Reflexes: Deep tendon reflexes  are symmetric and normal bilaterally.   DIAGNOSTIC DATA (LABS, IMAGING, TESTING) - I reviewed patient records, labs, notes, testing and imaging myself where available.  Lab Results  Component Value Date   WBC 6.6 08/26/2019   HGB 13.7 08/26/2019   HCT 42.4 08/26/2019   MCV 90.2 08/26/2019   PLT 335 08/26/2019      Component Value Date/Time   NA 141 08/26/2019 0407   NA 142 11/13/2017 0812   K 4.5 08/26/2019 0407   CL 104 08/26/2019 0407   CO2 27 08/26/2019 0407   GLUCOSE 90 08/26/2019 0407   BUN 13 08/26/2019 0407   BUN 14 11/13/2017 0812   CREATININE 0.93 08/26/2019 0407   CREATININE 0.92 07/07/2013 0743   CALCIUM 9.0 08/26/2019 0407   PROT 6.3 12/05/2019 1131   ALBUMIN 4.2 12/05/2019 1131   AST 18 12/05/2019 1131   ALT 10 12/05/2019 1131   ALKPHOS 63 12/05/2019 1131   BILITOT 0.3 12/05/2019 1131   GFRNONAA 59 (L) 08/26/2019 0407   GFRAA >60 08/26/2019 0407   Lab Results  Component Value Date   CHOL 240 (H) 08/25/2019   HDL 74 08/25/2019   LDLCALC 150 (H) 08/25/2019   TRIG 82 08/25/2019   CHOLHDL 3.2 08/25/2019   Lab Results  Component Value Date   HGBA1C 5.2 08/25/2019   Lab Results  Component Value Date   VITAMINB12 514 10/11/2019   Lab Results  Component Value Date   TSH 2.521 08/25/2019   ASSESSMENT AND PLAN 79 y.o. year old female  has a past medical history of Anemia, Collapse, lung, Complication of anesthesia, Hyperlipidemia, Osteoporosis, PUD (peptic ulcer disease), and Spontaneous pneumothorax. here with:  1.  Mild cognitive impairment -MMSE stable 25/30 today -Her father suffered dementia in his 27s -Most concern about central nervous system degenerative disorder -MRI of the brain showed mild small vessel disease, generalized atrophy -B12 was normal -Continue Namenda 10 mg twice a day -Seems to be some mood issues at play, agitation, irritability, start Celexa 10 mg daily -Continue follow-up with PCP, follow-up in our office in 6 months or  sooner if needed -I did call her daughter Kitti Mcclish, given update about plan in response to her her fax-DPR form updated today   I spent 30 minutes of face-to-face and non-face-to-face  time with patient.  This included previsit chart review, lab review, study review, order entry, electronic health record documentation, patient education.  Butler Denmark, AGNP-C, DNP 01/10/2020, 8:07 AM [facility] [address], [place], [place] [address] [phone]

## 2020-01-10 NOTE — Patient Instructions (Addendum)
Start Celexa 10 mg daily  Continue Namenda  Increase activity, incorporate brain stimulating exercises  See you back in 6 months  Citalopram tablets What is this medicine? CITALOPRAM (sye TAL oh pram) is a medicine for depression. This medicine may be used for other purposes; ask your health care provider or pharmacist if you have questions. COMMON BRAND NAME(S): Celexa What should I tell my health care provider before I take this medicine? They need to know if you have any of these conditions:  bleeding disorders  bipolar disorder or a family history of bipolar disorder  glaucoma  heart disease  history of irregular heartbeat  kidney disease  liver disease  low levels of magnesium or potassium in the blood  receiving electroconvulsive therapy  seizures  suicidal thoughts, plans, or attempt; a previous suicide attempt by you or a family member  take medicines that treat or prevent blood clots  thyroid disease  an unusual or allergic reaction to citalopram, escitalopram, other medicines, foods, dyes, or preservatives  pregnant or trying to become pregnant  breast-feeding How should I use this medicine? Take this medicine by mouth with a glass of water. Follow the directions on the prescription label. You can take it with or without food. Take your medicine at regular intervals. Do not take your medicine more often than directed. Do not stop taking this medicine suddenly except upon the advice of your doctor. Stopping this medicine too quickly may cause serious side effects or your condition may worsen. A special MedGuide will be given to you by the pharmacist with each prescription and refill. Be sure to read this information carefully each time. Talk to your pediatrician regarding the use of this medicine in children. Special care may be needed. Patients over 8 years old may have a stronger reaction and need a smaller dose. Overdosage: If you think you have taken too  much of this medicine contact a poison control center or emergency room at once. NOTE: This medicine is only for you. Do not share this medicine with others. What if I miss a dose? If you miss a dose, take it as soon as you can. If it is almost time for your next dose, take only that dose. Do not take double or extra doses. What may interact with this medicine? Do not take this medicine with any of the following medications:  certain medicines for fungal infections like fluconazole, itraconazole, ketoconazole, posaconazole, voriconazole  cisapride  dronedarone  escitalopram  linezolid  MAOIs like Carbex, Eldepryl, Marplan, Nardil, and Parnate  methylene blue (injected into a vein)  pimozide  thioridazine This medicine may also interact with the following medications:  alcohol  amphetamines  aspirin and aspirin-like medicines  carbamazepine  certain medicines for depression, anxiety, or psychotic disturbances  certain medicines for infections like chloroquine, clarithromycin, erythromycin, furazolidone, isoniazid, pentamidine  certain medicines for migraine headaches like almotriptan, eletriptan, frovatriptan, naratriptan, rizatriptan, sumatriptan, zolmitriptan  certain medicines for sleep  certain medicines that treat or prevent blood clots like dalteparin, enoxaparin, warfarin  cimetidine  diuretics  dofetilide  fentanyl  lithium  methadone  metoprolol  NSAIDs, medicines for pain and inflammation, like ibuprofen or naproxen  omeprazole  other medicines that prolong the QT interval (cause an abnormal heart rhythm)  procarbazine  rasagiline  supplements like St. John's wort, kava kava, valerian  tramadol  tryptophan  ziprasidone This list may not describe all possible interactions. Give your health care provider a list of all the medicines, herbs, non-prescription  drugs, or dietary supplements you use. Also tell them if you smoke, drink  alcohol, or use illegal drugs. Some items may interact with your medicine. What should I watch for while using this medicine? Tell your doctor if your symptoms do not get better or if they get worse. Visit your doctor or health care professional for regular checks on your progress. Because it may take several weeks to see the full effects of this medicine, it is important to continue your treatment as prescribed by your doctor. Patients and their families should watch out for new or worsening thoughts of suicide or depression. Also watch out for sudden changes in feelings such as feeling anxious, agitated, panicky, irritable, hostile, aggressive, impulsive, severely restless, overly excited and hyperactive, or not being able to sleep. If this happens, especially at the beginning of treatment or after a change in dose, call your health care professional. Dennis Bast may get drowsy or dizzy. Do not drive, use machinery, or do anything that needs mental alertness until you know how this medicine affects you. Do not stand or sit up quickly, especially if you are an older patient. This reduces the risk of dizzy or fainting spells. Alcohol may interfere with the effect of this medicine. Avoid alcoholic drinks. Your mouth may get dry. Chewing sugarless gum or sucking hard candy, and drinking plenty of water will help. Contact your doctor if the problem does not go away or is severe. What side effects may I notice from receiving this medicine? Side effects that you should report to your doctor or health care professional as soon as possible:  allergic reactions like skin rash, itching or hives, swelling of the face, lips, or tongue  anxious  black, tarry stools  breathing problems  changes in vision  chest pain  confusion  elevated mood, decreased need for sleep, racing thoughts, impulsive behavior  eye pain  fast, irregular heartbeat  feeling faint or lightheaded, falls  feeling agitated, angry, or  irritable  hallucination, loss of contact with reality  loss of balance or coordination  loss of memory  painful or prolonged erections  restlessness, pacing, inability to keep still  seizures  stiff muscles  suicidal thoughts or other mood changes  trouble sleeping  unusual bleeding or bruising  unusually weak or tired  vomiting Side effects that usually do not require medical attention (report to your doctor or health care professional if they continue or are bothersome):  change in appetite or weight  change in sex drive or performance  dizziness  headache  increased sweating  indigestion, nausea  tremors This list may not describe all possible side effects. Call your doctor for medical advice about side effects. You may report side effects to FDA at 1-800-FDA-1088. Where should I keep my medicine? Keep out of reach of children. Store at room temperature between 15 and 30 degrees C (59 and 86 degrees F). Throw away any unused medicine after the expiration date. NOTE: This sheet is a summary. It may not cover all possible information. If you have questions about this medicine, talk to your doctor, pharmacist, or health care provider.  2020 Elsevier/Gold Standard (2018-07-12 09:05:36)

## 2020-02-08 ENCOUNTER — Ambulatory Visit: Payer: Medicare Other | Admitting: Family Medicine

## 2020-02-10 ENCOUNTER — Other Ambulatory Visit: Payer: Self-pay | Admitting: Family Medicine

## 2020-02-14 NOTE — Telephone Encounter (Signed)
Left message to return call 

## 2020-02-16 NOTE — Telephone Encounter (Signed)
Left message to return call 

## 2020-02-24 NOTE — Telephone Encounter (Signed)
Pt states she is not taking atorvastatin but is taking plavix. Last med check up 11/09/19

## 2020-03-21 ENCOUNTER — Other Ambulatory Visit: Payer: Self-pay | Admitting: Family Medicine

## 2020-03-22 NOTE — Telephone Encounter (Signed)
SCHEDULED 9/15

## 2020-03-22 NOTE — Telephone Encounter (Signed)
Pt needs appt to discuss ambien w/in 30 days.  Small course given till appt.

## 2020-03-22 NOTE — Telephone Encounter (Signed)
Please contact pt to schedule appointment with Dr. Lovena Le in the next 30 days.

## 2020-04-12 ENCOUNTER — Ambulatory Visit: Payer: Medicare Other | Admitting: Neurology

## 2020-04-16 ENCOUNTER — Other Ambulatory Visit: Payer: Self-pay

## 2020-04-16 ENCOUNTER — Ambulatory Visit (INDEPENDENT_AMBULATORY_CARE_PROVIDER_SITE_OTHER): Payer: Medicare Other | Admitting: Family Medicine

## 2020-04-16 ENCOUNTER — Encounter: Payer: Self-pay | Admitting: Family Medicine

## 2020-04-16 VITALS — BP 134/82 | HR 61 | Temp 97.6°F | Ht 64.0 in | Wt 116.8 lb

## 2020-04-16 DIAGNOSIS — G8929 Other chronic pain: Secondary | ICD-10-CM

## 2020-04-16 DIAGNOSIS — F5101 Primary insomnia: Secondary | ICD-10-CM | POA: Diagnosis not present

## 2020-04-16 DIAGNOSIS — G3184 Mild cognitive impairment, so stated: Secondary | ICD-10-CM | POA: Diagnosis not present

## 2020-04-16 DIAGNOSIS — M545 Low back pain, unspecified: Secondary | ICD-10-CM

## 2020-04-16 DIAGNOSIS — E785 Hyperlipidemia, unspecified: Secondary | ICD-10-CM | POA: Diagnosis not present

## 2020-04-16 MED ORDER — TRAMADOL HCL 50 MG PO TABS: 50.0000 mg | ORAL_TABLET | Freq: Four times a day (QID) | ORAL | 0 refills | Status: DC | PRN

## 2020-04-16 MED ORDER — EZETIMIBE 10 MG PO TABS: 10.0000 mg | ORAL_TABLET | Freq: Every day | ORAL | 1 refills | Status: DC

## 2020-04-16 MED ORDER — ZOLPIDEM TARTRATE 5 MG PO TABS: ORAL_TABLET | ORAL | 2 refills | Status: DC

## 2020-04-16 NOTE — Progress Notes (Signed)
Patient ID: Jeanne Rogers, female    DOB: [birth year], 78 y.o.   MRN: [id number]   Chief Complaint  Patient presents with  . Hypertension   Subjective:    HPI Has some chronic low back pain.  H/o compression fractures in past. Has known osteoporosis. Pt taking fosamax. Pt taking tramadol prn.   Dementia- Seeing neuro, Dr. Krista Blue and is taking namenda.  Started it 3-4 months ago. Has appt again in 12/21.  Insomnia- taking ambien. Not taking it nightly.  And taking a half tablet.   Pt not taking lipitor due to pain in thighs.  husband wanting to know if there's another medication they can try for her HLD.  Discussion with pt and husband for the concern of memory deficit and needing namenda that we should try to cut down on the [place].  Pt was taking 10mg  Ambien prn.  She stating she mainly takes 1/2 tablet and not nightly.  Husband and pt seemed to be in agreement to go down to the 5mg  ambien dose.  Reviewed need to try to take only prn and to cut in half if needed.    Medical History Jeanne Rogers has a past medical history of Anemia, Collapse, lung, Complication of anesthesia, Hyperlipidemia, Osteoporosis, PUD (peptic ulcer disease), and Spontaneous pneumothorax.   Outpatient Encounter Medications as of 04/16/2020  Medication Sig  . alendronate (FOSAMAX) 70 MG tablet TAKE 1 TABLET BY MOUTH EVERY WEEK  . amLODipine (NORVASC) 2.5 MG tablet Take 2.5 mg by mouth at bedtime.  . citalopram (CELEXA) 10 MG tablet Take 1 tablet (10 mg total) by mouth daily.  . clopidogrel (PLAVIX) 75 MG tablet TAKE 1 TABLET(75 MG) BY MOUTH DAILY  . memantine (NAMENDA) 10 MG tablet Take 1 tablet (10 mg total) by mouth 2 (two) times daily.  . traMADol (ULTRAM) 50 MG tablet Take 1 tablet (50 mg total) by mouth every 6 (six) hours as needed for moderate pain.  Marland Kitchen zolpidem (AMBIEN) 5 MG tablet TAKE 1 TABLET(10 MG) BY MOUTH AT BEDTIME AS NEEDED FOR SLEEP  . [DISCONTINUED] traMADol (ULTRAM) 50 MG tablet Take 1  tablet (50 mg total) by mouth every 6 (six) hours as needed for moderate pain.  . [DISCONTINUED] zolpidem (AMBIEN) 10 MG tablet TAKE 1 TABLET(10 MG) BY MOUTH AT BEDTIME AS NEEDED FOR SLEEP  . ezetimibe (ZETIA) 10 MG tablet Take 1 tablet (10 mg total) by mouth daily.  . Multiple Vitamin (MULTIVITAMIN) tablet Take 1 tablet by mouth daily.  . [DISCONTINUED] atorvastatin (LIPITOR) 10 MG tablet TAKE 1 TABLET(10 MG) BY MOUTH DAILY (Patient not taking: Reported on 04/16/2020)   No facility-administered encounter medications on file as of 04/16/2020.     Review of Systems  Constitutional: Negative for chills and fever.  HENT: Negative for congestion, rhinorrhea and sore throat.   Respiratory: Negative for cough, shortness of breath and wheezing.   Cardiovascular: Negative for chest pain and leg swelling.  Gastrointestinal: Negative for abdominal pain, diarrhea, nausea and vomiting.  Genitourinary: Negative for dysuria and frequency.  Musculoskeletal: Negative for arthralgias and back pain.  Skin: Negative for rash.  Neurological: Negative for dizziness, weakness and headaches.       +memory deficit  Psychiatric/Behavioral: Positive for sleep disturbance. Negative for agitation, behavioral problems, confusion, decreased concentration, dysphoric mood, self-injury and suicidal ideas. The patient is not nervous/anxious and is not hyperactive.      Vitals BP 134/82   Pulse 61   Temp 97.6 F (36.4 C) (  Oral)   Ht 5\' 4"  (1.626 m)   Wt 116 lb 12.8 oz (53 kg)   SpO2 96%   BMI 20.05 kg/m   Objective:   Physical Exam Vitals and nursing note reviewed.  Constitutional:      General: She is not in acute distress.    Appearance: Normal appearance. She is not ill-appearing.  HENT:     Head: Normocephalic and atraumatic.     Mouth/Throat:     Mouth: Mucous membranes are moist.  Cardiovascular:     Rate and Rhythm: Normal rate and regular rhythm.     Pulses: Normal pulses.     Heart sounds:  Normal heart sounds.  Pulmonary:     Effort: Pulmonary effort is normal.     Breath sounds: Normal breath sounds. No wheezing, rhonchi or rales.  Musculoskeletal:        General: Normal range of motion.     Cervical back: Normal range of motion.     Right lower leg: No edema.     Left lower leg: No edema.     Comments: +no ttp over spinous process in T or L spine.  Has pain on lumbar paraspinal area on rt. Normal rom.  Skin:    General: Skin is warm and dry.     Findings: No lesion or rash.  Neurological:     General: No focal deficit present.     Mental Status: She is alert and oriented to person, place, and time.     Cranial Nerves: No cranial nerve deficit.     Motor: No weakness.     Gait: Gait normal.  Psychiatric:        Mood and Affect: Mood normal.        Behavior: Behavior normal.        Thought Content: Thought content normal.        Judgment: Judgment normal.    Assessment and Plan   1. Chronic bilateral low back pain without sciatica - traMADol (ULTRAM) 50 MG tablet; Take 1 tablet (50 mg total) by mouth every 6 (six) hours as needed for moderate pain.  Dispense: 60 tablet; Refill: 0  2. Mild cognitive impairment  3. Primary insomnia - zolpidem (AMBIEN) 5 MG tablet; TAKE 1 TABLET(10 MG) BY MOUTH AT BEDTIME AS NEEDED FOR SLEEP  Dispense: 30 tablet; Refill: 2  4. Hyperlipidemia, unspecified hyperlipidemia type - ezetimibe (ZETIA) 10 MG tablet; Take 1 tablet (10 mg total) by mouth daily.  Dispense: 90 tablet; Refill: 1   Memory deficit and insomnia- Discussion with pt and husband the need to dec ambein to 5mg  prn. And if possible can cut in half as needed.  Cont f/u with neuro for cognitive impairment and cont namenda.  hld- pt having pain with lipitor, will switch to zetia. Recheck labs on next visit.  Chronic low back pain-stable  tylenol or ultram prn.  F/6 mo or prn.

## 2020-05-09 ENCOUNTER — Other Ambulatory Visit: Payer: Self-pay

## 2020-05-09 ENCOUNTER — Inpatient Hospital Stay (HOSPITAL_COMMUNITY)
Admission: EM | Admit: 2020-05-09 | Discharge: 2020-05-11 | DRG: 282 | Disposition: A | Discharge status: Home / Self Care | Payer: Medicare Other | Attending: Internal Medicine | Admitting: Internal Medicine

## 2020-05-09 ENCOUNTER — Encounter (HOSPITAL_COMMUNITY): Payer: Self-pay

## 2020-05-09 ENCOUNTER — Emergency Department (HOSPITAL_COMMUNITY): Payer: Medicare Other

## 2020-05-09 DIAGNOSIS — Z7983 Long term (current) use of bisphosphonates: Secondary | ICD-10-CM

## 2020-05-09 DIAGNOSIS — I10 Essential (primary) hypertension: Secondary | ICD-10-CM | POA: Diagnosis present

## 2020-05-09 DIAGNOSIS — F0390 Unspecified dementia without behavioral disturbance: Secondary | ICD-10-CM | POA: Diagnosis present

## 2020-05-09 DIAGNOSIS — I2510 Atherosclerotic heart disease of native coronary artery without angina pectoris: Secondary | ICD-10-CM | POA: Diagnosis not present

## 2020-05-09 DIAGNOSIS — Z8673 Personal history of transient ischemic attack (TIA), and cerebral infarction without residual deficits: Secondary | ICD-10-CM

## 2020-05-09 DIAGNOSIS — I214 Non-ST elevation (NSTEMI) myocardial infarction: Secondary | ICD-10-CM | POA: Diagnosis present

## 2020-05-09 DIAGNOSIS — E785 Hyperlipidemia, unspecified: Secondary | ICD-10-CM | POA: Diagnosis present

## 2020-05-09 DIAGNOSIS — Z20822 Contact with and (suspected) exposure to covid-19: Secondary | ICD-10-CM | POA: Diagnosis present

## 2020-05-09 DIAGNOSIS — Z23 Encounter for immunization: Secondary | ICD-10-CM

## 2020-05-09 DIAGNOSIS — Z87891 Personal history of nicotine dependence: Secondary | ICD-10-CM

## 2020-05-09 DIAGNOSIS — Z79899 Other long term (current) drug therapy: Secondary | ICD-10-CM

## 2020-05-09 DIAGNOSIS — M810 Age-related osteoporosis without current pathological fracture: Secondary | ICD-10-CM | POA: Diagnosis not present

## 2020-05-09 DIAGNOSIS — Z8249 Family history of ischemic heart disease and other diseases of the circulatory system: Secondary | ICD-10-CM | POA: Diagnosis not present

## 2020-05-09 DIAGNOSIS — Z8711 Personal history of peptic ulcer disease: Secondary | ICD-10-CM | POA: Diagnosis not present

## 2020-05-09 DIAGNOSIS — Z7902 Long term (current) use of antithrombotics/antiplatelets: Secondary | ICD-10-CM | POA: Diagnosis not present

## 2020-05-09 DIAGNOSIS — R079 Chest pain, unspecified: Secondary | ICD-10-CM | POA: Diagnosis not present

## 2020-05-09 DIAGNOSIS — E782 Mixed hyperlipidemia: Secondary | ICD-10-CM | POA: Diagnosis not present

## 2020-05-09 DIAGNOSIS — R0789 Other chest pain: Secondary | ICD-10-CM

## 2020-05-09 DIAGNOSIS — I5181 Takotsubo syndrome: Secondary | ICD-10-CM | POA: Diagnosis not present

## 2020-05-09 DIAGNOSIS — R0602 Shortness of breath: Secondary | ICD-10-CM

## 2020-05-09 DIAGNOSIS — Z888 Allergy status to other drugs, medicaments and biological substances status: Secondary | ICD-10-CM

## 2020-05-09 DIAGNOSIS — Z83438 Family history of other disorder of lipoprotein metabolism and other lipidemia: Secondary | ICD-10-CM

## 2020-05-09 DIAGNOSIS — K449 Diaphragmatic hernia without obstruction or gangrene: Secondary | ICD-10-CM | POA: Diagnosis present

## 2020-05-09 LAB — CBC WITH DIFFERENTIAL/PLATELET
Abs Immature Granulocytes: 0.03 10*3/uL (ref 0.00–0.07)
Basophils Absolute: 0.1 10*3/uL (ref 0.0–0.1)
Basophils Relative: 1 %
Eosinophils Absolute: 0.1 10*3/uL (ref 0.0–0.5)
Eosinophils Relative: 1 %
HCT: 40.8 % (ref 36.0–46.0)
Hemoglobin: 13.3 g/dL (ref 12.0–15.0)
Immature Granulocytes: 0 %
Lymphocytes Relative: 14 %
Lymphs Abs: 1.3 10*3/uL (ref 0.7–4.0)
MCH: 29.9 pg (ref 26.0–34.0)
MCHC: 32.6 g/dL (ref 30.0–36.0)
MCV: 91.7 fL (ref 80.0–100.0)
Monocytes Absolute: 0.7 10*3/uL (ref 0.1–1.0)
Monocytes Relative: 7 %
Neutro Abs: 7.1 10*3/uL (ref 1.7–7.7)
Neutrophils Relative %: 77 %
Platelets: 310 10*3/uL (ref 150–400)
RBC: 4.45 MIL/uL (ref 3.87–5.11)
RDW: 13.5 % (ref 11.5–15.5)
WBC: 9.3 10*3/uL (ref 4.0–10.5)
nRBC: 0 % (ref 0.0–0.2)

## 2020-05-09 LAB — URINALYSIS, ROUTINE W REFLEX MICROSCOPIC
Bilirubin Urine: NEGATIVE
Glucose, UA: NEGATIVE mg/dL
Hgb urine dipstick: NEGATIVE
Ketones, ur: 5 mg/dL — AB
Nitrite: NEGATIVE
Protein, ur: NEGATIVE mg/dL
Specific Gravity, Urine: 1.02 (ref 1.005–1.030)
WBC, UA: >50 WBC/hpf — ABNORMAL HIGH (ref 0–5)
pH: 5 (ref 5.0–8.0)

## 2020-05-09 LAB — COMPREHENSIVE METABOLIC PANEL
ALT: 15 U/L (ref 0–44)
AST: 22 U/L (ref 15–41)
Albumin: 4.1 g/dL (ref 3.5–5.0)
Alkaline Phosphatase: 47 U/L (ref 38–126)
Anion gap: 10 (ref 5–15)
BUN: 18 mg/dL (ref 8–23)
CO2: 25 mmol/L (ref 22–32)
Calcium: 9 mg/dL (ref 8.9–10.3)
Chloride: 102 mmol/L (ref 98–111)
Creatinine, Ser: 0.85 mg/dL (ref 0.44–1.00)
GFR calc non Af Amer: >60 mL/min (ref >60)
Glucose, Bld: 115 mg/dL — ABNORMAL HIGH (ref 70–99)
Potassium: 3.8 mmol/L (ref 3.5–5.1)
Sodium: 137 mmol/L (ref 135–145)
Total Bilirubin: 0.4 mg/dL (ref 0.3–1.2)
Total Protein: 7 g/dL (ref 6.5–8.1)

## 2020-05-09 LAB — TROPONIN I (HIGH SENSITIVITY): Troponin I (High Sensitivity): 2312 ng/L (ref <18)

## 2020-05-09 MED ORDER — HEPARIN (PORCINE) 25000 UT/250ML-% IV SOLN
650.0000 [IU]/h | INTRAVENOUS | Status: DC
  Administered 2020-05-09: 650 [IU]/h via INTRAVENOUS
  Filled 2020-05-09: qty 250

## 2020-05-09 MED ORDER — ASPIRIN 81 MG PO CHEW
324.0000 mg | CHEWABLE_TABLET | Freq: Once | ORAL | Status: AC
  Administered 2020-05-09: 324 mg via ORAL
  Filled 2020-05-09: qty 4

## 2020-05-09 MED ORDER — HEPARIN BOLUS VIA INFUSION
2500.0000 [IU] | Freq: Once | INTRAVENOUS | Status: AC
  Administered 2020-05-09: 2500 [IU] via INTRAVENOUS

## 2020-05-09 NOTE — ED Notes (Signed)
Date and time results received: 05/09/20 2248 Test: troponin Critical Value: 2312  Name of Provider Notified: Irene Shipper, PA  Orders Received? Or Actions Taken?: Actions Taken: n/a

## 2020-05-09 NOTE — ED Notes (Signed)
Entered room and introduced self to patient and explained role in the plan of care. Pt appears to be resting in bed with no obvious signs of distress noted. Bed is locked in the lowest position, side rails x2, call bell within reach. All questions and concerns voiced addressed by this RN at this time. Pt educated on hourly rounding and call light at this time.

## 2020-05-09 NOTE — ED Notes (Signed)
Lab at bedside at this time.  

## 2020-05-09 NOTE — ED Notes (Signed)
X Ray at bedside at this time.  

## 2020-05-09 NOTE — Progress Notes (Signed)
ANTICOAGULATION CONSULT NOTE - Initial Consult  Pharmacy Consult for heparin Indication: chest pain/ACS  Allergies  Allergen Reactions  . Methergine [Methylergonovine Maleate] Other (See Comments)    Unknown    Patient Measurements: Height: 5\' 4"  (162.6 cm) Weight: 52.2 kg (115 lb) IBW/kg (Calculated) : 54.7 Heparin Dosing Weight: 52.2 kg  Vital Signs: Temp: 98.7 F (37.1 C) (10/06 2017) Temp Source: Oral (10/06 2017) BP: 137/77 (10/06 2017) Pulse Rate: 82 (10/06 2017)  Labs: Recent Labs    05/09/20 2155  HGB 13.3  HCT 40.8  PLT 310  CREATININE 0.85  TROPONINIHS 2,312*    Estimated Creatinine Clearance: 45 mL/min (by C-G formula based on SCr of 0.85 mg/dL).   Medical History: Past Medical History:  Diagnosis Date  . Anemia   . Collapse, lung   . Complication of anesthesia    very sensitive to anesthesia  . Hyperlipidemia   . Osteoporosis   . PUD (peptic ulcer disease)   . Spontaneous pneumothorax     Medications:  See medication history  Assessment: 79 yo lady with CP to start heparin.  She was not on anticoagulation PTA.  Hg 13.3, PTLC 310 Goal of Therapy:  Heparin level 0.3-0.7 units/ml Monitor platelets by anticoagulation protocol: Yes   Plan:  Heparin 2500 unit bolus and drip at 650 units/hr Check heparin level 6-8 hours after start Daily HL and CBC Monitor for bleeding complications  Excell Seltzer Poteet 05/09/2020,11:04 PM

## 2020-05-09 NOTE — ED Provider Notes (Signed)
Medical screening examination/treatment/procedure(s) were conducted as a shared visit with non-physician practitioner(s) and myself.  I personally evaluated the patient during the encounter.  EKG Interpretation  Date/Time:  Wednesday May 09 2020 22:07:29 EDT Ventricular Rate:  82 PR Interval:  142 QRS Duration: 76 QT Interval:  372 QTC Calculation: 434 R Axis:   63 Text Interpretation: Normal sinus rhythm Possible Left atrial enlargement Borderline ECG Confirmed by Fredia Sorrow [phone]) on 05/09/2020 10:51:16 PM   Patient with acute onset of left anterior chest pain radiating to the shoulder and then around to the top of the shoulder to the towards the back.  This occurred about 30 minutes prior to arrival.  Patient was concerned about having a pneumothorax.  But she has had in the past.  Chest x-ray here today is negative.  Labs without any significant abnormalities include liver function test.  However patient's first troponin was in the 2000 range.  Patient nontoxic no acute distress.  Do not think clinically the bed where she is presenting this has anything to do with dissection.  EKG had no acute changes may be some subtle ST changes in V4 V5.  We will call cardiology will we will start heparin.  Patient was on Plavix in the past.  But from recent primary care visit is not clear whether she is still on that or not.  Will give aspirin.  Medically based on the presentation is consistent with a non-STEMI.  Calling cardiology for consultation at [place], [place], MD 05/09/20 2307

## 2020-05-09 NOTE — ED Provider Notes (Signed)
[facility] EMERGENCY DEPARTMENT Provider Note   CSN: [id number] Arrival date & time: 05/09/20  2010     History Chief Complaint  Patient presents with  . Shortness of Breath    Jeanne Rogers is a 79 y.o. female with past medical history significant for anemia, spontaneous pneumothorax, osteoporosis, PUD, hyperlipidemia.  Plavix is listed in her home medications. Patient does not remember if she is currently taking it, last filled 02/2020.  HPI Patient presents to emergency department today with chief complaint of shortness of breath and chest pain.  That was acute starting 30 minutes prior to arrival.  Patient states she was sitting in the chair and she had sudden onset of shortness of breath.  She had chest pain on the left side of her chest that radiates to her shoulder and then to her back.  She describes the pain as a soreness.  She rates the pain 7 out of 10 in severity.  No medications for symptoms prior to arrival.  Denies any fever, chills, cough, hemoptysis, abdominal pain, nausea, urinary symptoms, diarrhea.  Patient is full code.    Past Medical History:  Diagnosis Date  . Anemia   . Collapse, lung   . Complication of anesthesia    very sensitive to anesthesia  . Hyperlipidemia   . Osteoporosis   . PUD (peptic ulcer disease)   . Spontaneous pneumothorax     Patient Active Problem List   Diagnosis Date Noted  . NSTEMI (non-ST elevated myocardial infarction) ([place]) 05/09/2020  . Mild cognitive impairment 10/11/2019  . UTI (urinary tract infection) 08/26/2019  . Hyperlipidemia 08/26/2019  . Generalized weakness 08/26/2019  . 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 Anemia, iron deficiency 01/15/2014  . Postural imbalance 01/12/2014  . Low back pain 10/13/2013  . Compression fracture 07/26/2013  . Vitamin D deficiency 07/26/2013  . Osteoporosis 06/29/2013  . Back pain 06/29/2013    Past Surgical History:  Procedure Laterality Date  . COLONOSCOPY N/A 01/04/2014   Procedure:  COLONOSCOPY;  Surgeon: Rogene Houston, MD;  Location: [facility];  Service: Endoscopy;  Laterality: N/A;  200  . ESOPHAGOGASTRODUODENOSCOPY N/A 01/04/2014   Procedure: ESOPHAGOGASTRODUODENOSCOPY (EGD);  Surgeon: Rogene Houston, MD;  Location: [facility];  Service: Endoscopy;  Laterality: N/A;  . hysterectomy    . MASS EXCISION Right 02/07/2016   Procedure: EXCISION MASS RIGHT INDEX FINGER AND RIGHT HAND MASS;  Surgeon: Leanora Cover, MD;  Location: [place];  Service: Orthopedics;  Laterality: Right;  . OOPHORECTOMY Bilateral 2005     OB History   No obstetric history on file.     Family History  Problem Relation Age of Onset  . Hyperlipidemia Mother   . Hypertension Father     Social History   Tobacco Use  . Smoking status: Former Smoker    Types: Cigarettes  . Smokeless tobacco: Never Used  Substance Use Topics  . Alcohol use: No  . Drug use: No    Home Medications Prior to Admission medications   Medication Sig Start Date End Date Taking? Authorizing Provider  alendronate (FOSAMAX) 70 MG tablet TAKE 1 TABLET BY MOUTH EVERY WEEK 12/26/19  Yes Mikey Kirschner, MD  amLODipine (NORVASC) 2.5 MG tablet Take 2.5 mg by mouth at bedtime.   Yes [provider]  citalopram (CELEXA) 10 MG tablet Take 1 tablet (10 mg total) by mouth daily. 01/10/20  Yes Suzzanne Cloud, NP  clopidogrel (PLAVIX) 75 MG tablet TAKE 1  TABLET(75 MG) BY MOUTH DAILY 02/25/20  Yes Lovena Le, Malena M, DO  ezetimibe (ZETIA) 10 MG tablet Take 1 tablet (10 mg total) by mouth daily. 04/16/20  Yes Lovena Le, Malena M, DO  memantine (NAMENDA) 10 MG tablet Take 1 tablet (10 mg total) by mouth 2 (two) times daily. 10/11/19  Yes Marcial Pacas, MD  traMADol (ULTRAM) 50 MG tablet Take 1 tablet (50 mg total) by mouth every 6 (six) hours as needed for moderate pain. 04/16/20  Yes Taylor, Malena M, DO  zolpidem (AMBIEN) 5 MG tablet TAKE 1 TABLET(10 MG) BY MOUTH AT BEDTIME AS NEEDED FOR SLEEP 04/16/20  Yes Lovena Le,  Malena M, DO  Multiple Vitamin (MULTIVITAMIN) tablet Take 1 tablet by mouth daily.    [provider]    Allergies    Methergine [methylergonovine maleate]  Review of Systems   Review of Systems  All other systems are reviewed and are negative for acute change except as noted in the HPI.   Physical Exam Updated Vital Signs BP 137/77 (BP Location: Right Arm)   Pulse 82   Temp 98.7 F (37.1 C) (Oral)   Resp 18   Ht 5\' 4"  (1.626 m)   Wt 52.2 kg   SpO2 100%   BMI 19.74 kg/m   Physical Exam Vitals and nursing note reviewed.  Constitutional:      General: She is not in acute distress.    Appearance: She is not ill-appearing.  HENT:     Head: Normocephalic and atraumatic.     Right Ear: Tympanic membrane and external ear normal.     Left Ear: Tympanic membrane and external ear normal.     Nose: Nose normal.     Mouth/Throat:     Mouth: Mucous membranes are moist.     Pharynx: Oropharynx is clear.  Eyes:     General: No scleral icterus.       Right eye: No discharge.        Left eye: No discharge.     Extraocular Movements: Extraocular movements intact.     Conjunctiva/sclera: Conjunctivae normal.     Pupils: Pupils are equal, round, and reactive to light.  Neck:     Vascular: No JVD.  Cardiovascular:     Rate and Rhythm: Normal rate and regular rhythm.     Pulses: Normal pulses.          Radial pulses are 2+ on the right side and 2+ on the left side.     Heart sounds: Normal heart sounds.  Pulmonary:     Comments: Lungs clear to auscultation in all fields. Symmetric chest rise. No wheezing, rales, or rhonchi. Chest:     Chest wall: No tenderness.  Abdominal:     Comments: Abdomen is soft, non-distended, and non-tender in all quadrants. No rigidity, no guarding. No peritoneal signs.  Musculoskeletal:        General: Normal range of motion.     Cervical back: Normal range of motion.  Skin:    General: Skin is warm and dry.     Capillary Refill: Capillary  refill takes less than 2 seconds.  Neurological:     Mental Status: She is oriented to person, place, and time.     GCS: GCS eye subscore is 4. GCS verbal subscore is 5. GCS motor subscore is 6.     Comments: Fluent speech, no facial droop.  Psychiatric:        Behavior: Behavior normal.     ED  Results / Procedures / Treatments   Labs (all labs ordered are listed, but only abnormal results are displayed) Labs Reviewed  COMPREHENSIVE METABOLIC PANEL - Abnormal; Notable for the following components:      Result Value   Glucose, Bld 115 (*)    All other components within normal limits  URINALYSIS, ROUTINE W REFLEX MICROSCOPIC - Abnormal; Notable for the following components:   APPearance HAZY (*)    Ketones, ur 5 (*)    Leukocytes,Ua LARGE (*)    WBC, UA >50 (*)    Bacteria, UA RARE (*)    Non Squamous Epithelial 0-5 (*)    All other components within normal limits  TROPONIN I (HIGH SENSITIVITY) - Abnormal; Notable for the following components:   Troponin I (High Sensitivity) 2,312 (*)    All other components within normal limits  URINE CULTURE  RESP PANEL BY RT PCR (RSV, FLU A&B, COVID)  CBC WITH DIFFERENTIAL/PLATELET  HEPARIN LEVEL (UNFRACTIONATED)  TROPONIN I (HIGH SENSITIVITY)    EKG EKG Interpretation  Date/Time:  Wednesday May 09 2020 22:07:29 EDT Ventricular Rate:  82 PR Interval:  142 QRS Duration: 76 QT Interval:  372 QTC Calculation: 434 R Axis:   63 Text Interpretation: Normal sinus rhythm Possible Left atrial enlargement Borderline ECG Confirmed by Fredia Sorrow [phone]) on 05/09/2020 10:51:16 PM   Radiology DG Chest Port 1 View  Result Date: 05/09/2020 CLINICAL DATA:  Chest pain EXAM: PORTABLE CHEST 1 VIEW COMPARISON:  08/25/2019, 01/25/2004 FINDINGS: Chronic apical pleural and parenchymal scarring. Hyperinflation with diffuse bronchitic changes. No acute consolidation or effusion. Slightly enlarged cardiomediastinal silhouette. Moderate hiatal  hernia. No pneumothorax. Aortic atherosclerosis. IMPRESSION: No active disease. Hyperinflation with chronic bronchitic changes. Moderate hiatal hernia. Electronically Signed   By: Donavan Foil M.D.   On: 05/09/2020 21:04    Procedures .Critical Care Performed by: Cherre Robins, PA-C Authorized by: Cherre Robins, PA-C   Critical care provider statement:    Critical care time (minutes):  35   Critical care was necessary to treat or prevent imminent or life-threatening deterioration of the following conditions:  Cardiac failure   Critical care was time spent personally by me on the following activities:  Development of treatment plan with patient or surrogate, discussions with consultants, evaluation of patient's response to treatment, examination of patient, obtaining history from patient or surrogate, ordering and performing treatments and interventions, ordering and review of laboratory studies, ordering and review of radiographic studies, pulse oximetry, re-evaluation of patient's condition and review of old charts   I assumed direction of critical care for this patient from another provider in my specialty: no     (including critical care time)  Medications Ordered in ED Medications  heparin ADULT infusion 100 units/mL (25000 units/261mL sodium chloride 0.45%) (650 Units/hr Intravenous New Bag/Given 05/09/20 2319)  aspirin chewable tablet 324 mg (324 mg Oral Given 05/09/20 2308)  heparin bolus via infusion 2,500 Units (2,500 Units Intravenous Bolus from Bag 05/09/20 2319)    ED Course  I have reviewed the triage vital signs and the nursing notes.  Pertinent labs & imaging results that were available during my care of the patient were reviewed by me and considered in my medical decision making (see chart for details).    MDM Rules/Calculators/A&P                          History provided by patient with additional history obtained from chart review.  79 year old female  presenting with shortness of breath and chest pain.  She is very well-appearing.  She has no acute distress.  Vital signs are stable.  She has history of nontender pneumothorax, no evidence of that on chest x-ray.  Lungs are clear to auscultation in all fields. Labs are significant for troponin of 2312.  EKG without STEMI.  Will give aspirin and order heparin per pharmacy consult.  Patient has Plavix listed as medication in her chart.  It used to be last filled in July 2021.  She had a PCP note from last month that does not mention Plavix, patient does not member if she is taking it or not. Unremarkable CBC and CMP.  UA with possible infection as it has large leukocytes and over 50 WBC.  Patient has no urinary symptoms.  Will send for urine culture. Patient has also been seen by ED attending Dr. Rogene Houston.  Consulted cardiology and case discussed with resident Dr. Paticia Stack. Patient to be admitted to [facility]. Accepting physician will be Dr. Gasper Sells. Dr. Paticia Stack asked to be paged on patient's arrival to [facility].   Portions of this note were generated with [employer]. Dictation errors may occur despite best attempts at proofreading.    Final Clinical Impression(s) / ED Diagnoses Final diagnoses:  NSTEMI (non-ST elevated myocardial infarction) [facility])    Rx / [place] Orders ED Discharge Orders    None       Cherre Robins, PA-C 05/09/20 2347    Fredia Sorrow, MD 05/11/20 1538

## 2020-05-09 NOTE — ED Triage Notes (Signed)
Pt to er, pt states that she has a hx of a pneumothorax, states that she is here because she feels like she has another one, pt c/o chest pain radiating into her back.

## 2020-05-10 ENCOUNTER — Inpatient Hospital Stay (HOSPITAL_COMMUNITY): Payer: Medicare Other

## 2020-05-10 ENCOUNTER — Encounter (HOSPITAL_COMMUNITY)
Admission: EM | Disposition: A | Discharge status: Home / Self Care | Payer: Self-pay | Source: Home / Self Care | Attending: Internal Medicine

## 2020-05-10 ENCOUNTER — Encounter (HOSPITAL_COMMUNITY): Payer: Self-pay | Admitting: Interventional Cardiology

## 2020-05-10 DIAGNOSIS — R0602 Shortness of breath: Secondary | ICD-10-CM

## 2020-05-10 DIAGNOSIS — R079 Chest pain, unspecified: Secondary | ICD-10-CM | POA: Diagnosis not present

## 2020-05-10 DIAGNOSIS — I214 Non-ST elevation (NSTEMI) myocardial infarction: Secondary | ICD-10-CM | POA: Diagnosis not present

## 2020-05-10 DIAGNOSIS — I5181 Takotsubo syndrome: Secondary | ICD-10-CM | POA: Diagnosis not present

## 2020-05-10 DIAGNOSIS — R0789 Other chest pain: Secondary | ICD-10-CM

## 2020-05-10 HISTORY — PX: LEFT HEART CATH AND CORONARY ANGIOGRAPHY: CATH118249

## 2020-05-10 LAB — LIPID PANEL
Cholesterol: 231 mg/dL — ABNORMAL HIGH (ref 0–200)
HDL: 71 mg/dL (ref >40)
LDL Cholesterol: 139 mg/dL — ABNORMAL HIGH (ref 0–99)
Total CHOL/HDL Ratio: 3.3 RATIO
Triglycerides: 106 mg/dL (ref <150)
VLDL: 21 mg/dL (ref 0–40)

## 2020-05-10 LAB — CBC
HCT: 38.5 % (ref 36.0–46.0)
Hemoglobin: 12.5 g/dL (ref 12.0–15.0)
MCH: 30 pg (ref 26.0–34.0)
MCHC: 32.5 g/dL (ref 30.0–36.0)
MCV: 92.3 fL (ref 80.0–100.0)
Platelets: 270 10*3/uL (ref 150–400)
RBC: 4.17 MIL/uL (ref 3.87–5.11)
RDW: 13.8 % (ref 11.5–15.5)
WBC: 8.1 10*3/uL (ref 4.0–10.5)
nRBC: 0 % (ref 0.0–0.2)

## 2020-05-10 LAB — TROPONIN I (HIGH SENSITIVITY)
Troponin I (High Sensitivity): 4288 ng/L (ref <18)
Troponin I (High Sensitivity): 1914 ng/L (ref <18)
Troponin I (High Sensitivity): 1571 ng/L (ref <18)

## 2020-05-10 LAB — RESP PANEL BY RT PCR (RSV, FLU A&B, COVID)
Influenza A by PCR: NEGATIVE
Influenza B by PCR: NEGATIVE
Respiratory Syncytial Virus by PCR: NEGATIVE
SARS Coronavirus 2 by RT PCR: NEGATIVE

## 2020-05-10 LAB — HEMOGLOBIN A1C
Hgb A1c MFr Bld: 5.4 % (ref 4.8–5.6)
Mean Plasma Glucose: 108.28 mg/dL

## 2020-05-10 LAB — ECHOCARDIOGRAM COMPLETE
Area-P 1/2: 4.39 cm2
Height: 64 in
S' Lateral: 3.18 cm
Single Plane A4C EF: 37.5 %
Weight: 1854.4 oz

## 2020-05-10 LAB — CREATININE, SERUM
Creatinine, Ser: 0.75 mg/dL (ref 0.44–1.00)
GFR calc non Af Amer: >60 mL/min (ref >60)

## 2020-05-10 LAB — HEPARIN LEVEL (UNFRACTIONATED): Heparin Unfractionated: 0.56 IU/mL (ref 0.30–0.70)

## 2020-05-10 SURGERY — LEFT HEART CATH AND CORONARY ANGIOGRAPHY
Anesthesia: LOCAL

## 2020-05-10 MED ORDER — ONDANSETRON HCL 4 MG/2ML IJ SOLN: 4.0000 mg | Freq: Four times a day (QID) | INTRAMUSCULAR | Status: DC | PRN

## 2020-05-10 MED ORDER — SODIUM CHLORIDE 0.9 % IV SOLN: 250.0000 mL | INTRAVENOUS | Status: DC | PRN

## 2020-05-10 MED ORDER — HEPARIN (PORCINE) IN NACL 1000-0.9 UT/500ML-% IV SOLN
INTRAVENOUS | Status: DC | PRN
  Administered 2020-05-10: 500 mL

## 2020-05-10 MED ORDER — AMLODIPINE BESYLATE 2.5 MG PO TABS
2.5000 mg | ORAL_TABLET | Freq: Every day | ORAL | Status: DC
  Administered 2020-05-10: 2.5 mg via ORAL
  Filled 2020-05-10: qty 1

## 2020-05-10 MED ORDER — SODIUM CHLORIDE 0.9% FLUSH: 3.0000 mL | INTRAVENOUS | Status: DC | PRN

## 2020-05-10 MED ORDER — NITROGLYCERIN IN D5W 200-5 MCG/ML-% IV SOLN
0.0000 ug/min | INTRAVENOUS | Status: DC
  Administered 2020-05-10: 5 ug/min via INTRAVENOUS
  Filled 2020-05-10: qty 250

## 2020-05-10 MED ORDER — MIDAZOLAM HCL 2 MG/2ML IJ SOLN
INTRAMUSCULAR | Status: DC | PRN
  Administered 2020-05-10: 1 mg via INTRAVENOUS

## 2020-05-10 MED ORDER — NITROGLYCERIN 0.4 MG SL SUBL
SUBLINGUAL_TABLET | SUBLINGUAL | Status: AC
  Administered 2020-05-10: 0.4 mg
  Filled 2020-05-10: qty 1

## 2020-05-10 MED ORDER — SODIUM CHLORIDE 0.9% FLUSH
3.0000 mL | Freq: Two times a day (BID) | INTRAVENOUS | Status: DC
  Administered 2020-05-11: 3 mL via INTRAVENOUS

## 2020-05-10 MED ORDER — SODIUM CHLORIDE 0.9% FLUSH: 3.0000 mL | Freq: Two times a day (BID) | INTRAVENOUS | Status: DC

## 2020-05-10 MED ORDER — INFLUENZA VAC A&B SA ADJ QUAD 0.5 ML IM PRSY
0.5000 mL | PREFILLED_SYRINGE | INTRAMUSCULAR | Status: AC
  Administered 2020-05-11: 0.5 mL via INTRAMUSCULAR
  Filled 2020-05-10: qty 0.5

## 2020-05-10 MED ORDER — FENTANYL CITRATE (PF) 100 MCG/2ML IJ SOLN
INTRAMUSCULAR | Status: DC | PRN
Start: 2020-05-10 — End: 2020-05-10
  Administered 2020-05-10: 50 ug via INTRAVENOUS

## 2020-05-10 MED ORDER — MIDAZOLAM HCL 2 MG/2ML IJ SOLN
INTRAMUSCULAR | Status: AC
  Filled 2020-05-10: qty 2

## 2020-05-10 MED ORDER — ASPIRIN EC 81 MG PO TBEC
81.0000 mg | DELAYED_RELEASE_TABLET | Freq: Every day | ORAL | Status: DC
  Filled 2020-05-10: qty 1

## 2020-05-10 MED ORDER — SODIUM CHLORIDE 0.9 % IV SOLN: INTRAVENOUS | Status: DC

## 2020-05-10 MED ORDER — HEPARIN (PORCINE) IN NACL 1000-0.9 UT/500ML-% IV SOLN
INTRAVENOUS | Status: AC
  Filled 2020-05-10: qty 1000

## 2020-05-10 MED ORDER — FENTANYL CITRATE (PF) 100 MCG/2ML IJ SOLN
INTRAMUSCULAR | Status: AC
  Filled 2020-05-10: qty 2

## 2020-05-10 MED ORDER — SODIUM CHLORIDE 0.9 % IV SOLN: INTRAVENOUS | Status: AC

## 2020-05-10 MED ORDER — HEPARIN SODIUM (PORCINE) 5000 UNIT/ML IJ SOLN
5000.0000 [IU] | Freq: Three times a day (TID) | INTRAMUSCULAR | Status: DC
  Administered 2020-05-10 – 2020-05-11 (×2): 5000 [IU] via SUBCUTANEOUS
  Filled 2020-05-10 (×2): qty 1

## 2020-05-10 MED ORDER — LIDOCAINE HCL (PF) 1 % IJ SOLN
INTRAMUSCULAR | Status: AC
  Filled 2020-05-10: qty 30

## 2020-05-10 MED ORDER — ASPIRIN 81 MG PO CHEW: 81.0000 mg | CHEWABLE_TABLET | ORAL | Status: DC

## 2020-05-10 MED ORDER — HYDRALAZINE HCL 20 MG/ML IJ SOLN: 10.0000 mg | INTRAMUSCULAR | Status: AC | PRN

## 2020-05-10 MED ORDER — HEPARIN SODIUM (PORCINE) 1000 UNIT/ML IJ SOLN
INTRAMUSCULAR | Status: AC
  Filled 2020-05-10: qty 1

## 2020-05-10 MED ORDER — ACETAMINOPHEN 325 MG PO TABS: 650.0000 mg | ORAL_TABLET | ORAL | Status: DC | PRN

## 2020-05-10 MED ORDER — VERAPAMIL HCL 2.5 MG/ML IV SOLN
INTRAVENOUS | Status: AC
  Filled 2020-05-10: qty 2

## 2020-05-10 MED ORDER — LIDOCAINE HCL (PF) 1 % IJ SOLN
INTRAMUSCULAR | Status: DC | PRN
  Administered 2020-05-10: 2 mL

## 2020-05-10 MED ORDER — LABETALOL HCL 5 MG/ML IV SOLN: 10.0000 mg | INTRAVENOUS | Status: AC | PRN

## 2020-05-10 MED ORDER — EZETIMIBE 10 MG PO TABS
10.0000 mg | ORAL_TABLET | Freq: Every day | ORAL | Status: DC
  Administered 2020-05-10 – 2020-05-11 (×2): 10 mg via ORAL
  Filled 2020-05-10 (×2): qty 1

## 2020-05-10 MED ORDER — HEPARIN SODIUM (PORCINE) 1000 UNIT/ML IJ SOLN
INTRAMUSCULAR | Status: DC | PRN
  Administered 2020-05-10: 2500 [IU] via INTRAVENOUS

## 2020-05-10 MED ORDER — CITALOPRAM HYDROBROMIDE 10 MG PO TABS
10.0000 mg | ORAL_TABLET | Freq: Every day | ORAL | Status: DC
  Administered 2020-05-10 – 2020-05-11 (×2): 10 mg via ORAL
  Filled 2020-05-10 (×2): qty 1

## 2020-05-10 MED ORDER — OXYCODONE HCL 5 MG PO TABS
5.0000 mg | ORAL_TABLET | ORAL | Status: DC | PRN
  Administered 2020-05-10: 5 mg via ORAL
  Filled 2020-05-10: qty 1

## 2020-05-10 MED ORDER — MEMANTINE HCL 10 MG PO TABS
10.0000 mg | ORAL_TABLET | Freq: Two times a day (BID) | ORAL | Status: DC
  Administered 2020-05-10 – 2020-05-11 (×3): 10 mg via ORAL
  Filled 2020-05-10 (×3): qty 1

## 2020-05-10 MED ORDER — VERAPAMIL HCL 2.5 MG/ML IV SOLN
INTRAVENOUS | Status: DC | PRN
  Administered 2020-05-10: 10 mL via INTRA_ARTERIAL

## 2020-05-10 MED ORDER — NITROGLYCERIN 0.4 MG SL SUBL
0.4000 mg | SUBLINGUAL_TABLET | SUBLINGUAL | Status: DC | PRN
  Administered 2020-05-10: 0.4 mg via SUBLINGUAL
  Filled 2020-05-10 (×4): qty 1

## 2020-05-10 MED ORDER — ACETAMINOPHEN 325 MG PO TABS
650.0000 mg | ORAL_TABLET | ORAL | Status: DC | PRN
  Administered 2020-05-10: 650 mg via ORAL
  Filled 2020-05-10: qty 2

## 2020-05-10 SURGICAL SUPPLY — 10 items
CATH 5FR JL3.5 JR4 ANG PIG MP (CATHETERS) ×1 IMPLANT
DEVICE RAD COMP TR BAND LRG (VASCULAR PRODUCTS) ×1 IMPLANT
GLIDESHEATH SLEND A-KIT 6F 22G (SHEATH) ×1 IMPLANT
GUIDEWIRE INQWIRE 1.5J.035X260 (WIRE) IMPLANT
INQWIRE 1.5J .035X260CM (WIRE) ×2
KIT HEART LEFT (KITS) ×2 IMPLANT
PACK CARDIAC CATHETERIZATION (CUSTOM PROCEDURE TRAY) ×2 IMPLANT
SHEATH PROBE COVER 6X72 (BAG) ×1 IMPLANT
TRANSDUCER W/STOPCOCK (MISCELLANEOUS) ×2 IMPLANT
TUBING CIL FLEX 10 FLL-RA (TUBING) ×2 IMPLANT

## 2020-05-10 NOTE — H&P (Signed)
Cardiology Admission History and Physical:   Patient ID: Jeanne Rogers; MRN: [id number]; DOB: 1941/02/21   Admission date: 05/09/2020  Primary Care Provider: Erven Colla, DO Primary Cardiologist: No primary care provider on file.    Chief Complaint:  Chest pain  History of Present Illness:   Jeanne Rogers is a 79 y.o. female with a history of memory changes, prior TIA, hypertension, hyperlipidemia (statin intolerance), spontaneous pneumothorax, and osteoporosis who presented to the hospital with complaints of chest pain. The patient reported left-sided chest pain that it radiated to the shoulder and the back.  She denied any associated diaphoresis, nausea, vomiting or shortness of breath.  In the ED at [facility] the vitals were: Blood pressure 137/77 mmHg, heart rate 82 bpm, temperature 98.7 and O2 saturation 100%.  High-sensitivity troponins were 2312 and 4288. The ECG that was done 05/10/2020 (at 03:17) was personally reviewed and demonstrated sinus rhythm, rate 77 bpm, and T wave abnormalities in the lateral leads.  The CXR showed hyperinflation with chronic bronchitic changes. Moderate hiatal hernia  Pertinent labs were: Sodium 137, potassium 3.8, glucose 115, BUN 18, creatinine 0.85, WBC 9.3, hemoglobin 13.3, hematocrit 40.8 and platelets 310.   Previous cardiac studies Echocardiogram 08/25/2019 1. Left ventricular ejection fraction, by visual estimation, is 55 to  60%. The left ventricle has normal function. There is mildly increased  left ventricular hypertrophy.  2. Left ventricular diastolic parameters are consistent with Grade I  diastolic dysfunction (impaired relaxation).  3. The left ventricle has no regional wall motion abnormalities.  4. Global right ventricle has normal systolic function.The right  ventricular size is normal. No increase in right ventricular wall  thickness.  5. Left atrial size was normal.  6. Right atrial size was normal.    7. Mild mitral annular calcification.  8. The mitral valve is normal in structure. No evidence of mitral valve  regurgitation. No evidence of mitral stenosis.  9. The tricuspid valve is normal in structure. Tricuspid valve  regurgitation is trivial.  10. The aortic valve was not well visualized. Aortic valve regurgitation  is not visualized. No evidence of aortic valve sclerosis or stenosis.  11. The tricuspid regurgitant velocity is 2.06 m/s, and with an assumed  right atrial pressure of 3 mmHg, the estimated right ventricular systolic  pressure is normal at 20.0 mmHg.  12. The inferior vena cava is normal in size with greater than 50%  respiratory variability, suggesting right atrial pressure of 3 mmHg.    Past Medical History:  Diagnosis Date  . Anemia   . Collapse, lung   . Complication of anesthesia    very sensitive to anesthesia  . Hyperlipidemia   . Osteoporosis   . PUD (peptic ulcer disease)   . Spontaneous pneumothorax     Past Surgical History:  Procedure Laterality Date  . COLONOSCOPY N/A 01/04/2014   Procedure: COLONOSCOPY;  Surgeon: Rogene Houston, MD;  Location: [facility];  Service: Endoscopy;  Laterality: N/A;  200  . ESOPHAGOGASTRODUODENOSCOPY N/A 01/04/2014   Procedure: ESOPHAGOGASTRODUODENOSCOPY (EGD);  Surgeon: Rogene Houston, MD;  Location: [facility];  Service: Endoscopy;  Laterality: N/A;  . hysterectomy    . MASS EXCISION Right 02/07/2016   Procedure: EXCISION MASS RIGHT INDEX FINGER AND RIGHT HAND MASS;  Surgeon: Leanora Cover, MD;  Location: [facility];  Service: Orthopedics;  Laterality: Right;  . OOPHORECTOMY Bilateral 2005     Medications Prior to Admission: Prior to  Admission medications   Medication Sig Start Date End Date Taking? Authorizing Provider  alendronate (FOSAMAX) 70 MG tablet TAKE 1 TABLET BY MOUTH EVERY WEEK 12/26/19  Yes Mikey Kirschner, MD  amLODipine (NORVASC) 2.5 MG tablet Take 2.5 mg by mouth at bedtime.    Yes [provider]  citalopram (CELEXA) 10 MG tablet Take 1 tablet (10 mg total) by mouth daily. 01/10/20  Yes Suzzanne Cloud, NP  clopidogrel (PLAVIX) 75 MG tablet TAKE 1 TABLET(75 MG) BY MOUTH DAILY 02/25/20  Yes Lovena Le, Malena M, DO  ezetimibe (ZETIA) 10 MG tablet Take 1 tablet (10 mg total) by mouth daily. 04/16/20  Yes Lovena Le, Malena M, DO  memantine (NAMENDA) 10 MG tablet Take 1 tablet (10 mg total) by mouth 2 (two) times daily. 10/11/19  Yes Marcial Pacas, MD  traMADol (ULTRAM) 50 MG tablet Take 1 tablet (50 mg total) by mouth every 6 (six) hours as needed for moderate pain. 04/16/20  Yes Taylor, Malena M, DO  zolpidem (AMBIEN) 5 MG tablet TAKE 1 TABLET(10 MG) BY MOUTH AT BEDTIME AS NEEDED FOR SLEEP 04/16/20  Yes Lovena Le, Malena M, DO  Multiple Vitamin (MULTIVITAMIN) tablet Take 1 tablet by mouth daily.    [provider]     Allergies:    Allergies  Allergen Reactions  . Methergine [Methylergonovine Maleate] Other (See Comments)    Unknown    Social History:   Social History   Socioeconomic History  . Marital status: Married    Spouse name: Not on file  . Number of children: Not on file  . Years of education: Not on file  . Highest education level: Not on file  Occupational History  . Not on file  Tobacco Use  . Smoking status: Former Smoker    Types: Cigarettes  . Smokeless tobacco: Never Used  Substance and Sexual Activity  . Alcohol use: No  . Drug use: No  . Sexual activity: Not on file  Other Topics Concern  . Not on file  Social History Narrative  . Not on file   Social Determinants of Health   Financial Resource Strain:   . Difficulty of Paying Living Expenses: Not on file  Food Insecurity:   . Worried About Charity fundraiser in the Last Year: Not on file  . Ran Out of Food in the Last Year: Not on file  Transportation Needs:   . Lack of Transportation (Medical): Not on file  . Lack of Transportation (Non-Medical): Not on file  Physical  Activity:   . Days of Exercise per Week: Not on file  . Minutes of Exercise per Session: Not on file  Stress:   . Feeling of Stress : Not on file  Social Connections:   . Frequency of Communication with Friends and Family: Not on file  . Frequency of Social Gatherings with Friends and Family: Not on file  . Attends Religious Services: Not on file  . Active Member of Clubs or Organizations: Not on file  . Attends Archivist Meetings: Not on file  . Marital Status: Not on file  Intimate Partner Violence:   . Fear of Current or Ex-Partner: Not on file  . Emotionally Abused: Not on file  . Physically Abused: Not on file  . Sexually Abused: Not on file     Family History:   The patient's family history includes Hyperlipidemia in her mother; Hypertension in her father.     Review of Systems: [y] = yes, [ ]  =  no   . General: Weight gain [ ] ; Weight loss [ ] ; Anorexia [ ] ; Fatigue [ ] ; Fever [ ] ; Chills [ ] ; Weakness [ ]   . Cardiac: Chest pain/pressure [Y]; Resting SOB [ ] ; Exertional SOB [ ] ; Orthopnea [ ] ; Pedal Edema [ ] ; Palpitations [ ] ; Syncope [ ] ; Presyncope [ ] ; Paroxysmal nocturnal dyspnea[ ]   . Pulmonary: Cough [ ] ; Wheezing[ ] ; Hemoptysis[ ] ; Sputum [ ] ; Snoring [ ]   . GI: Vomiting[ ] ; Dysphagia[ ] ; Melena[ ] ; Hematochezia [ ] ; Heartburn[ ] ; Abdominal pain [ ] ; Constipation [ ] ; Diarrhea [ ] ; BRBPR [ ]   . GU: Hematuria[ ] ; Dysuria [ ] ; Nocturia[ ]   . Vascular: Pain in legs with walking [ ] ; Pain in feet with lying flat [ ] ; Non-healing sores [ ] ; Stroke [ ] ; TIA [ ] ; Slurred speech [ ] ;  . Neuro: Headaches[ ] ; Vertigo[ ] ; Seizures[ ] ; Paresthesias[ ] ;Blurred vision [ ] ; Diplopia [ ] ; Vision changes [ ]   . Ortho/Skin: Arthritis [ ] ; Joint pain [ ] ; Muscle pain [ ] ; Joint swelling [ ] ; Back Pain [ ] ; Rash [ ]   . Psych: Depression[ ] ; Anxiety[ ]   . Heme: Bleeding problems [ ] ; Clotting disorders [ ] ; Anemia [ ]   . Endocrine: Diabetes [ ] ; Thyroid dysfunction[ ]       Physical Exam/Data:   Vitals:   05/09/20 2317 05/09/20 2330 05/10/20 0304 05/10/20 0410  BP: 119/72 115/73 110/72 127/71  Pulse: 86 80 78 81  Resp: 16 (!) 21 (!) 21 15  Temp:    97.6 F (36.4 C)  TempSrc:    Oral  SpO2: 94% 94% 97% 98%  Weight:    52.6 kg  Height:    5\' 4"  (1.626 m)    Intake/Output Summary (Last 24 hours) at 05/10/2020 0511 Last data filed at 05/10/2020 0400 Gross per 24 hour  Intake --  Output 100 ml  Net -100 ml   Filed Weights   05/09/20 2018 05/10/20 0410  Weight: 52.2 kg 52.6 kg   Body mass index is 19.89 kg/m.  General:  Well nourished, well developed, in no acute distress HEENT: normal Lymph: no adenopathy Neck: no JVD Endocrine:  No thryomegaly Vascular: No carotid bruits; FA pulses 2+ bilaterally without bruits  Cardiac:  normal S1, S2; RRR; no murmur  Lungs:  clear to auscultation bilaterally, no wheezing, rhonchi or rales  Abd: soft, nontender, no hepatomegaly  Ext: no edema Musculoskeletal:  No deformities, BUE and BLE strength normal and equal Skin: warm and dry  Neuro:  CNs 2-12 intact, no focal abnormalities noted Psych:  Normal affect    EKG:  The ECG that was done 05/10/2020 (at 03:17) was personally reviewed and demonstrated sinus rhythm, rate 77 bpm, and T wave abnormalities in the lateral leads.  Laboratory Data:  Chemistry Recent Labs  Lab 05/09/20 2155  NA 137  K 3.8  CL 102  CO2 25  GLUCOSE 115*  BUN 18  CREATININE 0.85  CALCIUM 9.0  GFRNONAA >60  ANIONGAP 10    Recent Labs  Lab 05/09/20 2155  PROT 7.0  ALBUMIN 4.1  AST 22  ALT 15  ALKPHOS 47  BILITOT 0.4   Hematology Recent Labs  Lab 05/09/20 2155  WBC 9.3  RBC 4.45  HGB 13.3  HCT 40.8  MCV 91.7  MCH 29.9  MCHC 32.6  RDW 13.5  PLT 310   Cardiac EnzymesNo results for input(s): TROPONINI in the last 168 hours. No results for  input(s): TROPIPOC in the last 168 hours.  BNPNo results for input(s): BNP, PROBNP in the last 168 hours.  DDimer  No results for input(s): DDIMER in the last 168 hours.  Radiology/Studies:  DG Chest Port 1 View  Result Date: 05/09/2020 CLINICAL DATA:  Chest pain EXAM: PORTABLE CHEST 1 VIEW COMPARISON:  08/25/2019, 01/25/2004 FINDINGS: Chronic apical pleural and parenchymal scarring. Hyperinflation with diffuse bronchitic changes. No acute consolidation or effusion. Slightly enlarged cardiomediastinal silhouette. Moderate hiatal hernia. No pneumothorax. Aortic atherosclerosis. IMPRESSION: No active disease. Hyperinflation with chronic bronchitic changes. Moderate hiatal hernia. Electronically Signed   By: Donavan Foil M.D.   On: 05/09/2020 21:04    Assessment and Plan:   1. Non-ST elevation myocardial infarction  The patient presents to the hospital with typical chest pain symptoms.  The high-sensitivity troponins are abnormal and the electrocardiogram reveals nonspecific T wave abnormalities.  - Trend cardiac biomarkers - Serial ECGs - Aspirin 81 mg daily - Check a lipid panel in the morning. - Continue intravenous unfractionated heparin per pharmacy protocol - Obtain a transthoracic echocardiogram to evaluate LV systolic and diastolic function. -  Keep NPO  Recommend cardiac catheterization to define the coronary anatomy.   Severity of Illness: The appropriate patient status for this patient is INPATIENT. Inpatient status is judged to be reasonable and necessary in order to provide the required intensity of service to ensure the patient's safety. The patient's presenting symptoms, physical exam findings, and initial radiographic and laboratory data in the context of their chronic comorbidities is felt to place them at high risk for further clinical deterioration. Furthermore, it is not anticipated that the patient will be medically stable for discharge from the hospital within 2 midnights of admission. The following factors support the patient status of inpatient.   " The patient's presenting  symptoms include chest pain. " The worrisome physical exam findings include NA. " The initial radiographic and laboratory data are worrisome because of abnormal ECG. " The chronic co-morbidities include hypertension.   * I certify that at the point of admission it is my clinical judgment that the patient will require inpatient hospital care spanning beyond 2 midnights from the point of admission due to high intensity of service, high risk for further deterioration and high frequency of surveillance required.*    For questions or updates, please contact [facility] Please consult [email or web address] for contact info under Cardiology/STEMI.    Signed, Meade Maw, MD  05/10/2020 5:11 AM

## 2020-05-10 NOTE — Progress Notes (Signed)
Patient experiencing chest discomfort and tightness. Pain rated as 5/10. Nitroglycerin infusion initiated at 5 mcg/hr. Transported to Cath lab with tech and nurse present. CCMD notified. Will be monitored in procedure area. Patient experiencing urinary frequency. Output is 100 mL at a time.

## 2020-05-10 NOTE — H&P (Signed)
Data reviewed. Differential is Stress CM vs LAD obstructive disease.

## 2020-05-10 NOTE — ED Notes (Signed)
Report to [facility]

## 2020-05-10 NOTE — Interval H&P Note (Signed)
Cath Lab Visit (complete for each Cath Lab visit)  Clinical Evaluation Leading to the Procedure:   ACS: Yes.    Non-ACS:    Anginal Classification: CCS IV  Anti-ischemic medical therapy: Maximal Therapy (2 or more classes of medications)  Non-Invasive Test Results: No non-invasive testing performed  Prior CABG: No previous CABG      History and Physical Interval Note:  05/10/2020 10:44 AM  Jeanne Rogers  has presented today for surgery, with the diagnosis of nonstemi.  The various methods of treatment have been discussed with the patient and family. After consideration of risks, benefits and other options for treatment, the patient has consented to  Procedure(s): LEFT HEART CATH AND CORONARY ANGIOGRAPHY (N/A) as a surgical intervention.  The patient's history has been reviewed, patient examined, no change in status, stable for surgery.  I have reviewed the patient's chart and labs.  Questions were answered to the patient's satisfaction.     Jeanne Rogers

## 2020-05-10 NOTE — CV Procedure (Signed)
   Widely patent coronary arteries.  Hand-injection LV gram reveals apical ballooning with hypocontractility at the base of the heart.  EF 30%.  Consistent with stress cardiomyopathy.  LVEDP 6 mmHg.

## 2020-05-10 NOTE — Progress Notes (Signed)
  Echocardiogram 2D Echocardiogram has been performed.  Jeanne Rogers 05/10/2020, 9:17 AM

## 2020-05-10 NOTE — ED Notes (Signed)
Pt off unit via [facility]

## 2020-05-10 NOTE — Progress Notes (Signed)
Progress Note  Patient Name: Jeanne Rogers Date of Encounter: 05/10/2020  Primary Cardiologist: New to Dr. Debara Pickett  Subjective   Still with some chest tightness this morning. Was relieved with SL nitro around 5am. No complaints of SOB, palpitations, or LE edema. We discussed her hospital work-up in detail and plans for additional testing. All questions answered.   Inpatient Medications    Scheduled Meds: . amLODipine  2.5 mg Oral QHS  . [START ON 05/11/2020] aspirin EC  81 mg Oral Daily  . ezetimibe  10 mg Oral Daily  . [START ON 05/11/2020] influenza vaccine adjuvanted  0.5 mL Intramuscular Tomorrow-1000   Continuous Infusions: . heparin 650 Units/hr (05/09/20 2319)   PRN Meds: acetaminophen, nitroGLYCERIN, ondansetron (ZOFRAN) IV   Vital Signs    Vitals:   05/10/20 0410 05/10/20 0511 05/10/20 0521 05/10/20 0753  BP: 127/71 116/62 105/60 (!) 115/59  Pulse: 81   75  Resp: 15   17  Temp: 97.6 F (36.4 C)   97.8 F (36.6 C)  TempSrc: Oral   Oral  SpO2: 98%   99%  Weight: 52.6 kg     Height: 5\' 4"  (1.626 m)       Intake/Output Summary (Last 24 hours) at 05/10/2020 0900 Last data filed at 05/10/2020 0844 Gross per 24 hour  Intake 0 ml  Output 100 ml  Net -100 ml   Filed Weights   05/09/20 2018 05/10/20 0410  Weight: 52.2 kg 52.6 kg    Telemetry    Sinus rhythm - Personally Reviewed  ECG    Sinus rhythm with rate 77bpm and new TWI in lateral leads compared to yesterday  - Personally Reviewed  Physical Exam   GEN: No acute distress.   Neck: No JVD, no carotid bruits Cardiac: RRR, no murmurs, rubs, or gallops.  Respiratory: Clear to auscultation bilaterally, no wheezes/ rales/ rhonchi GI: NABS, Soft, nontender, non-distended  MS: No edema; No deformity. Neuro:  Nonfocal, moving all extremities spontaneously Psych: Normal affect   Labs    Chemistry Recent Labs  Lab 05/09/20 2155  NA 137  K 3.8  CL 102  CO2 25  GLUCOSE 115*  BUN 18    CREATININE 0.85  CALCIUM 9.0  PROT 7.0  ALBUMIN 4.1  AST 22  ALT 15  ALKPHOS 47  BILITOT 0.4  GFRNONAA >60  ANIONGAP 10     Hematology Recent Labs  Lab 05/09/20 2155  WBC 9.3  RBC 4.45  HGB 13.3  HCT 40.8  MCV 91.7  MCH 29.9  MCHC 32.6  RDW 13.5  PLT 310    Cardiac EnzymesNo results for input(s): TROPONINI in the last 168 hours. No results for input(s): TROPIPOC in the last 168 hours.   BNPNo results for input(s): BNP, PROBNP in the last 168 hours.   DDimer No results for input(s): DDIMER in the last 168 hours.   Radiology    DG Chest Port 1 View  Result Date: 05/09/2020 CLINICAL DATA:  Chest pain EXAM: PORTABLE CHEST 1 VIEW COMPARISON:  08/25/2019, 01/25/2004 FINDINGS: Chronic apical pleural and parenchymal scarring. Hyperinflation with diffuse bronchitic changes. No acute consolidation or effusion. Slightly enlarged cardiomediastinal silhouette. Moderate hiatal hernia. No pneumothorax. Aortic atherosclerosis. IMPRESSION: No active disease. Hyperinflation with chronic bronchitic changes. Moderate hiatal hernia. Electronically Signed   By: Donavan Foil M.D.   On: 05/09/2020 21:04    Cardiac Studies   Echocardiogram 08/25/2019 1. Left ventricular ejection fraction, by visual estimation, is 55 to  60%. The left ventricle has normal function. There is mildly increased  left ventricular hypertrophy.  2. Left ventricular diastolic parameters are consistent with Grade I  diastolic dysfunction (impaired relaxation).  3. The left ventricle has no regional wall motion abnormalities.  4. Global right ventricle has normal systolic function.The right  ventricular size is normal. No increase in right ventricular wall  thickness.  5. Left atrial size was normal.  6. Right atrial size was normal.  7. Mild mitral annular calcification.  8. The mitral valve is normal in structure. No evidence of mitral valve  regurgitation. No evidence of mitral stenosis.  9. The  tricuspid valve is normal in structure. Tricuspid valve  regurgitation is trivial.  10. The aortic valve was not well visualized. Aortic valve regurgitation  is not visualized. No evidence of aortic valve sclerosis or stenosis.  11. The tricuspid regurgitant velocity is 2.06 m/s, and with an assumed  right atrial pressure of 3 mmHg, the estimated right ventricular systolic  pressure is normal at 20.0 mmHg.  12. The inferior vena cava is normal in size with greater than 50%  respiratory variability, suggesting right atrial pressure of 3 mmHg.  Patient Profile     79 y.o. female with a PMH of HTN, HLD (intolerant to statins), moderate hiatal hernia, TIA, spontaneous PTX, osteoporosis, and dementia, who is admitted to cardiology for the evaluation of chest pain and elevated troponins.   Assessment & Plan    1. NSTEMI: patient presented with chest pain radiating to her shoulder and back. EKG showed TWI in lateral leads. HsTrop 4132>4401. No prior ischemic evaluation. Risk factors for CAD include HTN, HLD, and age. She was transferred [facility] to [facility] for further evaluation. She is maintained on a heparin gtt. Received SL nitro x2 on arrival with relief of chest pain. Still with chest tightness on my exam. - Echo pending to evaluate LV function - Would be reasonable to pursue a LHC to evaluate coronary anatomy given uptrending troponins. The patient understands that risks included but are not limited to stroke (1 in 1000), death (1 in 45), kidney failure [usually temporary] (1 in 500), bleeding (1 in 200), allergic reaction [possibly serious] (1 in 200).  The patient understands and agrees to proceed.  - If PCI indicated, will need GI ppx given prior GI upset with aspirin use.  - Will check FLP and A1C for risk stratifications  2. HTN: Well controlled - Continue home amlodipine  3. HLD: LDL 150 08/2019; intolerant to statins, currently on zetia - Will follow-up FLP  - Continue zetia - Low  threshold for referral to lipid clinic if LHC reveals CAD  4. TIA: occurred 08/2019. Reported GI upset with aspirin use.  - Continue plavix and zetia  5. Dementia: mild.  - Continue home citalopram and memantine  For questions or updates, please contact [facility] Please consult [email or web address] for contact info under Cardiology/STEMI.      Signed, Abigail Butts, PA-C  05/10/2020, 9:00 AM   [phone]

## 2020-05-10 NOTE — Progress Notes (Signed)
Family at bedside called out stating the patient's wrist was more swollen. Went to bedside and did notice more bruising. Pressure held; cath lab notified and came to bedside. No hematoma noted.

## 2020-05-10 NOTE — H&P (View-Only) (Signed)
Progress Note  Patient Name: Jeanne Rogers Date of Encounter: 05/10/2020  Primary Cardiologist: New to Dr. Debara Pickett  Subjective   Still with some chest tightness this morning. Was relieved with SL nitro around 5am. No complaints of SOB, palpitations, or LE edema. We discussed her hospital work-up in detail and plans for additional testing. All questions answered.   Inpatient Medications    Scheduled Meds: . amLODipine  2.5 mg Oral QHS  . [START ON 05/11/2020] aspirin EC  81 mg Oral Daily  . ezetimibe  10 mg Oral Daily  . [START ON 05/11/2020] influenza vaccine adjuvanted  0.5 mL Intramuscular Tomorrow-1000   Continuous Infusions: . heparin 650 Units/hr (05/09/20 2319)   PRN Meds: acetaminophen, nitroGLYCERIN, ondansetron (ZOFRAN) IV   Vital Signs    Vitals:   05/10/20 0410 05/10/20 0511 05/10/20 0521 05/10/20 0753  BP: 127/71 116/62 105/60 (!) 115/59  Pulse: 81   75  Resp: 15   17  Temp: 97.6 F (36.4 C)   97.8 F (36.6 C)  TempSrc: Oral   Oral  SpO2: 98%   99%  Weight: 52.6 kg     Height: 5\' 4"  (1.626 m)       Intake/Output Summary (Last 24 hours) at 05/10/2020 0900 Last data filed at 05/10/2020 0844 Gross per 24 hour  Intake 0 ml  Output 100 ml  Net -100 ml   Filed Weights   05/09/20 2018 05/10/20 0410  Weight: 52.2 kg 52.6 kg    Telemetry    Sinus rhythm - Personally Reviewed  ECG    Sinus rhythm with rate 77bpm and new TWI in lateral leads compared to yesterday  - Personally Reviewed  Physical Exam   GEN: No acute distress.   Neck: No JVD, no carotid bruits Cardiac: RRR, no murmurs, rubs, or gallops.  Respiratory: Clear to auscultation bilaterally, no wheezes/ rales/ rhonchi GI: NABS, Soft, nontender, non-distended  MS: No edema; No deformity. Neuro:  Nonfocal, moving all extremities spontaneously Psych: Normal affect   Labs    Chemistry Recent Labs  Lab 05/09/20 2155  NA 137  K 3.8  CL 102  CO2 25  GLUCOSE 115*  BUN 18    CREATININE 0.85  CALCIUM 9.0  PROT 7.0  ALBUMIN 4.1  AST 22  ALT 15  ALKPHOS 47  BILITOT 0.4  GFRNONAA >60  ANIONGAP 10     Hematology Recent Labs  Lab 05/09/20 2155  WBC 9.3  RBC 4.45  HGB 13.3  HCT 40.8  MCV 91.7  MCH 29.9  MCHC 32.6  RDW 13.5  PLT 310    Cardiac EnzymesNo results for input(s): TROPONINI in the last 168 hours. No results for input(s): TROPIPOC in the last 168 hours.   BNPNo results for input(s): BNP, PROBNP in the last 168 hours.   DDimer No results for input(s): DDIMER in the last 168 hours.   Radiology    DG Chest Port 1 View  Result Date: 05/09/2020 CLINICAL DATA:  Chest pain EXAM: PORTABLE CHEST 1 VIEW COMPARISON:  08/25/2019, 01/25/2004 FINDINGS: Chronic apical pleural and parenchymal scarring. Hyperinflation with diffuse bronchitic changes. No acute consolidation or effusion. Slightly enlarged cardiomediastinal silhouette. Moderate hiatal hernia. No pneumothorax. Aortic atherosclerosis. IMPRESSION: No active disease. Hyperinflation with chronic bronchitic changes. Moderate hiatal hernia. Electronically Signed   By: Donavan Foil M.D.   On: 05/09/2020 21:04    Cardiac Studies   Echocardiogram 08/25/2019 1. Left ventricular ejection fraction, by visual estimation, is 55 to  60%. The left ventricle has normal function. There is mildly increased  left ventricular hypertrophy.  2. Left ventricular diastolic parameters are consistent with Grade I  diastolic dysfunction (impaired relaxation).  3. The left ventricle has no regional wall motion abnormalities.  4. Global right ventricle has normal systolic function.The right  ventricular size is normal. No increase in right ventricular wall  thickness.  5. Left atrial size was normal.  6. Right atrial size was normal.  7. Mild mitral annular calcification.  8. The mitral valve is normal in structure. No evidence of mitral valve  regurgitation. No evidence of mitral stenosis.  9. The  tricuspid valve is normal in structure. Tricuspid valve  regurgitation is trivial.  10. The aortic valve was not well visualized. Aortic valve regurgitation  is not visualized. No evidence of aortic valve sclerosis or stenosis.  11. The tricuspid regurgitant velocity is 2.06 m/s, and with an assumed  right atrial pressure of 3 mmHg, the estimated right ventricular systolic  pressure is normal at 20.0 mmHg.  12. The inferior vena cava is normal in size with greater than 50%  respiratory variability, suggesting right atrial pressure of 3 mmHg.  Patient Profile     79 y.o. female with a PMH of HTN, HLD (intolerant to statins), moderate hiatal hernia, TIA, spontaneous PTX, osteoporosis, and dementia, who is admitted to cardiology for the evaluation of chest pain and elevated troponins.   Assessment & Plan    1. NSTEMI: patient presented with chest pain radiating to her shoulder and back. EKG showed TWI in lateral leads. HsTrop 9983>3825. No prior ischemic evaluation. Risk factors for CAD include HTN, HLD, and age. She was transferred from [facility] to [facility] for further evaluation. She is maintained on a heparin gtt. Received SL nitro x2 on arrival with relief of chest pain. Still with chest tightness on my exam. - Echo pending to evaluate LV function - Would be reasonable to pursue a LHC to evaluate coronary anatomy given uptrending troponins. The patient understands that risks included but are not limited to stroke (1 in 1000), death (1 in 30), kidney failure [usually temporary] (1 in 500), bleeding (1 in 200), allergic reaction [possibly serious] (1 in 200).  The patient understands and agrees to proceed.  - If PCI indicated, will need GI ppx given prior GI upset with aspirin use.  - Will check FLP and A1C for risk stratifications  2. HTN: Well controlled - Continue home amlodipine  3. HLD: LDL 150 08/2019; intolerant to statins, currently on zetia - Will follow-up FLP  - Continue zetia - Low  threshold for referral to lipid clinic if LHC reveals CAD  4. TIA: occurred 08/2019. Reported GI upset with aspirin use.  - Continue plavix and zetia  5. Dementia: mild.  - Continue home citalopram and memantine  For questions or updates, please contact [facility] Please consult [email or web address] for contact info under Cardiology/STEMI.      Signed, Abigail Butts, PA-C  05/10/2020, 9:00 AM   [phone]

## 2020-05-10 NOTE — Progress Notes (Signed)
CRITICAL VALUE ALERT  Critical Value: Troponin 4288  Date & Time Notied:  05/10/20 0141 Provider Notified: Dr Paticia Stack  Orders Received/Actions taken: None

## 2020-05-10 NOTE — Progress Notes (Signed)
   05/10/20 1000  Clinical Encounter Type  Visited With Patient;Family  Visit Type Other (Comment) (Advance Directive)  Referral From Nurse  Consult/Referral To Chaplain  The chaplain visited with patient and her husband to review AD paperwork. The chaplain left paperwork and the patient will request a chaplain if she decides to complete the AD. No other chaplain services needed at this time. The chaplain will follow up as needed.

## 2020-05-10 NOTE — Progress Notes (Signed)
Patient arrived to unit via [facility].  Cardiology notified to admit.  Patient with 6/10 chest tightness/pressure.  SL nitro x2 given without relief, patient declines 3rd SL nitro and would just like to rest.  VSS.

## 2020-05-11 DIAGNOSIS — E782 Mixed hyperlipidemia: Secondary | ICD-10-CM

## 2020-05-11 DIAGNOSIS — I214 Non-ST elevation (NSTEMI) myocardial infarction: Secondary | ICD-10-CM | POA: Diagnosis not present

## 2020-05-11 DIAGNOSIS — I5181 Takotsubo syndrome: Secondary | ICD-10-CM | POA: Diagnosis not present

## 2020-05-11 LAB — CBC
HCT: 37.7 % (ref 36.0–46.0)
Hemoglobin: 12.1 g/dL (ref 12.0–15.0)
MCH: 30 pg (ref 26.0–34.0)
MCHC: 32.1 g/dL (ref 30.0–36.0)
MCV: 93.3 fL (ref 80.0–100.0)
Platelets: 270 10*3/uL (ref 150–400)
RBC: 4.04 MIL/uL (ref 3.87–5.11)
RDW: 13.7 % (ref 11.5–15.5)
WBC: 6.1 10*3/uL (ref 4.0–10.5)
nRBC: 0 % (ref 0.0–0.2)

## 2020-05-11 LAB — URINE CULTURE: Culture: <10000 — AB

## 2020-05-11 MED ORDER — METOPROLOL SUCCINATE ER 25 MG PO TB24
12.5000 mg | ORAL_TABLET | Freq: Every day | ORAL | Status: DC
  Administered 2020-05-11: 12.5 mg via ORAL
  Filled 2020-05-11: qty 1

## 2020-05-11 MED ORDER — ASPIRIN 81 MG PO TBEC: 81.0000 mg | DELAYED_RELEASE_TABLET | Freq: Every day | ORAL | 3 refills | Status: DC

## 2020-05-11 MED ORDER — METOPROLOL SUCCINATE ER 25 MG PO TB24: 12.5000 mg | ORAL_TABLET | Freq: Every day | ORAL | 3 refills | Status: DC

## 2020-05-11 MED ORDER — ASPIRIN EC 81 MG PO TBEC
81.0000 mg | DELAYED_RELEASE_TABLET | Freq: Every day | ORAL | Status: DC
  Administered 2020-05-11: 81 mg via ORAL

## 2020-05-11 NOTE — Discharge Summary (Signed)
Discharge Summary    Patient ID: Jeanne Rogers,  MRN: [id number], DOB/AGE: 78-Mar-1942 78 y.o.  Admit date: 05/09/2020 Discharge date: 05/11/2020  Primary Care Provider: Erven Colla Primary Cardiologist: Patient to follow in [place] going forward  Discharge Diagnoses    Principal Problem:   NSTEMI (non-ST elevated myocardial infarction) [facility]) Active Problems:   Hyperlipidemia   SOB (shortness of breath)   Left chest pressure   Takotsubo cardiomyopathy   Allergies Allergies  Allergen Reactions  . Methergine [Methylergonovine Maleate] Other (See Comments)    Unknown    Diagnostic Studies/Procedures    Echocardiogram 05/10/20: 1. Left ventricular ejection fraction, by estimation, is 30 to 35%. The  left ventricle has moderately decreased function. The left ventricle  demonstrates regional wall motion abnormalities with mid to apical  anteroseptal and inferoseptal akinesis.  Apical lateral, apical anterior, and apical inferior akinesis. Akinesis of  the true apex. This pattern suggests LAD territory infarction. No LV  thrombus visualized. There is mild left ventricular hypertrophy. Left  ventricular diastolic parameters are  consistent with Grade I diastolic dysfunction (impaired relaxation).  2. Right ventricular systolic function is normal. The right ventricular  size is normal. There is mildly elevated pulmonary artery systolic  pressure. The estimated right ventricular systolic pressure is 56.3 mmHg.  3. The mitral valve is normal in structure. No evidence of mitral valve  regurgitation. No evidence of mitral stenosis.  4. The aortic valve is tricuspid. Aortic valve regurgitation is not  visualized. No aortic stenosis is present.  5. A small pericardial effusion is present, primarily adjacent to the RV  free wall.  6. The inferior vena cava is normal in size with <50% respiratory  variability, suggesting right atrial pressure of 8 mmHg.   Left  heart catheterization 05/10/20:  Right dominant coronary anatomy  Widely patent left main  Widely patent LAD  Widely patent circumflex  Widely patent RCA  Coronary tortuosity  Apical ballooning/Takotsubo cardiomyopathy with LVEDP 6 mmHg.  EF 30 to 35%.  RECOMMENDATIONS:   Supportive therapy  Wean and DC nitro as tolerated given low LVEDP.  Convert anticoagulation to subcu.  _____________   History of Present Illness     Jeanne Rogers is a 79 y.o. female with a history of memory changes, prior TIA, hypertension, hyperlipidemia (statin intolerance), spontaneous pneumothorax, and osteoporosis who presented to the hospital with complaints of chest pain. The patient reported left-sided chest pain that it radiated to the shoulder and the back.  She denied any associated diaphoresis, nausea, vomiting or shortness of breath.  In the ED at [facility] the vitals were: Blood pressure 137/77 mmHg, heart rate 82 bpm, temperature 98.7 and O2 saturation 100%.  High-sensitivity troponins were 2312 and 4288. The ECG that was done 05/10/2020 (at 03:17) was personally reviewed and demonstrated sinus rhythm, rate 77 bpm, and T wave abnormalities in the lateral leads.  The CXR showed hyperinflation with chronic bronchitic changes. Moderate hiatal hernia  Pertinent labs were: Sodium 137, potassium 3.8, glucose 115, BUN 18, creatinine 0.85, WBC 9.3, hemoglobin 13.3, hematocrit 40.8 and platelets 310.  Patient was transported to [facility] for further evaluation given elevated troponins.   Hospital Course     Consultants: None   1. NSTEMI in the setting of Takatsubo cardiomyopathy: patient presented with chest pain radiating to her shoulder and back. EKG showed TWI in lateral leads. HsTrop 218 500 2714. No prior ischemic evaluation. Risk factors for CAD include HTN, HLD,  and age. She was transferred from [facility] to [facility] for further evaluation. She is maintained on a heparin gtt.  Received SL nitro x2 on arrival with relief of chest pain. Echo showed LVEF 30-35% with LAD territory WMA. She was taken to the cath lab here she was found to have normal coronaries with apical ballooning and a hyperdynamic base, suggestive of Takatsubo cardiomyopathy. Home amlodipine was transitioned to metoprolol succinate. Soft blood pressures limited the addition of ACEi/ARB/ARNI.  - Continue Toprol XL 12.5 mg daily - Encouraged a low sodium diet and daily weights  - Anticipate a repeat echocardiogram in 3 months to evaluate for improvement in LV function  2. HTN: BP on the soft side this admission. Home amlodipine discontinued and she was started on metoprolol succinate in light of #1 - Continue metoprolol succinate 12.5mg  daily - Encourage home BP monitoring with plans to bring the log to her follow-up appointment to determine if any other medication changes can/need to be made.  3. HLD: LDL 139 this admission; intolerant to statins, currently on zetia. Given no evidence of CAD on LHC this admission no changes were made. - Continue zetia  4. TIA: occurred 08/2019. Reported possible GI upset with aspirin use, recommend EC aspirin (lower bleeding risk than Plavix). Encouraged patient to use OTC antacid medications if she experiences GI upset again.  - Continue aspirin  - Continue zetia  5. Dementia: mild.  - Continue home citalopram and memantine  Unfortunately our office visit availability in our [place] office was limited. Patient has been scheduled for follow-up in our [place] office for her post-hospital visit.  _____________  Discharge Vitals Blood pressure (!) 111/54, pulse 82, temperature 98.2 F (36.8 C), temperature source Oral, resp. rate 18, height 5\' 4"  (1.626 m), weight 52.6 kg, SpO2 97 %.  Filed Weights   05/09/20 2018 05/10/20 0410 05/11/20 0400  Weight: 52.2 kg 52.6 kg 52.6 kg    Labs & Radiologic Studies    CBC Recent Labs    05/09/20 2155 05/09/20 2155  05/10/20 1332 05/11/20 0307  WBC 9.3   < > 8.1 6.1  NEUTROABS 7.1  --   --   --   HGB 13.3   < > 12.5 12.1  HCT 40.8   < > 38.5 37.7  MCV 91.7   < > 92.3 93.3  PLT 310   < > 270 270   < > = values in this interval not displayed.   Basic Metabolic Panel Recent Labs    05/09/20 2155 05/10/20 1247  NA 137  --   K 3.8  --   CL 102  --   CO2 25  --   GLUCOSE 115*  --   BUN 18  --   CREATININE 0.85 0.75  CALCIUM 9.0  --    Liver Function Tests Recent Labs    05/09/20 2155  AST 22  ALT 15  ALKPHOS 47  BILITOT 0.4  PROT 7.0  ALBUMIN 4.1   No results for input(s): LIPASE, AMYLASE in the last 72 hours. Cardiac Enzymes No results for input(s): CKTOTAL, CKMB, CKMBINDEX, TROPONINI in the last 72 hours. BNP Invalid input(s): POCBNP D-Dimer No results for input(s): DDIMER in the last 72 hours. Hemoglobin A1C Recent Labs    05/10/20 1034  HGBA1C 5.4   Fasting Lipid Panel Recent Labs    05/10/20 1034  CHOL 231*  HDL 71  LDLCALC 139*  TRIG 106  CHOLHDL 3.3   Thyroid Function Tests No results  for input(s): TSH, T4TOTAL, T3FREE, THYROIDAB in the last 72 hours.  Invalid input(s): FREET3 _____________  CARDIAC CATHETERIZATION  Result Date: 05/10/2020  Right dominant coronary anatomy  Widely patent left main  Widely patent LAD  Widely patent circumflex  Widely patent RCA  Coronary tortuosity  Apical ballooning/Takotsubo cardiomyopathy with LVEDP 6 mmHg.  EF 30 to 35%. RECOMMENDATIONS:  Supportive therapy  Wean and DC nitro as tolerated given low LVEDP.  Convert anticoagulation to subcu.  DG Chest Port 1 View  Result Date: 05/09/2020 CLINICAL DATA:  Chest pain EXAM: PORTABLE CHEST 1 VIEW COMPARISON:  08/25/2019, 01/25/2004 FINDINGS: Chronic apical pleural and parenchymal scarring. Hyperinflation with diffuse bronchitic changes. No acute consolidation or effusion. Slightly enlarged cardiomediastinal silhouette. Moderate hiatal hernia. No pneumothorax. Aortic  atherosclerosis. IMPRESSION: No active disease. Hyperinflation with chronic bronchitic changes. Moderate hiatal hernia. Electronically Signed   By: Donavan Foil M.D.   On: 05/09/2020 21:04   ECHOCARDIOGRAM COMPLETE  Result Date: 05/10/2020    ECHOCARDIOGRAM REPORT   Patient Name:   Jeanne Rogers Date of Exam: 05/10/2020 Medical Rec #:  [id number]         Height:       64.0 in Accession #:    [id number]        Weight:       115.9 lb Date of Birth:  Jan 24, 1941         BSA:          1.551 m Patient Age:    32 years          BP:           115/59 mmHg Patient Gender: F                 HR:           75 bpm. Exam Location:  Inpatient Procedure: 2D Echo, Cardiac Doppler and Color Doppler Indications:    Chest pain  History:        Patient has prior history of Echocardiogram examinations, most                 recent 08/25/2019. TIA; Risk Factors:Hypertension and                 Dyslipidemia.  Sonographer:    Dustin Flock Referring Phys: [id number] Allison  1. Left ventricular ejection fraction, by estimation, is 30 to 35%. The left ventricle has moderately decreased function. The left ventricle demonstrates regional wall motion abnormalities with mid to apical anteroseptal and inferoseptal akinesis. Apical lateral, apical anterior, and apical inferior akinesis. Akinesis of the true apex. This pattern suggests LAD territory infarction. No LV thrombus visualized. There is mild left ventricular hypertrophy. Left ventricular diastolic parameters are consistent with Grade I diastolic dysfunction (impaired relaxation).  2. Right ventricular systolic function is normal. The right ventricular size is normal. There is mildly elevated pulmonary artery systolic pressure. The estimated right ventricular systolic pressure is 96.7 mmHg.  3. The mitral valve is normal in structure. No evidence of mitral valve regurgitation. No evidence of mitral stenosis.  4. The aortic valve is tricuspid. Aortic valve  regurgitation is not visualized. No aortic stenosis is present.  5. A small pericardial effusion is present, primarily adjacent to the RV free wall.  6. The inferior vena cava is normal in size with <50% respiratory variability, suggesting right atrial pressure of 8 mmHg. FINDINGS  Left Ventricle: Left ventricular ejection fraction, by estimation, is 30 to  35%. The left ventricle has moderately decreased function. The left ventricle demonstrates regional wall motion abnormalities. The left ventricular internal cavity size was normal in size. There is mild left ventricular hypertrophy. Left ventricular diastolic parameters are consistent with Grade I diastolic dysfunction (impaired relaxation). Right Ventricle: The right ventricular size is normal. No increase in right ventricular wall thickness. Right ventricular systolic function is normal. There is mildly elevated pulmonary artery systolic pressure. The tricuspid regurgitant velocity is 2.69  m/s, and with an assumed right atrial pressure of 8 mmHg, the estimated right ventricular systolic pressure is 95.1 mmHg. Left Atrium: Left atrial size was normal in size. Right Atrium: Right atrial size was normal in size. Pericardium: A small pericardial effusion is present. Mitral Valve: The mitral valve is normal in structure. There is mild calcification of the mitral valve leaflet(s). No evidence of mitral valve regurgitation. No evidence of mitral valve stenosis. Tricuspid Valve: The tricuspid valve is normal in structure. Tricuspid valve regurgitation is trivial. Aortic Valve: The aortic valve is tricuspid. Aortic valve regurgitation is not visualized. No aortic stenosis is present. Pulmonic Valve: The pulmonic valve was normal in structure. Pulmonic valve regurgitation is not visualized. Aorta: The aortic root is normal in size and structure. Venous: The inferior vena cava is normal in size with less than 50% respiratory variability, suggesting right atrial pressure  of 8 mmHg. IAS/Shunts: No atrial level shunt detected by color flow Doppler.  LEFT VENTRICLE PLAX 2D LVIDd:         3.71 cm      Diastology LVIDs:         3.18 cm      LV e' medial:    5.11 cm/s LV PW:         1.06 cm      LV E/e' medial:  8.7 LV IVS:        0.95 cm      LV e' lateral:   4.35 cm/s LVOT diam:     1.90 cm      LV E/e' lateral: 10.3 LV SV:         37 LV SV Index:   24 LVOT Area:     2.84 cm  LV Volumes (MOD) LV vol d, MOD A4C: 119.0 ml LV vol s, MOD A4C: 74.4 ml LV SV MOD A4C:     119.0 ml RIGHT VENTRICLE RV Basal diam:  2.55 cm RV S prime:     12.40 cm/s TAPSE (M-mode): 2.7 cm LEFT ATRIUM             Index       RIGHT ATRIUM          Index LA diam:        3.20 cm 2.06 cm/m  RA Area:     6.47 cm LA Vol (A2C):   23.6 ml 15.21 ml/m RA Volume:   11.40 ml 7.35 ml/m LA Vol (A4C):   19.2 ml 12.38 ml/m LA Biplane Vol: 21.4 ml 13.79 ml/m  AORTIC VALVE LVOT Vmax:   60.60 cm/s LVOT Vmean:  41.200 cm/s LVOT VTI:    0.129 m  AORTA Ao Root diam: 2.20 cm MITRAL VALVE               TRICUSPID VALVE MV Area (PHT): 4.39 cm    TR Peak grad:   28.9 mmHg MV Decel Time: 173 msec    TR Vmax:        269.00 cm/s MV E velocity: 44.60  cm/s MV A velocity: 95.60 cm/s  SHUNTS MV E/A ratio:  0.47        Systemic VTI:  0.13 m                            Systemic Diam: 1.90 cm Loralie Champagne MD Electronically signed by Loralie Champagne MD Signature Date/Time: 05/10/2020/3:09:00 PM    Final    Disposition   Patient was seen and examined by Dr. Debara Pickett who deemed patient as stable for discharge. Follow-up has been arranged. Discharge medications as listed below.   Follow-up Plans & Appointments     Discharge Instructions    Amb Referral to Cardiac Rehabilitation   Complete by: As directed    Diagnosis: NSTEMI   After initial evaluation and assessments completed: Virtual Based Care may be provided alone or in conjunction with Phase 2 Cardiac Rehab based on patient barriers.: Yes      Discharge Medications   Allergies  as of 05/11/2020      Reactions   Methergine [methylergonovine Maleate] Other (See Comments)   Unknown      Medication List    STOP taking these medications   amLODipine 2.5 MG tablet Commonly known as: NORVASC   clopidogrel 75 MG tablet Commonly known as: PLAVIX   traMADol 50 MG tablet Commonly known as: ULTRAM     TAKE these medications   alendronate 70 MG tablet Commonly known as: FOSAMAX TAKE 1 TABLET BY MOUTH EVERY WEEK   aspirin 81 MG EC tablet Take 1 tablet (81 mg total) by mouth daily. Swallow whole. Start taking on: May 12, 2020   citalopram 10 MG tablet Commonly known as: CeleXA Take 1 tablet (10 mg total) by mouth daily.   ezetimibe 10 MG tablet Commonly known as: Zetia Take 1 tablet (10 mg total) by mouth daily.   memantine 10 MG tablet Commonly known as: Namenda Take 1 tablet (10 mg total) by mouth 2 (two) times daily.   metoprolol succinate 25 MG 24 hr tablet Commonly known as: TOPROL-XL Take 0.5 tablets (12.5 mg total) by mouth daily. Start taking on: May 12, 2020   zolpidem 5 MG tablet Commonly known as: AMBIEN TAKE 1 TABLET(10 MG) BY MOUTH AT BEDTIME AS NEEDED FOR SLEEP What changed:   how much to take  how to take this  when to take this  reasons to take this  additional instructions         Outstanding Labs/Studies   None  Duration of Discharge Encounter   Greater than 30 minutes including physician time.  Signed, Abigail Butts PA-C 05/11/2020, 10:41 AM

## 2020-05-11 NOTE — Plan of Care (Signed)

## 2020-05-11 NOTE — Discharge Instructions (Signed)
PLEASE REMEMBER TO BRING ALL OF YOUR MEDICATIONS TO EACH OF YOUR FOLLOW-UP OFFICE VISITS.  PLEASE ATTEND ALL SCHEDULED FOLLOW-UP APPOINTMENTS.   Activity: Increase activity slowly as tolerated. You may shower, but no soaking baths (or swimming) for 1 week. No driving for 24 hours. No lifting over 5 lbs for 1 week. No sexual activity for 1 week.   Wound Care: You may wash cath site gently with soap and water. Keep cath site clean and dry. If you notice pain, swelling, bleeding or pus at your cath site, please call [phone].   Heart Failure Education: 1. Weigh yourself EVERY morning after you go to the bathroom but before you eat or drink anything. Write this number down in a weight log/diary. If you gain 3 pounds overnight or 5 pounds in a week, call the office. 2. Take your medicines as prescribed. If you have concerns about your medications, please call us before you stop taking them.  3. Eat low salt foods--Limit salt (sodium) to 2000 mg per day. This will help prevent your body from holding onto fluid. Read food labels as many processed foods have a lot of sodium, especially canned goods and prepackaged meats. If you would like some assistance choosing low sodium foods, we would be happy to set you up with a nutritionist. 4. Stay as active as you can everyday. Staying active will give you more energy and make your muscles stronger. Start with 5 minutes at a time and work your way up to 30 minutes a day. Break up your activities--do some in the morning and some in the afternoon. Start with 3 days per week and work your way up to 5 days as you can.  If you have chest pain, feel short of breath, dizzy, or lightheaded, STOP. If you don't feel better after a short rest, call 911. If you do feel better, call the office to let us know you have symptoms with exercise. 5. Limit all fluids for the day to less than 2 liters. Fluid includes all drinks, coffee, juice, ice chips, soup, jello, and all other  liquids.  Blood pressure monitoring:  - Please keep a log of your blood pressures at home - check it morning and night - and bring the log with you to your follow-up visit with Roby Lofts, PA-C 05/17/20. This will help [place] determine if any medication changes need to occur.  Medication changes: - STOP amlodipine (norvasc) - STOP clopidogrel (plavix) - START aspirin 81 mg daily - if you experience stomach upset again with this medication, please pick up an over-the-counter antacid (pepcid, nexium, omeprazole, etc).  - START metoprolol succinate (Toprol- XL) 12.5mg  daily which is helping to support your heart function in the setting of a weakened heart muscle.

## 2020-05-11 NOTE — Progress Notes (Signed)
D/C instructions given and reviewed. No questions asked but encouraged to call with any concerns. Tele and IV removed, tolerated well. 

## 2020-05-11 NOTE — Progress Notes (Signed)
Patient's daughter requesting to speak with MD. Daughter - Cedrica Brune [phone]. MD made aware. Visitation policy explained to daughter - one visitor per day.

## 2020-05-11 NOTE — Progress Notes (Signed)
CARDIAC REHAB PHASE I   PRE:  Rate/Rhythm: 86 SR    BP: sitting 125/64    SaO2: 98 RA  MODE:  Ambulation: 470 ft   POST:  Rate/Rhythm: 114 ST    BP: sitting 124/70     SaO2: 96 RA  Pt in bathroom on arrival. Able to walk without major c/o. Denies SOB. HR up but pt taking quicker pace. VSS. Discussed MI/takotsubo, restrictions, low sodium diet, daily wts, exercise, and CRPII. Pt and family receptive, will refer to [place].  [id number]   [place], ACSM 05/11/2020 10:10 AM

## 2020-05-11 NOTE — Progress Notes (Signed)
Progress Note  Patient Name: Jeanne Rogers Date of Encounter: 05/11/2020  Primary Cardiologist: New to Dr. Debara Pickett  Subjective   No chest pain today. 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 Echo also seems to support this with LVEF 30-35%. Troponin continues to trend downward (4288, 1914, 1571). LDL 139, HDL 71. Urine culture from [facility] shows no significant growth, <10,000 colonies.  Inpatient Medications    Scheduled Meds: . amLODipine  2.5 mg Oral QHS  . aspirin EC  81 mg Oral Daily  . citalopram  10 mg Oral Daily  . ezetimibe  10 mg Oral Daily  . heparin  5,000 Units Subcutaneous Q8H  . influenza vaccine adjuvanted  0.5 mL Intramuscular Tomorrow-1000  . memantine  10 mg Oral BID  . sodium chloride flush  3 mL Intravenous Q12H   Continuous Infusions: . sodium chloride    . nitroGLYCERIN 5 mcg/min (05/10/20 1037)   PRN Meds: sodium chloride, acetaminophen, nitroGLYCERIN, ondansetron (ZOFRAN) IV, oxyCODONE, sodium chloride flush   Vital Signs    Vitals:   05/10/20 2056 05/11/20 0400 05/11/20 0457 05/11/20 0724  BP: (!) 97/46  (!) 91/48 113/63  Pulse: 78  69 72  Resp: 18  14 18   Temp: (!) 97.5 F (36.4 C)  98.3 F (36.8 C) 98.2 F (36.8 C)  TempSrc: Oral  Oral Oral  SpO2: 98%  96% 95%  Weight:  52.6 kg    Height:        Intake/Output Summary (Last 24 hours) at 05/11/2020 0859 Last data filed at 05/10/2020 2100 Gross per 24 hour  Intake 660 ml  Output 625 ml  Net 35 ml   Filed Weights   05/09/20 2018 05/10/20 0410 05/11/20 0400  Weight: 52.2 kg 52.6 kg 52.6 kg    Telemetry    Sinus rhythm - Personally Reviewed  ECG    Sinus rhythm at 72 with anterolateral deep TWI's  - Personally Reviewed  Physical Exam   GEN: No acute distress.   Neck: No JVD, no carotid bruits Cardiac: RRR, no murmurs, rubs, or gallops.  Respiratory: Clear to auscultation bilaterally, no wheezes/ rales/  rhonchi GI: NABS, Soft, nontender, non-distended  MS: No edema; No deformity. Neuro:  Nonfocal, moving all extremities spontaneously Psych: Normal affect   Labs    Chemistry Recent Labs  Lab 05/09/20 2155 05/10/20 1247  NA 137  --   K 3.8  --   CL 102  --   CO2 25  --   GLUCOSE 115*  --   BUN 18  --   CREATININE 0.85 0.75  CALCIUM 9.0  --   PROT 7.0  --   ALBUMIN 4.1  --   AST 22  --   ALT 15  --   ALKPHOS 47  --   BILITOT 0.4  --   GFRNONAA >60 >60  ANIONGAP 10  --      Hematology Recent Labs  Lab 05/09/20 2155 05/10/20 1332 05/11/20 0307  WBC 9.3 8.1 6.1  RBC 4.45 4.17 4.04  HGB 13.3 12.5 12.1  HCT 40.8 38.5 37.7  MCV 91.7 92.3 93.3  MCH 29.9 30.0 30.0  MCHC 32.6 32.5 32.1  RDW 13.5 13.8 13.7  PLT 310 270 270    Cardiac EnzymesNo results for input(s): TROPONINI in the last 168 hours. No results for input(s): TROPIPOC in the last 168 hours.   BNPNo results for input(s): BNP, PROBNP in the last 168  hours.   DDimer No results for input(s): DDIMER in the last 168 hours.   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  EF 30 to 35%. RECOMMENDATIONS:  Supportive therapy  Wean and DC nitro as tolerated given low LVEDP.  Convert anticoagulation to subcu.  DG Chest Port 1 View  Result Date: 05/09/2020 CLINICAL DATA:  Chest pain EXAM: PORTABLE CHEST 1 VIEW COMPARISON:  08/25/2019, 01/25/2004 FINDINGS: Chronic apical pleural and parenchymal scarring. Hyperinflation with diffuse bronchitic changes. No acute consolidation or effusion. Slightly enlarged cardiomediastinal silhouette. Moderate hiatal hernia. No pneumothorax. Aortic atherosclerosis. IMPRESSION: No active disease. Hyperinflation with chronic bronchitic changes. Moderate hiatal  hernia. Electronically Signed   By: Donavan Foil M.D.   On: 05/09/2020 21:04   ECHOCARDIOGRAM COMPLETE  Result Date: 05/10/2020    ECHOCARDIOGRAM REPORT   Patient Name:   Jeanne Rogers Kilmer Date of Exam: 05/10/2020 Medical Rec #:  [id number]         Height:       64.0 in Accession #:    [id number]        Weight:       115.9 lb Date of Birth:  1941-06-25         BSA:          1.551 m Patient Age:    79 years          BP:           115/59 mmHg Patient Gender: F                 HR:           75 bpm. Exam Location:  Inpatient Procedure: 2D Echo, Cardiac Doppler and Color Doppler Indications:    Chest pain  History:        Patient has prior history of Echocardiogram examinations, most                 recent 08/25/2019. TIA; Risk Factors:Hypertension and                 Dyslipidemia.  Sonographer:    Dustin Flock Referring Phys: [id number] [place]  1. Left ventricular ejection fraction, by estimation, is 30 to 35%. The left ventricle has moderately decreased function. The left ventricle demonstrates regional wall motion abnormalities with mid to apical anteroseptal and inferoseptal akinesis. Apical lateral, apical anterior, and apical inferior akinesis. Akinesis of the true apex. This pattern suggests LAD territory infarction. No LV thrombus visualized. There is mild left ventricular hypertrophy. Left ventricular diastolic parameters are consistent with Grade I diastolic dysfunction (impaired relaxation).  2. Right ventricular systolic function is normal. The right ventricular size is normal. There is mildly elevated pulmonary artery systolic pressure. The estimated right ventricular systolic pressure is 49.8 mmHg.  3. The mitral valve is normal in structure. No evidence of mitral valve regurgitation. No evidence of mitral stenosis.  4. The aortic valve is tricuspid. Aortic valve regurgitation is not visualized. No aortic stenosis is present.  5. A small pericardial effusion is present, primarily  adjacent to the RV free wall.  6. The inferior vena cava is normal in size with <50% respiratory variability, suggesting right atrial pressure of 8 mmHg. FINDINGS  Left Ventricle: Left ventricular ejection fraction, by estimation,  is 30 to 35%. The left ventricle has moderately decreased function. The left ventricle demonstrates regional wall motion abnormalities. The left ventricular internal cavity size was normal in size. There is mild left ventricular hypertrophy. Left ventricular diastolic parameters are consistent with Grade I diastolic dysfunction (impaired relaxation). Right Ventricle: The right ventricular size is normal. No increase in right ventricular wall thickness. Right ventricular systolic function is normal. There is mildly elevated pulmonary artery systolic pressure. The tricuspid regurgitant velocity is 2.69  m/s, and with an assumed right atrial pressure of 8 mmHg, the estimated right ventricular systolic pressure is 79.8 mmHg. Left Atrium: Left atrial size was normal in size. Right Atrium: Right atrial size was normal in size. Pericardium: A small pericardial effusion is present. Mitral Valve: The mitral valve is normal in structure. There is mild calcification of the mitral valve leaflet(s). No evidence of mitral valve regurgitation. No evidence of mitral valve stenosis. Tricuspid Valve: The tricuspid valve is normal in structure. Tricuspid valve regurgitation is trivial. Aortic Valve: The aortic valve is tricuspid. Aortic valve regurgitation is not visualized. No aortic stenosis is present. Pulmonic Valve: The pulmonic valve was normal in structure. Pulmonic valve regurgitation is not visualized. Aorta: The aortic root is normal in size and structure. Venous: The inferior vena cava is normal in size with less than 50% respiratory variability, suggesting right atrial pressure of 8 mmHg. IAS/Shunts: No atrial level shunt detected by color flow Doppler.  LEFT VENTRICLE PLAX 2D LVIDd:         3.71  cm      Diastology LVIDs:         3.18 cm      LV e' medial:    5.11 cm/s LV PW:         1.06 cm      LV E/e' medial:  8.7 LV IVS:        0.95 cm      LV e' lateral:   4.35 cm/s LVOT diam:     1.90 cm      LV E/e' lateral: 10.3 LV SV:         37 LV SV Index:   24 LVOT Area:     2.84 cm  LV Volumes (MOD) LV vol d, MOD A4C: 119.0 ml LV vol s, MOD A4C: 74.4 ml LV SV MOD A4C:     119.0 ml RIGHT VENTRICLE RV Basal diam:  2.55 cm RV S prime:     12.40 cm/s TAPSE (M-mode): 2.7 cm LEFT ATRIUM             Index       RIGHT ATRIUM          Index LA diam:        3.20 cm 2.06 cm/m  RA Area:     6.47 cm LA Vol (A2C):   23.6 ml 15.21 ml/m RA Volume:   11.40 ml 7.35 ml/m LA Vol (A4C):   19.2 ml 12.38 ml/m LA Biplane Vol: 21.4 ml 13.79 ml/m  AORTIC VALVE LVOT Vmax:   60.60 cm/s LVOT Vmean:  41.200 cm/s LVOT VTI:    0.129 m  AORTA Ao Root diam: 2.20 cm MITRAL VALVE               TRICUSPID VALVE MV Area (PHT): 4.39 cm    TR Peak grad:   28.9 mmHg MV Decel Time: 173 msec    TR Vmax:        269.00 cm/s MV  E velocity: 44.60 cm/s MV A velocity: 95.60 cm/s  SHUNTS MV E/A ratio:  0.47        Systemic VTI:  0.13 m                            Systemic Diam: 1.90 cm Jeanne Champagne MD Electronically signed by Jeanne Champagne MD Signature Date/Time: 05/10/2020/3:09:00 PM    Final     Cardiac Studies   Echocardiogram 08/25/2019 1. Left ventricular ejection fraction, by visual estimation, is 55 to  60%. The left ventricle has normal function. There is mildly increased  left ventricular hypertrophy.  2. Left ventricular diastolic parameters are consistent with Grade I  diastolic dysfunction (impaired relaxation).  3. The left ventricle has no regional wall motion abnormalities.  4. Global right ventricle has normal systolic function.The right  ventricular size is normal. No increase in right ventricular wall  thickness.  5. Left atrial size was normal.  6. Right atrial size was normal.  7. Mild mitral annular  calcification.  8. The mitral valve is normal in structure. No evidence of mitral valve  regurgitation. No evidence of mitral stenosis.  9. The tricuspid valve is normal in structure. Tricuspid valve  regurgitation is trivial.  10. The aortic valve was not well visualized. Aortic valve regurgitation  is not visualized. No evidence of aortic valve sclerosis or stenosis.  11. The tricuspid regurgitant velocity is 2.06 m/s, and with an assumed  right atrial pressure of 3 mmHg, the estimated right ventricular systolic  pressure is normal at 20.0 mmHg.  12. The inferior vena cava is normal in size with greater than 50%  respiratory variability, suggesting right atrial pressure of 3 mmHg.  Patient Profile     79 y.o. female with a PMH of HTN, HLD (intolerant to statins), moderate hiatal hernia, TIA, spontaneous PTX, osteoporosis, and dementia, who is admitted to cardiology for the evaluation of chest pain and elevated troponins.   Assessment & Plan    1. NSTEMI: patient presented with chest pain radiating to her shoulder and back. EKG showed TWI in lateral leads. HsTrop [id number]. No prior ischemic evaluation. Risk factors for CAD include HTN, HLD, and age. She was transferred from [facility] to [facility] for further evaluation. She is maintained on a heparin gtt. Received SL nitro x2 on arrival with relief of chest pain. Still with chest tightness on my exam. - Echo yesterday shows LVEF 30-35% with LAD territory WMA - Cath demonstrated normal coronaries with apical ballooning and a hyperdynamic base, suggestive of Takatsubo cardiomyopathy - Start Toprol XL 12.5 mg daily, d/c amlodipine  2. HTN: Well controlled - D/c home amlodipine due to borderline low BP and cardiomyopathy - will be adding metoprolol  3. HLD: LDL 150 08/2019; intolerant to statins, currently on zetia - Will follow-up [facility]  - Continue zetia  4. TIA: occurred 08/2019. Reported GI upset with aspirin use, recommend EC aspirin (lower  bleeding risk than Plavix)  - Start EC aspirin, continue zetia  5. Dementia: mild.  - Continue home citalopram and memantine  Ok for d/c home today - discussed with husband and bedside and daughter Jeanne Lindo, MD, an ER physician at [facility]. Can follow-up with Dr. Domenic Polite or Harl Bowie in [place] for convenience.  For questions or updates, please contact [place] Please consult [email or web address] for contact info under Cardiology/STEMI.     Jeanne Casino, MD, [facility], [place]  [facility] Diplomate of the American Board of Clinical Lipidology Attending Cardiologist  Direct Dial: [phone]  Fax: [phone]  Website:  [email or web address]  Jeanne Casino, MD  05/11/2020, 8:59 AM

## 2020-05-14 ENCOUNTER — Emergency Department (HOSPITAL_COMMUNITY)
Admission: EM | Admit: 2020-05-14 | Discharge: 2020-05-14 | Disposition: A | Discharge status: Home / Self Care | Payer: Medicare Other | Attending: Emergency Medicine | Admitting: Emergency Medicine

## 2020-05-14 ENCOUNTER — Telehealth: Payer: Self-pay | Admitting: *Deleted

## 2020-05-14 ENCOUNTER — Encounter (HOSPITAL_COMMUNITY): Payer: Self-pay | Admitting: *Deleted

## 2020-05-14 ENCOUNTER — Other Ambulatory Visit: Payer: Self-pay

## 2020-05-14 ENCOUNTER — Emergency Department (HOSPITAL_COMMUNITY): Payer: Medicare Other

## 2020-05-14 DIAGNOSIS — Z20822 Contact with and (suspected) exposure to covid-19: Secondary | ICD-10-CM | POA: Insufficient documentation

## 2020-05-14 DIAGNOSIS — R21 Rash and other nonspecific skin eruption: Secondary | ICD-10-CM | POA: Diagnosis not present

## 2020-05-14 DIAGNOSIS — Z87891 Personal history of nicotine dependence: Secondary | ICD-10-CM | POA: Insufficient documentation

## 2020-05-14 DIAGNOSIS — Z7982 Long term (current) use of aspirin: Secondary | ICD-10-CM | POA: Insufficient documentation

## 2020-05-14 DIAGNOSIS — R0789 Other chest pain: Secondary | ICD-10-CM | POA: Diagnosis not present

## 2020-05-14 DIAGNOSIS — I214 Non-ST elevation (NSTEMI) myocardial infarction: Secondary | ICD-10-CM | POA: Diagnosis not present

## 2020-05-14 DIAGNOSIS — K449 Diaphragmatic hernia without obstruction or gangrene: Secondary | ICD-10-CM | POA: Diagnosis not present

## 2020-05-14 DIAGNOSIS — R079 Chest pain, unspecified: Secondary | ICD-10-CM | POA: Diagnosis not present

## 2020-05-14 LAB — BASIC METABOLIC PANEL
Anion gap: 10 (ref 5–15)
BUN: 16 mg/dL (ref 8–23)
CO2: 28 mmol/L (ref 22–32)
Calcium: 10.2 mg/dL (ref 8.9–10.3)
Chloride: 100 mmol/L (ref 98–111)
Creatinine, Ser: 0.91 mg/dL (ref 0.44–1.00)
GFR, Estimated: >60 mL/min (ref >60)
Glucose, Bld: 96 mg/dL (ref 70–99)
Potassium: 4.1 mmol/L (ref 3.5–5.1)
Sodium: 138 mmol/L (ref 135–145)

## 2020-05-14 LAB — RESPIRATORY PANEL BY RT PCR (FLU A&B, COVID)
Influenza A by PCR: NEGATIVE
Influenza B by PCR: NEGATIVE
SARS Coronavirus 2 by RT PCR: NEGATIVE

## 2020-05-14 LAB — CBC
HCT: 14.9 % — ABNORMAL LOW (ref 36.0–46.0)
Hemoglobin: 13 g/dL (ref 12.0–15.0)
MCH: 90.3 pg — ABNORMAL HIGH (ref 26.0–34.0)
MCHC: 87.2 g/dL — ABNORMAL HIGH (ref 30.0–36.0)
MCV: 103.5 fL — ABNORMAL HIGH (ref 80.0–100.0)
Platelets: 201 10*3/uL (ref 150–400)
RBC: 1.44 MIL/uL — ABNORMAL LOW (ref 3.87–5.11)
WBC: 6.4 10*3/uL (ref 4.0–10.5)
nRBC: 0 % (ref 0.0–0.2)

## 2020-05-14 LAB — TROPONIN I (HIGH SENSITIVITY)
Troponin I (High Sensitivity): 65 ng/L — ABNORMAL HIGH (ref <18)
Troponin I (High Sensitivity): 68 ng/L — ABNORMAL HIGH (ref <18)

## 2020-05-14 LAB — BRAIN NATRIURETIC PEPTIDE: B Natriuretic Peptide: 436 pg/mL — ABNORMAL HIGH (ref 0.0–100.0)

## 2020-05-14 MED ORDER — ISOSORBIDE MONONITRATE ER 30 MG PO TB24: 30.0000 mg | ORAL_TABLET | Freq: Every day | ORAL | 0 refills | Status: DC

## 2020-05-14 MED ORDER — MORPHINE SULFATE (PF) 2 MG/ML IV SOLN
2.0000 mg | Freq: Once | INTRAVENOUS | Status: AC
  Administered 2020-05-14: 2 mg via INTRAVENOUS
  Filled 2020-05-14: qty 1

## 2020-05-14 MED ORDER — ISOSORBIDE MONONITRATE ER 30 MG PO TB24
30.0000 mg | ORAL_TABLET | Freq: Every day | ORAL | Status: DC
  Administered 2020-05-14: 30 mg via ORAL
  Filled 2020-05-14 (×3): qty 1

## 2020-05-14 MED ORDER — ASPIRIN 81 MG PO CHEW
243.0000 mg | CHEWABLE_TABLET | Freq: Once | ORAL | Status: AC
  Administered 2020-05-14: 243 mg via ORAL
  Filled 2020-05-14: qty 3

## 2020-05-14 NOTE — ED Provider Notes (Addendum)
[facility] EMERGENCY DEPARTMENT Provider Note   CSN: [id number] Arrival date & time: 05/14/20  1533     History Chief Complaint  Patient presents with  . Chest Pain    Jeanne Rogers is a 79 y.o. female history of hyperlipidemia, PUD, pneumothorax, Takotsubo's cardiomyopathy, NSTEMI.  Patient presents today for chest pain that began this morning around 11 AM.  Patient describes a central chest tightness nonradiating no aggravating or alleviating factors, mild intensity.  Patient reports she was admitted to the hospital for NSTEMI last week, she has been compliant with her home medications since that time.  She reports she was sitting in a chair when pain began.  Denies fever/chills, fall/injury, shortness of breath, headache, cough/hemoptysis, abdominal pain, extremity swelling/color change or numbness/tingling, weakness or any additional concerns.  HPI     Past Medical History:  Diagnosis Date  . Anemia   . Collapse, lung   . Complication of anesthesia    very sensitive to anesthesia  . Hyperlipidemia   . Osteoporosis   . PUD (peptic ulcer disease)   . Spontaneous pneumothorax     Patient Active Problem List   Diagnosis Date Noted  . SOB (shortness of breath)   . Left chest pressure   . Takotsubo cardiomyopathy   . NSTEMI (non-ST elevated myocardial infarction) ([place]) 05/09/2020  . Mild cognitive impairment 10/11/2019  . UTI (urinary tract infection) 08/26/2019  . Hyperlipidemia 08/26/2019  . Generalized weakness 08/26/2019  . 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 Anemia, iron deficiency 01/15/2014  . Postural imbalance 01/12/2014  . Low back pain 10/13/2013  . Compression fracture 07/26/2013  . Vitamin D deficiency 07/26/2013  . Osteoporosis 06/29/2013  . Back pain 06/29/2013    Past Surgical History:  Procedure Laterality Date  . COLONOSCOPY N/A 01/04/2014   Procedure: COLONOSCOPY;  Surgeon: Rogene Houston, MD;  [facility];  Service: Endoscopy;   Laterality: N/A;  200  . ESOPHAGOGASTRODUODENOSCOPY N/A 01/04/2014   Procedure: ESOPHAGOGASTRODUODENOSCOPY (EGD);  Surgeon: Rogene Houston, MD;  [facility];  Service: Endoscopy;  Laterality: N/A;  . hysterectomy    . LEFT HEART CATH AND CORONARY ANGIOGRAPHY N/A 05/10/2020   Procedure: LEFT HEART CATH AND CORONARY ANGIOGRAPHY;  Surgeon: Belva Crome, MD;  Location: [place] [facility];  Service: Cardiovascular;  Laterality: N/A;  . MASS EXCISION Right 02/07/2016   Procedure: EXCISION MASS RIGHT INDEX FINGER AND RIGHT HAND MASS;  Surgeon: Leanora Cover, MD;  Location: [place];  Service: Orthopedics;  Laterality: Right;  . OOPHORECTOMY Bilateral 2005     OB History   No obstetric history on file.     Family History  Problem Relation Age of Onset  . Hyperlipidemia Mother   . Hypertension Father     Social History   Tobacco Use  . Smoking status: Former Smoker    Types: Cigarettes  . Smokeless tobacco: Never Used  Substance Use Topics  . Alcohol use: No  . Drug use: No    Home Medications Prior to Admission medications   Medication Sig Start Date End Date Taking? Authorizing Provider  alendronate (FOSAMAX) 70 MG tablet TAKE 1 TABLET BY MOUTH EVERY WEEK 12/26/19  Yes Mikey Kirschner, MD  aspirin EC 81 MG EC tablet Take 1 tablet (81 mg total) by mouth daily. Swallow whole. 05/12/20  Yes Kroeger, Daleen Snook M., PA-C  citalopram (CELEXA) 10 MG tablet Take 1 tablet (10 mg total) by mouth daily. 01/10/20  Yes Olegario Messier,  Charlynne Cousins, NP  ezetimibe (ZETIA) 10 MG tablet Take 1 tablet (10 mg total) by mouth daily. 04/16/20  Yes Lovena Le, Malena M, DO  memantine (NAMENDA) 10 MG tablet Take 1 tablet (10 mg total) by mouth 2 (two) times daily. 10/11/19  Yes Marcial Pacas, MD  metoprolol succinate (TOPROL-XL) 25 MG 24 hr tablet Take 0.5 tablets (12.5 mg total) by mouth daily. 05/12/20  Yes Kroeger, Daleen Snook M., PA-C  zolpidem (AMBIEN) 5 MG tablet TAKE 1 TABLET(10 MG) BY MOUTH AT BEDTIME AS  NEEDED FOR SLEEP Patient taking differently: Take 5 mg by mouth at bedtime as needed for sleep.  04/16/20  Yes Lovena Le, Malena M, DO  isosorbide mononitrate (IMDUR) 30 MG 24 hr tablet Take 1 tablet (30 mg total) by mouth daily. 05/14/20   Deliah Boston, PA-C    Allergies    Methergine [methylergonovine maleate]  Review of Systems   Review of Systems Ten systems are reviewed and are negative for acute change except as noted in the HPI  Physical Exam Updated Vital Signs BP 111/61   Pulse 67   Temp 98.5 F (36.9 C) (Oral)   Resp 16   Ht 5\' 4"  (1.626 m)   Wt 54 kg   SpO2 96%   BMI 20.43 kg/m   Physical Exam Constitutional:      General: She is not in acute distress.    Appearance: Normal appearance. She is well-developed. She is not ill-appearing or diaphoretic.  HENT:     Head: Normocephalic and atraumatic.  Eyes:     General: Vision grossly intact. Gaze aligned appropriately.     Pupils: Pupils are equal, round, and reactive to light.  Neck:     Trachea: Trachea and phonation normal.  Cardiovascular:     Rate and Rhythm: Normal rate and regular rhythm.  Pulmonary:     Effort: Pulmonary effort is normal. No respiratory distress.     Breath sounds: Normal breath sounds.  Abdominal:     General: There is no distension.     Palpations: Abdomen is soft.     Tenderness: There is no abdominal tenderness. There is no guarding or rebound.  Musculoskeletal:        General: Normal range of motion.     Cervical back: Normal range of motion.  Skin:    General: Skin is warm and dry.  Neurological:     Mental Status: She is alert.     GCS: GCS eye subscore is 4. GCS verbal subscore is 5. GCS motor subscore is 6.     Comments: Speech is clear and goal oriented, follows commands Major Cranial nerves without deficit, no facial droop Moves extremities without ataxia, coordination intact  Psychiatric:        Behavior: Behavior normal.     ED Results / Procedures / Treatments    Labs (all labs ordered are listed, but only abnormal results are displayed) Labs Reviewed  CBC - Abnormal; Notable for the following components:      Result Value   RBC 1.44 (*)    HCT 14.9 (*)    MCV 103.5 (*)    MCH 90.3 (*)    MCHC 87.2 (*)    All other components within normal limits  BRAIN NATRIURETIC PEPTIDE - Abnormal; Notable for the following components:   B Natriuretic Peptide 436.0 (*)    All other components within normal limits  TROPONIN I (HIGH SENSITIVITY) - Abnormal; Notable for the following components:   Troponin I (  High Sensitivity) 65 (*)    All other components within normal limits  TROPONIN I (HIGH SENSITIVITY) - Abnormal; Notable for the following components:   Troponin I (High Sensitivity) 68 (*)    All other components within normal limits  RESPIRATORY PANEL BY RT PCR (FLU A&B, COVID)  BASIC METABOLIC PANEL    EKG EKG Interpretation  Date/Time:  Monday May 14 2020 15:40:21 EDT Ventricular Rate:  75 PR Interval:  126 QRS Duration: 68 QT Interval:  378 QTC Calculation: 422 R Axis:   85 Text Interpretation: Normal sinus rhythm with sinus arrhythmia ST & T wave abnormality, consider inferolateral ischemia Abnormal ECG Confirmed by Fredia Sorrow [phone]) on 05/14/2020 4:20:49 PM   Radiology DG Chest 2 View  Result Date: 05/14/2020 CLINICAL DATA:  79 year old female with chest pain. EXAM: CHEST - 2 VIEW COMPARISON:  Chest radiograph dated 05/09/2020. FINDINGS: There are biapical subpleural scarring. No focal consolidation, pleural effusion, pneumothorax. The cardiac silhouette is within limits. Atherosclerotic calcification of the aorta. There is a moderate size hiatal hernia. Degenerative changes of the spine. No acute osseous pathology. Lower thoracic old compression fracture and anterior wedging. IMPRESSION: No active cardiopulmonary disease. Electronically Signed   By: Anner Crete M.D.   On: 05/14/2020 17:45    Procedures Procedures  (including critical care time)  Medications Ordered in ED Medications  isosorbide mononitrate (IMDUR) 24 hr tablet 30 mg (30 mg Oral Given 05/14/20 1853)  aspirin chewable tablet 243 mg (243 mg Oral Given 05/14/20 1621)  morphine 2 MG/ML injection 2 mg (2 mg Intravenous Given 05/14/20 1641)    ED Course  I have reviewed the triage vital signs and the nursing notes.  Pertinent labs & imaging results that were available during my care of the patient were reviewed by me and considered in my medical decision making (see chart for details).  Clinical Course as of May 14 2133  Mon May 14, 2020  1659 Dr. Harrington Challenger   [BM]  2052 Dr. Harrington Challenger   [BM]    Clinical Course User Index [BM] Gari Crown   MDM Rules/Calculators/A&P                         Additional history obtained from: 1. Nursing notes from this visit. 2. Review of electronic medical record. --- I ordered, reviewed and interpreted labs which include: Covid/influenza panel negative BMP within normal limits CBC without anemia or leukocytosis. High-sensitivity troponin 65--68 BNP 436 - Discussed case with cardiologist Dr. Harrington Challenger who recommended addition of Imdur 30 mg daily.  Advises likely secondary to resolution of Takotsubo's last week.  Dr. Harrington Challenger took patient's information and plans to have follow-up.  Patient was reassessed she is resting comfortably no acute distress chest pain-free she is requesting discharge, husband at bedside.  They are agreeable for discharge with Imdur and outpatient follow-ups.  Vital signs within normal limits on reassessment, low suspicion for ACS, PE, dissection or other emergent pathologies at this time.  At this time there does not appear to be any evidence of an acute emergency medical condition and the patient appears stable for discharge with appropriate outpatient follow up. Diagnosis was discussed with patient who verbalizes understanding of care plan and is agreeable to discharge. I have  discussed return precautions with patient and husband who verbalizes understanding. Patient encouraged to follow-up with their PCP and cardiology. All questions answered.  Patient was seen and evaluated by Dr. Rogene Houston during this visit who  agrees with work-up and discharge at this time. ------------------------- Addendum 9:52 PM: Patient's hemoglobin is within normal limits however the RBCs and hematocrit is low today.  Suspect this is dilution during blood draw and line placement.  I discussed this lab abnormality with patient.  She does not wish to have a recheck tonight, she prefers to call her primary care doctor's office tomorrow morning and schedule a recheck of her CBC tomorrow.  She reports she is feeling well advised patient if she develops any symptoms suggestive of anemia she should return to the ER immediately and she stated understanding.  She and her husband will call the PCP office tomorrow morning.  Patient without GI bleed symptoms. Discussed abnormality with Dr. Rogene Houston, agrees with follow-up with PCP for recheck tomorrow and likely dilution lab error.  Note: Portions of this report may have been transcribed using voice recognition software. Every effort was made to ensure accuracy; however, inadvertent computerized transcription errors may still be present. Final Clinical Impression(s) / ED Diagnoses Final diagnoses:  Atypical chest pain    Rx / DC Orders ED Discharge Orders         Ordered    isosorbide mononitrate (IMDUR) 30 MG 24 hr tablet  Daily        05/14/20 2133           Deliah Boston, PA-C 05/14/20 2134    Deliah Boston, PA-C 05/14/20 2154    Fredia Sorrow, MD 05/14/20 [phone]

## 2020-05-14 NOTE — ED Triage Notes (Signed)
C/o chest pain onset states she had an MI week

## 2020-05-14 NOTE — Telephone Encounter (Signed)
Ok, thanks, agree with hospital for evaluation. Dr. Lovena Le

## 2020-05-14 NOTE — Telephone Encounter (Signed)
Patient husband called stating patient was discharged from the hospital last week after a heart attack. Patient did some exercising yesterday and since has been complaining of chest pain and tightness, not feeling well at all and today having a rapid heart rate. Husband states they are already on the way to the hospital but wanted Dr. Lovena Le to be aware.

## 2020-05-14 NOTE — Discharge Instructions (Addendum)
At this time there does not appear to be the presence of an emergent medical condition, however there is always the potential for conditions to change. Please read and follow the below instructions.  Please return to the Emergency Department immediately for any new or worsening symptoms. Please be sure to follow up with your Primary Care Provider within one week regarding your visit today; please call their office to schedule an appointment even if you are feeling better for a follow-up visit. Please take the medication Imdur as prescribed.  Please follow-up with your cardiologist and Dr. Harrington Challenger this week for recheck.  Go to the nearest Emergency Department immediately if: You have fever or chills Your chest pain is worse. You have a cough that gets worse, or you cough up blood. You have very bad (severe) pain in your belly (abdomen). You pass out (faint). You have either of these for no clear reason: Sudden chest discomfort. Sudden discomfort in your arms, back, neck, or jaw. You have shortness of breath at any time. You suddenly start to sweat, or your skin gets clammy. You feel sick to your stomach (nauseous). You throw up (vomit). You suddenly feel lightheaded or dizzy. You feel very weak or tired. Your heart starts to beat fast, or it feels like it is skipping beats. You have any new/concerning or worsening of symptoms   Please read the additional information packets attached to your discharge summary.  Do not take your medicine if  develop an itchy rash, swelling in your mouth or lips, or difficulty breathing; call 911 and seek immediate emergency medical attention if this occurs.  You may review your lab tests and imaging results in their entirety on your MyChart account.  Please discuss all results of fully with your primary care provider and other specialist at your follow-up visit.  Note: Portions of this text may have been transcribed using voice recognition software. Every  effort was made to ensure accuracy; however, inadvertent computerized transcription errors may still be present.

## 2020-05-14 NOTE — ED Provider Notes (Signed)
Medical screening examination/treatment/procedure(s) were conducted as a shared visit with non-physician practitioner(s) and myself.  I personally evaluated the patient during the encounter.  EKG Interpretation  Date/Time:  Monday May 14 2020 15:40:21 EDT Ventricular Rate:  75 PR Interval:  126 QRS Duration: 68 QT Interval:  378 QTC Calculation: 422 R Axis:   85 Text Interpretation: Normal sinus rhythm with sinus arrhythmia ST & T wave abnormality, consider inferolateral ischemia Abnormal ECG Confirmed by Fredia Sorrow [phone]) on 05/14/2020 4:20:49 PM   Patient seen by me along physician assistant.  Patient actually known to me from her most recent admission where she was transferred to [facility] for cardiology for non-STEMI.  Patient's cardiac cath at that time was without significant disease.  We consulted Dr. Harrington Challenger on-call for cardiology based on the fact that the cath was negative.  She is recommending Imdur.  Patient has a syndrome.  And she is got follow-up with cardiology on the 14th.  They concurred with the discharge home.  Patient's troponins here have been 65 and 68 no significant elevation.  Nontoxic no acute distress.  As per cardiology patient stable for discharge home.  Covid testing -2.   Fredia Sorrow, MD 05/14/20 2105

## 2020-05-16 NOTE — Progress Notes (Signed)
Cardiology Office Note   Date:  05/17/2020   ID:  Jeanne Rogers, Jeanne Rogers 03/01/1941, MRN [id number]  PCP:  Erven Colla, DO  Cardiologist:  Pixie Casino, MD EP: None  No chief complaint on file.     History of Present Illness: Jeanne Rogers is a 79 y.o. female with a PMH of recent NSTEMI, found to have takotsubo cardiomyopathy, HTN, HLD, spontaneous PTX, and TIA, who presents for post-hospital follow-up.   She was recently admitted to [facility] from 05/10/20-05/11/20 after presenting with chest pain. She was found to have an NSTEMI with HsTrop peak of 4288. Echo showed EF 30-35% with LAD territory WMA. She was taken to the cath lab where she was found to have normal coronaries with LV apical ballooning and a hyperdynamic base, suggestive of Takatsubo cardiomyopathy. She was started on metoprolol succinate 12.5mg  daily, though soft blood pressures limited addition of ACEi/ARB/ARNI.   She presented to the ED 05/14/20 with complaints of recurrent chest pain at rest. Case discussed with Dr. Harrington Challenger who recommended addition of imdur 30mg  daily.   She presents today with her husband for post-hospital follow-up. She has been doing well since her ED visit 05/14/20. She notes rare chest pressure which is relieved with rest and has not needed any SL nitro. She has been walking with her husband without difficulty. No complaints of SOB, DOE, LE edema, orthopnea, PND, dizziness, lightheadedness, syncope, or palpitations. Husband reports SBP in the 120s at home. We discussed if BP trends upwards we may have room to add additional GDMT for management of her cardiomyopathy. He will notify the office if that's the case.     Past Medical History:  Diagnosis Date  . Anemia   . Collapse, lung   . Complication of anesthesia    very sensitive to anesthesia  . Hyperlipidemia   . Osteoporosis   . PUD (peptic ulcer disease)   . Spontaneous pneumothorax     Past Surgical History:  Procedure  Laterality Date  . COLONOSCOPY N/A 01/04/2014   Procedure: COLONOSCOPY;  Surgeon: Rogene Houston, MD;  [facility];  Service: Endoscopy;  Laterality: N/A;  200  . ESOPHAGOGASTRODUODENOSCOPY N/A 01/04/2014   Procedure: ESOPHAGOGASTRODUODENOSCOPY (EGD);  Surgeon: Rogene Houston, MD;  [facility];  Service: Endoscopy;  Laterality: N/A;  . hysterectomy    . LEFT HEART CATH AND CORONARY ANGIOGRAPHY N/A 05/10/2020   Procedure: LEFT HEART CATH AND CORONARY ANGIOGRAPHY;  Surgeon: Belva Crome, MD;  Location: [place] [facility];  Service: Cardiovascular;  Laterality: N/A;  . MASS EXCISION Right 02/07/2016   Procedure: EXCISION MASS RIGHT INDEX FINGER AND RIGHT HAND MASS;  Surgeon: Leanora Cover, MD;  Location: [place];  Service: Orthopedics;  Laterality: Right;  . OOPHORECTOMY Bilateral 2005     Current Outpatient Medications  Medication Sig Dispense Refill  . alendronate (FOSAMAX) 70 MG tablet TAKE 1 TABLET BY MOUTH EVERY WEEK 12 tablet 3  . aspirin EC 81 MG EC tablet Take 1 tablet (81 mg total) by mouth daily. Swallow whole. 90 tablet 3  . citalopram (CELEXA) 10 MG tablet Take 1 tablet (10 mg total) by mouth daily. 90 tablet 3  . ezetimibe (ZETIA) 10 MG tablet Take 1 tablet (10 mg total) by mouth daily. 90 tablet 1  . isosorbide mononitrate (IMDUR) 30 MG 24 hr tablet Take 1 tablet (30 mg total) by mouth daily. 30 tablet 0  . memantine (  NAMENDA) 10 MG tablet Take 1 tablet (10 mg total) by mouth 2 (two) times daily. 60 tablet 11  . metoprolol succinate (TOPROL-XL) 25 MG 24 hr tablet Take 0.5 tablets (12.5 mg total) by mouth daily. 90 tablet 3  . zolpidem (AMBIEN) 5 MG tablet TAKE 1 TABLET(10 MG) BY MOUTH AT BEDTIME AS NEEDED FOR SLEEP (Patient taking differently: Take 5 mg by mouth at bedtime as needed for sleep. ) 30 tablet 2   No current facility-administered medications for this visit.    Allergies:   Methergine [methylergonovine maleate]    Social  History:  The patient  reports that she has quit smoking. Her smoking use included cigarettes. She has never used smokeless tobacco. She reports that she does not drink alcohol and does not use drugs.   Family History:  The patient's family history includes Hyperlipidemia in her mother; Hypertension in her father.    ROS:  Please see the history of present illness.   Otherwise, review of systems are positive for none.   All other systems are reviewed and negative.    PHYSICAL EXAM: VS:  BP 134/82   Pulse 71   Ht 5\' 4"  (1.626 m)   Wt 116 lb 9.6 oz (52.9 kg)   SpO2 97%   BMI 20.01 kg/m  , BMI Body mass index is 20.01 kg/m. GEN: Well nourished, well developed, in no acute distress HEENT: sclera anicteric Neck: no JVD, carotid bruits, or masses Cardiac: RRR; no murmurs, rubs, or gallops, no edema; bruising to right radial cath site Respiratory:  clear to auscultation bilaterally, normal work of breathing GI: soft, nontender, nondistended, + BS MS: no deformity or atrophy Skin: warm and dry, no rash Neuro:  Strength and sensation are intact Psych: euthymic mood, full affect   EKG:  EKG is not ordered today.   Recent Labs: 08/25/2019: TSH 2.521 08/26/2019: Magnesium 2.1 05/09/2020: ALT 15 05/14/2020: B Natriuretic Peptide 436.0; BUN 16; Creatinine, Ser 0.91; Hemoglobin 13.0; Platelets 201; Potassium 4.1; Sodium 138    Lipid Panel    Component Value Date/Time   CHOL 231 (H) 05/10/2020 1034   CHOL 253 (H) 11/13/2017 0812   TRIG 106 05/10/2020 1034   HDL 71 05/10/2020 1034   HDL 84 11/13/2017 0812   CHOLHDL 3.3 05/10/2020 1034   VLDL 21 05/10/2020 1034   LDLCALC 139 (H) 05/10/2020 1034   LDLCALC 157 (H) 11/13/2017 0812      Wt Readings from Last 3 Encounters:  05/17/20 116 lb 9.6 oz (52.9 kg)  05/14/20 119 lb (54 kg)  05/11/20 115 lb 14.4 oz (52.6 kg)      Other studies Reviewed: Additional studies/ records that were reviewed today include:   Echocardiogram  05/10/20: 1. Left ventricular ejection fraction, by estimation, is 30 to 35%. The  left ventricle has moderately decreased function. The left ventricle  demonstrates regional wall motion abnormalities with mid to apical  anteroseptal and inferoseptal akinesis.  Apical lateral, apical anterior, and apical inferior akinesis. Akinesis of  the true apex. This pattern suggests LAD territory infarction. No LV  thrombus visualized. There is mild left ventricular hypertrophy. Left  ventricular diastolic parameters are  consistent with Grade I diastolic dysfunction (impaired relaxation).  2. Right ventricular systolic function is normal. The right ventricular  size is normal. There is mildly elevated pulmonary artery systolic  pressure. The estimated right ventricular systolic pressure is 44.8 mmHg.  3. The mitral valve is normal in structure. No evidence of mitral valve  regurgitation. No evidence of mitral stenosis.  4. The aortic valve is tricuspid. Aortic valve regurgitation is not  visualized. No aortic stenosis is present.  5. A small pericardial effusion is present, primarily adjacent to the RV  free wall.  6. The inferior vena cava is normal in size with <50% respiratory  variability, suggesting right atrial pressure of 8 mmHg.   Left heart catheterization 05/10/20:  Right dominant coronary anatomy  Widely patent left main  Widely patent LAD  Widely patent circumflex  Widely patent RCA  Coronary tortuosity  Apical ballooning/Takotsubo cardiomyopathy with LVEDP 6 mmHg. EF 30 to 35%.  RECOMMENDATIONS:   Supportive therapy  Wean and DC nitro as tolerated given low LVEDP.  Convert anticoagulation to subcu.     ASSESSMENT AND PLAN:  1. Takotsubo cardiomyopathy: No recurrent chest pain since addition of imdur - Continue metoprolol succinate and imdur - Plan to repeat an echocardiogram in 3 months to evaluate for improvement in LV function - Can consider  addition of ARB if BP trends upwards - will hold off for now as SBP is typically in the 120s.  2. HTN:BP 134/82 today.  - Continue metoprolol succinate 12.5mg  daily  3. HLD:LDL 139 05/10/20; intolerant to statins, currently on zetia. Given no evidence of CAD on LHC she was not referred to the lipid clinic - Continue zetia  4. WTK:TCCEQFDV 08/2019.  -Continue aspirin  - Continue zetia  5. Dementia:mild.  - Continue home citalopram and memantine   Current medicines are reviewed at length with the patient today.  The patient does not have concerns regarding medicines.  The following changes have been made:  As above  Labs/ tests ordered today include:   Orders Placed This Encounter  Procedures  . ECHOCARDIOGRAM COMPLETE     Disposition:   FU with one of our [place] providers in 3 months following her repeat echocardiogram  Signed, Abigail Butts, PA-C  05/17/2020 11:17 AM

## 2020-05-17 ENCOUNTER — Encounter: Payer: Self-pay | Admitting: Medical

## 2020-05-17 ENCOUNTER — Ambulatory Visit (INDEPENDENT_AMBULATORY_CARE_PROVIDER_SITE_OTHER): Payer: Medicare Other | Admitting: Medical

## 2020-05-17 ENCOUNTER — Other Ambulatory Visit: Payer: Self-pay

## 2020-05-17 VITALS — BP 134/82 | HR 71 | Ht 64.0 in | Wt 116.6 lb

## 2020-05-17 DIAGNOSIS — E785 Hyperlipidemia, unspecified: Secondary | ICD-10-CM

## 2020-05-17 DIAGNOSIS — G459 Transient cerebral ischemic attack, unspecified: Secondary | ICD-10-CM | POA: Diagnosis not present

## 2020-05-17 DIAGNOSIS — I5181 Takotsubo syndrome: Secondary | ICD-10-CM | POA: Diagnosis not present

## 2020-05-17 DIAGNOSIS — I10 Essential (primary) hypertension: Secondary | ICD-10-CM

## 2020-05-17 NOTE — Patient Instructions (Addendum)
Medication Instructions:  The current medical regimen is effective;  continue present plan and medications as directed. Please refer to the Current Medication list given to you today.; *If you need a refill on your cardiac medications before your next appointment, please call your pharmacy*  Lab Work:    NONE      Testing/Procedures:  Echocardiogram(IN 3 MONTHS) - Your physician has requested that you have an echocardiogram. Echocardiography is a painless test that uses sound waves to create images of your heart. It provides your doctor with information about the size and shape of your heart and how well your heart's chambers and valves are working. This procedure takes approximately one hour. There are no restrictions for this procedure. This will be performed at our [facility] location - [address], [address].  Follow-Up: Your next appointment:  AFTER ECHO IN [place] In Person with You may see one of the following Advanced Practice Providers on your designated Care Team: ANY MD([place]) OR Bernerd Pho, PA-C Ermalinda Barrios, PA-C   At [facility], you and your health needs are our priority.  As part of our continuing mission to provide you with exceptional heart care, we have created designated Provider Care Teams.  These Care Teams include your primary Cardiologist (physician) and Advanced Practice Providers (APPs -  Physician Assistants and Nurse Practitioners) who all work together to provide you with the care you need, when you need it.

## 2020-05-23 ENCOUNTER — Other Ambulatory Visit: Payer: Self-pay

## 2020-05-23 ENCOUNTER — Encounter: Payer: Self-pay | Admitting: Family Medicine

## 2020-05-23 ENCOUNTER — Ambulatory Visit (INDEPENDENT_AMBULATORY_CARE_PROVIDER_SITE_OTHER): Payer: Medicare Other | Admitting: Family Medicine

## 2020-05-23 VITALS — BP 114/82 | HR 98 | Temp 98.0°F | Ht 64.0 in | Wt 117.2 lb

## 2020-05-23 DIAGNOSIS — R0789 Other chest pain: Secondary | ICD-10-CM

## 2020-05-23 DIAGNOSIS — I5181 Takotsubo syndrome: Secondary | ICD-10-CM

## 2020-05-23 DIAGNOSIS — N3000 Acute cystitis without hematuria: Secondary | ICD-10-CM | POA: Diagnosis not present

## 2020-05-23 DIAGNOSIS — R300 Dysuria: Secondary | ICD-10-CM | POA: Diagnosis not present

## 2020-05-23 LAB — POCT URINALYSIS DIPSTICK
Protein, UA: POSITIVE — AB
Spec Grav, UA: >=1.03 — AB (ref 1.010–1.025)
pH, UA: 5 (ref 5.0–8.0)

## 2020-05-23 MED ORDER — CEPHALEXIN 500 MG PO CAPS: 500.0000 mg | ORAL_CAPSULE | Freq: Two times a day (BID) | ORAL | 0 refills | Status: DC

## 2020-05-23 NOTE — Progress Notes (Signed)
Patient ID: Jeanne Rogers, female    DOB: 04-Dec-1940, 79 y.o.   MRN: [id number]   Chief Complaint  Patient presents with  . Chest Pain   Subjective:    HPI Pt here for ER follow up for chest pain. Pt went to [facility] on 05/14/20 for chest pain. Oct 6-8 pt was at [place]. Pt is doing better. Chest pain and was given Imdur for pain.  Pt states that legs have been feeling sore. Has been going for a few weeks. Was taking a statin but she has since stopped taking statins. Pt was prescribed a different type of cholesterol med.   Pt stating she stopped the statin. When dc from hospital just had her taking zetia. Pain improved in legs, since being on zetia.  Pt stating feeling vaginal itching at times and feeling like she has pressure in lower abdomen with urination.   Medical History Jeanne Rogers has a past medical history of Anemia, Collapse, lung, Complication of anesthesia, Hyperlipidemia, Osteoporosis, PUD (peptic ulcer disease), and Spontaneous pneumothorax.   Outpatient Encounter Medications as of 05/23/2020  Medication Sig  . alendronate (FOSAMAX) 70 MG tablet TAKE 1 TABLET BY MOUTH EVERY WEEK  . aspirin EC 81 MG EC tablet Take 1 tablet (81 mg total) by mouth daily. Swallow whole.  . citalopram (CELEXA) 10 MG tablet Take 1 tablet (10 mg total) by mouth daily.  Marland Kitchen ezetimibe (ZETIA) 10 MG tablet Take 1 tablet (10 mg total) by mouth daily.  . isosorbide mononitrate (IMDUR) 30 MG 24 hr tablet Take 1 tablet (30 mg total) by mouth daily.  . memantine (NAMENDA) 10 MG tablet Take 1 tablet (10 mg total) by mouth 2 (two) times daily.  . metoprolol succinate (TOPROL-XL) 25 MG 24 hr tablet Take 0.5 tablets (12.5 mg total) by mouth daily.  Marland Kitchen zolpidem (AMBIEN) 5 MG tablet TAKE 1 TABLET(10 MG) BY MOUTH AT BEDTIME AS NEEDED FOR SLEEP (Patient taking differently: Take 5 mg by mouth at bedtime as needed for sleep. )  . cephALEXin (KEFLEX) 500 MG capsule Take 1 capsule (500 mg total) by mouth 2  (two) times daily.   No facility-administered encounter medications on file as of 05/23/2020.     Review of Systems  Constitutional: Negative for chills and fever.  HENT: Negative for congestion, rhinorrhea and sore throat.   Respiratory: Negative for cough, shortness of breath and wheezing.   Cardiovascular: Negative for chest pain and leg swelling.  Gastrointestinal: Negative for abdominal pain, diarrhea, nausea and vomiting.  Genitourinary: Positive for dysuria. Negative for difficulty urinating, flank pain, frequency, hematuria, urgency, vaginal bleeding and vaginal discharge.  Musculoskeletal: Negative for arthralgias and back pain.  Skin: Negative for rash.  Neurological: Negative for dizziness, weakness and headaches.     Vitals BP 114/82   Pulse 98   Temp 98 F (36.7 C)   Ht 5\' 4"  (1.626 m)   Wt 117 lb 3.2 oz (53.2 kg)   SpO2 97%   BMI 20.12 kg/m   Objective:   Physical Exam Vitals and nursing note reviewed.  Constitutional:      General: She is not in acute distress.    Appearance: Normal appearance. She is not ill-appearing.  HENT:     Head: Normocephalic and atraumatic.     Nose: Nose normal.     Mouth/Throat:     Mouth: Mucous membranes are moist.     Pharynx: Oropharynx is clear.  Eyes:  Extraocular Movements: Extraocular movements intact.     Conjunctiva/sclera: Conjunctivae normal.     Pupils: Pupils are equal, round, and reactive to light.  Cardiovascular:     Rate and Rhythm: Normal rate and regular rhythm.     Pulses: Normal pulses.     Heart sounds: Normal heart sounds.  Pulmonary:     Effort: Pulmonary effort is normal.     Breath sounds: Normal breath sounds. No wheezing, rhonchi or rales.  Musculoskeletal:        General: Normal range of motion.     Right lower leg: No edema.     Left lower leg: No edema.  Skin:    General: Skin is warm and dry.     Findings: No lesion or rash.  Neurological:     General: No focal deficit present.      Mental Status: She is alert and oriented to person, place, and time.  Psychiatric:        Mood and Affect: Mood normal.        Behavior: Behavior normal.      Assessment and Plan   1. Atypical chest pain  2. Acute cystitis without hematuria - POCT Urinalysis Dipstick - Urine Culture - cephALEXin (KEFLEX) 500 MG capsule; Take 1 capsule (500 mg total) by mouth 2 (two) times daily.  Dispense: 14 capsule; Refill: 0  3. Takotsubo cardiomyopathy  Chest pain has resolved.  Feeling better since her ER visit.  Cont to monitor and f/u cards as scheduled.  ER notes, labs, and imaging reviewed. Pt was started on Imdur after consultation in ER with Dr. Harrington Challenger with cardiology. Has f/u with cards on 05/17/20.  UTI- Urine - showing mod leuk, pos protein, on micro has bacteria.  will treat with keflex and ordered urine cx.  F/u 55mo or prn.

## 2020-05-25 LAB — URINE CULTURE

## 2020-05-31 DIAGNOSIS — Z23 Encounter for immunization: Secondary | ICD-10-CM | POA: Diagnosis not present

## 2020-06-09 ENCOUNTER — Encounter: Payer: Self-pay | Admitting: Family Medicine

## 2020-06-11 ENCOUNTER — Other Ambulatory Visit: Payer: Self-pay | Admitting: *Deleted

## 2020-06-11 MED ORDER — ISOSORBIDE MONONITRATE ER 30 MG PO TB24
30.0000 mg | ORAL_TABLET | Freq: Every day | ORAL | 3 refills | Status: DC
Start: 2020-06-11 — End: 2020-10-18

## 2020-06-13 ENCOUNTER — Telehealth: Payer: Self-pay | Admitting: *Deleted

## 2020-06-13 NOTE — Telephone Encounter (Signed)
Pt's daughter sent in a letter on 10/18 and pt had appt on 10/20. Dr Lovena Le sent a note on 10/29 to check on tramadol usage. I called [facility] and they have on file that she filled #14 on 12/22/19, #24 on 01/07/20 and #60 on 02/08/20. Placed letter back in dr taylor's folder for review.

## 2020-06-13 NOTE — Telephone Encounter (Signed)
Does the daughters have medical release to speak about her medical concerns? Joneen Roach?  Thanks,   Dr. Darene Lamer

## 2020-06-14 NOTE — Telephone Encounter (Signed)
Pls call the daughter to let her know that pt has not been requesting much tramadol, but last script for tramadol was for 60 tab.  And she didn't request refill at most recent visit.  Also, I didn't get her note about the change she was requesting on her anxiety medication, but will discuss it at next visit about changing it to lexapro if she would like.  I appreciated her letter, and if she has questions or concerns she can call or come in with her mother to the appts.   If she sends a letter by fax, I might not get it in time for her mother's appt.   Thx,   Dr. Lovena Le

## 2020-06-14 NOTE — Telephone Encounter (Signed)
Yes Beckie Viscardi is on pt's dpr to release information

## 2020-06-14 NOTE — Telephone Encounter (Signed)
Patient daughter notified of Dr. Tanna Furry message and verbalized understanding.

## 2020-07-05 ENCOUNTER — Encounter (HOSPITAL_COMMUNITY): Payer: Self-pay

## 2020-07-05 ENCOUNTER — Encounter (HOSPITAL_COMMUNITY)
Admission: RE | Admit: 2020-07-05 | Discharge: 2020-07-05 | Disposition: A | Discharge status: Home / Self Care | Payer: Medicare Other | Source: Ambulatory Visit | Attending: Cardiology | Admitting: Cardiology

## 2020-07-05 ENCOUNTER — Other Ambulatory Visit: Payer: Self-pay

## 2020-07-05 VITALS — BP 130/50 | HR 97 | Ht 64.0 in | Wt 114.6 lb

## 2020-07-05 DIAGNOSIS — I214 Non-ST elevation (NSTEMI) myocardial infarction: Secondary | ICD-10-CM | POA: Diagnosis not present

## 2020-07-05 NOTE — Progress Notes (Signed)
Cardiac/Pulmonary Rehab Medication Review by a Pharmacist  Does the patient  feel that his/her medications are working for him/her?  yes  Has the patient been experiencing any side effects to the medications prescribed?  no  Does the patient measure his/her own blood pressure or blood glucose at home?  yes   Does the patient have any problems obtaining medications due to transportation or finances?   no  Understanding of regimen: good Understanding of indications: good Potential of compliance: good  Questions asked to Determine Patient Understanding of Medication Regimen:  1. What is the name of the medication?  2. What is the medication used for?  3. When should it be taken?  4. How much should be taken?  5. How will you take it?  6. What side effects should you report?  Understanding Defined as: Excellent: All questions above are correct Good: Questions 1-4 are correct Fair: Questions 1-2 are correct  Poor: 1 or none of the above questions are correct   Pharmacist comments: Overall, patient/spouse with good understanding of regiment and high compliance.    Ramond Craver 07/05/2020 9:01 AM

## 2020-07-05 NOTE — Progress Notes (Addendum)
Cardiac Individual Treatment Plan  Patient Details  Name: Jeanne Rogers MRN: [id number] Date of Birth: 01-23-1941 Referring Provider:     [place] from 07/05/2020 in [place]  Referring Provider Dr. Harl Bowie      Initial Encounter Date:    CARDIAC REHAB PHASE II ORIENTATION from 07/05/2020 in [facility]  Date 07/05/20      Visit Diagnosis: NSTEMI (non-ST elevated myocardial infarction) ([place])  Patient's Home Medications on Admission:  Current Outpatient Medications:  .  alendronate (FOSAMAX) 70 MG tablet, TAKE 1 TABLET BY MOUTH EVERY WEEK, Disp: 12 tablet, Rfl: 3 .  aspirin EC 81 MG EC tablet, Take 1 tablet (81 mg total) by mouth daily. Swallow whole., Disp: 90 tablet, Rfl: 3 .  cephALEXin (KEFLEX) 500 MG capsule, Take 1 capsule (500 mg total) by mouth 2 (two) times daily. (Patient not taking: Reported on 07/05/2020), Disp: 14 capsule, Rfl: 0 .  citalopram (CELEXA) 10 MG tablet, Take 1 tablet (10 mg total) by mouth daily., Disp: 90 tablet, Rfl: 3 .  ezetimibe (ZETIA) 10 MG tablet, Take 1 tablet (10 mg total) by mouth daily., Disp: 90 tablet, Rfl: 1 .  isosorbide mononitrate (IMDUR) 30 MG 24 hr tablet, Take 1 tablet (30 mg total) by mouth daily., Disp: 30 tablet, Rfl: 3 .  memantine (NAMENDA) 10 MG tablet, Take 1 tablet (10 mg total) by mouth 2 (two) times daily., Disp: 60 tablet, Rfl: 11 .  metoprolol succinate (TOPROL-XL) 25 MG 24 hr tablet, Take 0.5 tablets (12.5 mg total) by mouth daily., Disp: 90 tablet, Rfl: 3 .  zolpidem (AMBIEN) 5 MG tablet, TAKE 1 TABLET(10 MG) BY MOUTH AT BEDTIME AS NEEDED FOR SLEEP (Patient taking differently: Take 5 mg by mouth at bedtime as needed for sleep. ), Disp: 30 tablet, Rfl: 2  Past Medical History: Past Medical History:  Diagnosis Date  . Anemia   . Collapse, lung   . Complication of anesthesia    very sensitive to anesthesia  . Hyperlipidemia   . Osteoporosis   . PUD  (peptic ulcer disease)   . Spontaneous pneumothorax     Tobacco Use: Social History   Tobacco Use  Smoking Status Former Smoker  . Types: Cigarettes  Smokeless Tobacco Never Used    Labs: Recent Review Flowsheet Data    Labs for ITP Cardiac and Pulmonary Rehab Latest Ref Rng & Units 10/18/2014 07/21/2016 11/13/2017 08/25/2019 05/10/2020   Cholestrol 0 - 200 mg/dL 248(H) 251(H) 253(H) 240(H) 231(H)   LDLCALC 0 - 99 mg/dL 164(H) 156(H) 157(H) 150(H) 139(H)   HDL >40 mg/dL 71 77 84 74 71   Trlycerides <150 mg/dL 65 88 60 82 106   Hemoglobin A1c 4.8 - 5.6 % - - - 5.2 5.4      Capillary Blood Glucose: Lab Results  Component Value Date   GLUCAP 103 (H) 08/25/2019     Exercise Target Goals: Exercise Program Goal: Individual exercise prescription set using results from initial 6 min walk test and THRR while considering  patient's activity barriers and safety.   Exercise Prescription Goal: Starting with aerobic activity 30 plus minutes a day, 3 days per week for initial exercise prescription. Provide home exercise prescription and guidelines that participant acknowledges understanding prior to discharge.  Activity Barriers & Risk Stratification:   6 Minute Walk:  6 Minute Walk    Row Name 07/05/20 1000         6 Minute Walk  Phase Initial     Distance 1250 feet     Walk Time 6 minutes     # of Rest Breaks 0     MPH 2.4     METS 2.7     RPE 8     VO2 Peak 9.56     Symptoms No     Resting HR 67 bpm     Resting BP 130/50     Resting Oxygen Saturation  95 %     Exercise Oxygen Saturation  during 6 min walk 95 %     Max Ex. HR 86 bpm     Max Ex. BP 152/60     2 Minute Post BP 130/58            Oxygen Initial Assessment:   Oxygen Re-Evaluation:   Oxygen Discharge (Final Oxygen Re-Evaluation):   Initial Exercise Prescription:  Initial Exercise Prescription - 07/05/20 1000      Date of Initial Exercise RX and Referring Provider   Date 07/05/20     Referring Provider Dr. Harl Bowie    Expected Discharge Date 09/27/20      NuStep   Level 1    SPM 60    Minutes 22      Arm Ergometer   Level 1    RPM 60    Minutes 17      Prescription Details   Duration Progress to 30 minutes of continuous aerobic without signs/symptoms of physical distress      Intensity   THRR 40-80% of Max Heartrate 57-114    Ratings of Perceived Exertion 11-13      Progression   Progression Continue progressive overload as per policy without signs/symptoms or physical distress.      Resistance Training   Training Prescription Yes    Weight 2    Reps 10-15           Perform Capillary Blood Glucose checks as needed.  Exercise Prescription Changes:   Exercise Comments:   Exercise Goals and Review:   Exercise Goals    Row Name 07/05/20 1004             Exercise Goals   Increase Physical Activity Yes       Intervention Provide advice, education, support and counseling about physical activity/exercise needs.;Develop an individualized exercise prescription for aerobic and resistive training based on initial evaluation findings, risk stratification, comorbidities and participant's personal goals.       Expected Outcomes Short Term: Attend rehab on a regular basis to increase amount of physical activity.;Long Term: Add in home exercise to make exercise part of routine and to increase amount of physical activity.;Long Term: Exercising regularly at least 3-5 days a week.       Increase Strength and Stamina Yes       Intervention Provide advice, education, support and counseling about physical activity/exercise needs.;Develop an individualized exercise prescription for aerobic and resistive training based on initial evaluation findings, risk stratification, comorbidities and participant's personal goals.       Expected Outcomes Short Term: Increase workloads from initial exercise prescription for resistance, speed, and METs.;Short Term: Perform  resistance training exercises routinely during rehab and add in resistance training at home;Long Term: Improve cardiorespiratory fitness, muscular endurance and strength as measured by increased METs and functional capacity (6MWT)       Able to understand and use rate of perceived exertion (RPE) scale Yes       Intervention Provide education and explanation on  how to use RPE scale       Expected Outcomes Short Term: Able to use RPE daily in rehab to express subjective intensity level;Long Term:  Able to use RPE to guide intensity level when exercising independently       Knowledge and understanding of Target Heart Rate Range (THRR) Yes       Intervention Provide education and explanation of THRR including how the numbers were predicted and where they are located for reference       Expected Outcomes Short Term: Able to state/look up THRR;Long Term: Able to use THRR to govern intensity when exercising independently;Short Term: Able to use daily as guideline for intensity in rehab       Able to check pulse independently Yes       Intervention Provide education and demonstration on how to check pulse in carotid and radial arteries.;Review the importance of being able to check your own pulse for safety during independent exercise       Expected Outcomes Short Term: Able to explain why pulse checking is important during independent exercise;Long Term: Able to check pulse independently and accurately       Understanding of Exercise Prescription Yes       Intervention Provide education, explanation, and written materials on patient's individual exercise prescription       Expected Outcomes Short Term: Able to explain program exercise prescription;Long Term: Able to explain home exercise prescription to exercise independently              Exercise Goals Re-Evaluation :    Discharge Exercise Prescription (Final Exercise Prescription Changes):   Nutrition:  Target Goals: Understanding of nutrition  guidelines, daily intake of sodium 1500mg , cholesterol 200mg , calories 30% from fat and 7% or less from saturated fats, daily to have 5 or more servings of fruits and vegetables.  Biometrics:  Pre Biometrics - 07/05/20 1005      Pre Biometrics   Height 5\' 4"  (1.626 m)    Weight 52 kg    Waist Circumference 28 inches    Hip Circumference 34 inches    Waist to Hip Ratio 0.82 %    BMI (Calculated) 19.67    Triceps Skinfold 14 mm    % Body Fat 29.6 %    Grip Strength 15.9 kg    Flexibility --   back pain so did not complete   Single Leg Stand 4 seconds            Nutrition Therapy Plan and Nutrition Goals:  Nutrition Therapy & Goals - 07/05/20 0956      Personal Nutrition Goals   Comments Patient scored 22 on her medificts diet assessment. She says she trys to eat healthy and her husband was concerned she does not eat enough. They still do some cooking at home and eat out some as well. Scores reviewed with patient and discusssed handout provided regarding healthier choices.      Intervention Plan   Intervention Nutrition handout(s) given to patient.           Nutrition Assessments:  Nutrition Assessments - 07/05/20 0958      MEDFICTS Scores   Pre Score 22          MEDIFICTS Score Key:  ?70 Need to make dietary changes   40-70 Heart Healthy Diet  ? 40 Therapeutic Level Cholesterol Diet   Picture Your Plate Scores:  <57 Unhealthy dietary pattern with much room for improvement.  41-50 Dietary pattern unlikely  to meet recommendations for good health and room for improvement.  51-60 More healthful dietary pattern, with some room for improvement.   >60 Healthy dietary pattern, although there may be some specific behaviors that could be improved.    Nutrition Goals Re-Evaluation:   Nutrition Goals Discharge (Final Nutrition Goals Re-Evaluation):   Psychosocial: Target Goals: Acknowledge presence or absence of significant depression and/or stress,  maximize coping skills, provide positive support system. Participant is able to verbalize types and ability to use techniques and skills needed for reducing stress and depression.  Initial Review & Psychosocial Screening:  Initial Psych Review & Screening - 07/05/20 0943      Initial Review   Current issues with None Identified      Family Dynamics   Good Support System? Yes    Comments Patient lives with her husband of 28 years. She reports he and her children are her support system along with some friends. She is very active in her community and has a positive outlook. She denies any depression or anxiety. She was evaluated by a neurologist in June 2021 for mild memory loss and irritability. She was diagnosed with mild cognitive impairment and was started on Citalopram 10 mg daily to help wth her mood and irritability. She also takes Namenda 10 mg BID. Will continue to monitor.      Barriers   Psychosocial barriers to participate in program There are no identifiable barriers or psychosocial needs.      Screening Interventions   Interventions Encouraged to exercise    Expected Outcomes Short Term goal: Identification and review with participant of any Quality of Life or Depression concerns found by scoring the questionnaire.;Long Term goal: The participant improves quality of Life and PHQ9 Scores as seen by post scores and/or verbalization of changes           Quality of Life Scores:  Quality of Life - 07/05/20 0959      Quality of Life   Select Quality of Life      Quality of Life Scores   Health/Function Pre 26.62 %    Socioeconomic Pre 28.75 %    Psych/Spiritual Pre 26.5 %    Family Pre 28.8 %    GLOBAL Pre 27.38 %          Scores of 19 and below usually indicate a poorer quality of life in these areas.  A difference of  2-3 points is a clinically meaningful difference.  A difference of 2-3 points in the total score of the Quality of Life Index has been associated with  significant improvement in overall quality of life, self-image, physical symptoms, and general health in studies assessing change in quality of life.  PHQ-9: Recent Review Flowsheet Data    Depression screen [facility] 2/9 07/05/2020 04/16/2020 11/09/2017 07/23/2016   Decreased Interest 0 0 0 0   Down, Depressed, Hopeless 0 1 0 0   PHQ - 2 Score 0 1 0 0   Altered sleeping 1 1 - -   Tired, decreased energy 1 0 - -   Change in appetite 0 0 - -   Feeling bad or failure about yourself  0 0 - -   Trouble concentrating 0 0 - -   Moving slowly or fidgety/restless 0 0 - -   Suicidal thoughts 0 0 - -   PHQ-9 Score 2 2 - -   Difficult doing work/chores Not difficult at all Not difficult at all - -  Interpretation of Total Score  Total Score Depression Severity:  1-4 = Minimal depression, 5-9 = Mild depression, 10-14 = Moderate depression, 15-19 = Moderately severe depression, 20-27 = Severe depression   Psychosocial Evaluation and Intervention:  Psychosocial Evaluation - 07/05/20 0955      Psychosocial Evaluation & Interventions   Interventions Stress management education;Relaxation education;Encouraged to exercise with the program and follow exercise prescription    Comments Patient has no psychosocial barriers identified at her orientation visit. Her initial QOL socre was 27.38 overal. and her PHQ-9 score was 2. Will continue to monitor.    Expected Outcomes Patient will have no psychosocial issues identified at discharge.    Continue Psychosocial Services  No Follow up required           Psychosocial Re-Evaluation:   Psychosocial Discharge (Final Psychosocial Re-Evaluation):   Vocational Rehabilitation: Provide vocational rehab assistance to qualifying candidates.   Vocational Rehab Evaluation & Intervention:  Vocational Rehab - 07/05/20 0959      Initial Vocational Rehab Evaluation & Intervention   Assessment shows need for Vocational Rehabilitation No      Vocational Rehab  Re-Evaulation   Comments Patient is retired and is not interest in returning to work. She does not need vocational rehab.           Education: Education Goals: Education classes will be provided on a weekly basis, covering required topics. Participant will state understanding/return demonstration of topics presented.  Learning Barriers/Preferences:  Learning Barriers/Preferences - 07/05/20 8366      Learning Barriers/Preferences   Learning Barriers None    Learning Preferences Video;Audio;Written Material           Education Topics: Hypertension, Hypertension Reduction -Define heart disease and high blood pressure. Discus how high blood pressure affects the body and ways to reduce high blood pressure.   Exercise and Your Heart -Discuss why it is important to exercise, the FITT principles of exercise, normal and abnormal responses to exercise, and how to exercise safely.   Angina -Discuss definition of angina, causes of angina, treatment of angina, and how to decrease risk of having angina.   Cardiac Medications -Review what the following cardiac medications are used for, how they affect the body, and side effects that may occur when taking the medications.  Medications include Aspirin, Beta blockers, calcium channel blockers, ACE Inhibitors, angiotensin receptor blockers, diuretics, digoxin, and antihyperlipidemics.   Congestive Heart Failure -Discuss the definition of CHF, how to live with CHF, the signs and symptoms of CHF, and how keep track of weight and sodium intake.   Heart Disease and Intimacy -Discus the effect sexual activity has on the heart, how changes occur during intimacy as we age, and safety during sexual activity.   Smoking Cessation / COPD -Discuss different methods to quit smoking, the health benefits of quitting smoking, and the definition of COPD.   Nutrition I: Fats -Discuss the types of cholesterol, what cholesterol does to the heart, and how  cholesterol levels can be controlled.   Nutrition II: Labels -Discuss the different components of food labels and how to read food label   Heart Parts/Heart Disease and PAD -Discuss the anatomy of the heart, the pathway of blood circulation through the heart, and these are affected by heart disease.   Stress I: Signs and Symptoms -Discuss the causes of stress, how stress may lead to anxiety and depression, and ways to limit stress.   Stress II: Relaxation -Discuss different types of relaxation techniques to limit stress.  Warning Signs of Stroke / TIA -Discuss definition of a stroke, what the signs and symptoms are of a stroke, and how to identify when someone is having stroke.   Knowledge Questionnaire Score:  Knowledge Questionnaire Score - 07/05/20 0958      Knowledge Questionnaire Score   Pre Score 16/24           Core Components/Risk Factors/Patient Goals at Admission:  Personal Goals and Risk Factors at Admission - 07/05/20 0959      Core Components/Risk Factors/Patient Goals on Admission    Weight Management Weight Loss    Personal Goal Other Yes    Personal Goal Get stronger. Get healthier. Be able to do her ADL's.    Intervention Patient will attend CR 3 days/week and supplement with exercise at home 2 days/week.    Expected Outcomes Patient will meet both personal and program goals.           Core Components/Risk Factors/Patient Goals Review:    Core Components/Risk Factors/Patient Goals at Discharge (Final Review):    ITP Comments:   Comments: Patient arrived for 1st visit/orientation/education at 0800. Patient was referred to CR by [facility] due to NSTEMI (I21.4). During orientation advised patient on arrival and appointment times what to wear, what to do before, during and after exercise. Reviewed attendance and class policy.  Pt is scheduled to return Cardiac Rehab on 07/09/20 at 2:45. Pt was advised to come to class 15 minutes before class starts.   Discussed RPE/Dpysnea scales. Patient participated in warm up stretches. Patient was able to complete 6 minute walk test.  Telemetry:NSR. Patient was measured for the equipment. Discussed equipment safety with patient. Took patient pre-anthropometric measurements. Patient finished visit at 0915.

## 2020-07-09 ENCOUNTER — Telehealth: Payer: Self-pay

## 2020-07-09 ENCOUNTER — Other Ambulatory Visit: Payer: Self-pay

## 2020-07-09 ENCOUNTER — Encounter (HOSPITAL_COMMUNITY)
Admission: RE | Admit: 2020-07-09 | Discharge: 2020-07-09 | Disposition: A | Discharge status: Home / Self Care | Payer: Medicare Other | Source: Ambulatory Visit | Attending: Cardiology | Admitting: Cardiology

## 2020-07-09 ENCOUNTER — Other Ambulatory Visit: Payer: Self-pay | Admitting: Family Medicine

## 2020-07-09 DIAGNOSIS — I214 Non-ST elevation (NSTEMI) myocardial infarction: Secondary | ICD-10-CM

## 2020-07-09 NOTE — Telephone Encounter (Signed)
Pt said the 5 mg is not working she is needing the 10mg  pt come by the office upset and crying.

## 2020-07-09 NOTE — Progress Notes (Signed)
Daily Session Note  Patient Details  Name: Jeanne Rogers MRN: [id number] Date of Birth: 1940/10/17 Referring Provider:     CARDIAC REHAB PHASE II ORIENTATION from 07/05/2020 in [place] [facility]  Referring Provider Dr. Harl Bowie      Encounter Date: 07/09/2020  Check In:  Session Check In - 07/09/20 1445      Check-In   Supervising physician immediately available to respond to emergencies [facility] MD immediately available    Physician(s) Dr. Harl Bowie    Location [facility]    Staff Present Cathren Harsh, MS, Exercise Physiologist;Dalton Kris Mouton, MS, ACSM-CEP, Exercise Physiologist    Virtual Visit No    Medication changes reported     No    Fall or balance concerns reported    No    Tobacco Cessation No Change    Warm-up and Cool-down Performed as group-led instruction    Resistance Training Performed Yes    VAD Patient? No    PAD/SET Patient? No      Pain Assessment   Currently in Pain? No/denies    Multiple Pain Sites No           Capillary Blood Glucose: No results found for this or any previous visit (from the past 24 hour(s)).    Social History   Tobacco Use  Smoking Status Former Smoker  . Types: Cigarettes  Smokeless Tobacco Never Used    Goals Met:  Independence with exercise equipment Exercise tolerated well No report of cardiac concerns or symptoms Strength training completed today  Goals Unmet:  Not Applicable  Comments: check out 1545   Dr. Kathie Dike is Medical Director for [facility].

## 2020-07-09 NOTE — Telephone Encounter (Signed)
This will take some time for the ambien and sleep to work it's way

## 2020-07-09 NOTE — Telephone Encounter (Signed)
Pt needs refill on zolpidem (AMBIEN) 5 MG tablet sent to [facility] [place]   Pt call back [phone]

## 2020-07-09 NOTE — Telephone Encounter (Signed)
Patient states she is having trouble falling asleep and staying asleep. Patient states at first the 5mg  seemed to help and has since stopped.

## 2020-07-09 NOTE — Telephone Encounter (Signed)
Pt last seen 05/23/20 for chest pain. Please advise. Thank you

## 2020-07-10 NOTE — Progress Notes (Signed)
PATIENT: Jeanne Rogers DOB: Mar 31, 1941  REASON FOR VISIT: follow up HISTORY FROM: patient  HISTORY OF PRESENT ILLNESS: Today 07/11/20  HISTORY  Jeanne Mccombs Roseis a 79 year old female, seen in request byher primary care physician Dr. Wolfgang Phoenix for evaluation of memory loss, she is accompanied by her husband at today's clinical visit on October 11, 2019.  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  Around 2019, she was noted to have gradual onset memory loss, she forget conversation easily, difficulty remember telephone number, and people's name. This often frustrated her, she felt like she could not do what she used to do. She continue to drive without difficulty, her husband began to take over paying the household bill recently.  Her father suffered dementia in his late 76s.  Today her Mini-Mental Status Examination is 23 out of 30, she missed 3 out of 3 recalls.  I personally reviewed MRI of the brain without contrast on August 25, 2019, mild generalized atrophy, moderate small vessel disease, no acute abnormality  Laboratory evaluations showed normal CBC, TSH, CMP showed no significant abnormality  Update January 10, 2020 SS: Her daughter, Chiara Coltrin, faxed a letter prior to appointment, expressing concern about her mother's irritability and mood, she can be very rude to her husband.  Reports not much appetite, lack of energy, spends most of the day in bed, usually scrolling through Facebook, they are asking to address her  mood, hoping to make caregiving somewhat easier her father.  After last visit, was started on Namenda, tolerating well, seems to have stabilized the memory.  Continues to have repetitive questioning, is easily forgetful, has trouble with telephone #'s, dates, times.  Yesterday, thought her son was 67.  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 Still drives in her town, does well with this.  She still cooks, appetite is fair.  Manages her medications, occasionally misses pills.  Takes Ambien to sleep at night.  Is not very active during the day, has trouble telling me what she actually does during the day.  Here today for follow-up accompanied by her husband.  Update July 11, 2020 SS: Here today for follow-up accompanied by her husband, remains on Celexa and Namenda.  Celexa was added at last visit, has been quite helpful for mood, no longer aggressive, or having frequent unkind verbal remarks, is "kinder" per husband.  She manages her medications, with her husband.  Usually does well with taking.  Memory is overall stable, still trouble with the short-term memory, has some word finding difficulty.  She has a calendar, keeps track of her appointments.  She cooks, does the housework (even Medical sales representative).  Has a good appetite.  Sleeping well, but is out of her Ambien prescription.  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  Has a caregiver who comes a few days a week for a few hours, they go out and run errands.  Weight is stable.  MMSE 26/30.  REVIEW OF SYSTEMS: Out of a  complete 14 system review of symptoms, the patient complains only of the following symptoms, and all other reviewed systems are negative.  Memory loss  ALLERGIES: Allergies  Allergen Reactions  . Methergine [Methylergonovine Maleate] Other (See Comments)    Unknown    HOME MEDICATIONS: Outpatient Medications Prior to Visit   Medication Sig Dispense Refill  . alendronate (FOSAMAX) 70 MG tablet TAKE 1 TABLET BY MOUTH EVERY WEEK 12 tablet 3  . aspirin EC 81 MG EC tablet Take 1 tablet (81 mg total) by mouth daily. Swallow whole. 90 tablet 3  . cephALEXin (KEFLEX) 500 MG capsule Take 1 capsule (500 mg total) by mouth 2 (two) times daily. (Patient not taking: Reported on 07/05/2020) 14 capsule 0  . citalopram (CELEXA) 10 MG tablet Take 1 tablet (10 mg total) by mouth daily. 90 tablet 3  . ezetimibe (ZETIA) 10 MG tablet Take 1 tablet (10 mg total) by mouth daily. 90 tablet 1  . isosorbide mononitrate (IMDUR) 30 MG 24 hr tablet Take 1 tablet (30 mg total) by mouth daily. 30 tablet 3  . memantine (NAMENDA) 10 MG tablet Take 1 tablet (10 mg total) by mouth 2 (two) times daily. 60 tablet 11  . metoprolol succinate (TOPROL-XL) 25 MG 24 hr tablet Take 0.5 tablets (12.5 mg total) by mouth daily. 90 tablet 3  . zolpidem (AMBIEN) 5 MG tablet TAKE 1 TABLET(10 MG) BY MOUTH AT BEDTIME AS NEEDED FOR SLEEP (Patient taking differently: Take 5 mg by mouth at bedtime as needed for sleep. ) 30 tablet 2   No facility-administered medications prior to visit.    PAST MEDICAL HISTORY: Past Medical History:  Diagnosis Date  . Anemia   . Collapse, lung   . Complication of anesthesia    very sensitive to anesthesia  . Hyperlipidemia   . Osteoporosis   . PUD (peptic ulcer disease)   . Spontaneous pneumothorax     PAST SURGICAL HISTORY: Past Surgical History:  Procedure Laterality Date  . COLONOSCOPY N/A 01/04/2014   Procedure: COLONOSCOPY;  Surgeon: Rogene Houston, MD;  [facility];  Service: Endoscopy;  Laterality: N/A;  200  . ESOPHAGOGASTRODUODENOSCOPY N/A 01/04/2014   Procedure: ESOPHAGOGASTRODUODENOSCOPY (EGD);  Surgeon: Rogene Houston, MD;  [facility];  Service: Endoscopy;  Laterality: N/A;  . hysterectomy    . LEFT HEART CATH AND CORONARY ANGIOGRAPHY N/A 05/10/2020   Procedure: LEFT HEART CATH AND  CORONARY ANGIOGRAPHY;  Surgeon: Belva Crome, MD;  Location: [facility];  Service: Cardiovascular;  Laterality: N/A;  . MASS EXCISION Right 02/07/2016   Procedure: EXCISION MASS RIGHT INDEX FINGER AND RIGHT HAND MASS;  Surgeon: Leanora Cover, MD;  Location: [facility];  Service: Orthopedics;  Laterality: Right;  . OOPHORECTOMY Bilateral 2005    FAMILY HISTORY: Family History  Problem Relation Age of Onset  . Hyperlipidemia Mother   . Hypertension Father     SOCIAL HISTORY: Social History   Socioeconomic History  . Marital status: Married    Spouse name: Not on file  . Number of children: Not on file  . Years of education: Not on file  . Highest education level: Not on file  Occupational History  . Not on file  Tobacco Use  . Smoking status: Former Smoker    Types: Cigarettes  . Smokeless tobacco: Never Used  Substance and Sexual Activity  . Alcohol use: No  . Drug use: No  . Sexual activity: Not on file  Other Topics Concern  . Not on file  Social History Narrative  . Not on file   Social Determinants of Health   Financial Resource Strain:   . Difficulty of Paying Living Expenses: Not on file  Food Insecurity:   . Worried About Charity fundraiser in the Last Year: Not on file  . Ran Out of Food in the Last Year: Not on file  Transportation Needs:   . Lack of Transportation (Medical): Not on file  . Lack of Transportation (Non-Medical): Not on file  Physical Activity:   . Days of Exercise per Week: Not on file  . Minutes of Exercise per Session: Not on file  Stress:   . Feeling of Stress : Not on file  Social Connections:   . Frequency of Communication with Friends and Family: Not on file  . Frequency of Social Gatherings with Friends and Family: Not on file  . Attends Religious Services: Not on file  . Active Member of Clubs or Organizations: Not on file  . Attends Archivist Meetings: Not on file  . Marital Status: Not on file   Intimate Partner Violence:   . Fear of Current or Ex-Partner: Not on file  . Emotionally Abused: Not on file  . Physically Abused: Not on file  . Sexually Abused: Not on file      PHYSICAL EXAM  There were no vitals filed for this visit. There is no height or weight on file to calculate BMI.  Generalized: Well developed, in no acute distress  MMSE - Mini Mental State Exam 07/11/2020 01/10/2020 10/11/2019  Not completed: - - (No Data)  Orientation to time 4 3 2   Orientation to Place 5 5 5   Registration 3 3 3   Attention/ Calculation 5 5 4   Recall 0 0 0  Language- name 2 objects 2 2 2   Language- repeat 1 1 1   Language- follow 3 step command 3 3 3   Language- read & follow direction 1 1 1   Write a sentence 1 1 1   Copy design 1 1 1   Copy design-comments - 7 animals -  Total score 26 25 23     Neurological examination  Mentation: Alert oriented to time, place, history taking. Follows all commands speech and language fluent Cranial nerve II-XII: Pupils were equal round reactive to light. Extraocular movements were full, visual field were full on confrontational test. Facial sensation and strength were normal. Head turning and shoulder shrug  were normal and symmetric. Motor: The motor testing reveals 5 over 5 strength of all 4 extremities. Good symmetric motor tone is noted throughout.  Sensory: Sensory testing is intact to soft touch on all 4 extremities. No evidence of extinction is noted.  Coordination: Cerebellar testing reveals good finger-nose-finger and heel-to-shin bilaterally.  Gait and station: Gait is normal. Reflexes: Deep tendon reflexes are symmetric and normal bilaterally.   DIAGNOSTIC DATA (LABS, IMAGING, TESTING) - I reviewed patient records, labs, notes, testing and imaging myself where available.  Lab Results  Component Value Date   WBC 6.4 05/14/2020   HGB 13.0 05/14/2020   HCT 14.9 (L) 05/14/2020   MCV 103.5 (H) 05/14/2020   PLT 201 05/14/2020       Component Value Date/Time   NA 138 05/14/2020 1544   NA 142 11/13/2017 0812   K 4.1 05/14/2020 1544   CL 100 05/14/2020 1544   CO2 28 05/14/2020 1544   GLUCOSE 96 05/14/2020 1544   BUN 16 05/14/2020 1544  BUN 14 11/13/2017 0812   CREATININE 0.91 05/14/2020 1544   CREATININE 0.92 07/07/2013 0743   CALCIUM 10.2 05/14/2020 1544   PROT 7.0 05/09/2020 2155   PROT 6.3 12/05/2019 1131   ALBUMIN 4.1 05/09/2020 2155   ALBUMIN 4.2 12/05/2019 1131   AST 22 05/09/2020 2155   ALT 15 05/09/2020 2155   ALKPHOS 47 05/09/2020 2155   BILITOT 0.4 05/09/2020 2155   BILITOT 0.3 12/05/2019 1131   GFRNONAA >60 05/14/2020 1544   GFRAA >60 08/26/2019 0407   Lab Results  Component Value Date   CHOL 231 (H) 05/10/2020   HDL 71 05/10/2020   LDLCALC 139 (H) 05/10/2020   TRIG 106 05/10/2020   CHOLHDL 3.3 05/10/2020   Lab Results  Component Value Date   HGBA1C 5.4 05/10/2020   Lab Results  Component Value Date   VITAMINB12 514 10/11/2019   Lab Results  Component Value Date   TSH 2.521 08/25/2019   ASSESSMENT AND PLAN 79 y.o. year old female  has a past medical history of Anemia, Collapse, lung, Complication of anesthesia, Hyperlipidemia, Osteoporosis, PUD (peptic ulcer disease), and Spontaneous pneumothorax. here with:  1.  Mild cognitive impairment -MMSE today was 26/30 -Her father suffered dementia in his 61s -MRI of the brain showed mild small vessel disease, generalized atrophy -B12 is normal -Mood much improved with Celexa, continue Celexa 10 mg daily -Continue Namenda 10 mg twice a day -May consider addition of Aricept in the future -Encouraged to continue cardiac rehab, even exercises once program ends -Follow-up in 6 months or sooner if needed  I spent 20 minutes of face-to-face and non-face-to-face time with patient.  This included previsit chart review, lab review, study review, order entry, electronic health record documentation, patient education.  Butler Denmark, AGNP-C, DNP  07/11/2020, 5:33 AM [facility] [address], [place], [place] [address] [phone]

## 2020-07-11 ENCOUNTER — Encounter (HOSPITAL_COMMUNITY)
Admission: RE | Admit: 2020-07-11 | Discharge: 2020-07-11 | Disposition: A | Discharge status: Home / Self Care | Payer: Medicare Other | Source: Ambulatory Visit | Attending: Cardiology | Admitting: Cardiology

## 2020-07-11 ENCOUNTER — Other Ambulatory Visit: Payer: Self-pay

## 2020-07-11 ENCOUNTER — Telehealth: Payer: Self-pay | Admitting: *Deleted

## 2020-07-11 ENCOUNTER — Encounter: Payer: Self-pay | Admitting: Neurology

## 2020-07-11 ENCOUNTER — Ambulatory Visit (INDEPENDENT_AMBULATORY_CARE_PROVIDER_SITE_OTHER): Payer: Medicare Other | Admitting: Neurology

## 2020-07-11 VITALS — BP 171/79 | HR 72 | Ht 64.0 in | Wt 117.5 lb

## 2020-07-11 DIAGNOSIS — I214 Non-ST elevation (NSTEMI) myocardial infarction: Secondary | ICD-10-CM | POA: Diagnosis not present

## 2020-07-11 DIAGNOSIS — R0602 Shortness of breath: Secondary | ICD-10-CM

## 2020-07-11 MED ORDER — MEMANTINE HCL 10 MG PO TABS: 10.0000 mg | ORAL_TABLET | Freq: Two times a day (BID) | ORAL | 11 refills | Status: DC

## 2020-07-11 MED ORDER — DOXEPIN HCL 10 MG PO CAPS: 10.0000 mg | ORAL_CAPSULE | Freq: Every day | ORAL | 1 refills | Status: DC

## 2020-07-11 NOTE — Progress Notes (Signed)
Daily Session Note  Patient Details  Name: Jeanne Rogers MRN: [id number] Date of Birth: 08-22-1940 Referring Provider:     CARDIAC REHAB PHASE II ORIENTATION from 07/05/2020 in [facility]  Referring Provider Dr. Harl Bowie      Encounter Date: 07/11/2020  Check In:  Session Check In - 07/11/20 1445      Check-In   Supervising physician immediately available to respond to emergencies [facility] MD immediately available    Physician(s) Dr. Harl Bowie    Location [facility]    Staff Present Cathren Harsh, MS, Exercise Physiologist;Dalton Kris Mouton, MS, ACSM-CEP, Exercise Physiologist    Virtual Visit No    Medication changes reported     No    Fall or balance concerns reported    No    Tobacco Cessation No Change    Warm-up and Cool-down Performed as group-led instruction    Resistance Training Performed Yes    VAD Patient? No    PAD/SET Patient? No      Pain Assessment   Currently in Pain? No/denies    Multiple Pain Sites No           Capillary Blood Glucose: No results found for this or any previous visit (from the past 24 hour(s)).    Social History   Tobacco Use  Smoking Status Former Smoker  . Types: Cigarettes  Smokeless Tobacco Never Used    Goals Met:  Independence with exercise equipment Exercise tolerated well No report of cardiac concerns or symptoms Strength training completed today  Goals Unmet:  Not Applicable  Comments: check out 1545   Dr. Kathie Dike is Medical Director for [facility].

## 2020-07-11 NOTE — Patient Instructions (Signed)
Continue current medications Encourage you to continue your rehab program, even activity once its over See you back in 6 months

## 2020-07-11 NOTE — Telephone Encounter (Signed)
Pt walked in checking on prescription for ambien 10mg . States 5mg  is not working. Med is not on med list. But pt states she is taking 5mg .

## 2020-07-11 NOTE — Telephone Encounter (Signed)
On last visit in 9/21 discussion about memory concerns and the need for safety reasons to decrease the ambien.   Pt needing to taper off this medication.   Will give doxepin for sleep.  Have her try this for her insomnia.   Will send script to pharmacy.   Thx,   Dr. Lovena Le

## 2020-07-11 NOTE — Telephone Encounter (Signed)
Left message to return call 

## 2020-07-11 NOTE — Telephone Encounter (Signed)
See other note. D.r Jauna Raczynski

## 2020-07-13 ENCOUNTER — Other Ambulatory Visit: Payer: Self-pay

## 2020-07-13 ENCOUNTER — Encounter (HOSPITAL_COMMUNITY)
Admission: RE | Admit: 2020-07-13 | Discharge: 2020-07-13 | Disposition: A | Discharge status: Home / Self Care | Payer: Medicare Other | Source: Ambulatory Visit | Attending: Cardiology | Admitting: Cardiology

## 2020-07-13 DIAGNOSIS — I214 Non-ST elevation (NSTEMI) myocardial infarction: Secondary | ICD-10-CM | POA: Diagnosis not present

## 2020-07-13 NOTE — Telephone Encounter (Signed)
Discussed with pt. Pt verbalized understanding.  °

## 2020-07-13 NOTE — Progress Notes (Signed)
Daily Session Note  Patient Details  Name: Jeanne Rogers MRN: [id number] Date of Birth: 1941-07-06 Referring Provider:   Flowsheet Row CARDIAC REHAB PHASE II ORIENTATION from 07/05/2020 in [facility]  Referring Provider Dr. Harl Bowie      Encounter Date: 07/13/2020  Check In:  Session Check In - 07/13/20 1444      Check-In   Supervising physician immediately available to respond to emergencies [facility] MD immediately available    Physician(s) Dr. Harl Bowie    Location [facility]    Staff Present [name], RN;Madison Audria Nine, MS, Exercise Physiologist;Dalton Kris Mouton, MS, ACSM-CEP, Exercise Physiologist    Virtual Visit No    Medication changes reported     No    Fall or balance concerns reported    No    Tobacco Cessation No Change    Warm-up and Cool-down Performed as group-led instruction    Resistance Training Performed Yes    VAD Patient? No    PAD/SET Patient? No      Pain Assessment   Currently in Pain? No/denies    Pain Score 0-No pain    Multiple Pain Sites No           Capillary Blood Glucose: No results found for this or any previous visit (from the past 24 hour(s)).    Social History   Tobacco Use  Smoking Status Former Smoker  . Types: Cigarettes  Smokeless Tobacco Never Used    Goals Met:  Independence with exercise equipment Exercise tolerated well No report of cardiac concerns or symptoms Strength training completed today  Goals Unmet:  Not Applicable  Comments: check out @ 15:45   Dr. Kathie Dike is Medical Director for [facility].

## 2020-07-16 ENCOUNTER — Encounter (HOSPITAL_COMMUNITY)
Admission: RE | Admit: 2020-07-16 | Discharge: 2020-07-16 | Disposition: A | Discharge status: Home / Self Care | Payer: Medicare Other | Source: Ambulatory Visit | Attending: Cardiology | Admitting: Cardiology

## 2020-07-16 VITALS — Wt 116.4 lb

## 2020-07-16 DIAGNOSIS — I214 Non-ST elevation (NSTEMI) myocardial infarction: Secondary | ICD-10-CM | POA: Diagnosis not present

## 2020-07-16 NOTE — Progress Notes (Signed)
Daily Session Note  Patient Details  Name: Shamecka Hocutt MRN: [id number] Date of Birth: 04-Aug-1941 Referring Provider:   Flowsheet Row CARDIAC REHAB PHASE II ORIENTATION from 07/05/2020 in [facility]  Referring Provider Dr. Harl Bowie      Encounter Date: 07/16/2020  Check In:  Session Check In - 07/16/20 1445      Check-In   Supervising physician immediately available to respond to emergencies [facility] MD immediately available    Physician(s) Dr. Harl Bowie    Location [facility]    Staff Present Cathren Harsh, MS, Exercise Physiologist;Dalton Kris Mouton, MS, ACSM-CEP, Exercise Physiologist    Virtual Visit No    Medication changes reported     No    Fall or balance concerns reported    No    Tobacco Cessation No Change    Warm-up and Cool-down Performed as group-led instruction    Resistance Training Performed Yes    VAD Patient? No    PAD/SET Patient? No      Pain Assessment   Currently in Pain? No/denies    Multiple Pain Sites No           Capillary Blood Glucose: No results found for this or any previous visit (from the past 24 hour(s)).    Social History   Tobacco Use  Smoking Status Former Smoker  . Types: Cigarettes  Smokeless Tobacco Never Used    Goals Met:  Independence with exercise equipment Exercise tolerated well No report of cardiac concerns or symptoms Strength training completed today  Goals Unmet:  Not Applicable  Comments: check out 1545   Dr. Kathie Dike is Medical Director for [facility].

## 2020-07-16 NOTE — Telephone Encounter (Signed)
This has already been discussed with the patient per another phone message.

## 2020-07-18 ENCOUNTER — Other Ambulatory Visit: Payer: Self-pay

## 2020-07-18 ENCOUNTER — Encounter (HOSPITAL_COMMUNITY)
Admission: RE | Admit: 2020-07-18 | Discharge: 2020-07-18 | Disposition: A | Discharge status: Home / Self Care | Payer: Medicare Other | Source: Ambulatory Visit | Attending: Cardiology | Admitting: Cardiology

## 2020-07-18 DIAGNOSIS — I214 Non-ST elevation (NSTEMI) myocardial infarction: Secondary | ICD-10-CM | POA: Diagnosis not present

## 2020-07-18 NOTE — Progress Notes (Signed)
Cardiac Individual Treatment Plan  Patient Details  Name: Jeanne Rogers MRN: [id number] Date of Birth: 1941-02-12 Referring Provider:   Flowsheet Row CARDIAC REHAB PHASE II ORIENTATION from 07/05/2020 in [place]  Referring Provider Dr. Harl Bowie      Initial Encounter Date:  Flowsheet Row CARDIAC REHAB PHASE II ORIENTATION from 07/05/2020 in [place]  Date 07/05/20      Visit Diagnosis: NSTEMI (non-ST elevated myocardial infarction) [facility])  Patient's Home Medications on Admission:  Current Outpatient Medications:  .  alendronate (FOSAMAX) 70 MG tablet, TAKE 1 TABLET BY MOUTH EVERY WEEK, Disp: 12 tablet, Rfl: 3 .  aspirin EC 81 MG EC tablet, Take 1 tablet (81 mg total) by mouth daily. Swallow whole., Disp: 90 tablet, Rfl: 3 .  citalopram (CELEXA) 10 MG tablet, Take 1 tablet (10 mg total) by mouth daily., Disp: 90 tablet, Rfl: 3 .  doxepin (SINEQUAN) 10 MG capsule, Take 1 capsule (10 mg total) by mouth at bedtime., Disp: 30 capsule, Rfl: 1 .  ezetimibe (ZETIA) 10 MG tablet, Take 1 tablet (10 mg total) by mouth daily., Disp: 90 tablet, Rfl: 1 .  isosorbide mononitrate (IMDUR) 30 MG 24 hr tablet, Take 1 tablet (30 mg total) by mouth daily., Disp: 30 tablet, Rfl: 3 .  memantine (NAMENDA) 10 MG tablet, Take 1 tablet (10 mg total) by mouth 2 (two) times daily., Disp: 60 tablet, Rfl: 11 .  metoprolol succinate (TOPROL-XL) 25 MG 24 hr tablet, Take 0.5 tablets (12.5 mg total) by mouth daily., Disp: 90 tablet, Rfl: 3  Past Medical History: Past Medical History:  Diagnosis Date  . Anemia   . Collapse, lung   . Complication of anesthesia    very sensitive to anesthesia  . Hyperlipidemia   . Osteoporosis   . PUD (peptic ulcer disease)   . Spontaneous pneumothorax     Tobacco Use: Social History   Tobacco Use  Smoking Status Former Smoker  . Types: Cigarettes  Smokeless Tobacco Never Used    Labs: Recent Review Flowsheet Data     Labs for ITP Cardiac and Pulmonary Rehab Latest Ref Rng & Units 10/18/2014 07/21/2016 11/13/2017 08/25/2019 05/10/2020   Cholestrol 0 - 200 mg/dL 248(H) 251(H) 253(H) 240(H) 231(H)   LDLCALC 0 - 99 mg/dL 164(H) 156(H) 157(H) 150(H) 139(H)   HDL >40 mg/dL 71 77 84 74 71   Trlycerides <150 mg/dL 65 88 60 82 106   Hemoglobin A1c 4.8 - 5.6 % - - - 5.2 5.4      Capillary Blood Glucose: Lab Results  Component Value Date   GLUCAP 103 (H) 08/25/2019     Exercise Target Goals: Exercise Program Goal: Individual exercise prescription set using results from initial 6 min walk test and THRR while considering  patient's activity barriers and safety.   Exercise Prescription Goal: Starting with aerobic activity 30 plus minutes a day, 3 days per week for initial exercise prescription. Provide home exercise prescription and guidelines that participant acknowledges understanding prior to discharge.  Activity Barriers & Risk Stratification:  Activity Barriers & Cardiac Risk Stratification - 07/17/20 1208      Activity Barriers & Cardiac Risk Stratification   Activity Barriers Back Problems;Deconditioning;Muscular Weakness;Balance Concerns    Cardiac Risk Stratification High           6 Minute Walk:  6 Minute Walk    Row Name 07/05/20 1000         6 Minute Walk   Phase  Initial     Distance 1250 feet     Walk Time 6 minutes     # of Rest Breaks 0     MPH 2.4     METS 2.7     RPE 8     VO2 Peak 9.56     Symptoms No     Resting HR 67 bpm     Resting BP 130/50     Resting Oxygen Saturation  95 %     Exercise Oxygen Saturation  during 6 min walk 95 %     Max Ex. HR 86 bpm     Max Ex. BP 152/60     2 Minute Post BP 130/58            Oxygen Initial Assessment:   Oxygen Re-Evaluation:   Oxygen Discharge (Final Oxygen Re-Evaluation):   Initial Exercise Prescription:  Initial Exercise Prescription - 07/05/20 1000      Date of Initial Exercise RX and Referring Provider   Date  07/05/20    Referring Provider Dr. Harl Bowie    Expected Discharge Date 09/27/20      NuStep   Level 1    SPM 60    Minutes 22      Arm Ergometer   Level 1    RPM 60    Minutes 17      Prescription Details   Duration Progress to 30 minutes of continuous aerobic without signs/symptoms of physical distress      Intensity   THRR 40-80% of Max Heartrate 57-114    Ratings of Perceived Exertion 11-13      Progression   Progression Continue progressive overload as per policy without signs/symptoms or physical distress.      Resistance Training   Training Prescription Yes    Weight 2    Reps 10-15           Perform Capillary Blood Glucose checks as needed.  Exercise Prescription Changes:   Exercise Prescription Changes    Row Name 07/16/20 1545             Response to Exercise   Blood Pressure (Admit) 152/70       Blood Pressure (Exercise) 154/70       Blood Pressure (Exit) 122/80       Heart Rate (Admit) 75 bpm       Heart Rate (Exercise) 102 bpm       Heart Rate (Exit) 84 bpm       Rating of Perceived Exertion (Exercise) 12       Duration Continue with 30 min of aerobic exercise without signs/symptoms of physical distress.       Intensity THRR unchanged               Resistance Training   Training Prescription Yes       Weight 1 lb       Reps 10-15       Time 10 Minutes               NuStep   Level 1       SPM 60       Minutes 22       METs 1.7               Arm Ergometer   Level 1       RPM 48       Minutes 17       METs 2  Exercise Comments:   Exercise Comments    Row Name 07/09/20 1543           Exercise Comments Pt completed first exercise session. She tolerated exercise well and has no complaints.              Exercise Goals and Review:   Exercise Goals    Row Name 07/05/20 1004 07/16/20 1545           Exercise Goals   Increase Physical Activity Yes Yes      Intervention Provide advice, education, support  and counseling about physical activity/exercise needs.;Develop an individualized exercise prescription for aerobic and resistive training based on initial evaluation findings, risk stratification, comorbidities and participant's personal goals. Provide advice, education, support and counseling about physical activity/exercise needs.;Develop an individualized exercise prescription for aerobic and resistive training based on initial evaluation findings, risk stratification, comorbidities and participant's personal goals.      Expected Outcomes Short Term: Attend rehab on a regular basis to increase amount of physical activity.;Long Term: Add in home exercise to make exercise part of routine and to increase amount of physical activity.;Long Term: Exercising regularly at least 3-5 days a week. Short Term: Attend rehab on a regular basis to increase amount of physical activity.;Long Term: Add in home exercise to make exercise part of routine and to increase amount of physical activity.;Long Term: Exercising regularly at least 3-5 days a week.      Increase Strength and Stamina Yes Yes      Intervention Provide advice, education, support and counseling about physical activity/exercise needs.;Develop an individualized exercise prescription for aerobic and resistive training based on initial evaluation findings, risk stratification, comorbidities and participant's personal goals. Provide advice, education, support and counseling about physical activity/exercise needs.;Develop an individualized exercise prescription for aerobic and resistive training based on initial evaluation findings, risk stratification, comorbidities and participant's personal goals.      Expected Outcomes Short Term: Increase workloads from initial exercise prescription for resistance, speed, and METs.;Short Term: Perform resistance training exercises routinely during rehab and add in resistance training at home;Long Term: Improve cardiorespiratory  fitness, muscular endurance and strength as measured by increased METs and functional capacity (6MWT) Short Term: Increase workloads from initial exercise prescription for resistance, speed, and METs.;Short Term: Perform resistance training exercises routinely during rehab and add in resistance training at home;Long Term: Improve cardiorespiratory fitness, muscular endurance and strength as measured by increased METs and functional capacity (6MWT)      Able to understand and use rate of perceived exertion (RPE) scale Yes Yes      Intervention Provide education and explanation on how to use RPE scale Provide education and explanation on how to use RPE scale      Expected Outcomes Short Term: Able to use RPE daily in rehab to express subjective intensity level;Long Term:  Able to use RPE to guide intensity level when exercising independently Short Term: Able to use RPE daily in rehab to express subjective intensity level;Long Term:  Able to use RPE to guide intensity level when exercising independently      Knowledge and understanding of Target Heart Rate Range (THRR) Yes Yes      Intervention Provide education and explanation of THRR including how the numbers were predicted and where they are located for reference Provide education and explanation of THRR including how the numbers were predicted and where they are located for reference      Expected Outcomes Short Term: Able to state/look up  THRR;Long Term: Able to use THRR to govern intensity when exercising independently;Short Term: Able to use daily as guideline for intensity in rehab Short Term: Able to state/look up THRR;Long Term: Able to use THRR to govern intensity when exercising independently;Short Term: Able to use daily as guideline for intensity in rehab      Able to check pulse independently Yes Yes      Intervention Provide education and demonstration on how to check pulse in carotid and radial arteries.;Review the importance of being able to  check your own pulse for safety during independent exercise Provide education and demonstration on how to check pulse in carotid and radial arteries.;Review the importance of being able to check your own pulse for safety during independent exercise      Expected Outcomes Short Term: Able to explain why pulse checking is important during independent exercise;Long Term: Able to check pulse independently and accurately Short Term: Able to explain why pulse checking is important during independent exercise;Long Term: Able to check pulse independently and accurately      Understanding of Exercise Prescription Yes Yes      Intervention Provide education, explanation, and written materials on patient's individual exercise prescription Provide education, explanation, and written materials on patient's individual exercise prescription      Expected Outcomes Short Term: Able to explain program exercise prescription;Long Term: Able to explain home exercise prescription to exercise independently Short Term: Able to explain program exercise prescription;Long Term: Able to explain home exercise prescription to exercise independently             Exercise Goals Re-Evaluation :  Exercise Goals Re-Evaluation    [place] Name 07/16/20 1545             Exercise Goal Re-Evaluation   Exercise Goals Review Increase Physical Activity;Increase Strength and Stamina;Able to understand and use rate of perceived exertion (RPE) scale;Knowledge and understanding of Target Heart Rate Range (THRR);Able to check pulse independently;Understanding of Exercise Prescription       Comments Patient has completed 4 exercise session. She has tolerated exercise well and has been progressing well. She is currently exercising at 1.7 METs on the NuStep. Will continue to monitor and progress exercise as able.       Expected Outcomes Through exercise at rehab and a home exercise program, patient will reach their goals.                Discharge Exercise Prescription (Final Exercise Prescription Changes):  Exercise Prescription Changes - 07/16/20 1545      Response to Exercise   Blood Pressure (Admit) 152/70    Blood Pressure (Exercise) 154/70    Blood Pressure (Exit) 122/80    Heart Rate (Admit) 75 bpm    Heart Rate (Exercise) 102 bpm    Heart Rate (Exit) 84 bpm    Rating of Perceived Exertion (Exercise) 12    Duration Continue with 30 min of aerobic exercise without signs/symptoms of physical distress.    Intensity THRR unchanged      Resistance Training   Training Prescription Yes    Weight 1 lb    Reps 10-15    Time 10 Minutes      NuStep   Level 1    SPM 60    Minutes 22    METs 1.7      Arm Ergometer   Level 1    RPM 48    Minutes 17    METs 2  Nutrition:  Target Goals: Understanding of nutrition guidelines, daily intake of sodium 1500mg , cholesterol 200mg , calories 30% from fat and 7% or less from saturated fats, daily to have 5 or more servings of fruits and vegetables.  Biometrics:  Pre Biometrics - 07/16/20 1545      Pre Biometrics   Weight 52.8 kg    BMI (Calculated) 19.97            Nutrition Therapy Plan and Nutrition Goals:  Nutrition Therapy & Goals - 07/13/20 1437      Personal Nutrition Goals   Comments We will continue to provide heart healthy nutritional education through hand-outs.      Intervention Plan   Intervention Nutrition handout(s) given to patient.           Nutrition Assessments:  Nutrition Assessments - 07/05/20 0958      MEDFICTS Scores   Pre Score 22          MEDIFICTS Score Key:  ?70 Need to make dietary changes   40-70 Heart Healthy Diet  ? 40 Therapeutic Level Cholesterol Diet   Picture Your Plate Scores:  <26 Unhealthy dietary pattern with much room for improvement.  41-50 Dietary pattern unlikely to meet recommendations for good health and room for improvement.  51-60 More healthful dietary pattern, with  some room for improvement.   >60 Healthy dietary pattern, although there may be some specific behaviors that could be improved.    Nutrition Goals Re-Evaluation:   Nutrition Goals Discharge (Final Nutrition Goals Re-Evaluation):   Psychosocial: Target Goals: Acknowledge presence or absence of significant depression and/or stress, maximize coping skills, provide positive support system. Participant is able to verbalize types and ability to use techniques and skills needed for reducing stress and depression.  Initial Review & Psychosocial Screening:  Initial Psych Review & Screening - 07/05/20 0943      Initial Review   Current issues with None Identified      Family Dynamics   Good Support System? Yes    Comments Patient lives with her husband of 30 years. She reports he and her children are her support system along with some friends. She is very active in her community and has a positive outlook. She denies any depression or anxiety. She was evaluated by a neurologist in June 2021 for mild memory loss and irritability. She was diagnosed with mild cognitive impairment and was started on Citalopram 10 mg daily to help wth her mood and irritability. She also takes Namenda 10 mg BID. Will continue to monitor.      Barriers   Psychosocial barriers to participate in program There are no identifiable barriers or psychosocial needs.      Screening Interventions   Interventions Encouraged to exercise    Expected Outcomes Short Term goal: Identification and review with participant of any Quality of Life or Depression concerns found by scoring the questionnaire.;Long Term goal: The participant improves quality of Life and PHQ9 Scores as seen by post scores and/or verbalization of changes           Quality of Life Scores:  Quality of Life - 07/05/20 0959      Quality of Life   Select Quality of Life      Quality of Life Scores   Health/Function Pre 26.62 %    Socioeconomic Pre 28.75 %     Psych/Spiritual Pre 26.5 %    Family Pre 28.8 %    GLOBAL Pre 27.38 %  Scores of 19 and below usually indicate a poorer quality of life in these areas.  A difference of  2-3 points is a clinically meaningful difference.  A difference of 2-3 points in the total score of the Quality of Life Index has been associated with significant improvement in overall quality of life, self-image, physical symptoms, and general health in studies assessing change in quality of life.  PHQ-9: Recent Review Flowsheet Data    Depression screen [facility] 2/9 07/05/2020 04/16/2020 11/09/2017 07/23/2016   Decreased Interest 0 0 0 0   Down, Depressed, Hopeless 0 1 0 0   PHQ - 2 Score 0 1 0 0   Altered sleeping 1 1 - -   Tired, decreased energy 1 0 - -   Change in appetite 0 0 - -   Feeling bad or failure about yourself  0 0 - -   Trouble concentrating 0 0 - -   Moving slowly or fidgety/restless 0 0 - -   Suicidal thoughts 0 0 - -   PHQ-9 Score 2 2 - -   Difficult doing work/chores Not difficult at all Not difficult at all - -     Interpretation of Total Score  Total Score Depression Severity:  1-4 = Minimal depression, 5-9 = Mild depression, 10-14 = Moderate depression, 15-19 = Moderately severe depression, 20-27 = Severe depression   Psychosocial Evaluation and Intervention:  Psychosocial Evaluation - 07/05/20 0955      Psychosocial Evaluation & Interventions   Interventions Stress management education;Relaxation education;Encouraged to exercise with the program and follow exercise prescription    Comments Patient has no psychosocial barriers identified at her orientation visit. Her initial QOL socre was 27.38 overal. and her PHQ-9 score was 2. Will continue to monitor.    Expected Outcomes Patient will have no psychosocial issues identified at discharge.    Continue Psychosocial Services  No Follow up required           Psychosocial Re-Evaluation:  Psychosocial Re-Evaluation    [place] Name  07/13/20 1438             Psychosocial Re-Evaluation   Current issues with None Identified       Comments Patient continue to have no psychosocial issues identified. She demonstrates a positive outlook regarding her future and seems to enjoy coming to the program. Will continue to monitor.       Expected Outcomes Patient will have no psychosocial issues identified at discharge.       Interventions Stress management education;Encouraged to attend Cardiac Rehabilitation for the exercise;Relaxation education       Continue Psychosocial Services  No Follow up required              Psychosocial Discharge (Final Psychosocial Re-Evaluation):  Psychosocial Re-Evaluation - 07/13/20 1438      Psychosocial Re-Evaluation   Current issues with None Identified    Comments Patient continue to have no psychosocial issues identified. She demonstrates a positive outlook regarding her future and seems to enjoy coming to the program. Will continue to monitor.    Expected Outcomes Patient will have no psychosocial issues identified at discharge.    Interventions Stress management education;Encouraged to attend Cardiac Rehabilitation for the exercise;Relaxation education    Continue Psychosocial Services  No Follow up required           Vocational Rehabilitation: Provide vocational rehab assistance to qualifying candidates.   Vocational Rehab Evaluation & Intervention:  Vocational Rehab - 07/05/20 3276  Initial Vocational Rehab Evaluation & Intervention   Assessment shows need for Vocational Rehabilitation No      Vocational Rehab Re-Evaulation   Comments Patient is retired and is not interest in returning to work. She does not need vocational rehab.           Education: Education Goals: Education classes will be provided on a weekly basis, covering required topics. Participant will state understanding/return demonstration of topics presented.  Learning Barriers/Preferences:   Learning Barriers/Preferences - 07/05/20 4128      Learning Barriers/Preferences   Learning Barriers None    Learning Preferences Video;Audio;Written Material           Education Topics: Hypertension, Hypertension Reduction -Define heart disease and high blood pressure. Discus how high blood pressure affects the body and ways to reduce high blood pressure.   Exercise and Your Heart -Discuss why it is important to exercise, the FITT principles of exercise, normal and abnormal responses to exercise, and how to exercise safely.   Angina -Discuss definition of angina, causes of angina, treatment of angina, and how to decrease risk of having angina.   Cardiac Medications -Review what the following cardiac medications are used for, how they affect the body, and side effects that may occur when taking the medications.  Medications include Aspirin, Beta blockers, calcium channel blockers, ACE Inhibitors, angiotensin receptor blockers, diuretics, digoxin, and antihyperlipidemics.   Congestive Heart Failure -Discuss the definition of CHF, how to live with CHF, the signs and symptoms of CHF, and how keep track of weight and sodium intake.   Heart Disease and Intimacy -Discus the effect sexual activity has on the heart, how changes occur during intimacy as we age, and safety during sexual activity.   Smoking Cessation / COPD -Discuss different methods to quit smoking, the health benefits of quitting smoking, and the definition of COPD.   Nutrition I: Fats -Discuss the types of cholesterol, what cholesterol does to the heart, and how cholesterol levels can be controlled.   Nutrition II: Labels -Discuss the different components of food labels and how to read food label   Heart Parts/Heart Disease and PAD -Discuss the anatomy of the heart, the pathway of blood circulation through the heart, and these are affected by heart disease.   Stress I: Signs and Symptoms -Discuss the  causes of stress, how stress may lead to anxiety and depression, and ways to limit stress.   Stress II: Relaxation -Discuss different types of relaxation techniques to limit stress.   Warning Signs of Stroke / TIA -Discuss definition of a stroke, what the signs and symptoms are of a stroke, and how to identify when someone is having stroke.   Knowledge Questionnaire Score:  Knowledge Questionnaire Score - 07/05/20 0958      Knowledge Questionnaire Score   Pre Score 16/24           Core Components/Risk Factors/Patient Goals at Admission:  Personal Goals and Risk Factors at Admission - 07/05/20 0959      Core Components/Risk Factors/Patient Goals on Admission    Weight Management Weight Loss    Personal Goal Other Yes    Personal Goal Get stronger. Get healthier. Be able to do her ADL's.    Intervention Patient will attend CR 3 days/week and supplement with exercise at home 2 days/week.    Expected Outcomes Patient will meet both personal and program goals.           Core Components/Risk Factors/Patient Goals Review:   Goals  and Risk Factor Review    Row Name 07/13/20 1439             Core Components/Risk Factors/Patient Goals Review   Personal Goals Review Other       Review Patient is new to the program completing 3 sessions maintaining her weight since her initial visit. She has multiple risk factors for CAD and is here for risk modification. Her personal goals are to get stronger; be albe to do her ADL's and gain confidence. Will continue to monitor for progress.       Expected Outcomes Patient will complete the program meeting both personal and program goals.              Core Components/Risk Factors/Patient Goals at Discharge (Final Review):   Goals and Risk Factor Review - 07/13/20 1439      Core Components/Risk Factors/Patient Goals Review   Personal Goals Review Other    Review Patient is new to the program completing 3 sessions maintaining her weight  since her initial visit. She has multiple risk factors for CAD and is here for risk modification. Her personal goals are to get stronger; be albe to do her ADL's and gain confidence. Will continue to monitor for progress.    Expected Outcomes Patient will complete the program meeting both personal and program goals.           ITP Comments:   Comments: ITP REVIEW Pt is making expected progress toward Cardiac Rehab goals after completing 4 sessions. Recommend continued exercise, life style modification, education, and increased stamina and strength.

## 2020-07-18 NOTE — Progress Notes (Signed)
Daily Session Note  Patient Details  Name: Jeanne Rogers MRN: [id number] Date of Birth: [birth year] Referring Provider:   Flowsheet Row CARDIAC REHAB PHASE II ORIENTATION from 07/05/2020 in [facility]  Referring Provider Dr. Harl Bowie      Encounter Date: 07/18/2020  Check In:  Session Check In - 07/18/20 1445      Check-In   Supervising physician immediately available to respond to emergencies [facility] MD immediately available    Physician(s) Dr. Harl Bowie    Location [facility]    Staff Present Cathren Harsh, MS, Exercise Physiologist;Dalton Kris Mouton, MS, ACSM-CEP, Exercise Physiologist    Virtual Visit No    Medication changes reported     No    Fall or balance concerns reported    No    Tobacco Cessation No Change    Warm-up and Cool-down Performed as group-led instruction    Resistance Training Performed Yes    VAD Patient? No    PAD/SET Patient? No      Pain Assessment   Currently in Pain? Yes    Pain Score 6     Pain Location Back    Pain Orientation Lower    Pain Descriptors / Indicators Shooting;Sharp    Multiple Pain Sites No           Capillary Blood Glucose: No results found for this or any previous visit (from the past 24 hour(s)).    Social History   Tobacco Use  Smoking Status Former Smoker  . Types: Cigarettes  Smokeless Tobacco Never Used    Goals Met:  Independence with exercise equipment Exercise tolerated well No report of cardiac concerns or symptoms Strength training completed today  Goals Unmet:  Not Applicable  Comments: check out 1545   Dr. Kathie Dike is Medical Director for [facility].

## 2020-07-20 ENCOUNTER — Telehealth: Payer: Self-pay | Admitting: *Deleted

## 2020-07-20 ENCOUNTER — Encounter (HOSPITAL_COMMUNITY): Payer: Medicare Other

## 2020-07-20 ENCOUNTER — Ambulatory Visit
Admission: EM | Admit: 2020-07-20 | Discharge: 2020-07-20 | Disposition: A | Discharge status: Home / Self Care | Payer: Medicare Other | Attending: Emergency Medicine | Admitting: Emergency Medicine

## 2020-07-20 ENCOUNTER — Ambulatory Visit (INDEPENDENT_AMBULATORY_CARE_PROVIDER_SITE_OTHER): Payer: Medicare Other

## 2020-07-20 ENCOUNTER — Other Ambulatory Visit: Payer: Self-pay

## 2020-07-20 ENCOUNTER — Other Ambulatory Visit: Payer: Self-pay | Admitting: *Deleted

## 2020-07-20 DIAGNOSIS — M4854XA Collapsed vertebra, not elsewhere classified, thoracic region, initial encounter for fracture: Secondary | ICD-10-CM

## 2020-07-20 DIAGNOSIS — M549 Dorsalgia, unspecified: Secondary | ICD-10-CM

## 2020-07-20 DIAGNOSIS — M8588 Other specified disorders of bone density and structure, other site: Secondary | ICD-10-CM

## 2020-07-20 DIAGNOSIS — M546 Pain in thoracic spine: Secondary | ICD-10-CM | POA: Diagnosis not present

## 2020-07-20 DIAGNOSIS — S22000A Wedge compression fracture of unspecified thoracic vertebra, initial encounter for closed fracture: Secondary | ICD-10-CM | POA: Diagnosis not present

## 2020-07-20 MED ORDER — PREDNISONE 10 MG PO TABS: 20.0000 mg | ORAL_TABLET | Freq: Every day | ORAL | 0 refills | Status: AC

## 2020-07-20 MED ORDER — TRAMADOL HCL 50 MG PO TABS: 50.0000 mg | ORAL_TABLET | Freq: Four times a day (QID) | ORAL | 0 refills | Status: DC | PRN

## 2020-07-20 NOTE — Telephone Encounter (Signed)
Nurses note Please talk with family certainly we are sympathetic to what is going on I was able to look at the images The urgent care center prescribed low-dose prednisone as well as tramadol.  Prednisone typically would only be for a few days  Using Ultram 1 every 4-6 hours as needed try to keep at 4 or less per day would be advisable.   1.  I recommend an appointment with Lollie Marrow on Monday #2 initiate referral urgent referral for interventional radiology for kyphoplasty #3 but I highly recommend office visit on Monday to discuss benefits and negatives about kyphoplasty with Dr. Lovena Le #4(calcitonin can also be used but I will leave that up to Dr. Lovena Le)  (Nurses obviously talk with family see how things are going in unfortunately this is a very painful problem stronger narcotics can be used but those narcotics can sometimes cause side effects including nausea sedation, and are not a long-term solution)

## 2020-07-20 NOTE — Telephone Encounter (Signed)
Patient husband notified of Dr. Bary Leriche recommendations and verbalized understanding. Patient scheduled with Dr. Lovena Le Monday.

## 2020-07-20 NOTE — Telephone Encounter (Signed)
FYI

## 2020-07-20 NOTE — ED Provider Notes (Addendum)
[place]   [id number] 07/20/20 Arrival Time: [place]   Chief Complaint  Patient presents with  . Back Pain     SUBJECTIVE: History from: patient.  Jeanne Rogers is a 79 y.o. female with history of compression fraction presented to the urgent care with a complaint of mid back pain for the past few days. Denies any precipitating event. She localizes the pain to the mid back. She describes the pain as constant and achy. She has tried OTC medications without relief. Her symptoms are made worse with ROM.  She report similar symptoms in the past. Denies chills, fever, nausea, vomiting, diarrhea, paresthesia, blurry vision, diplopia, facial droop.   ROS: As per HPI.  All other pertinent ROS negative.       Past Medical History:  Diagnosis Date  . Anemia   . Collapse, lung   . Complication of anesthesia    very sensitive to anesthesia  . Hyperlipidemia   . Osteoporosis   . PUD (peptic ulcer disease)   . Spontaneous pneumothorax    Past Surgical History:  Procedure Laterality Date  . COLONOSCOPY N/A 01/04/2014   Procedure: COLONOSCOPY;  Surgeon: Rogene Houston, MD;  Location: [facility];  Service: Endoscopy;  Laterality: N/A;  200  . ESOPHAGOGASTRODUODENOSCOPY N/A 01/04/2014   Procedure: ESOPHAGOGASTRODUODENOSCOPY (EGD);  Surgeon: Rogene Houston, MD;  Location: [facility];  Service: Endoscopy;  Laterality: N/A;  . hysterectomy    . LEFT HEART CATH AND CORONARY ANGIOGRAPHY N/A 05/10/2020   Procedure: LEFT HEART CATH AND CORONARY ANGIOGRAPHY;  Surgeon: Belva Crome, MD;  Location: [facility];  Service: Cardiovascular;  Laterality: N/A;  . MASS EXCISION Right 02/07/2016   Procedure: EXCISION MASS RIGHT INDEX FINGER AND RIGHT HAND MASS;  Surgeon: Leanora Cover, MD;  Location: [facility];  Service: Orthopedics;  Laterality: Right;  . OOPHORECTOMY Bilateral 2005   Allergies  Allergen Reactions  . Methergine [Methylergonovine Maleate] Other (See  Comments)    Unknown   No current facility-administered medications on file prior to encounter.   Current Outpatient Medications on File Prior to Encounter  Medication Sig Dispense Refill  . alendronate (FOSAMAX) 70 MG tablet TAKE 1 TABLET BY MOUTH EVERY WEEK 12 tablet 3  . aspirin EC 81 MG EC tablet Take 1 tablet (81 mg total) by mouth daily. Swallow whole. 90 tablet 3  . citalopram (CELEXA) 10 MG tablet Take 1 tablet (10 mg total) by mouth daily. 90 tablet 3  . doxepin (SINEQUAN) 10 MG capsule Take 1 capsule (10 mg total) by mouth at bedtime. 30 capsule 1  . ezetimibe (ZETIA) 10 MG tablet Take 1 tablet (10 mg total) by mouth daily. 90 tablet 1  . isosorbide mononitrate (IMDUR) 30 MG 24 hr tablet Take 1 tablet (30 mg total) by mouth daily. 30 tablet 3  . memantine (NAMENDA) 10 MG tablet Take 1 tablet (10 mg total) by mouth 2 (two) times daily. 60 tablet 11  . metoprolol succinate (TOPROL-XL) 25 MG 24 hr tablet Take 0.5 tablets (12.5 mg total) by mouth daily. 90 tablet 3   Social History   Socioeconomic History  . Marital status: Married    Spouse name: Not on file  . Number of children: Not on file  . Years of education: Not on file  . Highest education level: Not on file  Occupational History  . Not on file  Tobacco Use  . Smoking status: Former Smoker  Types: Cigarettes  . Smokeless tobacco: Never Used  Substance and Sexual Activity  . Alcohol use: No  . Drug use: No  . Sexual activity: Not on file  Other Topics Concern  . Not on file  Social History Narrative  . Not on file   Social Determinants of Health   Financial Resource Strain: Not on file  Food Insecurity: Not on file  Transportation Needs: Not on file  Physical Activity: Not on file  Stress: Not on file  Social Connections: Not on file  Intimate Partner Violence: Not on file   Family History  Problem Relation Age of Onset  . Hyperlipidemia Mother   . Hypertension Father     OBJECTIVE:  Vitals:    07/20/20 0946 07/20/20 0949  BP: (!) 149/81   Resp: 18   Temp:  98 F (36.7 C)  TempSrc:  Oral  SpO2: 95%      Physical Exam Vitals and nursing note reviewed.  Constitutional:      General: She is not in acute distress.    Appearance: Normal appearance. She is normal weight. She is not ill-appearing, toxic-appearing or diaphoretic.  HENT:     Head: Normocephalic.  Cardiovascular:     Rate and Rhythm: Normal rate and regular rhythm.     Pulses: Normal pulses.     Heart sounds: Normal heart sounds. No murmur heard. No friction rub. No gallop.   Pulmonary:     Effort: Pulmonary effort is normal. No respiratory distress.     Breath sounds: Normal breath sounds. No stridor. No wheezing, rhonchi or rales.  Chest:     Chest wall: No tenderness.  Musculoskeletal:        General: Tenderness present.     Thoracic back: Tenderness present. No swelling or spasms.     Comments: back:  Patient ambulates from chair to exam table without difficulty.  Inspection: Skin clear and intact without obvious swelling, erythema, or ecchymosis. Warm to the touch  Palpation: Vertebral processes nontender. Tenderness about the the mid back   Neurological:     Mental Status: She is alert and oriented to person, place, and time.     LABS:  No results found for this or any previous visit (from the past 24 hour(s)).   RADIOLOGY:  No results found.   X-ray is positive for moderate to severe T8 compression fracture. There is also a chronic T12 and mild L1 compression fracture with osteopenia.  I have reviewed the x-ray myself and the radiologist interpretation.  I am in agreement with the radiologist interpretation.  ASSESSMENT & PLAN:  1. Mid back pain   2. Compression fracture of body of thoracic vertebra (HCC)   3. Osteopenia of lumbar spine     Meds ordered this encounter  Medications  . predniSONE (DELTASONE) 10 MG tablet    Sig: Take 2 tablets (20 mg total) by mouth daily for 5 days.     Dispense:  10 tablet    Refill:  0  . traMADol (ULTRAM) 50 MG tablet    Sig: Take 1 tablet (50 mg total) by mouth every 6 (six) hours as needed for severe pain.    Dispense:  14 tablet    Refill:  0   Patient is stable at discharge. She will benefit for a referral to a spine neurologist. Tramadol was prescribed for severe pain, prednisone was prescribed. Follow-up with PCP for referral  Discharge Instructions  Rest, ice and heat as needed Ensure adequate  ROM as tolerated. Prescribed prednisone/take as directed  Prescribed tramadol for severe pain Continue to take Tylenol 500 mg as needed for pain Follow-up with PCP for referral to neurologist specialize in spine Return here or go to ER if you have any new or worsening symptoms such as numbness/tingling of the inner thighs, loss of bladder or bowel control, headache/blurry vision, nausea/vomiting, confusion/altered mental status, dizziness, weakness, passing out, imbalance, etc...    Reviewed expectations re: course of current medical issues. Questions answered. Outlined signs and symptoms indicating need for more acute intervention. Patient verbalized understanding. After Visit Summary given.    PDMP reviewed during this encounter.      Emerson Monte, FNP 07/20/20 1103    Emerson Monte, FNP 07/20/20 1105

## 2020-07-20 NOTE — ED Triage Notes (Signed)
Pt presents with mid back pain and is concerned for compression fracture , has h/o same

## 2020-07-20 NOTE — Telephone Encounter (Signed)
Patient was sent to urgent care for back pain this morning as we had no appointments. Daughter called stating patient was seen and did have a new fracture. They are hoping something can be done quickly to give patient some relief. Please advise next steps or referrals?  1. Moderate to severe T8 compression fracture is new since October. 2. Osteopenia and chronic T12 and mild L1 compression fractures. No other acute osseous abnormality identified.

## 2020-07-20 NOTE — Discharge Instructions (Addendum)
Rest, ice and heat as needed Ensure adequate ROM as tolerated. Prescribed prednisone/take as directed  Prescribed tramadol for severe pain Continue to take Tylenol 500 mg as needed for pain Follow-up with PCP for referral to neurologist specialize in spine Return here or go to ER if you have any new or worsening symptoms such as numbness/tingling of the inner thighs, loss of bladder or bowel control, headache/blurry vision, nausea/vomiting, confusion/altered mental status, dizziness, weakness, passing out, imbalance, etc..Marland Kitchen

## 2020-07-23 ENCOUNTER — Other Ambulatory Visit: Payer: Self-pay

## 2020-07-23 ENCOUNTER — Ambulatory Visit (INDEPENDENT_AMBULATORY_CARE_PROVIDER_SITE_OTHER): Payer: Medicare Other | Admitting: Family Medicine

## 2020-07-23 ENCOUNTER — Encounter: Payer: Self-pay | Admitting: Family Medicine

## 2020-07-23 ENCOUNTER — Encounter (HOSPITAL_COMMUNITY): Payer: Medicare Other

## 2020-07-23 VITALS — BP 138/96 | HR 78 | Temp 98.2°F | Ht 64.0 in

## 2020-07-23 DIAGNOSIS — M8080XA Other osteoporosis with current pathological fracture, unspecified site, initial encounter for fracture: Secondary | ICD-10-CM | POA: Diagnosis not present

## 2020-07-23 DIAGNOSIS — G4700 Insomnia, unspecified: Secondary | ICD-10-CM | POA: Diagnosis not present

## 2020-07-23 DIAGNOSIS — S22000A Wedge compression fracture of unspecified thoracic vertebra, initial encounter for closed fracture: Secondary | ICD-10-CM

## 2020-07-23 MED ORDER — ZOLPIDEM TARTRATE 5 MG PO TABS: 5.0000 mg | ORAL_TABLET | Freq: Every evening | ORAL | 1 refills | Status: DC | PRN

## 2020-07-23 MED ORDER — ALENDRONATE SODIUM 70 MG PO TABS: 70.0000 mg | ORAL_TABLET | ORAL | 3 refills | Status: DC

## 2020-07-23 NOTE — Progress Notes (Signed)
Patient ID: Jeanne Rogers, female    DOB: 25-Jun-1941, 79 y.o.   MRN: [id number]   Chief Complaint  Patient presents with  . Back Pain    New thoracic vertebral fracture   Subjective:    HPI   Cc-back pain for 4 days. Dx with compression fx T8 on 07/20/20. Pt seen with husband today.  Pt complaining that she "can't sleep."  Tried doxepin and was up till 3am and couldn't get to sleep. Pt used to be on ambien 10mg  in the fall. We tapered pt down to 5mg  ambien, then tapered off and started on doxepin. Pt stating up all night, couldn't sleep.  Pt went to urgent care 3 day ago.  Was in pain for a few days prior.  No h/o back surgeries. Pt has h/o osteoporosis and taking fosamax for years. Last dexa scan.   Pt is taking a lot of medication. Pt given prednisone and tramadol for pain.  She's not taking the tramadol.    Xray thoracic spine- 07/20/20  IMPRESSION: 1. Moderate to severe T8 compression fracture is new since October. 2. Osteopenia and chronic T12 and mild L1 compression fractures. No other acute osseous abnormality identified.  Medical History Viviane Semidey has a past medical history of Anemia, Collapse, lung, Complication of anesthesia, Hyperlipidemia, Osteoporosis, PUD (peptic ulcer disease), and Spontaneous pneumothorax.   Outpatient Encounter Medications as of 07/23/2020  Medication Sig  . aspirin EC 81 MG EC tablet Take 1 tablet (81 mg total) by mouth daily. Swallow whole.  . citalopram (CELEXA) 10 MG tablet Take 1 tablet (10 mg total) by mouth daily.  Marland Kitchen ezetimibe (ZETIA) 10 MG tablet Take 1 tablet (10 mg total) by mouth daily.  . isosorbide mononitrate (IMDUR) 30 MG 24 hr tablet Take 1 tablet (30 mg total) by mouth daily.  . memantine (NAMENDA) 10 MG tablet Take 1 tablet (10 mg total) by mouth 2 (two) times daily.  . metoprolol succinate (TOPROL-XL) 25 MG 24 hr tablet Take 0.5 tablets (12.5 mg total) by mouth daily.  . predniSONE (DELTASONE) 10 MG tablet Take 2  tablets (20 mg total) by mouth daily for 5 days.  . traMADol (ULTRAM) 50 MG tablet Take 1 tablet (50 mg total) by mouth every 6 (six) hours as needed for severe pain.  . [DISCONTINUED] alendronate (FOSAMAX) 70 MG tablet TAKE 1 TABLET BY MOUTH EVERY WEEK  . [DISCONTINUED] doxepin (SINEQUAN) 10 MG capsule Take 1 capsule (10 mg total) by mouth at bedtime.  Marland Kitchen alendronate (FOSAMAX) 70 MG tablet Take 1 tablet (70 mg total) by mouth once a week. Take with a full glass of water on an empty stomach.  . zolpidem (AMBIEN) 5 MG tablet Take 1 tablet (5 mg total) by mouth at bedtime as needed for sleep.   No facility-administered encounter medications on file as of 07/23/2020.     Review of Systems  Constitutional: Negative for chills and fever.  Musculoskeletal: Positive for back pain (new compression fx  T8).  Skin: Negative for rash.  Neurological: Negative for dizziness, syncope and weakness.  Psychiatric/Behavioral: Positive for sleep disturbance. Negative for dysphoric mood. The patient is not nervous/anxious.      Vitals BP (!) 138/96   Pulse 78   Temp 98.2 F (36.8 C)   Ht 5\' 4"  (1.626 m)   SpO2 98%   BMI 19.98 kg/m   Objective:   Physical Exam Vitals and nursing note reviewed.  Constitutional:      General:  She is in acute distress (mild due to pain).     Appearance: Normal appearance. She is not ill-appearing.  Cardiovascular:     Rate and Rhythm: Normal rate.  Pulmonary:     Effort: Pulmonary effort is normal. No respiratory distress.  Musculoskeletal:        General: Tenderness present. No swelling, deformity or signs of injury.     Cervical back: Normal range of motion.     Right lower leg: No edema.     Left lower leg: No edema.     Comments: +ttp over throracic T7-T10, midline. Dec rom with flexion or extension.  Skin:    General: Skin is warm and dry.     Findings: No bruising, erythema or rash.  Neurological:     General: No focal deficit present.     Mental  Status: She is alert and oriented to person, place, and time.     Cranial Nerves: No cranial nerve deficit.  Psychiatric:        Mood and Affect: Mood normal.        Behavior: Behavior normal.      Assessment and Plan   1. Compression fracture of body of thoracic vertebra-T8 [facility]) - Ambulatory referral to Orthopedic Surgery  2. Other osteoporosis with current pathological fracture, initial encounter - Ambulatory referral to Orthopedic Surgery  3. Insomnia, unspecified type    Pt stating 81mg  asa, due to heart attack in fall 2021.  New T8 thoracic fx- h/o other vertebral fracture in past on imaging. Urgent referral to ortho spine. Pt needing to get kyphoplasty. Given tramadol from urgent care and pt not taking it.  Gave advice on taking pain medications or tylenol to help with pain, heat/ice prn.  Pt had trial of doxepin after tapering off the ambien.  Pt stating she is not able to sleep. We discussed the concerns of being on large dose of ambien 10mg .  Would give her 5mg  ambien in meantime, until finished dealing with her thoracic fracture and then we will discuss further medications for insomnia.  Advised to pt and husband not to take tramadol and ambien together.    F/u 2 mo or prn.

## 2020-07-23 NOTE — Patient Instructions (Addendum)
For pain mild- 500mg  - 1000mg  tylenol.  If pain moderate to severe- take 1-2 tablets of tramadol every 8 hrs.  Can have tylenol with the tramadol.  Use heat/ or ice to the back.    Balloon Kyphoplasty Balloon kyphoplasty is a procedure to treat a spinal compression fracture, which is a collapse of the bones that form the spine (vertebrae). With this type of fracture, the vertebrae are squeezed (compressed) into a wedge shape, and this causes pain. In this procedure, the collapsed vertebrae are expanded with a balloon, and bone cement is injected into the vertebrae to strengthen them. Tell a health care provider about:  Any allergies you have.  All medicines you are taking, including vitamins, herbs, eye drops, creams, and over-the-counter medicines.  Any problems you or family members have had with anesthetic medicines.  Any blood disorders you have.  Any surgeries you have had.  Any medical conditions you have or have had.  Whether you are pregnant or may be pregnant. What are the risks? Generally, this is a safe procedure. However, problems may occur, including:  Infection.  Bleeding.  Increased back pain.  Allergic reactions to medicines.  Damage to other structures, nerves, or organs.  Leaking of bone cement into other parts of the body. What happens before the procedure? Medicines Ask your health care provider about:  Changing or stopping your regular medicines. This is especially important if you are taking diabetes medicines or blood thinners.  Taking medicines such as aspirin and ibuprofen. These medicines can thin your blood. Do not take these medicines unless your health care provider tells you to take them.  Taking over-the-counter medicines, vitamins, herbs, and supplements. General instructions  Follow instructions from your health care provider about eating or drinking restrictions.  Do not use any products that contain nicotine or tobacco for at  least 4 weeks before the procedure. These products include cigarettes, e-cigarettes, and chewing tobacco. If you need help quitting, ask your health care provider.  Ask your health care provider: ? How your surgery site will be marked. ? What steps will be taken to help prevent infection. These may include:  Removing hair at the surgery site.  Washing skin with a germ-killing soap.  Taking antibiotic medicine.  Plan to have someone take you home from the hospital or clinic.  If you will be going home right after the procedure, plan to have someone with you for 24 hours. What happens during the procedure?   An IV will be inserted into one of your veins.  You will be given one or more of the following: ? A medicine to help you relax (sedative). ? A medicine to numb the area (local anesthetic). ? A medicine to make you fall asleep (general anesthetic).  Your surgeon will use an X-ray machine to see your spinal compression fracture.  A hollow needle will be inserted through the skin and into the spine.  Through this needle, the balloon will be placed in your spine where the fractures are located.  The balloon will be inflated. This will create space and push the bone back toward its normal height and shape.  The balloon will be removed.  The newly created space in your spine will be filled with bone cement.  When the cement hardens, the hollow needle will be removed.  The needle puncture site will be closed with skin glue or adhesive tape.  A bandage (dressing) may be used to cover the needle puncture site. The procedure  may vary among health care providers and hospitals. What happens after the procedure?  Your blood pressure, heart rate, breathing rate, and blood oxygen level will be monitored until you leave the hospital or clinic.  You will have some pain. Pain medicine will be available to help you.  An ice pack may be placed over the needle site.  Do not drive for  24 hours if you were given a sedative during your procedure. Summary  Balloon kyphoplasty is a procedure to treat a spinal compression fracture, which is a collapse of the bones that form the spine (vertebrae).  Before the procedure, follow instructions from your health care provider about eating or drinking restrictions.  Ask your health care provider about changing or stopping your regular medicines. This is especially important if you are taking diabetes medicines or blood thinners.  Plan to have someone take you home from the hospital or clinic. Do not drive for 24 hours if you were given a sedative during your procedure.  If you will be going home right after the procedure, plan to have someone with you for 24 hours. This information is not intended to replace advice given to you by your health care provider. Make sure you discuss any questions you have with your health care provider. Document Revised: 06/28/2018 Document Reviewed: 06/28/2018 Elsevier Patient Education  2020 [employer].

## 2020-07-25 ENCOUNTER — Encounter (HOSPITAL_COMMUNITY): Payer: Medicare Other

## 2020-07-25 DIAGNOSIS — S22069A Unspecified fracture of T7-T8 vertebra, initial encounter for closed fracture: Secondary | ICD-10-CM | POA: Diagnosis not present

## 2020-07-25 DIAGNOSIS — M546 Pain in thoracic spine: Secondary | ICD-10-CM | POA: Diagnosis not present

## 2020-07-27 ENCOUNTER — Encounter (HOSPITAL_COMMUNITY): Payer: Medicare Other

## 2020-07-30 ENCOUNTER — Encounter (HOSPITAL_COMMUNITY): Payer: Medicare Other

## 2020-07-30 ENCOUNTER — Telehealth: Payer: Self-pay

## 2020-07-30 DIAGNOSIS — M4854XA Collapsed vertebra, not elsewhere classified, thoracic region, initial encounter for fracture: Secondary | ICD-10-CM | POA: Diagnosis not present

## 2020-07-30 NOTE — Telephone Encounter (Signed)
Pt husband come by to drop off form to be completed by Dr Lovena Le to release for surgery. Form placed in Dr Lovena Le signature folder.  Pt call back [phone]

## 2020-07-31 ENCOUNTER — Telehealth: Payer: Self-pay

## 2020-07-31 DIAGNOSIS — I5181 Takotsubo syndrome: Secondary | ICD-10-CM

## 2020-07-31 NOTE — Telephone Encounter (Signed)
Pt sent MyChart message also. Responded via MyChart

## 2020-07-31 NOTE — Telephone Encounter (Signed)
Pt daughter calling in and states that pt has already had cardiac clearance and that has been set to [facility]. Form in provider office. Daughter would like to have this faxed today to go ahead and get surgery scheduled. Please advise. Thank you

## 2020-07-31 NOTE — Telephone Encounter (Signed)
done

## 2020-07-31 NOTE — Telephone Encounter (Signed)
Form faxed to [facility] and my chart message sent to patient.

## 2020-07-31 NOTE — Telephone Encounter (Signed)
   [facility] Pre-operative Risk Assessment    Request for surgical clearance:  1. What type of surgery is being performed? T8 & T12 KYPHO   2. When is this surgery scheduled? TBD   3. What type of clearance is required (medical clearance vs. Pharmacy clearance to hold med vs. Both)? MEDICAL  4. Are there any medications that need to be held prior to surgery and how long? NONE LISTED PT TAKES ASA   5. Practice name and name of physician performing surgery? [facility] AMANDA WARD PA-C ATTN:SHERRY   6. What is the office phone number? [phone]   7.   What is the office fax number? [phone]  8.   Anesthesia type (None, local, MAC, general) ? IV SEDATION & LOCAL

## 2020-07-31 NOTE — Telephone Encounter (Signed)
No form in folder. Thx. Dr. Ladona Ridgel

## 2020-07-31 NOTE — Telephone Encounter (Signed)
   Primary Cardiologist: Chrystie Nose, MD  Chart reviewed as part of pre-operative protocol coverage. Patient was contacted 07/31/2020 in reference to pre-operative risk assessment for pending surgery as outlined below.  Jeanne Rogers was last seen on 05/17/20 by Judy Pimple, PA.  Since that day, Jeanne Rogers has done well. Denies chest pain, pressure, tightness, shortness of breath, dyspnea on exertion, orthopnea, PND. She has an exercise tolerance of >4 METS.   She was admitted to [facility] 05/10/20 - 05/11/20 with NSTEMI and Takutsubo cardiomyopathy. She was seen in follow up 05/17/20 with only occasional chest discomfort though improved since addition of Imdur. ARB was unable to be added due to relative hypotension. She was recommended for repeat echocardiogram in 3 months. This is scheduled for 08/18/19 in the [place] office. This testing will remain scheduled as is but does not need to be performed prior to her kyphoplasty.   Therefore, based on ACC/AHA guidelines, the patient would be at acceptable risk for the planned procedure without further cardiovascular testing.   The patient was advised that if she develops new symptoms prior to surgery to contact our office to arrange for a follow-up visit, and she verbalized understanding.  I will route this recommendation to the requesting party via Epic fax function and remove from pre-op pool. Please call with questions.  Jeanne Sorrow, NP 07/31/2020, 8:50 AM

## 2020-07-31 NOTE — Telephone Encounter (Signed)
Pt needs appt with cardiology for pre-op clearance due to history of heart conditions. Thx. Dr. Ladona Ridgel

## 2020-08-01 ENCOUNTER — Encounter (HOSPITAL_COMMUNITY): Payer: Medicare Other

## 2020-08-01 ENCOUNTER — Ambulatory Visit: Payer: Self-pay | Admitting: Orthopedic Surgery

## 2020-08-01 NOTE — Telephone Encounter (Signed)
[facility] called and needed clarification on a surgical clearance. [facility] just needs to know if the patient can hold aspirin for 7 days. Please clarify

## 2020-08-01 NOTE — Telephone Encounter (Signed)
Patient has echo scheduled 08/17/2020

## 2020-08-01 NOTE — Telephone Encounter (Signed)
   Primary Cardiologist: Chrystie Nose, MD  Chart reviewed as part of pre-operative protocol coverage. Case reviewed by Dr. Rennis Golden as well. Given recent heart failure, would be of benefit to complete echocardiogram prior to kyphoplasty. Echocardiogram is scheduled for 08/17/2020. However, if unable to wait would be permissible to proceed per Dr. Rennis Golden.  Given past medical history and time since last visit, based on ACC/AHA guidelines, Jeanne Rogers would be at acceptable risk for the planned procedure without further cardiovascular testing as detailed above.  Per Dr. Rennis Golden, she may hold Aspirin 7 days prior to procedure. Resume at direction of surgeon as soon as safe from surgical perspective.   The patient was advised that if she develops new symptoms prior to surgery to contact our office to arrange for a follow-up visit, and she verbalized understanding.  I will route this recommendation to the requesting party via Epic fax function and remove from pre-op pool.  Please call with questions.  Alver Sorrow, NP 08/01/2020, 2:09 PM

## 2020-08-01 NOTE — Telephone Encounter (Signed)
Dr. Rennis Golden,   Jeanne Rogers is a 79 yo female was admitted to [facility] 05/10/20 - 05/11/20 with NSTEMI and Takutsubo cardiomyopathy. You saw her in consult. She was seen in follow up 05/17/20 with only occasional chest discomfort though improved since addition of Imdur. ARB was unable to be added due to relative hypotension. She was recommended for repeat echocardiogram in 3 months. This is scheduled for 08/18/19 in the [place] office.  We have received request for T8 and T12 kyphoplastywith request to hold Aspirin 7 days prior. Please advise and route response to P CV DIV PREOP.   Thank you,  Alver Sorrow, NP

## 2020-08-01 NOTE — Telephone Encounter (Signed)
Would be good to try and get echo prior to kyphoplasty to further risk stratify given recent heart failure, however, if this cannot be done, would hold aspirin 7 days prior to surgery and proceed.  Dr Rennis Golden

## 2020-08-02 NOTE — Progress Notes (Signed)
[id number] - [place], [place] - [address] AT SEC OF [address] [place] [address] Phone: [phone] Fax: [phone]      Your procedure is scheduled on Wednesday, January 5th.  Report to [facility] Main Entrance "A" at 10:30 A.M., and check in at the Admitting office.  Call this number if you have problems the morning of surgery:  [phone]  Call [phone] if you have any questions prior to your surgery date Monday-Friday 8am-4pm    Remember:  Do not eat after midnight the night before your surgery  You may drink clear liquids until 9:30 AM the morning of your surgery.   Clear liquids allowed are: Water, Non-Citrus Juices (without pulp), Carbonated Beverages, Clear Tea, Black Coffee Only, and Gatorade    Take these medicines the morning of surgery with A SIP OF WATER   Tylenol - if needed  Citalopram (Celexa)  Ezetimibe (Zetia)  Isosorbide Mononitrate (Imdur)  Memantine (Namenda)  Metoprolol Succinate   Follow your surgeon's instructions on when to stop Aspirin.  If no instructions were given by your surgeon then you will need to call the office to get those instructions.    As of today, STOP taking Aleve, Naproxen, Ibuprofen, Motrin, Advil, Goody's, BC's, all herbal medications, fish oil, and all vitamins.                      Do not wear jewelry, make up, or nail polish            Do not wear lotions, powders, perfumes, or deodorant.            Do not shave 48 hours prior to surgery.              Do not bring valuables to the hospital.            [facility] is not responsible for any belongings or valuables.  Do NOT Smoke (Tobacco/Vaping) or drink Alcohol 24 hours prior to your procedure If you use a CPAP at night, you may bring all equipment for your overnight stay.   Contacts, glasses, dentures or bridgework may not be worn into surgery.      For patients admitted to the hospital, discharge time  will be determined by your treatment team.   Patients discharged the day of surgery will not be allowed to drive home, and someone needs to stay with them for 24 hours.    Special instructions:   [place]- Preparing For Surgery  Before surgery, you can play an important role. Because skin is not sterile, your skin needs to be as free of germs as possible. You can reduce the number of germs on your skin by washing with CHG (chlorahexidine gluconate) Soap before surgery.  CHG is an antiseptic cleaner which kills germs and bonds with the skin to continue killing germs even after washing.    Oral Hygiene is also important to reduce your risk of infection.  Remember - BRUSH YOUR TEETH THE MORNING OF SURGERY WITH YOUR REGULAR TOOTHPASTE  Please do not use if you have an allergy to CHG or antibacterial soaps. If your skin becomes reddened/irritated stop using the CHG.  Do not shave (including legs and underarms) for at least 48 hours prior to first CHG shower. It is OK to shave your face.  Please follow these instructions carefully.   1. Shower the [employer] BEFORE SURGERY  and the MORNING OF SURGERY with CHG Soap.   2. If you chose to wash your hair, wash your hair first as usual with your normal shampoo.  3. After you shampoo, rinse your hair and body thoroughly to remove the shampoo.  4. Use CHG as you would any other liquid soap. You can apply CHG directly to the skin and wash gently with a scrungie or a clean washcloth.   5. Apply the CHG Soap to your body ONLY FROM THE NECK DOWN.  Do not use on open wounds or open sores. Avoid contact with your eyes, ears, mouth and genitals (private parts). Wash Face and genitals (private parts)  with your normal soap.   6. Wash thoroughly, paying special attention to the area where your surgery will be performed.  7. Thoroughly rinse your body with warm water from the neck down.  8. DO NOT shower/wash with your normal soap after using and rinsing off  the CHG Soap.  9. Pat yourself dry with a CLEAN TOWEL.  10. Wear CLEAN PAJAMAS to bed the night before surgery  11. Place CLEAN SHEETS on your bed the night of your first shower and DO NOT SLEEP WITH PETS.   Day of Surgery: Wear Clean/Comfortable clothing the morning of surgery Do not apply any deodorants/lotions.   Remember to brush your teeth WITH YOUR REGULAR TOOTHPASTE.   Please read over the following fact sheets that you were given.

## 2020-08-03 ENCOUNTER — Encounter (HOSPITAL_COMMUNITY): Payer: Medicare Other

## 2020-08-06 ENCOUNTER — Ambulatory Visit (HOSPITAL_COMMUNITY)
Admission: RE | Admit: 2020-08-06 | Discharge: 2020-08-06 | Disposition: A | Discharge status: Home / Self Care | Payer: Medicare Other | Source: Ambulatory Visit | Attending: Orthopedic Surgery | Admitting: Orthopedic Surgery

## 2020-08-06 ENCOUNTER — Encounter (HOSPITAL_COMMUNITY): Payer: Medicare Other

## 2020-08-06 ENCOUNTER — Other Ambulatory Visit: Payer: Self-pay

## 2020-08-06 ENCOUNTER — Other Ambulatory Visit (HOSPITAL_COMMUNITY)
Admission: RE | Admit: 2020-08-06 | Discharge: 2020-08-06 | Disposition: A | Discharge status: Home / Self Care | Payer: Medicare Other | Source: Ambulatory Visit | Attending: Orthopedic Surgery | Admitting: Orthopedic Surgery

## 2020-08-06 ENCOUNTER — Encounter (HOSPITAL_COMMUNITY): Payer: Self-pay

## 2020-08-06 ENCOUNTER — Encounter (HOSPITAL_COMMUNITY)
Admission: RE | Admit: 2020-08-06 | Discharge: 2020-08-06 | Disposition: A | Discharge status: Home / Self Care | Payer: Medicare Other | Source: Ambulatory Visit | Attending: Orthopedic Surgery | Admitting: Orthopedic Surgery

## 2020-08-06 DIAGNOSIS — Z01811 Encounter for preprocedural respiratory examination: Secondary | ICD-10-CM

## 2020-08-06 DIAGNOSIS — M4854XA Collapsed vertebra, not elsewhere classified, thoracic region, initial encounter for fracture: Secondary | ICD-10-CM | POA: Diagnosis not present

## 2020-08-06 DIAGNOSIS — Z01818 Encounter for other preprocedural examination: Secondary | ICD-10-CM | POA: Diagnosis not present

## 2020-08-06 DIAGNOSIS — Z7982 Long term (current) use of aspirin: Secondary | ICD-10-CM | POA: Diagnosis not present

## 2020-08-06 DIAGNOSIS — Z01812 Encounter for preprocedural laboratory examination: Secondary | ICD-10-CM | POA: Insufficient documentation

## 2020-08-06 DIAGNOSIS — I700 Atherosclerosis of aorta: Secondary | ICD-10-CM | POA: Insufficient documentation

## 2020-08-06 DIAGNOSIS — K449 Diaphragmatic hernia without obstruction or gangrene: Secondary | ICD-10-CM | POA: Diagnosis not present

## 2020-08-06 DIAGNOSIS — Z79899 Other long term (current) drug therapy: Secondary | ICD-10-CM | POA: Diagnosis not present

## 2020-08-06 DIAGNOSIS — Z20822 Contact with and (suspected) exposure to covid-19: Secondary | ICD-10-CM | POA: Insufficient documentation

## 2020-08-06 HISTORY — DX: Other specified postprocedural states: R11.2

## 2020-08-06 HISTORY — DX: Essential (primary) hypertension: I10

## 2020-08-06 HISTORY — DX: Other specified postprocedural states: Z98.890

## 2020-08-06 HISTORY — DX: Gastro-esophageal reflux disease without esophagitis: K21.9

## 2020-08-06 LAB — CBC
HCT: 42.2 % (ref 36.0–46.0)
Hemoglobin: 14 g/dL (ref 12.0–15.0)
MCH: 31.7 pg (ref 26.0–34.0)
MCHC: 33.2 g/dL (ref 30.0–36.0)
MCV: 95.5 fL (ref 80.0–100.0)
Platelets: 417 10*3/uL — ABNORMAL HIGH (ref 150–400)
RBC: 4.42 MIL/uL (ref 3.87–5.11)
RDW: 13.9 % (ref 11.5–15.5)
WBC: 8.7 10*3/uL (ref 4.0–10.5)
nRBC: 0 % (ref 0.0–0.2)

## 2020-08-06 LAB — URINALYSIS, COMPLETE (UACMP) WITH MICROSCOPIC
Bilirubin Urine: NEGATIVE
Glucose, UA: NEGATIVE mg/dL
Ketones, ur: NEGATIVE mg/dL
Leukocytes,Ua: NEGATIVE
Nitrite: NEGATIVE
Protein, ur: 30 mg/dL — AB
Specific Gravity, Urine: >1.03 — ABNORMAL HIGH (ref 1.005–1.030)
pH: 5.5 (ref 5.0–8.0)

## 2020-08-06 LAB — APTT: aPTT: 29 seconds (ref 24–36)

## 2020-08-06 LAB — BASIC METABOLIC PANEL
Anion gap: 11 (ref 5–15)
BUN: 20 mg/dL (ref 8–23)
CO2: 28 mmol/L (ref 22–32)
Calcium: 9.5 mg/dL (ref 8.9–10.3)
Chloride: 100 mmol/L (ref 98–111)
Creatinine, Ser: 1.05 mg/dL — ABNORMAL HIGH (ref 0.44–1.00)
GFR, Estimated: 54 mL/min — ABNORMAL LOW (ref >60)
Glucose, Bld: 86 mg/dL (ref 70–99)
Potassium: 3.9 mmol/L (ref 3.5–5.1)
Sodium: 139 mmol/L (ref 135–145)

## 2020-08-06 LAB — SARS CORONAVIRUS 2 (TAT 6-24 HRS): SARS Coronavirus 2: NEGATIVE

## 2020-08-06 NOTE — Progress Notes (Addendum)
PCP:   Artemio Aly, MD Cardiologist:  Judy Pimple, PA-C  EKG:  05/15/20 CXR:  08/07/19 ECHO:  05/17/20 Stress Test:  Denies Cardiac Cath:  05/10/20  Fasting Blood Sugar-  N/A Checks Blood Sugar_N/A__ times a day  OSA/CPAP:  No  ASA/Blood Thinners:  Patient has stopped ASA.  No blood thinners  Covid test 08/07/19  Anesthesia Review:  Recent heart workup.  Diagnosed with broken heart syndrome. Abnormal labs  Patient denies shortness of breath, fever, cough, and chest pain at PAT appointment.  Patient verbalized understanding of instructions provided today at the PAT appointment.  Patient asked to review instructions at home and day of surgery.

## 2020-08-07 NOTE — Anesthesia Preprocedure Evaluation (Addendum)
Anesthesia Evaluation  Patient identified by MRN, date of birth, ID band Patient awake    Reviewed: Allergy & Precautions, NPO status , Patient's Chart, lab work & pertinent test results, reviewed documented beta blocker date and time   History of Anesthesia Complications (+) PONV  Airway Mallampati: II  TM Distance: >3 FB Neck ROM: Full    Dental  (+) Caps, Missing, Dental Advisory Given   Pulmonary former smoker,  08/06/2020 SARS coronavirus NEG   breath sounds clear to auscultation       Cardiovascular hypertension, Pt. on medications and Pt. on home beta blockers (-) angina+ Past MI (Takotsubo) and +CHF (Takotsubo EF 30-35%)   Rhythm:Regular Rate:Normal  05/2020 cath: normal coronaries, apical ballooning EF 30-35%  05/2021 ECHO EF 30-35%, apical akinesis, no sig valvular abnormalities   Neuro/Psych Compression fracture    GI/Hepatic Neg liver ROS, GERD  Medicated and Controlled,  Endo/Other  negative endocrine ROS  Renal/GU negative Renal ROS     Musculoskeletal   Abdominal   Peds  Hematology negative hematology ROS (+)   Anesthesia Other Findings   Reproductive/Obstetrics                           Anesthesia Physical Anesthesia Plan  ASA: III  Anesthesia Plan: MAC   Post-op Pain Management:    Induction:   PONV Risk Score and Plan: 3 and Ondansetron, Dexamethasone and Treatment may vary due to age or medical condition  Airway Management Planned: Natural Airway and Simple Face Mask  Additional Equipment: None  Intra-op Plan:   Post-operative Plan:   Informed Consent: I have reviewed the patients History and Physical, chart, labs and discussed the procedure including the risks, benefits and alternatives for the proposed anesthesia with the patient or authorized representative who has indicated his/her understanding and acceptance.     Dental advisory given  Plan  Discussed with: CRNA and Surgeon  Anesthesia Plan Comments: (See APP note by Durel Salts, FNP  Dr Rolena Infante now preferring MAC, pt accepts)     Anesthesia Quick Evaluation

## 2020-08-07 NOTE — Progress Notes (Signed)
Anesthesia Chart Review:   Case: [id number] Date/Time: 08/08/20 0815   Procedure: KYPHOPLASTY T8 AND T12 (N/A ) - 36min   Anesthesia type: Choice   Pre-op diagnosis: T8 and T12 compression fracture   [facility] ROOM [facility] / [facility] OR   Surgeons: Melina Schools, MD      DISCUSSION:  Pt is a 80 year old with hx Takotsubo cardiomyopathy (EF 30-35% on 05/10/20), normal coronaries by cath 05/10/20, HTN.   Pt cleared for surgery by cardiology at acceptable risk (see note by Laurann Montana, NP 08/01/20).  Dr. Debara Pickett note 08/01/20 documents "Would be good to try and get echo prior to kyphoplasty to further risk stratify given recent heart failure, however, if this cannot be done, would hold aspirin 7 days prior to surgery and proceed."  - Note echo is scheduled after surgery on 08/17/20  - Reviewed case with Dr. Linna Caprice.    VS: BP (!) 151/71   Pulse 76   Temp (!) 36.4 C (Oral)   Resp 18   Ht 5\' 4"  (1.626 m)   Wt 51.6 kg   SpO2 100%   BMI 19.52 kg/m    PROVIDERS: - PCP is Erven Colla, DO  - Cardiologist is Lyman Bishop, MD. Last office visit 05/17/20 with Roby Lofts, PA   LABS: Labs reviewed: Acceptable for surgery. (all labs ordered are listed, but only abnormal results are displayed)  Labs Reviewed  BASIC METABOLIC PANEL - Abnormal; Notable for the following components:      Result Value   Creatinine, Ser 1.05 (*)    GFR, Estimated 54 (*)    All other components within normal limits  CBC - Abnormal; Notable for the following components:   Platelets 417 (*)    All other components within normal limits  URINALYSIS, COMPLETE (UACMP) WITH MICROSCOPIC - Abnormal; Notable for the following components:   APPearance HAZY (*)    Specific Gravity, Urine >1.030 (*)    Hgb urine dipstick TRACE (*)    Protein, ur 30 (*)    Non Squamous Epithelial PRESENT (*)    Bacteria, UA FEW (*)    All other components within normal limits  APTT     IMAGES: CXR 08/06/20:  1. No  radiographic evidence of acute cardiopulmonary disease. 2. Aortic atherosclerosis. 3. Large hiatal hernia. 4. New compression fracture of T8 vertebral body with approximately 70% loss of anterior vertebral body height in 20% loss of posterior vertebral body height. Chronic compression fracture of T12 with approximately 50% loss of anterior vertebral body height, similar to prior studies.   EKG 05/14/20:  NSR with sinus arrhythmia. ST & T wave abnormality, consider inferolateral ischemia   CV: Cardiac cath 05/10/20:   Right dominant coronary anatomy  Widely patent left main  Widely patent LAD  Widely patent circumflex  Widely patent RCA  Coronary tortuosity  Apical ballooning/Takotsubo cardiomyopathy with LVEDP 6 mmHg.  EF 30 to 35%.   Echo 05/10/20:  1. Left ventricular EF 30 to 35%. The LV has moderately decreased function. The left ventricle demonstrates regional wall motion abnormalities with mid to apical anteroseptal and inferoseptal akinesis.  Apical lateral, apical anterior, and apical inferior akinesis. Akinesis of the true apex. This pattern suggests LAD territory infarction. No LV thrombus visualized. There is mild left ventricular hypertrophy. Left ventricular diastolic parameters are consistent with Grade I diastolic dysfunction (impaired relaxation).  2. Right ventricular systolic function is normal. The right ventricular size is normal. There is mildly elevated pulmonary  artery systolic pressure. The estimated right ventricular systolic pressure is 36.9 mmHg.  3. The mitral valve is normal in structure. No evidence of mitral valve regurgitation. No evidence of mitral stenosis.  4. The aortic valve is tricuspid. Aortic valve regurgitation is not visualized. No aortic stenosis is present.  5. A small pericardial effusion is present, primarily adjacent to the RV free wall.  6. The inferior vena cava is normal in size with <50% respiratory variability, suggesting right  atrial pressure of 8 mmHg.    Past Medical History:  Diagnosis Date  . Anemia   . Collapse, lung   . Complication of anesthesia    very sensitive to anesthesia  . GERD (gastroesophageal reflux disease)   . Hyperlipidemia   . Hypertension   . Osteoporosis   . PONV (postoperative nausea and vomiting)   . PUD (peptic ulcer disease)   . Spontaneous pneumothorax     Past Surgical History:  Procedure Laterality Date  . COLONOSCOPY N/A 01/04/2014   Procedure: COLONOSCOPY;  Surgeon: Malissa Hippo, MD;  Location: [facility];  Service: Endoscopy;  Laterality: N/A;  200  . ESOPHAGOGASTRODUODENOSCOPY N/A 01/04/2014   Procedure: ESOPHAGOGASTRODUODENOSCOPY (EGD);  Surgeon: Malissa Hippo, MD;  Location: [facility];  Service: Endoscopy;  Laterality: N/A;  . hysterectomy    . LEFT HEART CATH AND CORONARY ANGIOGRAPHY N/A 05/10/2020   Procedure: LEFT HEART CATH AND CORONARY ANGIOGRAPHY;  Surgeon: Lyn Records, MD;  [facility]: [facility] INV[facility];  Service: Cardiovascular;  Laterality: N/A;  . MASS EXCISION Right 02/07/2016   Procedure: EXCISION MASS RIGHT INDEX FINGER AND RIGHT HAND MASS;  Surgeon: Betha Loa, MD;  Location: [facility];  Service: Orthopedics;  Laterality: Right;  . OOPHORECTOMY Bilateral 2005    MEDICATIONS: . acetaminophen (TYLENOL) 500 MG tablet  . alendronate (FOSAMAX) 70 MG tablet  . aspirin EC 81 MG EC tablet  . calcitonin, salmon, (MIACALCIN/FORTICAL) 200 UNIT/ACT nasal spray  . citalopram (CELEXA) 10 MG tablet  . ezetimibe (ZETIA) 10 MG tablet  . isosorbide mononitrate (IMDUR) 30 MG 24 hr tablet  . memantine (NAMENDA) 10 MG tablet  . metoprolol succinate (TOPROL-XL) 25 MG 24 hr tablet  . traMADol (ULTRAM) 50 MG tablet  . zolpidem (AMBIEN) 5 MG tablet   No current facility-administered medications for this encounter.    If no changes, I anticipate pt can proceed with surgery as scheduled.   Rica Mast, PhD, FNP-BC [facility] Short Stay Surgical  Center/Anesthesiology Phone: [phone] 08/07/2020 10:14 AM

## 2020-08-08 ENCOUNTER — Ambulatory Visit (HOSPITAL_COMMUNITY): Payer: Medicare Other | Admitting: Vascular Surgery

## 2020-08-08 ENCOUNTER — Ambulatory Visit (HOSPITAL_COMMUNITY): Payer: Medicare Other

## 2020-08-08 ENCOUNTER — Ambulatory Visit: Payer: Self-pay | Admitting: Orthopedic Surgery

## 2020-08-08 ENCOUNTER — Other Ambulatory Visit: Payer: Self-pay

## 2020-08-08 ENCOUNTER — Ambulatory Visit (HOSPITAL_COMMUNITY): Payer: Medicare Other | Admitting: Anesthesiology

## 2020-08-08 ENCOUNTER — Encounter (HOSPITAL_COMMUNITY): Payer: Medicare Other

## 2020-08-08 ENCOUNTER — Encounter (HOSPITAL_COMMUNITY)
Admission: RE | Disposition: A | Discharge status: Home / Self Care | Payer: Self-pay | Source: Home / Self Care | Attending: Orthopedic Surgery

## 2020-08-08 ENCOUNTER — Encounter (HOSPITAL_COMMUNITY): Payer: Self-pay | Admitting: Orthopedic Surgery

## 2020-08-08 ENCOUNTER — Ambulatory Visit (HOSPITAL_COMMUNITY)
Admission: RE | Admit: 2020-08-08 | Discharge: 2020-08-08 | Disposition: A | Discharge status: Home / Self Care | Payer: Medicare Other | Attending: Orthopedic Surgery | Admitting: Orthopedic Surgery

## 2020-08-08 DIAGNOSIS — Z87891 Personal history of nicotine dependence: Secondary | ICD-10-CM | POA: Insufficient documentation

## 2020-08-08 DIAGNOSIS — M8008XA Age-related osteoporosis with current pathological fracture, vertebra(e), initial encounter for fracture: Secondary | ICD-10-CM | POA: Insufficient documentation

## 2020-08-08 DIAGNOSIS — S22000A Wedge compression fracture of unspecified thoracic vertebra, initial encounter for closed fracture: Secondary | ICD-10-CM | POA: Diagnosis not present

## 2020-08-08 DIAGNOSIS — Z888 Allergy status to other drugs, medicaments and biological substances status: Secondary | ICD-10-CM | POA: Insufficient documentation

## 2020-08-08 DIAGNOSIS — E559 Vitamin D deficiency, unspecified: Secondary | ICD-10-CM | POA: Diagnosis not present

## 2020-08-08 DIAGNOSIS — I214 Non-ST elevation (NSTEMI) myocardial infarction: Secondary | ICD-10-CM | POA: Diagnosis not present

## 2020-08-08 DIAGNOSIS — S22060A Wedge compression fracture of T7-T8 vertebra, initial encounter for closed fracture: Secondary | ICD-10-CM | POA: Diagnosis not present

## 2020-08-08 DIAGNOSIS — S22080A Wedge compression fracture of T11-T12 vertebra, initial encounter for closed fracture: Secondary | ICD-10-CM | POA: Diagnosis not present

## 2020-08-08 DIAGNOSIS — Z419 Encounter for procedure for purposes other than remedying health state, unspecified: Secondary | ICD-10-CM

## 2020-08-08 HISTORY — PX: KYPHOPLASTY: SHX5884

## 2020-08-08 SURGERY — KYPHOPLASTY
Anesthesia: General | Site: Back

## 2020-08-08 MED ORDER — PROPOFOL 500 MG/50ML IV EMUL
INTRAVENOUS | Status: DC | PRN
  Administered 2020-08-08: 50 ug/kg/min via INTRAVENOUS

## 2020-08-08 MED ORDER — LACTATED RINGERS IV SOLN: INTRAVENOUS | Status: DC

## 2020-08-08 MED ORDER — ONDANSETRON HCL 4 MG/2ML IJ SOLN
INTRAMUSCULAR | Status: AC
  Filled 2020-08-08: qty 2

## 2020-08-08 MED ORDER — DEXAMETHASONE SODIUM PHOSPHATE 10 MG/ML IJ SOLN
INTRAMUSCULAR | Status: DC | PRN
  Administered 2020-08-08: 5 mg via INTRAVENOUS

## 2020-08-08 MED ORDER — CHLORHEXIDINE GLUCONATE 0.12 % MT SOLN: 15.0000 mL | Freq: Once | OROMUCOSAL | Status: AC

## 2020-08-08 MED ORDER — DEXAMETHASONE SODIUM PHOSPHATE 10 MG/ML IJ SOLN
INTRAMUSCULAR | Status: AC
  Filled 2020-08-08: qty 1

## 2020-08-08 MED ORDER — BUPIVACAINE LIPOSOME 1.3 % IJ SUSP
20.0000 mL | INTRAMUSCULAR | Status: AC
  Administered 2020-08-08: 17 mL
  Filled 2020-08-08: qty 20

## 2020-08-08 MED ORDER — ACETAMINOPHEN 500 MG PO TABS
1000.0000 mg | ORAL_TABLET | Freq: Once | ORAL | Status: AC
  Administered 2020-08-08: 1000 mg via ORAL
  Filled 2020-08-08: qty 2

## 2020-08-08 MED ORDER — PROPOFOL 10 MG/ML IV BOLUS
INTRAVENOUS | Status: AC
  Filled 2020-08-08: qty 20

## 2020-08-08 MED ORDER — 0.9 % SODIUM CHLORIDE (POUR BTL) OPTIME
TOPICAL | Status: DC | PRN
  Administered 2020-08-08: 1000 mL

## 2020-08-08 MED ORDER — ONDANSETRON HCL 4 MG/2ML IJ SOLN
INTRAMUSCULAR | Status: DC | PRN
  Administered 2020-08-08: 4 mg via INTRAVENOUS

## 2020-08-08 MED ORDER — ACETAMINOPHEN 10 MG/ML IV SOLN
INTRAVENOUS | Status: AC
  Filled 2020-08-08: qty 100

## 2020-08-08 MED ORDER — ORAL CARE MOUTH RINSE: 15.0000 mL | Freq: Once | OROMUCOSAL | Status: AC

## 2020-08-08 MED ORDER — ACETAMINOPHEN 10 MG/ML IV SOLN
INTRAVENOUS | Status: DC | PRN
  Administered 2020-08-08: 1000 mg via INTRAVENOUS

## 2020-08-08 MED ORDER — FENTANYL CITRATE (PF) 250 MCG/5ML IJ SOLN
INTRAMUSCULAR | Status: AC
  Filled 2020-08-08: qty 5

## 2020-08-08 MED ORDER — FENTANYL CITRATE (PF) 250 MCG/5ML IJ SOLN
INTRAMUSCULAR | Status: DC | PRN
  Administered 2020-08-08: 50 ug via INTRAVENOUS
  Administered 2020-08-08: 25 ug via INTRAVENOUS

## 2020-08-08 MED ORDER — CHLORHEXIDINE GLUCONATE 0.12 % MT SOLN
OROMUCOSAL | Status: AC
  Administered 2020-08-08: 15 mL via OROMUCOSAL
  Filled 2020-08-08: qty 15

## 2020-08-08 MED ORDER — BUPIVACAINE-EPINEPHRINE 0.25% -1:200000 IJ SOLN
INTRAMUSCULAR | Status: DC | PRN
  Administered 2020-08-08: 10 mL
  Administered 2020-08-08: 8 mL

## 2020-08-08 MED ORDER — IOPAMIDOL (ISOVUE-300) INJECTION 61%
INTRAVENOUS | Status: DC | PRN
  Administered 2020-08-08: 50 mL

## 2020-08-08 MED ORDER — CEFAZOLIN SODIUM-DEXTROSE 2-4 GM/100ML-% IV SOLN
2.0000 g | INTRAVENOUS | Status: AC
  Administered 2020-08-08: 2 g via INTRAVENOUS
  Filled 2020-08-08: qty 100

## 2020-08-08 SURGICAL SUPPLY — 46 items
ADH SKN CLS APL DERMABOND .7 (GAUZE/BANDAGES/DRESSINGS) ×1
BLADE SURG 15 STRL LF DISP TIS (BLADE) ×1 IMPLANT
BLADE SURG 15 STRL SS (BLADE) ×2
BNDG ADH 1X3 SHEER STRL LF (GAUZE/BANDAGES/DRESSINGS) ×5 IMPLANT
BNDG ADH THN 3X1 STRL LF (GAUZE/BANDAGES/DRESSINGS) ×3
CEMENT KYPHON CX01A KIT/MIXER (Cement) ×2 IMPLANT
COVER MAYO STAND STRL (DRAPES) ×2 IMPLANT
COVER SURGICAL LIGHT HANDLE (MISCELLANEOUS) ×1 IMPLANT
COVER WAND RF STERILE (DRAPES) IMPLANT
CURETTE EXPRESS SZ2 7MM (INSTRUMENTS) IMPLANT
CURETTE WEDGE 8.5MM KYPHX (MISCELLANEOUS) IMPLANT
CURRETTE EXPRESS SZ2 7MM (INSTRUMENTS) ×2
DERMABOND ADVANCED (GAUZE/BANDAGES/DRESSINGS) ×1
DERMABOND ADVANCED .7 DNX12 (GAUZE/BANDAGES/DRESSINGS) ×1 IMPLANT
DRAPE C-ARM 42X72 X-RAY (DRAPES) ×4 IMPLANT
DRAPE INCISE IOBAN 66X45 STRL (DRAPES) ×2 IMPLANT
DRAPE LAPAROTOMY T 102X78X121 (DRAPES) ×2 IMPLANT
DRAPE WARM FLUID 44X44 (DRAPES) ×2 IMPLANT
DRSG OPSITE POSTOP 4X10 (GAUZE/BANDAGES/DRESSINGS) IMPLANT
DURAPREP 26ML APPLICATOR (WOUND CARE) ×2 IMPLANT
GLOVE BIO SURGEON STRL SZ 6.5 (GLOVE) ×3 IMPLANT
GLOVE BIOGEL PI IND STRL 6.5 (GLOVE) ×1 IMPLANT
GLOVE BIOGEL PI IND STRL 8.5 (GLOVE) IMPLANT
GLOVE BIOGEL PI INDICATOR 6.5 (GLOVE) ×2
GLOVE BIOGEL PI INDICATOR 8.5 (GLOVE) ×1
GLOVE SS BIOGEL STRL SZ 8.5 (GLOVE) ×1 IMPLANT
GLOVE SUPERSENSE BIOGEL SZ 8.5 (GLOVE) ×1
GOWN STRL REUS W/ TWL LRG LVL3 (GOWN DISPOSABLE) ×2 IMPLANT
GOWN STRL REUS W/TWL 2XL LVL3 (GOWN DISPOSABLE) ×2 IMPLANT
GOWN STRL REUS W/TWL LRG LVL3 (GOWN DISPOSABLE) ×4
KIT BASIN OR (CUSTOM PROCEDURE TRAY) ×2 IMPLANT
KIT TURNOVER KIT B (KITS) ×2 IMPLANT
NDL SPNL 22GX3.5 QUINCKE BK (NEEDLE) ×1 IMPLANT
NEEDLE HYPO 22GX1.5 SAFETY (NEEDLE) ×2 IMPLANT
NEEDLE SPNL 22GX3.5 QUINCKE BK (NEEDLE) ×2 IMPLANT
NS IRRIG 1000ML POUR BTL (IV SOLUTION) ×2 IMPLANT
PACK SURGICAL SETUP 50X90 (CUSTOM PROCEDURE TRAY) ×2 IMPLANT
PAD ARMBOARD 7.5X6 YLW CONV (MISCELLANEOUS) ×5 IMPLANT
SPONGE LAP 4X18 RFD (DISPOSABLE) ×2 IMPLANT
SUT MNCRL AB 3-0 PS2 18 (SUTURE) ×2 IMPLANT
SYR CONTROL 10ML LL (SYRINGE) ×2 IMPLANT
TAMP BONE INFLATABLE 10/3 (BALLOONS) ×1 IMPLANT
TOWEL GREEN STERILE (TOWEL DISPOSABLE) ×2 IMPLANT
TRAY KYPHOPAK 15/3 ONESTEP 1ST (MISCELLANEOUS) ×2 IMPLANT
TRAY KYPHOPAK 20/3 ONESTEP 1ST (MISCELLANEOUS) IMPLANT
WATER STERILE IRR 1000ML POUR (IV SOLUTION) ×2 IMPLANT

## 2020-08-08 NOTE — H&P (Deleted)
  The note originally documented on this encounter has been moved the the encounter in which it belongs.  

## 2020-08-08 NOTE — H&P (Signed)
Patient was last seen in my office on 07/30/2020.  She continues to have significant mid and lower thoracic pain.  Imaging studies demonstrated compression fractures at T8 and T12.  Patient presents today for kyphoplasty of T8 and T12  There is been no change in clinical exam since her last office visit 07/30/2020.  She remains neurologically intact with significant pain.  I have gone over the surgical procedure with her in great detail including the risks, benefits, and alternatives to surgery the patient has expressed understanding of these risks as well as a willingness to move forward with surgery.  Patient was cleared by her cardiologist.  Noted to to be acceptable risk.  She will required echocardiogram which is scheduled for 08/17/2020.  Full H&P documented by  [facility] PA.

## 2020-08-08 NOTE — Transfer of Care (Signed)
Immediate Anesthesia Transfer of Care Note  Patient: Jeanne Rogers  Procedure(s) Performed: KYPHOPLASTY THORACIC EIGHT AND THORACIC TWELVE (N/A Back)  Patient Location: PACU  Anesthesia Type:MAC  Level of Consciousness: awake, alert , oriented and patient cooperative  Airway & Oxygen Therapy: Patient Spontanous Breathing and Patient connected to nasal cannula oxygen  Post-op Assessment: Report given to RN, Post -op Vital signs reviewed and stable and Patient moving all extremities  Post vital signs: Reviewed and stable  Last Vitals:  Vitals Value Taken Time  BP 160/70 08/08/20 0952  Temp    Pulse 85 08/08/20 0951  Resp 12 08/08/20 0953  SpO2 100 % 08/08/20 0951  Vitals shown include unvalidated device data.  Last Pain:  Vitals:   08/08/20 0642  TempSrc:   PainSc: 0-No pain      Patients Stated Pain Goal: 3 (84/16/60 6301)  Complications: No complications documented.

## 2020-08-08 NOTE — H&P (Signed)
Subjective:   For associated symptoms, patient reports weakness and radiation down leg but reports no numbness and no tingling. For location, she reports bilateral. For quality, she reports aching, stabbing, sharp, and constant. For severity, she reports pain level 9/10. For duration, she reports 4 days and 1 weeks. For timing, she reports acute. For context, she reports cannot identify. For aggravating factors, she reports sitting, standing, walking, changing clothes, getting out of bed, going from sit to stand, and morning. For previous surgery, she reports none. For prior imaging, she reports x ray ([facility]). For previous injections, she reports none. For previous pt, she reports none. T8 comp fx  Patient Active Problem List   Diagnosis Date Noted  . SOB (shortness of breath)   . Left chest pressure   . Takotsubo cardiomyopathy   . NSTEMI (non-ST elevated myocardial infarction) ([place]) 05/09/2020  . Mild cognitive impairment 10/11/2019  . UTI (urinary tract infection) 08/26/2019  . Hyperlipidemia 08/26/2019  . Generalized weakness 08/26/2019  . 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 Anemia, iron deficiency 01/15/2014  . Postural imbalance 01/12/2014  . Low back pain 10/13/2013  . Compression fracture 07/26/2013  . Vitamin D deficiency 07/26/2013  . Osteoporosis 06/29/2013  . Back pain 06/29/2013   Past Medical History:  Diagnosis Date  . Anemia   . Collapse, lung   . Complication of anesthesia    very sensitive to anesthesia  . GERD (gastroesophageal reflux disease)   . Hyperlipidemia   . Hypertension   . Osteoporosis   . PONV (postoperative nausea and vomiting)   . PUD (peptic ulcer disease)   . Spontaneous pneumothorax     Past Surgical History:  Procedure Laterality Date  . COLONOSCOPY N/A 01/04/2014   Procedure: COLONOSCOPY;  Surgeon: Rogene Houston, MD;  Location: [facility];  Service: Endoscopy;  Laterality: N/A;  200  . ESOPHAGOGASTRODUODENOSCOPY N/A 01/04/2014   Procedure:  ESOPHAGOGASTRODUODENOSCOPY (EGD);  Surgeon: Rogene Houston, MD;  Location: [facility];  Service: Endoscopy;  Laterality: N/A;  . hysterectomy    . LEFT HEART CATH AND CORONARY ANGIOGRAPHY N/A 05/10/2020   Procedure: LEFT HEART CATH AND CORONARY ANGIOGRAPHY;  Surgeon: Belva Crome, MD;  Location: [place] [facility];  Service: Cardiovascular;  Laterality: N/A;  . MASS EXCISION Right 02/07/2016   Procedure: EXCISION MASS RIGHT INDEX FINGER AND RIGHT HAND MASS;  Surgeon: Leanora Cover, MD;  Location: [place];  Service: Orthopedics;  Laterality: Right;  . OOPHORECTOMY Bilateral 2005    No current facility-administered medications for this visit.   No current outpatient medications on file.   Facility-Administered Medications Ordered in Other Visits  Medication Dose Route Frequency Provider Last Rate Last Admin  . bupivacaine liposome (EXPAREL) 1.3 % injection 266 mg  20 mL Infiltration To OR Melina Schools, MD      . ceFAZolin (ANCEF) IVPB 2g/100 mL premix  2 g Intravenous 30 min Pre-Op Rosi Secrist, Yvonne Kendall, PA-C      . lactated ringers infusion   Intravenous Continuous Annye Asa, MD       Allergies  Allergen Reactions  . Clopidogrel     Pain in thighs   . Methergine [Methylergonovine Maleate] Other (See Comments)    Unknown    Social History   Tobacco Use  . Smoking status: Former Smoker    Types: Cigarettes  . Smokeless tobacco: Never Used  Substance Use Topics  . Alcohol use: No    Family History  Problem Relation Age of Onset  .  Hyperlipidemia Mother   . Hypertension Father     Review of Systems Pertinent items are noted in HPI.  Objective:   General: AAOX3, well developed and well nourished, NAD Ambulation: antalgic gait pattern, uses no assistive device. Inspection: Increased kyphotic curvature Palpation: Palpation over the midline thoracic spine at approximately the level of the bra strap. No significant tenderness to palpation in the upper  thoracic spine above the bra strap. AROM: - Significant pain is observed with range of motion of the patient's lumbar spine. - Knee: flexion and extension normal and pain free bilaterally. - Ankle: Dorsiflexion, plantarflexion, inversion, eversion normal and pain free.  Dermatomes: Lower extremity sensation to light touch intact bilaterally Myotomes:  - No obvious lower extremity motor deficits on exam, some global lower extremity weakness which is likely due to the patient's age and deconditioning  Reflexes:  - Babinski: Left Ngative, Right Negative - Clonus: Negative  PV: Extremities warm and well profused. Posterior and dorsalis pedis pulse 2+ bilaterally, No pitting Edema, discoloration  MRI Impression: MRI of the thoracic spine performed at [facility] dated 07/25/20 both images as well as the report were personally reviewed by me. There is an acute T8 superior and inferior endplate benign compression deformity with 30% loss of anterior and middle column height without any significant retropulsion. There is a chronic T12 anterior superior endplate wedge fracture deformity.  Assessment:   Jeanne Rogers is a pleasant 80 year old female with acute midline thoracic back pain that began status post a fall. Recent MRI indicates an acute T8 compression deformity as well as a chronic T12 compression deformity. On physical exam the patient does not seem to be significantly tender to palpation over T8, but she is significant tender to palpation over T12 and she is having significant pain interfering with her quality-of-life. After discussing risks and benefits of surgical intervention and alternative treatments including conservative care, the patient has elected to move forward with surgical intervention.  Plan:   We will plan to address both compression deformities at T8 and T12 under IV sedation and local anesthesia pending clearance from the patient's cardiologist and primary care provider. She  was given both of these forms at today's office visit to take to her providers.  Patient is currently on medications for osteoporosis.  Recommend patient discontinue tramadol and use over-the-counter Tylenol and topical lidocaine patches as needed for pain

## 2020-08-08 NOTE — Op Note (Signed)
Operative report  Preoperative diagnosis: T8 and T12 osteoporotic compression fractures  Postoperative diagnosis: Same  Operative procedure: T8 and T12 kyphoplasty  Complications: None  EBL: Minimal  Condition: Stable  Indications patient is a very pleasant 80 year old woman with significant thoracic pain.  Patient had no significant trauma but was diagnosed with a T8 and T12 osteoporotic compression fracture.  As result of the failure to improve with conservative modalities we elected to move forward with surgery.  All appropriate risks benefits and alternatives were discussed with the patient and consent was obtained.  Operative report: Patient was brought the operating room placed prone on the operating room table.  After proper positioning and padding of all bony extremities IV sedation was provided.  Operative timeout was performed in the thoracic spine was prepped and draped in a standard fashion.  Using fluoroscopy identified the lateral borders of the T8 and T12 pedicles and marked amount on the skin surface.  Because of the significant collapse of the T8 fracture I elected just to use a unilateral approach.  The and over the left pedicle was anesthetized with quarter percent Marcaine with epinephrine and a small incision was made.  The trocar was advanced percutaneously down to the lateral aspect of the T8 pedicle.  Using an extrapedicular approach I advanced the Jamshidi needle into the T8 Vertebral body.  I confirmed satisfactory trajectory and positioning in both the AP and lateral planes during the insertion of the Jamshidi needle.  I then use the drill and curette in order to create a space for the inflatable bone tamp.  The 10 cc balloon was then inserted and gently inflated.  There was some inflation and improvement in the bone void.  Cement was then mixed in approximately 1 cc of cement was placed through the Jamshidi needle into the T8 8 vertebral body.  There was a small amount  of leak through the inferior and superior endplates but no posterior or lateral leak was noted.  The cement did manage to cross the midline.  At this point given the severity of the collapse I was quite pleased with the cement mantle.  Once the cement mantle was hardened I then repositioned my Jamshidi needles to the T12 vertebral body.  I again identified the lateral borders of the pedicle anesthetized the skin with quarter percent Marcaine with epinephrine and then made a small incision and advanced the Jamshidi needles down to the lateral aspect of the pedicle.  I was able to take a transpedicular approach at this level.  Using biplane fluoroscopy I was able to confirm trajectory and position of the Jamshidi needle.  Once it was properly seated in the vertebral body I then placed the drill and then sounded the canal to make sure had a solid bony canal.  I then placed the inflatable bone tamp to create the bone void.  The bone tamps were then removed and I inserted a total of approximately 4 cc of cement.  I had excellent fill in the T12 vertebral body.  The cement mantle was properly positioned.  There was no evidence of any leak.  Once the cement mantle was allowed to harden I remove the Jamshidi needles.  At this point I cleaned the skin and then closed the 3 stab incisions with interrupted 3-0 Monocryl sutures.  I then placed additional anesthetic consisting of Marcaine with epinephrine and Exparel.  The patient tolerated the procedure quite well.  Dermabond and a Band-Aid were placed over each of the  stab incision sites and the patient was transferred to the PACU without incident.  The end of the case all needle sponge counts were correct.  There were no adverse intraoperative events.

## 2020-08-08 NOTE — Anesthesia Postprocedure Evaluation (Signed)
Anesthesia Post Note  Patient: Jeanne Rogers  Procedure(s) Performed: KYPHOPLASTY THORACIC EIGHT AND THORACIC TWELVE (N/A Back)     Patient location during evaluation: PACU Anesthesia Type: MAC Level of consciousness: awake and alert, patient cooperative and oriented Pain management: pain level controlled Vital Signs Assessment: post-procedure vital signs reviewed and stable Respiratory status: spontaneous breathing, nonlabored ventilation and respiratory function stable Cardiovascular status: blood pressure returned to baseline and stable Postop Assessment: no apparent nausea or vomiting Anesthetic complications: no   No complications documented.  Last Vitals:  Vitals:   08/08/20 1022 08/08/20 1041  BP: 134/65   Pulse: 69   Resp: 17   Temp:  36.5 C  SpO2: 94%     Last Pain:  Vitals:   08/08/20 1022  TempSrc:   PainSc: 0-No pain                 Brode Sculley,E. Zackory Pudlo

## 2020-08-08 NOTE — Brief Op Note (Signed)
08/08/2020  9:53 AM  PATIENT:  Jeanne Rogers  80 y.o. female  PRE-OPERATIVE DIAGNOSIS:  Thoracic eight and Thoracic twelve compression fracture  POST-OPERATIVE DIAGNOSIS:  Thoracic eight and Thoracic twelve compression fracture  PROCEDURE:  Procedure(s): KYPHOPLASTY THORACIC EIGHT AND THORACIC TWELVE (N/A)  SURGEON:  Surgeon(s) and Role:    Venita Lick, MD - Primary  PHYSICIAN ASSISTANT:   ASSISTANTS: none   ANESTHESIA:   local and IV sedation  EBL:  10 mL   BLOOD ADMINISTERED:none  DRAINS: none   LOCAL MEDICATIONS USED:  MARCAINE    and OTHER exparel  SPECIMEN:  No Specimen  DISPOSITION OF SPECIMEN:  N/A  COUNTS:  YES  TOURNIQUET:  * No tourniquets in log *  DICTATION: .Dragon Dictation  PLAN OF CARE: Discharge to home after PACU  PATIENT DISPOSITION:  PACU - hemodynamically stable.

## 2020-08-09 ENCOUNTER — Encounter (HOSPITAL_COMMUNITY): Payer: Self-pay | Admitting: Orthopedic Surgery

## 2020-08-10 ENCOUNTER — Encounter (HOSPITAL_COMMUNITY): Payer: Medicare Other

## 2020-08-13 ENCOUNTER — Encounter (HOSPITAL_COMMUNITY): Payer: Medicare Other

## 2020-08-13 DIAGNOSIS — I214 Non-ST elevation (NSTEMI) myocardial infarction: Secondary | ICD-10-CM | POA: Insufficient documentation

## 2020-08-14 NOTE — Addendum Note (Signed)
Encounter addended by: Philis Kendall on: 08/14/2020 8:01 AM  Actions taken: Flowsheet data copied forward, Flowsheet accepted

## 2020-08-15 ENCOUNTER — Encounter (HOSPITAL_COMMUNITY): Payer: Medicare Other

## 2020-08-15 NOTE — Addendum Note (Signed)
Encounter addended by: Dwana Melena, RN on: 08/15/2020 9:11 AM  Actions taken: Clinical Note Signed

## 2020-08-15 NOTE — Progress Notes (Signed)
Cardiac Individual Treatment Plan  Patient Details  Name: Jeanne Rogers MRN: [id number] Date of Birth: 12/06/1940 Referring Provider:   Flowsheet Row CARDIAC REHAB PHASE II ORIENTATION from 07/05/2020 in [place]  Referring Provider Dr. Harl Bowie      Initial Encounter Date:  Flowsheet Row CARDIAC REHAB PHASE II ORIENTATION from 07/05/2020 in [place]  Date 07/05/20      Visit Diagnosis: NSTEMI (non-ST elevated myocardial infarction) [facility])  Patient's Home Medications on Admission:  Current Outpatient Medications:  .  acetaminophen (TYLENOL) 500 MG tablet, Take 1,000 mg by mouth every 6 (six) hours as needed for moderate pain., Disp: , Rfl:  .  alendronate (FOSAMAX) 70 MG tablet, Take 1 tablet (70 mg total) by mouth once a week. Take with a full glass of water on an empty stomach. (Patient taking differently: Take 70 mg by mouth every Saturday. Take with a full glass of water on an empty stomach.), Disp: 12 tablet, Rfl: 3 .  citalopram (CELEXA) 10 MG tablet, Take 1 tablet (10 mg total) by mouth daily., Disp: 90 tablet, Rfl: 3 .  ezetimibe (ZETIA) 10 MG tablet, Take 1 tablet (10 mg total) by mouth daily., Disp: 90 tablet, Rfl: 1 .  isosorbide mononitrate (IMDUR) 30 MG 24 hr tablet, Take 1 tablet (30 mg total) by mouth daily., Disp: 30 tablet, Rfl: 3 .  memantine (NAMENDA) 10 MG tablet, Take 1 tablet (10 mg total) by mouth 2 (two) times daily., Disp: 60 tablet, Rfl: 11 .  metoprolol succinate (TOPROL-XL) 25 MG 24 hr tablet, Take 0.5 tablets (12.5 mg total) by mouth daily., Disp: 90 tablet, Rfl: 3 .  zolpidem (AMBIEN) 5 MG tablet, Take 1 tablet (5 mg total) by mouth at bedtime as needed for sleep., Disp: 30 tablet, Rfl: 1  Past Medical History: Past Medical History:  Diagnosis Date  . Anemia   . Collapse, lung   . Complication of anesthesia    very sensitive to anesthesia  . GERD (gastroesophageal reflux disease)   . Hyperlipidemia    . Hypertension   . Osteoporosis   . PONV (postoperative nausea and vomiting)   . PUD (peptic ulcer disease)   . Spontaneous pneumothorax     Tobacco Use: Social History   Tobacco Use  Smoking Status Former Smoker  . Types: Cigarettes  Smokeless Tobacco Never Used    Labs: Recent Review Flowsheet Data    Labs for ITP Cardiac and Pulmonary Rehab Latest Ref Rng & Units 10/18/2014 07/21/2016 11/13/2017 08/25/2019 05/10/2020   Cholestrol 0 - 200 mg/dL 248(H) 251(H) 253(H) 240(H) 231(H)   LDLCALC 0 - 99 mg/dL 164(H) 156(H) 157(H) 150(H) 139(H)   HDL >40 mg/dL 71 77 84 74 71   Trlycerides <150 mg/dL 65 88 60 82 106   Hemoglobin A1c 4.8 - 5.6 % - - - 5.2 5.4      Capillary Blood Glucose: Lab Results  Component Value Date   GLUCAP 103 (H) 08/25/2019     Exercise Target Goals: Exercise Program Goal: Individual exercise prescription set using results from initial 6 min walk test and THRR while considering  patient's activity barriers and safety.   Exercise Prescription Goal: Starting with aerobic activity 30 plus minutes a day, 3 days per week for initial exercise prescription. Provide home exercise prescription and guidelines that participant acknowledges understanding prior to discharge.  Activity Barriers & Risk Stratification:  Activity Barriers & Cardiac Risk Stratification - 07/17/20 1208  Activity Barriers & Cardiac Risk Stratification   Activity Barriers Back Problems;Deconditioning;Muscular Weakness;Balance Concerns    Cardiac Risk Stratification High           6 Minute Walk:  6 Minute Walk    Row Name 07/05/20 1000         6 Minute Walk   Phase Initial     Distance 1250 feet     Walk Time 6 minutes     # of Rest Breaks 0     MPH 2.4     METS 2.7     RPE 8     VO2 Peak 9.56     Symptoms No     Resting HR 67 bpm     Resting BP 130/50     Resting Oxygen Saturation  95 %     Exercise Oxygen Saturation  during 6 min walk 95 %     Max Ex. HR 86 bpm      Max Ex. BP 152/60     2 Minute Post BP 130/58            Oxygen Initial Assessment:   Oxygen Re-Evaluation:   Oxygen Discharge (Final Oxygen Re-Evaluation):   Initial Exercise Prescription:  Initial Exercise Prescription - 07/05/20 1000      Date of Initial Exercise RX and Referring Provider   Date 07/05/20    Referring Provider Dr. Wyline MoodBranch    Expected Discharge Date 09/27/20      NuStep   Level 1    SPM 60    Minutes 22      Arm Ergometer   Level 1    RPM 60    Minutes 17      Prescription Details   Duration Progress to 30 minutes of continuous aerobic without signs/symptoms of physical distress      Intensity   THRR 40-80% of Max Heartrate 57-114    Ratings of Perceived Exertion 11-13      Progression   Progression Continue progressive overload as per policy without signs/symptoms or physical distress.      Resistance Training   Training Prescription Yes    Weight 2    Reps 10-15           Perform Capillary Blood Glucose checks as needed.  Exercise Prescription Changes:   Exercise Prescription Changes    Row Name 07/16/20 1545 07/18/20 1445           Response to Exercise   Blood Pressure (Admit) 152/70 146/78      Blood Pressure (Exercise) 154/70 150/72      Blood Pressure (Exit) 122/80 132/70      Heart Rate (Admit) 75 bpm 81 bpm      Heart Rate (Exercise) 102 bpm 113 bpm      Heart Rate (Exit) 84 bpm 88 bpm      Rating of Perceived Exertion (Exercise) 12 13      Duration Continue with 30 min of aerobic exercise without signs/symptoms of physical distress. Continue with 30 min of aerobic exercise without signs/symptoms of physical distress.      Intensity THRR unchanged THRR unchanged             Progression   Progression -- Continue to progress workloads to maintain intensity without signs/symptoms of physical distress.             Resistance Training   Training Prescription Yes Yes      Weight 1 lb 1 lbs  Reps 10-15 10-15       Time 10 Minutes 10 Minutes             NuStep   Level 1 1      SPM 60 64      Minutes 22 22      METs 1.7 1.8             Arm Ergometer   Level 1 1      RPM 48 50      Minutes 17 17      METs 2 2.1             Exercise Comments:   Exercise Comments    Row Name 07/09/20 1543           Exercise Comments Pt completed first exercise session. She tolerated exercise well and has no complaints.              Exercise Goals and Review:   Exercise Goals    Row Name 07/05/20 1004 07/16/20 1545 08/14/20 0759         Exercise Goals   Increase Physical Activity Yes Yes Yes     Intervention Provide advice, education, support and counseling about physical activity/exercise needs.;Develop an individualized exercise prescription for aerobic and resistive training based on initial evaluation findings, risk stratification, comorbidities and participant's personal goals. Provide advice, education, support and counseling about physical activity/exercise needs.;Develop an individualized exercise prescription for aerobic and resistive training based on initial evaluation findings, risk stratification, comorbidities and participant's personal goals. Provide advice, education, support and counseling about physical activity/exercise needs.;Develop an individualized exercise prescription for aerobic and resistive training based on initial evaluation findings, risk stratification, comorbidities and participant's personal goals.     Expected Outcomes Short Term: Attend rehab on a regular basis to increase amount of physical activity.;Long Term: Add in home exercise to make exercise part of routine and to increase amount of physical activity.;Long Term: Exercising regularly at least 3-5 days a week. Short Term: Attend rehab on a regular basis to increase amount of physical activity.;Long Term: Add in home exercise to make exercise part of routine and to increase amount of physical activity.;Long  Term: Exercising regularly at least 3-5 days a week. Short Term: Attend rehab on a regular basis to increase amount of physical activity.;Long Term: Add in home exercise to make exercise part of routine and to increase amount of physical activity.;Long Term: Exercising regularly at least 3-5 days a week.     Increase Strength and Stamina Yes Yes Yes     Intervention Provide advice, education, support and counseling about physical activity/exercise needs.;Develop an individualized exercise prescription for aerobic and resistive training based on initial evaluation findings, risk stratification, comorbidities and participant's personal goals. Provide advice, education, support and counseling about physical activity/exercise needs.;Develop an individualized exercise prescription for aerobic and resistive training based on initial evaluation findings, risk stratification, comorbidities and participant's personal goals. Provide advice, education, support and counseling about physical activity/exercise needs.;Develop an individualized exercise prescription for aerobic and resistive training based on initial evaluation findings, risk stratification, comorbidities and participant's personal goals.     Expected Outcomes Short Term: Increase workloads from initial exercise prescription for resistance, speed, and METs.;Short Term: Perform resistance training exercises routinely during rehab and add in resistance training at home;Long Term: Improve cardiorespiratory fitness, muscular endurance and strength as measured by increased METs and functional capacity (6MWT) Short Term: Increase workloads from initial exercise prescription for resistance, speed, and METs.;Short  Term: Perform resistance training exercises routinely during rehab and add in resistance training at home;Long Term: Improve cardiorespiratory fitness, muscular endurance and strength as measured by increased METs and functional capacity (6MWT) Short Term:  Increase workloads from initial exercise prescription for resistance, speed, and METs.;Short Term: Perform resistance training exercises routinely during rehab and add in resistance training at home;Long Term: Improve cardiorespiratory fitness, muscular endurance and strength as measured by increased METs and functional capacity (6MWT)     Able to understand and use rate of perceived exertion (RPE) scale Yes Yes Yes     Intervention Provide education and explanation on how to use RPE scale Provide education and explanation on how to use RPE scale Provide education and explanation on how to use RPE scale     Expected Outcomes Short Term: Able to use RPE daily in rehab to express subjective intensity level;Long Term:  Able to use RPE to guide intensity level when exercising independently Short Term: Able to use RPE daily in rehab to express subjective intensity level;Long Term:  Able to use RPE to guide intensity level when exercising independently Short Term: Able to use RPE daily in rehab to express subjective intensity level;Long Term:  Able to use RPE to guide intensity level when exercising independently     Knowledge and understanding of Target Heart Rate Range (THRR) Yes Yes Yes     Intervention Provide education and explanation of THRR including how the numbers were predicted and where they are located for reference Provide education and explanation of THRR including how the numbers were predicted and where they are located for reference Provide education and explanation of THRR including how the numbers were predicted and where they are located for reference     Expected Outcomes Short Term: Able to state/look up THRR;Long Term: Able to use THRR to govern intensity when exercising independently;Short Term: Able to use daily as guideline for intensity in rehab Short Term: Able to state/look up THRR;Long Term: Able to use THRR to govern intensity when exercising independently;Short Term: Able to use daily  as guideline for intensity in rehab Short Term: Able to state/look up THRR;Long Term: Able to use THRR to govern intensity when exercising independently;Short Term: Able to use daily as guideline for intensity in rehab     Able to check pulse independently Yes Yes Yes     Intervention Provide education and demonstration on how to check pulse in carotid and radial arteries.;Review the importance of being able to check your own pulse for safety during independent exercise Provide education and demonstration on how to check pulse in carotid and radial arteries.;Review the importance of being able to check your own pulse for safety during independent exercise Provide education and demonstration on how to check pulse in carotid and radial arteries.;Review the importance of being able to check your own pulse for safety during independent exercise     Expected Outcomes Short Term: Able to explain why pulse checking is important during independent exercise;Long Term: Able to check pulse independently and accurately Short Term: Able to explain why pulse checking is important during independent exercise;Long Term: Able to check pulse independently and accurately Short Term: Able to explain why pulse checking is important during independent exercise;Long Term: Able to check pulse independently and accurately     Understanding of Exercise Prescription Yes Yes Yes     Intervention Provide education, explanation, and written materials on patient's individual exercise prescription Provide education, explanation, and written materials on patient's individual exercise  prescription Provide education, explanation, and written materials on patient's individual exercise prescription     Expected Outcomes Short Term: Able to explain program exercise prescription;Long Term: Able to explain home exercise prescription to exercise independently Short Term: Able to explain program exercise prescription;Long Term: Able to explain home  exercise prescription to exercise independently Short Term: Able to explain program exercise prescription;Long Term: Able to explain home exercise prescription to exercise independently            Exercise Goals Re-Evaluation :  Exercise Goals Re-Evaluation    Row Name 07/16/20 1545 08/14/20 0759           Exercise Goal Re-Evaluation   Exercise Goals Review Increase Physical Activity;Increase Strength and Stamina;Able to understand and use rate of perceived exertion (RPE) scale;Knowledge and understanding of Target Heart Rate Range (THRR);Able to check pulse independently;Understanding of Exercise Prescription Increase Physical Activity;Increase Strength and Stamina;Able to understand and use rate of perceived exertion (RPE) scale;Knowledge and understanding of Target Heart Rate Range (THRR);Able to check pulse independently;Understanding of Exercise Prescription      Comments Patient has completed 4 exercise session. She has tolerated exercise well and has been progressing well. She is currently exercising at 1.7 METs on the NuStep. Will continue to monitor and progress exercise as able. Pt has completed 5 exercise sessions. She has been absent since 07/18/2020 due to compression fractures in her back. She recently underwent a kyphoplasty surgery and it is unclear if/when she will return to rehab. We will follow up with her again soon to see if she will be able to to return to rehab. Will monitor and progress as able.      Expected Outcomes Through exercise at rehab and a home exercise program, patient will reach their goals. Through exercise at rehab and a home exercise program, patient will reach their goals.              Discharge Exercise Prescription (Final Exercise Prescription Changes):  Exercise Prescription Changes - 07/18/20 1445      Response to Exercise   Blood Pressure (Admit) 146/78    Blood Pressure (Exercise) 150/72    Blood Pressure (Exit) 132/70    Heart Rate (Admit)  81 bpm    Heart Rate (Exercise) 113 bpm    Heart Rate (Exit) 88 bpm    Rating of Perceived Exertion (Exercise) 13    Duration Continue with 30 min of aerobic exercise without signs/symptoms of physical distress.    Intensity THRR unchanged      Progression   Progression Continue to progress workloads to maintain intensity without signs/symptoms of physical distress.      Resistance Training   Training Prescription Yes    Weight 1 lbs    Reps 10-15    Time 10 Minutes      NuStep   Level 1    SPM 64    Minutes 22    METs 1.8      Arm Ergometer   Level 1    RPM 50    Minutes 17    METs 2.1           Nutrition:  Target Goals: Understanding of nutrition guidelines, daily intake of sodium 1500mg , cholesterol 200mg , calories 30% from fat and 7% or less from saturated fats, daily to have 5 or more servings of fruits and vegetables.  Biometrics:  Pre Biometrics - 07/16/20 1545      Pre Biometrics   Weight 52.8 kg  BMI (Calculated) 19.97            Nutrition Therapy Plan and Nutrition Goals:  Nutrition Therapy & Goals - 07/13/20 1437      Personal Nutrition Goals   Comments We will continue to provide heart healthy nutritional education through hand-outs.      Intervention Plan   Intervention Nutrition handout(s) given to patient.           Nutrition Assessments:  Nutrition Assessments - 07/05/20 0958      MEDFICTS Scores   Pre Score 22          MEDIFICTS Score Key:  ?70 Need to make dietary changes   40-70 Heart Healthy Diet  ? 40 Therapeutic Level Cholesterol Diet   Picture Your Plate Scores:  <41 Unhealthy dietary pattern with much room for improvement.  41-50 Dietary pattern unlikely to meet recommendations for good health and room for improvement.  51-60 More healthful dietary pattern, with some room for improvement.   >60 Healthy dietary pattern, although there may be some specific behaviors that could be improved.    Nutrition  Goals Re-Evaluation:   Nutrition Goals Discharge (Final Nutrition Goals Re-Evaluation):   Psychosocial: Target Goals: Acknowledge presence or absence of significant depression and/or stress, maximize coping skills, provide positive support system. Participant is able to verbalize types and ability to use techniques and skills needed for reducing stress and depression.  Initial Review & Psychosocial Screening:  Initial Psych Review & Screening - 07/05/20 0943      Initial Review   Current issues with None Identified      Family Dynamics   Good Support System? Yes    Comments Patient lives with her husband of 47 years. She reports he and her children are her support system along with some friends. She is very active in her community and has a positive outlook. She denies any depression or anxiety. She was evaluated by a neurologist in June 2021 for mild memory loss and irritability. She was diagnosed with mild cognitive impairment and was started on Citalopram 10 mg daily to help wth her mood and irritability. She also takes Namenda 10 mg BID. Will continue to monitor.      Barriers   Psychosocial barriers to participate in program There are no identifiable barriers or psychosocial needs.      Screening Interventions   Interventions Encouraged to exercise    Expected Outcomes Short Term goal: Identification and review with participant of any Quality of Life or Depression concerns found by scoring the questionnaire.;Long Term goal: The participant improves quality of Life and PHQ9 Scores as seen by post scores and/or verbalization of changes           Quality of Life Scores:  Quality of Life - 07/05/20 0959      Quality of Life   Select Quality of Life      Quality of Life Scores   Health/Function Pre 26.62 %    Socioeconomic Pre 28.75 %    Psych/Spiritual Pre 26.5 %    Family Pre 28.8 %    GLOBAL Pre 27.38 %          Scores of 19 and below usually indicate a poorer quality  of life in these areas.  A difference of  2-3 points is a clinically meaningful difference.  A difference of 2-3 points in the total score of the Quality of Life Index has been associated with significant improvement in overall quality of life, self-image,  physical symptoms, and general health in studies assessing change in quality of life.  PHQ-9: Recent Review Flowsheet Data    Depression screen [facility] 2/9 07/05/2020 04/16/2020 11/09/2017 07/23/2016   Decreased Interest 0 0 0 0   Down, Depressed, Hopeless 0 1 0 0   PHQ - 2 Score 0 1 0 0   Altered sleeping 1 1 - -   Tired, decreased energy 1 0 - -   Change in appetite 0 0 - -   Feeling bad or failure about yourself  0 0 - -   Trouble concentrating 0 0 - -   Moving slowly or fidgety/restless 0 0 - -   Suicidal thoughts 0 0 - -   PHQ-9 Score 2 2 - -   Difficult doing work/chores Not difficult at all Not difficult at all - -     Interpretation of Total Score  Total Score Depression Severity:  1-4 = Minimal depression, 5-9 = Mild depression, 10-14 = Moderate depression, 15-19 = Moderately severe depression, 20-27 = Severe depression   Psychosocial Evaluation and Intervention:  Psychosocial Evaluation - 07/05/20 0955      Psychosocial Evaluation & Interventions   Interventions Stress management education;Relaxation education;Encouraged to exercise with the program and follow exercise prescription    Comments Patient has no psychosocial barriers identified at her orientation visit. Her initial QOL socre was 27.38 overal. and her PHQ-9 score was 2. Will continue to monitor.    Expected Outcomes Patient will have no psychosocial issues identified at discharge.    Continue Psychosocial Services  No Follow up required           Psychosocial Re-Evaluation:  Psychosocial Re-Evaluation    [place] Name 07/13/20 1438             Psychosocial Re-Evaluation   Current issues with None Identified       Comments Patient continue to have no  psychosocial issues identified. She demonstrates a positive outlook regarding her future and seems to enjoy coming to the program. Will continue to monitor.       Expected Outcomes Patient will have no psychosocial issues identified at discharge.       Interventions Stress management education;Encouraged to attend Cardiac Rehabilitation for the exercise;Relaxation education       Continue Psychosocial Services  No Follow up required              Psychosocial Discharge (Final Psychosocial Re-Evaluation):  Psychosocial Re-Evaluation - 07/13/20 1438      Psychosocial Re-Evaluation   Current issues with None Identified    Comments Patient continue to have no psychosocial issues identified. She demonstrates a positive outlook regarding her future and seems to enjoy coming to the program. Will continue to monitor.    Expected Outcomes Patient will have no psychosocial issues identified at discharge.    Interventions Stress management education;Encouraged to attend Cardiac Rehabilitation for the exercise;Relaxation education    Continue Psychosocial Services  No Follow up required           Vocational Rehabilitation: Provide vocational rehab assistance to qualifying candidates.   Vocational Rehab Evaluation & Intervention:  Vocational Rehab - 07/05/20 0959      Initial Vocational Rehab Evaluation & Intervention   Assessment shows need for Vocational Rehabilitation No      Vocational Rehab Re-Evaulation   Comments Patient is retired and is not interest in returning to work. She does not need vocational rehab.  Education: Education Goals: Education classes will be provided on a weekly basis, covering required topics. Participant will state understanding/return demonstration of topics presented.  Learning Barriers/Preferences:  Learning Barriers/Preferences - 07/05/20 [id number]      Learning Barriers/Preferences   Learning Barriers None    Learning Preferences  Video;Audio;Written Material           Education Topics: Hypertension, Hypertension Reduction -Define heart disease and high blood pressure. Discus how high blood pressure affects the body and ways to reduce high blood pressure.   Exercise and Your Heart -Discuss why it is important to exercise, the FITT principles of exercise, normal and abnormal responses to exercise, and how to exercise safely.   Angina -Discuss definition of angina, causes of angina, treatment of angina, and how to decrease risk of having angina.   Cardiac Medications -Review what the following cardiac medications are used for, how they affect the body, and side effects that may occur when taking the medications.  Medications include Aspirin, Beta blockers, calcium channel blockers, ACE Inhibitors, angiotensin receptor blockers, diuretics, digoxin, and antihyperlipidemics.   Congestive Heart Failure -Discuss the definition of CHF, how to live with CHF, the signs and symptoms of CHF, and how keep track of weight and sodium intake.   Heart Disease and Intimacy -Discus the effect sexual activity has on the heart, how changes occur during intimacy as we age, and safety during sexual activity.   Smoking Cessation / COPD -Discuss different methods to quit smoking, the health benefits of quitting smoking, and the definition of COPD.   Nutrition I: Fats -Discuss the types of cholesterol, what cholesterol does to the heart, and how cholesterol levels can be controlled.   Nutrition II: Labels -Discuss the different components of food labels and how to read food label   Heart Parts/Heart Disease and PAD -Discuss the anatomy of the heart, the pathway of blood circulation through the heart, and these are affected by heart disease.   Stress I: Signs and Symptoms -Discuss the causes of stress, how stress may lead to anxiety and depression, and ways to limit stress. Flowsheet Row CARDIAC REHAB PHASE II EXERCISE  from 07/18/2020 in [place]  Date 07/18/20  Educator [facility]  Instruction Review Code 2- Demonstrated Understanding      Stress II: Relaxation -Discuss different types of relaxation techniques to limit stress.   Warning Signs of Stroke / TIA -Discuss definition of a stroke, what the signs and symptoms are of a stroke, and how to identify when someone is having stroke.   Knowledge Questionnaire Score:  Knowledge Questionnaire Score - 07/05/20 0958      Knowledge Questionnaire Score   Pre Score 16/24           Core Components/Risk Factors/Patient Goals at Admission:  Personal Goals and Risk Factors at Admission - 07/05/20 0959      Core Components/Risk Factors/Patient Goals on Admission    Weight Management Weight Loss    Personal Goal Other Yes    Personal Goal Get stronger. Get healthier. Be able to do her ADL's.    Intervention Patient will attend CR 3 days/week and supplement with exercise at home 2 days/week.    Expected Outcomes Patient will meet both personal and program goals.           Core Components/Risk Factors/Patient Goals Review:   Goals and Risk Factor Review    Row Name 07/13/20 1439 08/09/20 0732  Core Components/Risk Factors/Patient Goals Review   Personal Goals Review Other --      Review Patient is new to the program completing 3 sessions maintaining her weight since her initial visit. She has multiple risk factors for CAD and is here for risk modification. Her personal goals are to get stronger; be albe to do her ADL's and gain confidence. Will continue to monitor for progress. Patient is currently not attending sessions due to compression fractures. She had a kyphoplasty procedure yesterday 08/08/20. Will continue to monitor if she will be able to return to the program.      Expected Outcomes Patient will complete the program meeting both personal and program goals. Patient will complete the program meeting both personal  and program goals.             Core Components/Risk Factors/Patient Goals at Discharge (Final Review):   Goals and Risk Factor Review - 08/09/20 0732      Core Components/Risk Factors/Patient Goals Review   Review Patient is currently not attending sessions due to compression fractures. She had a kyphoplasty procedure yesterday 08/08/20. Will continue to monitor if she will be able to return to the program.    Expected Outcomes Patient will complete the program meeting both personal and program goals.           ITP Comments:   Comments: ITP REVIEW Pt is making expected progress toward Cardiac Rehab goals after completing 5 sessions. Patient has been out with back pain. She has a procedure 08/08/20 and hopefully she will be able to return to the program.

## 2020-08-17 ENCOUNTER — Encounter (HOSPITAL_COMMUNITY): Payer: Medicare Other

## 2020-08-17 ENCOUNTER — Other Ambulatory Visit: Payer: Self-pay

## 2020-08-17 ENCOUNTER — Ambulatory Visit (HOSPITAL_COMMUNITY)
Admission: RE | Admit: 2020-08-17 | Discharge: 2020-08-17 | Disposition: A | Discharge status: Home / Self Care | Payer: Medicare Other | Source: Ambulatory Visit | Attending: Medical | Admitting: Medical

## 2020-08-17 DIAGNOSIS — I5181 Takotsubo syndrome: Secondary | ICD-10-CM | POA: Insufficient documentation

## 2020-08-17 LAB — ECHOCARDIOGRAM COMPLETE
AR max vel: 2.13 cm2
AV Area VTI: 1.93 cm2
AV Area mean vel: 2.13 cm2
AV Mean grad: 2.4 mmHg
AV Peak grad: 5.2 mmHg
Ao pk vel: 1.14 m/s
Area-P 1/2: 3.19 cm2
S' Lateral: 2.2 cm

## 2020-08-17 NOTE — Progress Notes (Signed)
*  PRELIMINARY RESULTS* Echocardiogram 2D Echocardiogram has been performed.  Jeanne Rogers 08/17/2020, 11:36 AM

## 2020-08-20 ENCOUNTER — Encounter (HOSPITAL_COMMUNITY): Payer: Medicare Other

## 2020-08-21 ENCOUNTER — Ambulatory Visit: Payer: Medicare Other | Admitting: Student

## 2020-08-22 ENCOUNTER — Encounter (HOSPITAL_COMMUNITY): Payer: Medicare Other

## 2020-08-24 ENCOUNTER — Encounter (HOSPITAL_COMMUNITY): Payer: Medicare Other

## 2020-08-27 ENCOUNTER — Other Ambulatory Visit: Payer: Self-pay

## 2020-08-27 ENCOUNTER — Encounter (HOSPITAL_COMMUNITY)
Admission: RE | Admit: 2020-08-27 | Discharge: 2020-08-27 | Disposition: A | Discharge status: Home / Self Care | Payer: Medicare Other | Source: Ambulatory Visit | Attending: Cardiology | Admitting: Cardiology

## 2020-08-27 VITALS — Wt 113.8 lb

## 2020-08-27 DIAGNOSIS — I214 Non-ST elevation (NSTEMI) myocardial infarction: Secondary | ICD-10-CM

## 2020-08-27 NOTE — Progress Notes (Signed)
Daily Session Note  Patient Details  Name: Jeanne Rogers MRN: [id number] Date of Birth: 09/01/1940 Referring Provider:   Flowsheet Row CARDIAC REHAB PHASE II ORIENTATION from 07/05/2020 in [place]  Referring Provider Dr. Harl Bowie      Encounter Date: 08/27/2020  Check In:  Session Check In - 08/27/20 1445      Check-In   Supervising physician immediately available to respond to emergencies [facility] MD immediately available    Physician(s) Dr. Harl Bowie    Location [facility]    Staff Present Cathren Harsh, MS, Exercise Physiologist;Dalton Kris Mouton, MS, ACSM-CEP, Exercise Physiologist    Virtual Visit No    Medication changes reported     No    Fall or balance concerns reported    No    Tobacco Cessation No Change    Warm-up and Cool-down Performed as group-led instruction    Resistance Training Performed Yes    VAD Patient? No    PAD/SET Patient? No      Pain Assessment   Currently in Pain? Yes    Pain Location Back    Pain Orientation Mid    Multiple Pain Sites No           Capillary Blood Glucose: No results found for this or any previous visit (from the past 24 hour(s)).    Social History   Tobacco Use  Smoking Status Former Smoker  . Types: Cigarettes  Smokeless Tobacco Never Used    Goals Met:  Independence with exercise equipment Exercise tolerated well No report of cardiac concerns or symptoms Strength training completed today  Goals Unmet:  Not Applicable  Comments: check out 1545   Dr. Kathie Dike is Medical Director for [facility] Pulmonary Rehab.

## 2020-08-29 ENCOUNTER — Encounter (HOSPITAL_COMMUNITY)
Admission: RE | Admit: 2020-08-29 | Discharge: 2020-08-29 | Disposition: A | Discharge status: Home / Self Care | Payer: Medicare Other | Source: Ambulatory Visit | Attending: Cardiology | Admitting: Cardiology

## 2020-08-29 ENCOUNTER — Other Ambulatory Visit: Payer: Self-pay

## 2020-08-29 DIAGNOSIS — I214 Non-ST elevation (NSTEMI) myocardial infarction: Secondary | ICD-10-CM | POA: Diagnosis not present

## 2020-08-29 NOTE — Progress Notes (Signed)
Daily Session Note  Patient Details  Name: Jeanne Rogers MRN: [id number] Date of Birth: Apr 19, 1941 Referring Provider:   Flowsheet Row CARDIAC REHAB PHASE II ORIENTATION from 07/05/2020 in [place]  Referring Provider Dr. Harl Bowie      Encounter Date: 08/29/2020  Check In:  Session Check In - 08/29/20 1445      Check-In   Supervising physician immediately available to respond to emergencies [facility] MD immediately available    Physician(s) Dr. Domenic Polite    Location [facility]    Staff Present Aundra Dubin, RN, BSN;Quitman Norberto Audria Nine, MS, Exercise Physiologist    Virtual Visit No    Medication changes reported     No    Fall or balance concerns reported    No    Tobacco Cessation No Change    Warm-up and Cool-down Performed as group-led instruction    Resistance Training Performed Yes    VAD Patient? No    PAD/SET Patient? No      Pain Assessment   Currently in Pain? No/denies    Pain Location Back    Pain Orientation Mid    Multiple Pain Sites No           Capillary Blood Glucose: No results found for this or any previous visit (from the past 24 hour(s)).    Social History   Tobacco Use  Smoking Status Former Smoker  . Types: Cigarettes  Smokeless Tobacco Never Used    Goals Met:  Independence with exercise equipment Exercise tolerated well No report of cardiac concerns or symptoms Strength training completed today  Goals Unmet:  Not Applicable  Comments: check out 1545   Dr. Kathie Dike is Medical Director for [facility] Pulmonary Rehab.

## 2020-08-31 ENCOUNTER — Encounter (HOSPITAL_COMMUNITY)
Admission: RE | Admit: 2020-08-31 | Discharge: 2020-08-31 | Disposition: A | Discharge status: Home / Self Care | Payer: Medicare Other | Source: Ambulatory Visit | Attending: Cardiology | Admitting: Cardiology

## 2020-08-31 ENCOUNTER — Other Ambulatory Visit: Payer: Self-pay

## 2020-08-31 DIAGNOSIS — I214 Non-ST elevation (NSTEMI) myocardial infarction: Secondary | ICD-10-CM

## 2020-08-31 NOTE — Progress Notes (Signed)
Daily Session Note  Patient Details  Name: Jeanne Rogers MRN: [id number] Date of Birth: 08-21-1940 Referring Provider:   Flowsheet Row CARDIAC REHAB PHASE II ORIENTATION from 07/05/2020 in [place]  Referring Provider Dr. Harl Bowie      Encounter Date: 08/31/2020  Check In:  Session Check In - 08/31/20 1445      Check-In   Supervising physician immediately available to respond to emergencies [facility] MD immediately available    Physician(s) Dr. Harl Bowie    Location [facility]    Staff Present Cathren Harsh, MS, Exercise Physiologist;Dalton Kris Mouton, MS, ACSM-CEP, Exercise Physiologist    Virtual Visit No    Medication changes reported     No    Fall or balance concerns reported    No    Tobacco Cessation No Change    Warm-up and Cool-down Performed as group-led instruction    Resistance Training Performed Yes    VAD Patient? No    PAD/SET Patient? No      Pain Assessment   Currently in Pain? No/denies    Pain Score 5     Pain Location Back    Pain Orientation Mid    Multiple Pain Sites No           Capillary Blood Glucose: No results found for this or any previous visit (from the past 24 hour(s)).    Social History   Tobacco Use  Smoking Status Former Smoker  . Types: Cigarettes  Smokeless Tobacco Never Used    Goals Met:  Independence with exercise equipment Exercise tolerated well No report of cardiac concerns or symptoms Strength training completed today  Goals Unmet:  Not Applicable  Comments: check out 1545   Dr. Kathie Dike is Medical Director for [facility] Pulmonary Rehab.

## 2020-09-03 ENCOUNTER — Encounter (HOSPITAL_COMMUNITY)
Admission: RE | Admit: 2020-09-03 | Discharge: 2020-09-03 | Disposition: A | Discharge status: Home / Self Care | Payer: Medicare Other | Source: Ambulatory Visit | Attending: Cardiology | Admitting: Cardiology

## 2020-09-03 ENCOUNTER — Other Ambulatory Visit: Payer: Self-pay

## 2020-09-03 DIAGNOSIS — I214 Non-ST elevation (NSTEMI) myocardial infarction: Secondary | ICD-10-CM | POA: Diagnosis not present

## 2020-09-03 NOTE — Progress Notes (Signed)
Daily Session Note  Patient Details  Name: Diasha Castleman MRN: [id number] Date of Birth: 23-Sep-1940 Referring Provider:   Flowsheet Row CARDIAC REHAB PHASE II ORIENTATION from 07/05/2020 in [place]  Referring Provider Dr. Harl Bowie      Encounter Date: 09/03/2020  Check In:  Session Check In - 09/03/20 [place]      Check-In   Supervising physician immediately available to respond to emergencies [facility] MD immediately available    Physician(s) Domenic Polite    Location [facility]    Staff Present [name], RN;[name], RN, BSN;[place] Audria Nine, MS, Exercise Physiologist;Dalton Kris Mouton, MS, ACSM-CEP, Exercise Physiologist    Virtual Visit No    Medication changes reported     No    Fall or balance concerns reported    No    Tobacco Cessation No Change    Warm-up and Cool-down Performed as group-led instruction    Resistance Training Performed Yes    VAD Patient? No    PAD/SET Patient? No      Pain Assessment   Currently in Pain? No/denies    Pain Score 0-No pain    Multiple Pain Sites No           Capillary Blood Glucose: No results found for this or any previous visit (from the past 24 hour(s)).    Social History   Tobacco Use  Smoking Status Former Smoker  . Types: Cigarettes  Smokeless Tobacco Never Used    Goals Met:  Independence with exercise equipment Exercise tolerated well No report of cardiac concerns or symptoms Strength training completed today  Goals Unmet:  Not Applicable  Comments: check out @ 3:45   Dr. Kathie Dike is Medical Director for [facility] Pulmonary Rehab.

## 2020-09-04 NOTE — Progress Notes (Signed)
Cardiology Office Note:    Date:  09/05/2020   ID:  Luisana Lutzke, DOB 02-Jun-1941, MRN [id number]  PCP:  Erven Colla, DO  [place] Cardiologist: Will establish with Dr. Domenic Polite or Dr. Driscilla [facility] Electrophysiologist:  None   Referring MD: Erven Colla, DO   Chief Complaint:  Follow-up (Cardiomyopathy)    Patient Profile:    Jeanne Rogers is a 80 y.o. 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  EF 30 to 35%.  Echocardiogram 05/10/20 EF 30-35, apical lat, ant, inf AK, Gr 1 DD, normal RVSF, RVSP 36.9, small eff   History of Present Illness:    Ms. Muckle was last seen in 10/21 for post hospital f/u by Roby Lofts, PA-C.  Echocardiogram in 1/22 demonstrated recovered LVF with normal EF.  She returns for f/u.  She is here with her husband.  She continues to go to cardiac rehabilitation.  She recently had vertebroplasty due to vertebral compression fracture.  She was off of aspirin for this.  She has not had chest discomfort, shortness of breath, syncope, orthopnea, leg edema.      Past Medical History:  Diagnosis Date  . Anemia   . Collapse, lung   . Complication of anesthesia    very sensitive to anesthesia  . GERD (gastroesophageal reflux disease)   . Hyperlipidemia   . Hypertension   . Osteoporosis   . PONV (postoperative nausea and  vomiting)   . PUD (peptic ulcer disease)   . Spontaneous pneumothorax     Current Medications: Current Meds  Medication Sig  . acetaminophen (TYLENOL) 500 MG tablet Take 1,000 mg by mouth every 6 (six) hours as needed for moderate pain.  Marland Kitchen alendronate (FOSAMAX) 70 MG tablet Take 1 tablet (70 mg total) by mouth once a week. Take with a full glass of water on an empty stomach. (Patient taking differently: Take 70 mg by mouth every Saturday. Take with a full glass of water on an empty stomach.)  . citalopram (CELEXA) 10 MG tablet Take 1 tablet (10 mg total) by mouth daily.  Marland Kitchen ezetimibe (ZETIA) 10 MG tablet Take 1 tablet (10 mg total) by mouth daily.  . isosorbide mononitrate (IMDUR) 30 MG 24 hr tablet Take 1 tablet (30 mg total) by mouth daily.  . memantine (NAMENDA) 10 MG tablet Take 1 tablet (10 mg total) by mouth 2 (two) times daily.  . metoprolol succinate (TOPROL-XL) 25 MG 24 hr tablet Take 0.5 tablets (12.5 mg total) by mouth daily.  Marland Kitchen zolpidem (AMBIEN) 5 MG tablet Take 1 tablet (5 mg total) by mouth at bedtime as needed for sleep.     Allergies:   Clopidogrel and Methergine [methylergonovine maleate]   Social History   Tobacco Use  . Smoking status: Former Smoker    Types: Cigarettes  . Smokeless tobacco: Never Used  Vaping Use  .  Vaping Use: Never used  Substance Use Topics  . Alcohol use: No  . Drug use: No     Family Hx: The patient's family history includes Hyperlipidemia in her mother; Hypertension in her father.  ROS   EKGs/Labs/Other Test Reviewed:    EKG:  EKG is not ordered today.  The ekg ordered today demonstrates n/a  Recent Labs: 05/09/2020: ALT 15 05/14/2020: B Natriuretic Peptide 436.0 08/06/2020: BUN 20; Creatinine, Ser 1.05; Hemoglobin 14.0; Platelets 417; Potassium 3.9; Sodium 139   Recent Lipid Panel Lab Results  Component Value Date/Time   CHOL 231 (H) 05/10/2020 10:34 AM   CHOL 253 (H) 11/13/2017 08:12 AM   TRIG 106 05/10/2020 10:34 AM   HDL  71 05/10/2020 10:34 AM   HDL 84 11/13/2017 08:12 AM   CHOLHDL 3.3 05/10/2020 10:34 AM   LDLCALC 139 (H) 05/10/2020 10:34 AM   LDLCALC 157 (H) 11/13/2017 08:12 AM      Risk Assessment/Calculations:      Physical Exam:    VS:  BP 122/68   Pulse 84   Ht 5\' 6"  (1.676 m)   Wt 118 lb (53.5 kg)   SpO2 97%   BMI 19.05 kg/m     Wt Readings from Last 3 Encounters:  09/05/20 118 lb (53.5 kg)  08/27/20 113 lb 12.1 oz (51.6 kg)  08/06/20 113 lb 11.2 oz (51.6 kg)     Constitutional:      Appearance: Healthy appearance. Not in distress.  Pulmonary:     Effort: Pulmonary effort is normal.     Breath sounds: No wheezing. No rales.  Cardiovascular:     Normal rate. Regular rhythm. Normal S1. Normal S2.     Murmurs: There is no murmur.  Edema:    Peripheral edema absent.  Abdominal:     Palpations: Abdomen is soft.  Musculoskeletal:     Cervical back: Neck supple. Skin:    General: Skin is warm and dry.  Neurological:     Mental Status: Alert and oriented to person, place and time.     Cranial Nerves: Cranial nerves are intact.       ASSESSMENT & PLAN:    1. Takotsubo cardiomyopathy EF was 30-35 when she presented with her non-STEMI in 10/21.  Her EF has completely recovered to normal.  GDMT was limited by low blood pressure.  Now that her ejection fraction is normal, continue her current therapy with isosorbide, metoprolol succinate.  Continue cardiac rehabilitation.  She will follow up with her orthopedist to see if she can resume aspirin 81 mg daily.  Continue ezetimibe.  Follow-up in 6 months in the [place] office.  2. Essential hypertension The patient's blood pressure is controlled on her current regimen.  Continue current therapy.   3. Hyperlipidemia LDL goal <100 Continue ezetimibe.  Lipids are managed by primary care.         Dispo:  Return in about 6 months (around 03/05/2021) for Routine Follow Up.   Medication Adjustments/Labs and Tests Ordered: Current  medicines are reviewed at length with the patient today.  Concerns regarding medicines are outlined above.  Tests Ordered: No orders of the defined types were placed in this encounter.  Medication Changes: No orders of the defined types were placed in this encounter.   Signed, Richardson Dopp, PA-C  09/05/2020 4:56 PM    [facility] [address], [place], [place]  [address] Phone: [phone]; Fax: [phone]

## 2020-09-05 ENCOUNTER — Encounter: Payer: Self-pay | Admitting: Physician Assistant

## 2020-09-05 ENCOUNTER — Encounter (HOSPITAL_COMMUNITY): Payer: Medicare Other

## 2020-09-05 ENCOUNTER — Encounter (HOSPITAL_COMMUNITY)
Admission: RE | Admit: 2020-09-05 | Discharge: 2020-09-05 | Disposition: A | Discharge status: Home / Self Care | Payer: Medicare Other | Source: Ambulatory Visit | Attending: Cardiology | Admitting: Cardiology

## 2020-09-05 ENCOUNTER — Other Ambulatory Visit: Payer: Self-pay

## 2020-09-05 ENCOUNTER — Ambulatory Visit (INDEPENDENT_AMBULATORY_CARE_PROVIDER_SITE_OTHER): Payer: Medicare Other | Admitting: Physician Assistant

## 2020-09-05 VITALS — BP 122/68 | HR 84 | Ht 66.0 in | Wt 118.0 lb

## 2020-09-05 DIAGNOSIS — I5181 Takotsubo syndrome: Secondary | ICD-10-CM

## 2020-09-05 DIAGNOSIS — E785 Hyperlipidemia, unspecified: Secondary | ICD-10-CM

## 2020-09-05 DIAGNOSIS — I10 Essential (primary) hypertension: Secondary | ICD-10-CM | POA: Diagnosis not present

## 2020-09-05 DIAGNOSIS — I214 Non-ST elevation (NSTEMI) myocardial infarction: Secondary | ICD-10-CM | POA: Diagnosis not present

## 2020-09-05 NOTE — Progress Notes (Signed)
Daily Session Note  Patient Details  Name: Jeanne Rogers MRN: [id number] Date of Birth: 07-17-1941 Referring Provider:   Flowsheet Row CARDIAC REHAB PHASE II ORIENTATION from 07/05/2020 in [place]  Referring Provider Dr. Harl Bowie      Encounter Date: 09/05/2020  Check In:  Session Check In - 09/05/20 [place]      Check-In   Supervising physician immediately available to respond to emergencies [facility] MD immediately available    Physician(s) Domenic Polite    Location [facility]    Staff Present Cathren Harsh, MS, Exercise Physiologist;[name], RN, BSN    Virtual Visit No    Medication changes reported     No    Fall or balance concerns reported    No    Tobacco Cessation No Change    Warm-up and Cool-down Performed as group-led instruction    Resistance Training Performed Yes    VAD Patient? No    PAD/SET Patient? No      Pain Assessment   Currently in Pain? No/denies    Pain Score 0-No pain    Multiple Pain Sites No           Capillary Blood Glucose: No results found for this or any previous visit (from the past 24 hour(s)).    Social History   Tobacco Use  Smoking Status Former Smoker  . Types: Cigarettes  Smokeless Tobacco Never Used    Goals Met:  Independence with exercise equipment Exercise tolerated well No report of cardiac concerns or symptoms Strength training completed today  Goals Unmet:  Not Applicable  Comments: check out 1545   Dr. Kathie Dike is Medical Director for [facility] Pulmonary Rehab.

## 2020-09-05 NOTE — Patient Instructions (Signed)
Medication Instructions:  *If you need a refill on your cardiac medications before your next appointment, please call your pharmacy*  Follow-Up: At [facility], you and your health needs are our priority.  As part of our continuing mission to provide you with exceptional heart care, we have created designated Provider Care Teams.  These Care Teams include your primary Cardiologist (physician) and Advanced Practice Providers (APPs -  Physician Assistants and Nurse Practitioners) who all work together to provide you with the care you need, when you need it.  We recommend signing up for the patient portal called "MyChart".  Sign up information is provided on this After Visit Summary.  MyChart is used to connect with patients for Virtual Visits (Telemedicine).  Patients are able to view lab/test results, encounter notes, upcoming appointments, etc.  Non-urgent messages can be sent to your provider as well.   To learn more about what you can do with MyChart, go to [email or web address].    Your next appointment:   Your physician recommends that you schedule a follow-up appointment in: 6 MONTHS with Dr. Domenic Polite or an APP  The format for your next appointment:   In Person. You may see Dr. Domenic Polite or one of the following Advanced Practice Providers on your designated Care Team:    Bernerd Pho, PA-C   Ermalinda Barrios, PA-C   -- Please ask your orthopedic doctor if you can resume aspirin when you have your next follow up appointment --

## 2020-09-07 ENCOUNTER — Encounter (HOSPITAL_COMMUNITY)
Admission: RE | Admit: 2020-09-07 | Discharge: 2020-09-07 | Disposition: A | Discharge status: Home / Self Care | Payer: Medicare Other | Source: Ambulatory Visit | Attending: Cardiology | Admitting: Cardiology

## 2020-09-07 ENCOUNTER — Other Ambulatory Visit: Payer: Self-pay

## 2020-09-07 ENCOUNTER — Encounter (HOSPITAL_COMMUNITY): Payer: Medicare Other

## 2020-09-07 DIAGNOSIS — I214 Non-ST elevation (NSTEMI) myocardial infarction: Secondary | ICD-10-CM

## 2020-09-07 NOTE — Progress Notes (Signed)
Daily Session Note  Patient Details  Name: Jeanne Rogers MRN: [id number] Date of Birth: [birth year] Referring Provider:   Flowsheet Row CARDIAC REHAB PHASE II ORIENTATION from 07/05/2020 in [place]  Referring Provider Dr. Harl Bowie      Encounter Date: 09/07/2020  Check In:  Session Check In - 09/07/20 1445      Check-In   Physician(s) Branch    Location [facility]    Staff Present Cathren Harsh, MS, Exercise Physiologist;[name], RN, BSN    Virtual Visit No    Medication changes reported     No    Fall or balance concerns reported    No    Tobacco Cessation No Change    Warm-up and Cool-down Performed as group-led instruction    Resistance Training Performed Yes    VAD Patient? No    PAD/SET Patient? No      Pain Assessment   Currently in Pain? No/denies    Pain Score 0-No pain    Multiple Pain Sites No           Capillary Blood Glucose: No results found for this or any previous visit (from the past 24 hour(s)).    Social History   Tobacco Use  Smoking Status Former Smoker  . Types: Cigarettes  Smokeless Tobacco Never Used    Goals Met:  Independence with exercise equipment Personal goals reviewed No report of cardiac concerns or symptoms Strength training completed today  Goals Unmet:  Not Applicable  Comments: check out 1545   Dr. Kathie Dike is Medical Director for [facility] Pulmonary Rehab.

## 2020-09-10 ENCOUNTER — Other Ambulatory Visit: Payer: Self-pay

## 2020-09-10 ENCOUNTER — Encounter (HOSPITAL_COMMUNITY)
Admission: RE | Admit: 2020-09-10 | Discharge: 2020-09-10 | Disposition: A | Discharge status: Home / Self Care | Payer: Medicare Other | Source: Ambulatory Visit | Attending: Cardiology | Admitting: Cardiology

## 2020-09-10 VITALS — Wt 118.8 lb

## 2020-09-10 DIAGNOSIS — I214 Non-ST elevation (NSTEMI) myocardial infarction: Secondary | ICD-10-CM

## 2020-09-10 NOTE — Progress Notes (Signed)
Daily Session Note  Patient Details  Name: Jeanne Rogers MRN: [id number] Date of Birth: [birth year] Referring Provider:   Flowsheet Row CARDIAC REHAB PHASE II ORIENTATION from 07/05/2020 in [name]  Referring Provider Dr. Harl Bowie      Encounter Date: 09/10/2020  Check In:  Session Check In - 09/10/20 1445      Check-In   Supervising physician immediately available to respond to emergencies [facility] MD immediately available    Physician(s) Dr. Harl Bowie    Location [facility]    Staff Present Cathren Harsh, MS, Exercise Physiologist;[name], RN, BSN    Virtual Visit No    Medication changes reported     No    Fall or balance concerns reported    No    Tobacco Cessation No Change    Warm-up and Cool-down Performed as group-led instruction    Resistance Training Performed Yes    VAD Patient? No    PAD/SET Patient? No      Pain Assessment   Currently in Pain? No/denies    Pain Score 0-No pain    Multiple Pain Sites No           Capillary Blood Glucose: No results found for this or any previous visit (from the past 24 hour(s)).    Social History   Tobacco Use  Smoking Status Former Smoker  . Types: Cigarettes  Smokeless Tobacco Never Used    Goals Met:  Independence with exercise equipment Exercise tolerated well No report of cardiac concerns or symptoms Strength training completed today  Goals Unmet:  Not Applicable  Comments: check out 1545   Dr. Kathie Dike is Medical Director for [facility] Pulmonary Rehab.

## 2020-09-12 ENCOUNTER — Other Ambulatory Visit: Payer: Self-pay

## 2020-09-12 ENCOUNTER — Encounter (HOSPITAL_COMMUNITY)
Admission: RE | Admit: 2020-09-12 | Discharge: 2020-09-12 | Disposition: A | Discharge status: Home / Self Care | Payer: Medicare Other | Source: Ambulatory Visit | Attending: Cardiology | Admitting: Cardiology

## 2020-09-12 DIAGNOSIS — I214 Non-ST elevation (NSTEMI) myocardial infarction: Secondary | ICD-10-CM | POA: Diagnosis not present

## 2020-09-12 NOTE — Progress Notes (Signed)
Daily Session Note  Patient Details  Name: Jeanne Rogers MRN: [id number] Date of Birth: Sep 05, 1940 Referring Provider:   Flowsheet Row CARDIAC REHAB PHASE II ORIENTATION from 07/05/2020 in [place]  Referring Provider Dr. Harl Bowie      Encounter Date: 09/12/2020  Check In:  Session Check In - 09/12/20 1445      Check-In   Supervising physician immediately available to respond to emergencies [facility] MD immediately available    Physician(s) Dr. Harl Bowie    Location [facility]    Staff Present Cathren Harsh, MS, Exercise Physiologist;[name], RN, BSN    Virtual Visit No    Medication changes reported     No    Fall or balance concerns reported    No    Tobacco Cessation No Change    Warm-up and Cool-down Performed as group-led instruction    Resistance Training Performed Yes    VAD Patient? No    PAD/SET Patient? No      Pain Assessment   Currently in Pain? No/denies    Pain Score 3     Pain Location Back    Pain Orientation Mid    Pain Descriptors / Indicators Shooting;Sharp    Multiple Pain Sites No           Capillary Blood Glucose: No results found for this or any previous visit (from the past 24 hour(s)).    Social History   Tobacco Use  Smoking Status Former Smoker  . Types: Cigarettes  Smokeless Tobacco Never Used    Goals Met:  Independence with exercise equipment Exercise tolerated well No report of cardiac concerns or symptoms Strength training completed today  Goals Unmet:  Not Applicable  Comments: check out 1545   Dr. Kathie Dike is Medical Director for [facility] Pulmonary Rehab.

## 2020-09-12 NOTE — Progress Notes (Signed)
Cardiac Individual Treatment Plan  Patient Details  Name: Jeanne Rogers MRN: [id number] Date of Birth: 1941/02/22 Referring Provider:   Flowsheet Row CARDIAC REHAB PHASE II ORIENTATION from 07/05/2020 in [place]  Referring Provider Dr. Harl Bowie      Initial Encounter Date:  Flowsheet Row CARDIAC REHAB PHASE II ORIENTATION from 07/05/2020 in [place]  Date 07/05/20      Visit Diagnosis: NSTEMI (non-ST elevated myocardial infarction) [facility])  Patient's Home Medications on Admission:  Current Outpatient Medications:  .  acetaminophen (TYLENOL) 500 MG tablet, Take 1,000 mg by mouth every 6 (six) hours as needed for moderate pain., Disp: , Rfl:  .  alendronate (FOSAMAX) 70 MG tablet, Take 1 tablet (70 mg total) by mouth once a week. Take with a full glass of water on an empty stomach. (Patient taking differently: Take 70 mg by mouth every Saturday. Take with a full glass of water on an empty stomach.), Disp: 12 tablet, Rfl: 3 .  citalopram (CELEXA) 10 MG tablet, Take 1 tablet (10 mg total) by mouth daily., Disp: 90 tablet, Rfl: 3 .  ezetimibe (ZETIA) 10 MG tablet, Take 1 tablet (10 mg total) by mouth daily., Disp: 90 tablet, Rfl: 1 .  isosorbide mononitrate (IMDUR) 30 MG 24 hr tablet, Take 1 tablet (30 mg total) by mouth daily., Disp: 30 tablet, Rfl: 3 .  memantine (NAMENDA) 10 MG tablet, Take 1 tablet (10 mg total) by mouth 2 (two) times daily., Disp: 60 tablet, Rfl: 11 .  metoprolol succinate (TOPROL-XL) 25 MG 24 hr tablet, Take 0.5 tablets (12.5 mg total) by mouth daily., Disp: 90 tablet, Rfl: 3 .  zolpidem (AMBIEN) 5 MG tablet, Take 1 tablet (5 mg total) by mouth at bedtime as needed for sleep., Disp: 30 tablet, Rfl: 1  Past Medical History: Past Medical History:  Diagnosis Date  . Anemia   . Collapse, lung   . Complication of anesthesia    very sensitive to anesthesia  . GERD (gastroesophageal reflux disease)   . Hyperlipidemia    . Hypertension   . Osteoporosis   . PONV (postoperative nausea and vomiting)   . PUD (peptic ulcer disease)   . Spontaneous pneumothorax     Tobacco Use: Social History   Tobacco Use  Smoking Status Former Smoker  . Types: Cigarettes  Smokeless Tobacco Never Used    Labs: Recent Review Flowsheet Data    Labs for ITP Cardiac and Pulmonary Rehab Latest Ref Rng & Units 10/18/2014 07/21/2016 11/13/2017 08/25/2019 05/10/2020   Cholestrol 0 - 200 mg/dL 248(H) 251(H) 253(H) 240(H) 231(H)   LDLCALC 0 - 99 mg/dL 164(H) 156(H) 157(H) 150(H) 139(H)   HDL >40 mg/dL 71 77 84 74 71   Trlycerides <150 mg/dL 65 88 60 82 106   Hemoglobin A1c 4.8 - 5.6 % - - - 5.2 5.4      Capillary Blood Glucose: Lab Results  Component Value Date   GLUCAP 103 (H) 08/25/2019     Exercise Target Goals: Exercise Program Goal: Individual exercise prescription set using results from initial 6 min walk test and THRR while considering  patient's activity barriers and safety.   Exercise Prescription Goal: Starting with aerobic activity 30 plus minutes a day, 3 days per week for initial exercise prescription. Provide home exercise prescription and guidelines that participant acknowledges understanding prior to discharge.  Activity Barriers & Risk Stratification:  Activity Barriers & Cardiac Risk Stratification - 07/17/20 1208  Activity Barriers & Cardiac Risk Stratification   Activity Barriers Back Problems;Deconditioning;Muscular Weakness;Balance Concerns    Cardiac Risk Stratification High           6 Minute Walk:  6 Minute Walk    Row Name 07/05/20 1000         6 Minute Walk   Phase Initial     Distance 1250 feet     Walk Time 6 minutes     # of Rest Breaks 0     MPH 2.4     METS 2.7     RPE 8     VO2 Peak 9.56     Symptoms No     Resting HR 67 bpm     Resting BP 130/50     Resting Oxygen Saturation  95 %     Exercise Oxygen Saturation  during 6 min walk 95 %     Max Ex. HR 86 bpm      Max Ex. BP 152/60     2 Minute Post BP 130/58            Oxygen Initial Assessment:   Oxygen Re-Evaluation:   Oxygen Discharge (Final Oxygen Re-Evaluation):   Initial Exercise Prescription:  Initial Exercise Prescription - 07/05/20 1000      Date of Initial Exercise RX and Referring Provider   Date 07/05/20    Referring Provider Dr. Harl Bowie    Expected Discharge Date 09/27/20      NuStep   Level 1    SPM 60    Minutes 22      Arm Ergometer   Level 1    RPM 60    Minutes 17      Prescription Details   Duration Progress to 30 minutes of continuous aerobic without signs/symptoms of physical distress      Intensity   THRR 40-80% of Max Heartrate 57-114    Ratings of Perceived Exertion 11-13      Progression   Progression Continue progressive overload as per policy without signs/symptoms or physical distress.      Resistance Training   Training Prescription Yes    Weight 2    Reps 10-15           Perform Capillary Blood Glucose checks as needed.  Exercise Prescription Changes:   Exercise Prescription Changes    Row Name 07/16/20 1545 07/18/20 1445 08/27/20 1500 09/11/20 0800       Response to Exercise   Blood Pressure (Admit) 152/70 146/78 140/60 128/72    Blood Pressure (Exercise) 154/70 150/72 166/64 158/74    Blood Pressure (Exit) 122/80 132/70 132/64 136/80    Heart Rate (Admit) 75 bpm 81 bpm 88 bpm 99 bpm    Heart Rate (Exercise) 102 bpm 113 bpm 107 bpm 106 bpm    Heart Rate (Exit) 84 bpm 88 bpm 98 bpm 102 bpm    Rating of Perceived Exertion (Exercise) 12 13 12 11     Duration Continue with 30 min of aerobic exercise without signs/symptoms of physical distress. Continue with 30 min of aerobic exercise without signs/symptoms of physical distress. Continue with 30 min of aerobic exercise without signs/symptoms of physical distress. Continue with 30 min of aerobic exercise without signs/symptoms of physical distress.    Intensity THRR unchanged THRR  unchanged THRR unchanged THRR unchanged         Progression   Progression - Continue to progress workloads to maintain intensity without signs/symptoms of physical distress. Continue to progress  workloads to maintain intensity without signs/symptoms of physical distress. Continue to progress workloads to maintain intensity without signs/symptoms of physical distress.         Resistance Training   Training Prescription Yes Yes Yes Yes    Weight 1 lb 1 lbs 1 lb 1 lb    Reps 10-15 10-15 10-15 10-15    Time 10 Minutes 10 Minutes 10 Minutes 10 Minutes         NuStep   Level 1 1 1 1     SPM 60 64 49 58    Minutes 22 22 22 22     METs 1.7 1.8 1.7 1.7         Arm Ergometer   Level 1 1 1 1     RPM 48 50 43 44    Minutes 17 17 17 17     METs 2 2.1 2 2            Exercise Comments:   Exercise Comments    Row Name 07/09/20 1543           Exercise Comments Pt completed first exercise session. She tolerated exercise well and has no complaints.              Exercise Goals and Review:   Exercise Goals    Row Name 07/05/20 1004 07/16/20 1545 08/14/20 0759 09/11/20 0820       Exercise Goals   Increase Physical Activity Yes Yes Yes Yes    Intervention Provide advice, education, support and counseling about physical activity/exercise needs.;Develop an individualized exercise prescription for aerobic and resistive training based on initial evaluation findings, risk stratification, comorbidities and participant's personal goals. Provide advice, education, support and counseling about physical activity/exercise needs.;Develop an individualized exercise prescription for aerobic and resistive training based on initial evaluation findings, risk stratification, comorbidities and participant's personal goals. Provide advice, education, support and counseling about physical activity/exercise needs.;Develop an individualized exercise prescription for aerobic and resistive training based on initial  evaluation findings, risk stratification, comorbidities and participant's personal goals. Provide advice, education, support and counseling about physical activity/exercise needs.;Develop an individualized exercise prescription for aerobic and resistive training based on initial evaluation findings, risk stratification, comorbidities and participant's personal goals.    Expected Outcomes Short Term: Attend rehab on a regular basis to increase amount of physical activity.;Long Term: Add in home exercise to make exercise part of routine and to increase amount of physical activity.;Long Term: Exercising regularly at least 3-5 days a week. Short Term: Attend rehab on a regular basis to increase amount of physical activity.;Long Term: Add in home exercise to make exercise part of routine and to increase amount of physical activity.;Long Term: Exercising regularly at least 3-5 days a week. Short Term: Attend rehab on a regular basis to increase amount of physical activity.;Long Term: Add in home exercise to make exercise part of routine and to increase amount of physical activity.;Long Term: Exercising regularly at least 3-5 days a week. Short Term: Attend rehab on a regular basis to increase amount of physical activity.;Long Term: Add in home exercise to make exercise part of routine and to increase amount of physical activity.;Long Term: Exercising regularly at least 3-5 days a week.    Increase Strength and Stamina Yes Yes Yes Yes    Intervention Provide advice, education, support and counseling about physical activity/exercise needs.;Develop an individualized exercise prescription for aerobic and resistive training based on initial evaluation findings, risk stratification, comorbidities and participant's personal goals. Provide advice, education, support and  counseling about physical activity/exercise needs.;Develop an individualized exercise prescription for aerobic and resistive training based on initial  evaluation findings, risk stratification, comorbidities and participant's personal goals. Provide advice, education, support and counseling about physical activity/exercise needs.;Develop an individualized exercise prescription for aerobic and resistive training based on initial evaluation findings, risk stratification, comorbidities and participant's personal goals. Provide advice, education, support and counseling about physical activity/exercise needs.;Develop an individualized exercise prescription for aerobic and resistive training based on initial evaluation findings, risk stratification, comorbidities and participant's personal goals.    Expected Outcomes Short Term: Increase workloads from initial exercise prescription for resistance, speed, and METs.;Short Term: Perform resistance training exercises routinely during rehab and add in resistance training at home;Long Term: Improve cardiorespiratory fitness, muscular endurance and strength as measured by increased METs and functional capacity (6MWT) Short Term: Increase workloads from initial exercise prescription for resistance, speed, and METs.;Short Term: Perform resistance training exercises routinely during rehab and add in resistance training at home;Long Term: Improve cardiorespiratory fitness, muscular endurance and strength as measured by increased METs and functional capacity (6MWT) Short Term: Increase workloads from initial exercise prescription for resistance, speed, and METs.;Short Term: Perform resistance training exercises routinely during rehab and add in resistance training at home;Long Term: Improve cardiorespiratory fitness, muscular endurance and strength as measured by increased METs and functional capacity (6MWT) Short Term: Increase workloads from initial exercise prescription for resistance, speed, and METs.;Short Term: Perform resistance training exercises routinely during rehab and add in resistance training at home;Long Term:  Improve cardiorespiratory fitness, muscular endurance and strength as measured by increased METs and functional capacity (6MWT)    Able to understand and use rate of perceived exertion (RPE) scale Yes Yes Yes Yes    Intervention Provide education and explanation on how to use RPE scale Provide education and explanation on how to use RPE scale Provide education and explanation on how to use RPE scale Provide education and explanation on how to use RPE scale    Expected Outcomes Short Term: Able to use RPE daily in rehab to express subjective intensity level;Long Term:  Able to use RPE to guide intensity level when exercising independently Short Term: Able to use RPE daily in rehab to express subjective intensity level;Long Term:  Able to use RPE to guide intensity level when exercising independently Short Term: Able to use RPE daily in rehab to express subjective intensity level;Long Term:  Able to use RPE to guide intensity level when exercising independently Short Term: Able to use RPE daily in rehab to express subjective intensity level;Long Term:  Able to use RPE to guide intensity level when exercising independently    Knowledge and understanding of Target Heart Rate Range (THRR) Yes Yes Yes Yes    Intervention Provide education and explanation of THRR including how the numbers were predicted and where they are located for reference Provide education and explanation of THRR including how the numbers were predicted and where they are located for reference Provide education and explanation of THRR including how the numbers were predicted and where they are located for reference Provide education and explanation of THRR including how the numbers were predicted and where they are located for reference    Expected Outcomes Short Term: Able to state/look up THRR;Long Term: Able to use THRR to govern intensity when exercising independently;Short Term: Able to use daily as guideline for intensity in rehab Short  Term: Able to state/look up THRR;Long Term: Able to use THRR to govern intensity when exercising independently;Short Term:  Able to use daily as guideline for intensity in rehab Short Term: Able to state/look up THRR;Long Term: Able to use THRR to govern intensity when exercising independently;Short Term: Able to use daily as guideline for intensity in rehab Short Term: Able to state/look up THRR;Long Term: Able to use THRR to govern intensity when exercising independently;Short Term: Able to use daily as guideline for intensity in rehab    Able to check pulse independently Yes Yes Yes Yes    Intervention Provide education and demonstration on how to check pulse in carotid and radial arteries.;Review the importance of being able to check your own pulse for safety during independent exercise Provide education and demonstration on how to check pulse in carotid and radial arteries.;Review the importance of being able to check your own pulse for safety during independent exercise Provide education and demonstration on how to check pulse in carotid and radial arteries.;Review the importance of being able to check your own pulse for safety during independent exercise Provide education and demonstration on how to check pulse in carotid and radial arteries.;Review the importance of being able to check your own pulse for safety during independent exercise    Expected Outcomes Short Term: Able to explain why pulse checking is important during independent exercise;Long Term: Able to check pulse independently and accurately Short Term: Able to explain why pulse checking is important during independent exercise;Long Term: Able to check pulse independently and accurately Short Term: Able to explain why pulse checking is important during independent exercise;Long Term: Able to check pulse independently and accurately Short Term: Able to explain why pulse checking is important during independent exercise;Long Term: Able to check  pulse independently and accurately    Understanding of Exercise Prescription Yes Yes Yes Yes    Intervention Provide education, explanation, and written materials on patient's individual exercise prescription Provide education, explanation, and written materials on patient's individual exercise prescription Provide education, explanation, and written materials on patient's individual exercise prescription Provide education, explanation, and written materials on patient's individual exercise prescription    Expected Outcomes Short Term: Able to explain program exercise prescription;Long Term: Able to explain home exercise prescription to exercise independently Short Term: Able to explain program exercise prescription;Long Term: Able to explain home exercise prescription to exercise independently Short Term: Able to explain program exercise prescription;Long Term: Able to explain home exercise prescription to exercise independently Short Term: Able to explain program exercise prescription;Long Term: Able to explain home exercise prescription to exercise independently           Exercise Goals Re-Evaluation :  Exercise Goals Re-Evaluation    Row Name 07/16/20 1545 08/14/20 0759 09/11/20 0821         Exercise Goal Re-Evaluation   Exercise Goals Review Increase Physical Activity;Increase Strength and Stamina;Able to understand and use rate of perceived exertion (RPE) scale;Knowledge and understanding of Target Heart Rate Range (THRR);Able to check pulse independently;Understanding of Exercise Prescription Increase Physical Activity;Increase Strength and Stamina;Able to understand and use rate of perceived exertion (RPE) scale;Knowledge and understanding of Target Heart Rate Range (THRR);Able to check pulse independently;Understanding of Exercise Prescription Increase Physical Activity;Increase Strength and Stamina;Able to understand and use rate of perceived exertion (RPE) scale;Knowledge and understanding  of Target Heart Rate Range (THRR);Able to check pulse independently;Understanding of Exercise Prescription     Comments Patient has completed 4 exercise session. She has tolerated exercise well and has been progressing well. She is currently exercising at 1.7 METs on the NuStep. Will continue to monitor  and progress exercise as able. Pt has completed 5 exercise sessions. She has been absent since 07/18/2020 due to compression fractures in her back. She recently underwent a kyphoplasty surgery and it is unclear if/when she will return to rehab. We will follow up with her again soon to see if she will be able to to return to rehab. Will monitor and progress as able. Patient had completed 12 exercise sessions. She has been back exercising for about 2 weeks. She is getting back into exercising and has not seemed to progress much. We plan on increasing intensities on her next exercise session. She is currently exercising at 1.7 METs on the Nu Step.     Expected Outcomes Through exercise at rehab and a home exercise program, patient will reach their goals. Through exercise at rehab and a home exercise program, patient will reach their goals. Through exercise at rehab and a home exercise program, patient will reach their goals.             Discharge Exercise Prescription (Final Exercise Prescription Changes):  Exercise Prescription Changes - 09/11/20 0800      Response to Exercise   Blood Pressure (Admit) 128/72    Blood Pressure (Exercise) 158/74    Blood Pressure (Exit) 136/80    Heart Rate (Admit) 99 bpm    Heart Rate (Exercise) 106 bpm    Heart Rate (Exit) 102 bpm    Rating of Perceived Exertion (Exercise) 11    Duration Continue with 30 min of aerobic exercise without signs/symptoms of physical distress.    Intensity THRR unchanged      Progression   Progression Continue to progress workloads to maintain intensity without signs/symptoms of physical distress.      Resistance Training    Training Prescription Yes    Weight 1 lb    Reps 10-15    Time 10 Minutes      NuStep   Level 1    SPM 58    Minutes 22    METs 1.7      Arm Ergometer   Level 1    RPM 44    Minutes 17    METs 2           Nutrition:  Target Goals: Understanding of nutrition guidelines, daily intake of sodium 1500mg , cholesterol 200mg , calories 30% from fat and 7% or less from saturated fats, daily to have 5 or more servings of fruits and vegetables.  Biometrics:  Pre Biometrics - 09/11/20 0831      Pre Biometrics   Weight 53.9 kg    BMI (Calculated) 19.19            Nutrition Therapy Plan and Nutrition Goals:  Nutrition Therapy & Goals - 09/05/20 0813      Personal Nutrition Goals   Comments We will continue to provide heart healthy nutritional education through hand-outs.      Intervention Plan   Intervention Nutrition handout(s) given to patient.           Nutrition Assessments:  Nutrition Assessments - 07/05/20 0958      MEDFICTS Scores   Pre Score 22          MEDIFICTS Score Key:  ?70 Need to make dietary changes   40-70 Heart Healthy Diet  ? 40 Therapeutic Level Cholesterol Diet   Picture Your Plate Scores:  <92 Unhealthy dietary pattern with much room for improvement.  41-50 Dietary pattern unlikely to meet recommendations for good health and room  for improvement.  51-60 More healthful dietary pattern, with some room for improvement.   >60 Healthy dietary pattern, although there may be some specific behaviors that could be improved.    Nutrition Goals Re-Evaluation:   Nutrition Goals Discharge (Final Nutrition Goals Re-Evaluation):   Psychosocial: Target Goals: Acknowledge presence or absence of significant depression and/or stress, maximize coping skills, provide positive support system. Participant is able to verbalize types and ability to use techniques and skills needed for reducing stress and depression.  Initial Review & Psychosocial  Screening:  Initial Psych Review & Screening - 07/05/20 0943      Initial Review   Current issues with None Identified      Family Dynamics   Good Support System? Yes    Comments Patient lives with her husband of 6 years. She reports he and her children are her support system along with some friends. She is very active in her community and has a positive outlook. She denies any depression or anxiety. She was evaluated by a neurologist in June 2021 for mild memory loss and irritability. She was diagnosed with mild cognitive impairment and was started on Citalopram 10 mg daily to help wth her mood and irritability. She also takes Namenda 10 mg BID. Will continue to monitor.      Barriers   Psychosocial barriers to participate in program There are no identifiable barriers or psychosocial needs.      Screening Interventions   Interventions Encouraged to exercise    Expected Outcomes Short Term goal: Identification and review with participant of any Quality of Life or Depression concerns found by scoring the questionnaire.;Long Term goal: The participant improves quality of Life and PHQ9 Scores as seen by post scores and/or verbalization of changes           Quality of Life Scores:  Quality of Life - 07/05/20 0959      Quality of Life   Select Quality of Life      Quality of Life Scores   Health/Function Pre 26.62 %    Socioeconomic Pre 28.75 %    Psych/Spiritual Pre 26.5 %    Family Pre 28.8 %    GLOBAL Pre 27.38 %          Scores of 19 and below usually indicate a poorer quality of life in these areas.  A difference of  2-3 points is a clinically meaningful difference.  A difference of 2-3 points in the total score of the Quality of Life Index has been associated with significant improvement in overall quality of life, self-image, physical symptoms, and general health in studies assessing change in quality of life.  PHQ-9: Recent Review Flowsheet Data    Depression screen [facility]  2/9 07/05/2020 04/16/2020 11/09/2017 07/23/2016   Decreased Interest 0 0 0 0   Down, Depressed, Hopeless 0 1 0 0   PHQ - 2 Score 0 1 0 0   Altered sleeping 1 1 - -   Tired, decreased energy 1 0 - -   Change in appetite 0 0 - -   Feeling bad or failure about yourself  0 0 - -   Trouble concentrating 0 0 - -   Moving slowly or fidgety/restless 0 0 - -   Suicidal thoughts 0 0 - -   PHQ-9 Score 2 2 - -   Difficult doing work/chores Not difficult at all Not difficult at all - -     Interpretation of Total Score  Total Score  Depression Severity:  1-4 = Minimal depression, 5-9 = Mild depression, 10-14 = Moderate depression, 15-19 = Moderately severe depression, 20-27 = Severe depression   Psychosocial Evaluation and Intervention:  Psychosocial Evaluation - 07/05/20 0955      Psychosocial Evaluation & Interventions   Interventions Stress management education;Relaxation education;Encouraged to exercise with the program and follow exercise prescription    Comments Patient has no psychosocial barriers identified at her orientation visit. Her initial QOL socre was 27.38 overal. and her PHQ-9 score was 2. Will continue to monitor.    Expected Outcomes Patient will have no psychosocial issues identified at discharge.    Continue Psychosocial Services  No Follow up required           Psychosocial Re-Evaluation:  Psychosocial Re-Evaluation    [place] Name 07/13/20 1438 09/05/20 0813           Psychosocial Re-Evaluation   Current issues with None Identified None Identified      Comments Patient continue to have no psychosocial issues identified. She demonstrates a positive outlook regarding her future and seems to enjoy coming to the program. Will continue to monitor. Patient continue to have no psychosocial issues identified. She has completed 10 session having to miss several weeks due to having back surgery. She has returned ans demonstrates a positive outlook regarding her future and seems to  enjoy coming to the program. Will continue to monitor.      Expected Outcomes Patient will have no psychosocial issues identified at discharge. Patient will have no psychosocial issues identified at discharge.      Interventions Stress management education;Encouraged to attend Cardiac Rehabilitation for the exercise;Relaxation education Stress management education;Encouraged to attend Cardiac Rehabilitation for the exercise;Relaxation education      Continue Psychosocial Services  No Follow up required No Follow up required             Psychosocial Discharge (Final Psychosocial Re-Evaluation):  Psychosocial Re-Evaluation - 09/05/20 0813      Psychosocial Re-Evaluation   Current issues with None Identified    Comments Patient continue to have no psychosocial issues identified. She has completed 10 session having to miss several weeks due to having back surgery. She has returned ans demonstrates a positive outlook regarding her future and seems to enjoy coming to the program. Will continue to monitor.    Expected Outcomes Patient will have no psychosocial issues identified at discharge.    Interventions Stress management education;Encouraged to attend Cardiac Rehabilitation for the exercise;Relaxation education    Continue Psychosocial Services  No Follow up required           Vocational Rehabilitation: Provide vocational rehab assistance to qualifying candidates.   Vocational Rehab Evaluation & Intervention:  Vocational Rehab - 07/05/20 0959      Initial Vocational Rehab Evaluation & Intervention   Assessment shows need for Vocational Rehabilitation No      Vocational Rehab Re-Evaulation   Comments Patient is retired and is not interest in returning to work. She does not need vocational rehab.           Education: Education Goals: Education classes will be provided on a weekly basis, covering required topics. Participant will state understanding/return demonstration of topics  presented.  Learning Barriers/Preferences:  Learning Barriers/Preferences - 07/05/20 0938      Learning Barriers/Preferences   Learning Barriers None    Learning Preferences Video;Audio;Written Material           Education Topics: Hypertension, Hypertension Reduction -Define heart disease  and high blood pressure. Discus how high blood pressure affects the body and ways to reduce high blood pressure.   Exercise and Your Heart -Discuss why it is important to exercise, the FITT principles of exercise, normal and abnormal responses to exercise, and how to exercise safely.   Angina -Discuss definition of angina, causes of angina, treatment of angina, and how to decrease risk of having angina.   Cardiac Medications -Review what the following cardiac medications are used for, how they affect the body, and side effects that may occur when taking the medications.  Medications include Aspirin, Beta blockers, calcium channel blockers, ACE Inhibitors, angiotensin receptor blockers, diuretics, digoxin, and antihyperlipidemics. Flowsheet Row CARDIAC REHAB PHASE II EXERCISE from 09/05/2020 in [place]  Date 08/29/20  Educator [facility]  Instruction Review Code 2- Demonstrated Understanding      Congestive Heart Failure -Discuss the definition of CHF, how to live with CHF, the signs and symptoms of CHF, and how keep track of weight and sodium intake. Flowsheet Row CARDIAC REHAB PHASE II EXERCISE from 09/05/2020 in [place]  Date 09/05/20  Educator [facility]  Instruction Review Code 2- Demonstrated Understanding      Heart Disease and Intimacy -Discus the effect sexual activity has on the heart, how changes occur during intimacy as we age, and safety during sexual activity.   Smoking Cessation / COPD -Discuss different methods to quit smoking, the health benefits of quitting smoking, and the definition of COPD.   Nutrition I: Fats -Discuss the types  of cholesterol, what cholesterol does to the heart, and how cholesterol levels can be controlled.   Nutrition II: Labels -Discuss the different components of food labels and how to read food label   Heart Parts/Heart Disease and PAD -Discuss the anatomy of the heart, the pathway of blood circulation through the heart, and these are affected by heart disease.   Stress I: Signs and Symptoms -Discuss the causes of stress, how stress may lead to anxiety and depression, and ways to limit stress. Flowsheet Row CARDIAC REHAB PHASE II EXERCISE from 09/05/2020 in [place]  Date 07/18/20  Educator [facility]  Instruction Review Code 2- Demonstrated Understanding      Stress II: Relaxation -Discuss different types of relaxation techniques to limit stress.   Warning Signs of Stroke / TIA -Discuss definition of a stroke, what the signs and symptoms are of a stroke, and how to identify when someone is having stroke.   Knowledge Questionnaire Score:  Knowledge Questionnaire Score - 07/05/20 0958      Knowledge Questionnaire Score   Pre Score 16/24           Core Components/Risk Factors/Patient Goals at Admission:  Personal Goals and Risk Factors at Admission - 07/05/20 0959      Core Components/Risk Factors/Patient Goals on Admission    Weight Management Weight Loss    Personal Goal Other Yes    Personal Goal Get stronger. Get healthier. Be able to do her ADL's.    Intervention Patient will attend CR 3 days/week and supplement with exercise at home 2 days/week.    Expected Outcomes Patient will meet both personal and program goals.           Core Components/Risk Factors/Patient Goals Review:   Goals and Risk Factor Review    Row Name 07/13/20 1439 08/09/20 0732 09/05/20 0814         Core Components/Risk Factors/Patient Goals Review   Personal Goals Review  Other - Other     Review Patient is new to the program completing 3 sessions maintaining her weight  since her initial visit. She has multiple risk factors for CAD and is here for risk modification. Her personal goals are to get stronger; be albe to do her ADL's and gain confidence. Will continue to monitor for progress. Patient is currently not attending sessions due to compression fractures. She had a kyphoplasty procedure yesterday 08/08/20. Will continue to monitor if she will be able to return to the program. Paitent returned to the program 08/27/20 after being out with Kyphoplasty of T8 and T12 08/08/20. She has an echo 08/17/20 and her EF was normal at 65-70%. She is glad to be back in the program and is looking forward to coming. Her personal goals for the program are to get stronger; be able to do her ADL's and gain confidence in what she can do. We will continue to monitor her as she works towards these goals.     Expected Outcomes Patient will complete the program meeting both personal and program goals. Patient will complete the program meeting both personal and program goals. Patient will complete the program meeting both personal and program goals.            Core Components/Risk Factors/Patient Goals at Discharge (Final Review):   Goals and Risk Factor Review - 09/05/20 0814      Core Components/Risk Factors/Patient Goals Review   Personal Goals Review Other    Review Paitent returned to the program 08/27/20 after being out with Kyphoplasty of T8 and T12 08/08/20. She has an echo 08/17/20 and her EF was normal at 65-70%. She is glad to be back in the program and is looking forward to coming. Her personal goals for the program are to get stronger; be able to do her ADL's and gain confidence in what she can do. We will continue to monitor her as she works towards these goals.    Expected Outcomes Patient will complete the program meeting both personal and program goals.           ITP Comments:   Comments: ITP REVIEW Pt is making expected progress toward Cardiac Rehab goals after completing  12 sessions. Recommend continued exercise, life style modification, education, and increased stamina and strength.

## 2020-09-14 ENCOUNTER — Encounter (HOSPITAL_COMMUNITY)
Admission: RE | Admit: 2020-09-14 | Discharge: 2020-09-14 | Disposition: A | Discharge status: Home / Self Care | Payer: Medicare Other | Source: Ambulatory Visit | Attending: Cardiology | Admitting: Cardiology

## 2020-09-14 ENCOUNTER — Other Ambulatory Visit: Payer: Self-pay

## 2020-09-14 DIAGNOSIS — I214 Non-ST elevation (NSTEMI) myocardial infarction: Secondary | ICD-10-CM | POA: Diagnosis not present

## 2020-09-14 NOTE — Progress Notes (Signed)
Daily Session Note  Patient Details  Name: Jeanne Rogers MRN: [id number] Date of Birth: 1940-10-20 Referring Provider:   Flowsheet Row CARDIAC REHAB PHASE II ORIENTATION from 07/05/2020 in [place]  Referring Provider Dr. Harl Bowie      Encounter Date: 09/14/2020  Check In:  Session Check In - 09/14/20 1445      Check-In   Supervising physician immediately available to respond to emergencies [facility] MD immediately available    Physician(s) Dr. Johnsie Cancel    Location [facility]    Staff Present Cathren Harsh, MS, Exercise Physiologist;[name], RN, BSN;Phyllis Billingsley, RN;Caylynn Minchew, MS, ACSM-CEP, Exercise Physiologist    Virtual Visit No    Medication changes reported     No    Fall or balance concerns reported    No    Tobacco Cessation No Change    Warm-up and Cool-down Performed as group-led instruction    Resistance Training Performed Yes    VAD Patient? No    PAD/SET Patient? No      Pain Assessment   Currently in Pain? No/denies    Pain Score 0-No pain    Multiple Pain Sites No           Capillary Blood Glucose: No results found for this or any previous visit (from the past 24 hour(s)).    Social History   Tobacco Use  Smoking Status Former Smoker  . Types: Cigarettes  Smokeless Tobacco Never Used    Goals Met:  Independence with exercise equipment Exercise tolerated well No report of cardiac concerns or symptoms Strength training completed today  Goals Unmet:  Not Applicable  Comments: checkout time is 1545   Dr. Kathie Dike is Medical Director for [facility] Pulmonary Rehab.

## 2020-09-17 ENCOUNTER — Other Ambulatory Visit: Payer: Self-pay

## 2020-09-17 ENCOUNTER — Encounter (HOSPITAL_COMMUNITY)
Admission: RE | Admit: 2020-09-17 | Discharge: 2020-09-17 | Disposition: A | Discharge status: Home / Self Care | Payer: Medicare Other | Source: Ambulatory Visit | Attending: Cardiology | Admitting: Cardiology

## 2020-09-17 ENCOUNTER — Encounter (HOSPITAL_COMMUNITY): Payer: Medicare Other

## 2020-09-17 DIAGNOSIS — I214 Non-ST elevation (NSTEMI) myocardial infarction: Secondary | ICD-10-CM | POA: Diagnosis not present

## 2020-09-17 NOTE — Progress Notes (Signed)
Daily Session Note  Patient Details  Name: Jeanne Rogers MRN: [id number] Date of Birth: 1941-01-07 Referring Provider:   Flowsheet Row CARDIAC REHAB PHASE II ORIENTATION from 07/05/2020 in [place]  Referring Provider Dr. Harl Bowie      Encounter Date: 09/17/2020  Check In:  Session Check In - 09/17/20 1445      Check-In   Supervising physician immediately available to respond to emergencies [facility] MD immediately available    Physician(s) Dr. Johnsie Cancel    Location [facility]    Staff Present Cathren Harsh, MS, Exercise Physiologist;[name], RN, BSN    Virtual Visit No    Medication changes reported     No    Fall or balance concerns reported    No    Tobacco Cessation No Change    Warm-up and Cool-down Performed as group-led instruction    Resistance Training Performed Yes    VAD Patient? No    PAD/SET Patient? No      Pain Assessment   Currently in Pain? No/denies    Pain Score 0-No pain    Multiple Pain Sites No           Capillary Blood Glucose: No results found for this or any previous visit (from the past 24 hour(s)).    Social History   Tobacco Use  Smoking Status Former Smoker  . Types: Cigarettes  Smokeless Tobacco Never Used    Goals Met:  Independence with exercise equipment Exercise tolerated well No report of cardiac concerns or symptoms Strength training completed today  Goals Unmet:  Not Applicable  Comments: check out 1545   Dr. Kathie Dike is Medical Director for [facility] Pulmonary Rehab.

## 2020-09-19 ENCOUNTER — Other Ambulatory Visit: Payer: Self-pay

## 2020-09-19 ENCOUNTER — Encounter (HOSPITAL_COMMUNITY)
Admission: RE | Admit: 2020-09-19 | Discharge: 2020-09-19 | Disposition: A | Discharge status: Home / Self Care | Payer: Medicare Other | Source: Ambulatory Visit | Attending: Cardiology | Admitting: Cardiology

## 2020-09-19 DIAGNOSIS — I214 Non-ST elevation (NSTEMI) myocardial infarction: Secondary | ICD-10-CM

## 2020-09-19 NOTE — Progress Notes (Signed)
Daily Session Note  Patient Details  Name: Jeanne Rogers MRN: [id number] Date of Birth: 1940/08/25 Referring Provider:   Flowsheet Row CARDIAC REHAB PHASE II ORIENTATION from 07/05/2020 in [place]  Referring Provider Dr. Harl Bowie      Encounter Date: 09/19/2020  Check In:  Session Check In - 09/19/20 1445      Check-In   Supervising physician immediately available to respond to emergencies [facility] MD immediately available    Physician(s) Dr. Johnsie Cancel    Location [facility]    Staff Present Cathren Harsh, MS, Exercise Physiologist;[name], RN, Bjorn Loser, MS, ACSM-CEP, Exercise Physiologist    Virtual Visit No    Medication changes reported     No    Fall or balance concerns reported    No    Tobacco Cessation No Change    Warm-up and Cool-down Performed as group-led instruction    Resistance Training Performed Yes    VAD Patient? No    PAD/SET Patient? No      Pain Assessment   Currently in Pain? No/denies    Pain Score 0-No pain    Multiple Pain Sites No           Capillary Blood Glucose: No results found for this or any previous visit (from the past 24 hour(s)).    Social History   Tobacco Use  Smoking Status Former Smoker  . Types: Cigarettes  Smokeless Tobacco Never Used    Goals Met:  Independence with exercise equipment Exercise tolerated well No report of cardiac concerns or symptoms Strength training completed today  Goals Unmet:  Not Applicable  Comments: checkout time is 1545   Dr. Kathie Dike is Medical Director for [facility] Pulmonary Rehab.

## 2020-09-21 ENCOUNTER — Encounter (HOSPITAL_COMMUNITY): Payer: Medicare Other

## 2020-09-21 DIAGNOSIS — Z4889 Encounter for other specified surgical aftercare: Secondary | ICD-10-CM | POA: Diagnosis not present

## 2020-09-23 ENCOUNTER — Other Ambulatory Visit: Payer: Self-pay | Admitting: Family Medicine

## 2020-09-24 ENCOUNTER — Other Ambulatory Visit: Payer: Self-pay

## 2020-09-24 ENCOUNTER — Encounter (HOSPITAL_COMMUNITY)
Admission: RE | Admit: 2020-09-24 | Discharge: 2020-09-24 | Disposition: A | Discharge status: Home / Self Care | Payer: Medicare Other | Source: Ambulatory Visit | Attending: Cardiology | Admitting: Cardiology

## 2020-09-24 VITALS — Wt 117.9 lb

## 2020-09-24 DIAGNOSIS — I214 Non-ST elevation (NSTEMI) myocardial infarction: Secondary | ICD-10-CM

## 2020-09-24 NOTE — Progress Notes (Signed)
Daily Session Note  Patient Details  Name: Jeanne Rogers MRN: [id number] Date of Birth: 01/08/1941 Referring Provider:   Flowsheet Row CARDIAC REHAB PHASE II ORIENTATION from 07/05/2020 in [place] [facility]  Referring Provider Dr. Branch      Encounter Date: 09/24/2020  Check In:  Session Check In - 09/24/20 1445      Check-In   Supervising physician immediately available to respond to emergencies [facility] MD immediately available    Physician(s) Dr. Nishan    Location [facility]    Staff Present  , MS, Exercise Physiologist;[name], MS, ACSM-CEP, Exercise Physiologist    Virtual Visit No    Medication changes reported     No    Fall or balance concerns reported    No    Tobacco Cessation No Change    Warm-up and Cool-down Performed as group-led instruction    Resistance Training Performed Yes    VAD Patient? No    PAD/SET Patient? No      Pain Assessment   Currently in Pain? No/denies    Pain Score 0-No pain    Multiple Pain Sites No           Capillary Blood Glucose: No results found for this or any previous visit (from the past 24 hour(s)).    Social History   Tobacco Use  Smoking Status Former Smoker  . Types: Cigarettes  Smokeless Tobacco Never Used    Goals Met:  Independence with exercise equipment Exercise tolerated well No report of cardiac concerns or symptoms Strength training completed today  Goals Unmet:  Not Applicable  Comments: check out 1545   Dr. Jehanzeb Memon is Medical Director for [place] Pulmonary Re[facility]. 

## 2020-09-26 ENCOUNTER — Encounter (HOSPITAL_COMMUNITY)
Admission: RE | Admit: 2020-09-26 | Discharge: 2020-09-26 | Disposition: A | Discharge status: Home / Self Care | Payer: Medicare Other | Source: Ambulatory Visit | Attending: Cardiology | Admitting: Cardiology

## 2020-09-26 ENCOUNTER — Other Ambulatory Visit: Payer: Self-pay

## 2020-09-26 DIAGNOSIS — I214 Non-ST elevation (NSTEMI) myocardial infarction: Secondary | ICD-10-CM | POA: Diagnosis not present

## 2020-09-26 NOTE — Progress Notes (Signed)
Daily Session Note  Patient Details  Name: Jeanne Rogers MRN: [id number] Date of Birth: 1940-11-18 Referring Provider:   Flowsheet Row CARDIAC REHAB PHASE II ORIENTATION from 07/05/2020 in [place]  Referring Provider Dr. Harl Bowie      Encounter Date: 09/26/2020  Check In:  Session Check In - 09/26/20 1456      Check-In   Supervising physician immediately available to respond to emergencies [facility] MD immediately available    Physician(s) Dr. Domenic Polite    Location [facility]    Staff Present Cathren Harsh, MS, Exercise Physiologist;[name], RN, Bjorn Loser, MS, ACSM-CEP, Exercise Physiologist    Virtual Visit No    Medication changes reported     No    Fall or balance concerns reported    No    Tobacco Cessation No Change    Warm-up and Cool-down Performed as group-led instruction    Resistance Training Performed Yes    VAD Patient? No    PAD/SET Patient? No      Pain Assessment   Currently in Pain? No/denies    Pain Score 0-No pain    Multiple Pain Sites No           Capillary Blood Glucose: No results found for this or any previous visit (from the past 24 hour(s)).    Social History   Tobacco Use  Smoking Status Former Smoker   Types: Cigarettes  Smokeless Tobacco Never Used    Goals Met:  Independence with exercise equipment Exercise tolerated well No report of cardiac concerns or symptoms Strength training completed today  Goals Unmet:  Not Applicable  Comments: check out 1545   Dr. Kathie Dike is Medical Director for [facility] Pulmonary Rehab.

## 2020-09-28 ENCOUNTER — Other Ambulatory Visit: Payer: Self-pay

## 2020-09-28 ENCOUNTER — Encounter (HOSPITAL_COMMUNITY)
Admission: RE | Admit: 2020-09-28 | Discharge: 2020-09-28 | Disposition: A | Discharge status: Home / Self Care | Payer: Medicare Other | Source: Ambulatory Visit | Attending: Cardiology | Admitting: Cardiology

## 2020-09-28 DIAGNOSIS — I214 Non-ST elevation (NSTEMI) myocardial infarction: Secondary | ICD-10-CM

## 2020-09-28 NOTE — Progress Notes (Signed)
Daily Session Note  Patient Details  Name: Jeanne Rogers MRN: [id number] Date of Birth: 12/24/1940 Referring Provider:   Flowsheet Row CARDIAC REHAB PHASE II ORIENTATION from 07/05/2020 in [place]  Referring Provider Dr. Harl Bowie      Encounter Date: 09/28/2020  Check In:  Session Check In - 09/28/20 1445      Check-In   Supervising physician immediately available to respond to emergencies [facility] immediately available    Physician(s) Dr. Domenic Polite    Location [facility]    Staff Present Cathren Harsh, MS, Exercise Physiologist;Dalton Kris Mouton, MS, ACSM-CEP, Exercise Physiologist    Virtual Visit No    Medication changes reported     No    Fall or balance concerns reported    No    Tobacco Cessation No Change    Warm-up and Cool-down Performed as group-led instruction    Resistance Training Performed Yes    VAD Patient? No    PAD/SET Patient? No      Pain Assessment   Currently in Pain? No/denies    Pain Score 0-No pain    Multiple Pain Sites No           Capillary Blood Glucose: No results found for this or any previous visit (from the past 24 hour(s)).    Social History   Tobacco Use  Smoking Status Former Smoker  . Types: Cigarettes  Smokeless Tobacco Never Used    Goals Met:  Independence with exercise equipment Exercise tolerated well No report of cardiac concerns or symptoms Strength training completed today  Goals Unmet:  Not Applicable  Comments: check out 1545   Dr. Kathie Dike is Medical Director for [facility] Pulmonary Rehab.

## 2020-10-01 ENCOUNTER — Other Ambulatory Visit: Payer: Self-pay

## 2020-10-01 ENCOUNTER — Encounter (HOSPITAL_COMMUNITY)
Admission: RE | Admit: 2020-10-01 | Discharge: 2020-10-01 | Disposition: A | Discharge status: Home / Self Care | Payer: Medicare Other | Source: Ambulatory Visit | Attending: Cardiology | Admitting: Cardiology

## 2020-10-01 DIAGNOSIS — I214 Non-ST elevation (NSTEMI) myocardial infarction: Secondary | ICD-10-CM

## 2020-10-01 NOTE — Progress Notes (Signed)
Daily Session Note  Patient Details  Name: Jeanne Rogers MRN: [id number] Date of Birth: [birth year] Referring Provider:   Flowsheet Row CARDIAC REHAB PHASE II ORIENTATION from 07/05/2020 in [place]  Referring Provider Dr. Harl Bowie      Encounter Date: 10/01/2020  Check In:  Session Check In - 10/01/20 1445      Check-In   Supervising physician immediately available to respond to emergencies [facility] MD immediately available    Physician(s) Dr. Harl Bowie    Location [facility]    Staff Present Cathren Harsh, MS, Exercise Physiologist;[name], RN, BSN    Virtual Visit No    Medication changes reported     No    Fall or balance concerns reported    No    Tobacco Cessation No Change    Warm-up and Cool-down Performed as group-led instruction    Resistance Training Performed Yes    VAD Patient? No      Pain Assessment   Currently in Pain? No/denies    Pain Score 0-No pain    Multiple Pain Sites No           Capillary Blood Glucose: No results found for this or any previous visit (from the past 24 hour(s)).    Social History   Tobacco Use  Smoking Status Former Smoker  . Types: Cigarettes  Smokeless Tobacco Never Used    Goals Met:  Independence with exercise equipment Exercise tolerated well No report of cardiac concerns or symptoms Strength training completed today  Goals Unmet:  Not Applicable  Comments: check out 1545   Dr. Kathie Dike is Medical Director for [facility] Pulmonary Rehab.

## 2020-10-03 ENCOUNTER — Other Ambulatory Visit: Payer: Self-pay

## 2020-10-03 ENCOUNTER — Encounter (HOSPITAL_COMMUNITY)
Admission: RE | Admit: 2020-10-03 | Discharge: 2020-10-03 | Disposition: A | Discharge status: Home / Self Care | Payer: Medicare Other | Source: Ambulatory Visit | Attending: Cardiology | Admitting: Cardiology

## 2020-10-03 DIAGNOSIS — I214 Non-ST elevation (NSTEMI) myocardial infarction: Secondary | ICD-10-CM | POA: Diagnosis not present

## 2020-10-03 NOTE — Progress Notes (Signed)
Daily Session Note  Patient Details  Name: Jeanne Rogers MRN: [id number] Date of Birth: 13-Jan-1941 Referring Provider:   Flowsheet Row CARDIAC REHAB PHASE II ORIENTATION from 07/05/2020 in [place]  Referring Provider Dr. Harl Bowie      Encounter Date: 10/03/2020  Check In:  Session Check In - 10/03/20 1445      Check-In   Supervising physician immediately available to respond to emergencies [facility] MD immediately available    Physician(s) Dr. Harl Bowie    Location [facility]    Staff Present [name], RN;Kmari Halter Wynetta Emery, RN, BSN;Madison Audria Nine, MS, Exercise Physiologist;Dalton Kris Mouton, MS, ACSM-CEP, Exercise Physiologist    Virtual Visit No    Medication changes reported     No    Fall or balance concerns reported    No    Tobacco Cessation No Change    Warm-up and Cool-down Performed as group-led instruction    Resistance Training Performed Yes    VAD Patient? No    PAD/SET Patient? No      Pain Assessment   Currently in Pain? No/denies    Pain Score 0-No pain    Multiple Pain Sites No           Capillary Blood Glucose: No results found for this or any previous visit (from the past 24 hour(s)).    Social History   Tobacco Use  Smoking Status Former Smoker  . Types: Cigarettes  Smokeless Tobacco Never Used    Goals Met:  Independence with exercise equipment Exercise tolerated well No report of cardiac concerns or symptoms Strength training completed today  Goals Unmet:  Not Applicable  Comments: Check out 1645.   Dr. Kathie Dike is Medical Director for [facility] Pulmonary Rehab.

## 2020-10-05 ENCOUNTER — Encounter (HOSPITAL_COMMUNITY)
Admission: RE | Admit: 2020-10-05 | Discharge: 2020-10-05 | Disposition: A | Discharge status: Home / Self Care | Payer: Medicare Other | Source: Ambulatory Visit | Attending: Cardiology | Admitting: Cardiology

## 2020-10-05 ENCOUNTER — Other Ambulatory Visit: Payer: Self-pay

## 2020-10-05 DIAGNOSIS — I214 Non-ST elevation (NSTEMI) myocardial infarction: Secondary | ICD-10-CM

## 2020-10-05 NOTE — Progress Notes (Signed)
Daily Session Note  Patient Details  Name: Jeanne Rogers MRN: [id number] Date of Birth: [birth year] Referring Provider:   Flowsheet Row CARDIAC REHAB PHASE II ORIENTATION from 07/05/2020 in [place]  Referring Provider Dr. Harl Bowie      Encounter Date: 10/05/2020  Check In:  Session Check In - 10/05/20 1444      Check-In   Supervising physician immediately available to respond to emergencies [facility] MD immediately available    Physician(s) Dr. Domenic Polite    Location [facility]    Staff Present [name], RN;[name], RN, BSN;Madison Audria Nine, MS, Exercise Physiologist;Willye Javier Kris Mouton, MS, ACSM-CEP, Exercise Physiologist    Virtual Visit No    Medication changes reported     No    Fall or balance concerns reported    No    Tobacco Cessation No Change    Warm-up and Cool-down Performed as group-led instruction    Resistance Training Performed Yes    VAD Patient? No    PAD/SET Patient? No      Pain Assessment   Currently in Pain? No/denies    Pain Score 0-No pain    Multiple Pain Sites No           Capillary Blood Glucose: No results found for this or any previous visit (from the past 24 hour(s)).    Social History   Tobacco Use  Smoking Status Former Smoker  . Types: Cigarettes  Smokeless Tobacco Never Used    Goals Met:  Independence with exercise equipment Exercise tolerated well No report of cardiac concerns or symptoms Strength training completed today  Goals Unmet:  Not Applicable  Comments: checkout time is 1545   Dr. Kathie Dike is Medical Director for [facility] Pulmonary Rehab.

## 2020-10-08 ENCOUNTER — Other Ambulatory Visit: Payer: Self-pay

## 2020-10-08 ENCOUNTER — Encounter (HOSPITAL_COMMUNITY)
Admission: RE | Admit: 2020-10-08 | Discharge: 2020-10-08 | Disposition: A | Discharge status: Home / Self Care | Payer: Medicare Other | Source: Ambulatory Visit | Attending: Cardiology | Admitting: Cardiology

## 2020-10-08 VITALS — Wt 119.5 lb

## 2020-10-08 DIAGNOSIS — I214 Non-ST elevation (NSTEMI) myocardial infarction: Secondary | ICD-10-CM

## 2020-10-08 NOTE — Progress Notes (Signed)
Daily Session Note  Patient Details  Name: Jeanne Rogers MRN: [id number] Date of Birth: 27-Apr-1941 Referring Provider:   Flowsheet Row CARDIAC REHAB PHASE II ORIENTATION from 07/05/2020 in [place]  Referring Provider Dr. Harl Bowie      Encounter Date: 10/08/2020  Check In:  Session Check In - 10/08/20 [name]      Check-In   Supervising physician immediately available to respond to emergencies [facility] MD immediately available    Physician(s) Dr. Domenic Polite    Location [facility]    Staff Present Aundra Dubin, RN, BSN;Madison Audria Nine, MS, Exercise Physiologist;[place] Kris Mouton, MS, ACSM-CEP, Exercise Physiologist    Virtual Visit No    Medication changes reported     No    Fall or balance concerns reported    No    Tobacco Cessation No Change    Warm-up and Cool-down Performed as group-led instruction    Resistance Training Performed Yes    VAD Patient? No    PAD/SET Patient? No      Pain Assessment   Currently in Pain? No/denies    Pain Score 0-No pain    Multiple Pain Sites No           Capillary Blood Glucose: No results found for this or any previous visit (from the past 24 hour(s)).    Social History   Tobacco Use  Smoking Status Former Smoker  . Types: Cigarettes  Smokeless Tobacco Never Used    Goals Met:  Independence with exercise equipment Exercise tolerated well No report of cardiac concerns or symptoms Strength training completed today  Goals Unmet:  Not Applicable  Comments: Check out 1545.   Dr. Kathie Dike is Medical Director for [facility].

## 2020-10-10 ENCOUNTER — Other Ambulatory Visit: Payer: Self-pay

## 2020-10-10 ENCOUNTER — Encounter (HOSPITAL_COMMUNITY)
Admission: RE | Admit: 2020-10-10 | Discharge: 2020-10-10 | Disposition: A | Discharge status: Home / Self Care | Payer: Medicare Other | Source: Ambulatory Visit | Attending: Cardiology | Admitting: Cardiology

## 2020-10-10 DIAGNOSIS — I214 Non-ST elevation (NSTEMI) myocardial infarction: Secondary | ICD-10-CM

## 2020-10-10 NOTE — Progress Notes (Signed)
Daily Session Note  Patient Details  Name: Jeanne Rogers MRN: [id number] Date of Birth: 10/27/1940 Referring Provider:   Flowsheet Row CARDIAC REHAB PHASE II ORIENTATION from 07/05/2020 in [place]  Referring Provider Dr. Harl Bowie      Encounter Date: 10/10/2020  Check In:  Session Check In - 10/10/20 1445      Check-In   Supervising physician immediately available to respond to emergencies [facility] MD immediately available    Physician(s) Dr. Domenic Polite    Location [facility]    Staff Present Aundra Dubin, RN, BSN;Madison Audria Nine, MS, Exercise Physiologist;Phyllis Billingsley, RN    Virtual Visit No    Medication changes reported     No    Fall or balance concerns reported    No    Tobacco Cessation No Change    Warm-up and Cool-down Performed as group-led instruction    Resistance Training Performed Yes    VAD Patient? No    PAD/SET Patient? No      Pain Assessment   Currently in Pain? No/denies    Pain Score 0-No pain    Multiple Pain Sites No           Capillary Blood Glucose: No results found for this or any previous visit (from the past 24 hour(s)).    Social History   Tobacco Use  Smoking Status Former Smoker  . Types: Cigarettes  Smokeless Tobacco Never Used    Goals Met:  Independence with exercise equipment Exercise tolerated well No report of cardiac concerns or symptoms Strength training completed today  Goals Unmet:  Not Applicable  Comments: Check out 1545.   Dr. Kathie Dike is Medical Director for [facility].

## 2020-10-10 NOTE — Progress Notes (Signed)
Cardiac Individual Treatment Plan  Patient Details  Name: Jeanne Rogers MRN: [id number] Date of Birth: 12/06/1940 Referring Provider:   Flowsheet Row CARDIAC REHAB PHASE II ORIENTATION from 07/05/2020 in [place]  Referring Provider Dr. Harl Bowie      Initial Encounter Date:  Flowsheet Row CARDIAC REHAB PHASE II ORIENTATION from 07/05/2020 in [place]  Date 07/05/20      Visit Diagnosis: NSTEMI (non-ST elevated myocardial infarction) [facility])  Patient's Home Medications on Admission:  Current Outpatient Medications:  .  acetaminophen (TYLENOL) 500 MG tablet, Take 1,000 mg by mouth every 6 (six) hours as needed for moderate pain., Disp: , Rfl:  .  alendronate (FOSAMAX) 70 MG tablet, Take 1 tablet (70 mg total) by mouth once a week. Take with a full glass of water on an empty stomach. (Patient taking differently: Take 70 mg by mouth every Saturday. Take with a full glass of water on an empty stomach.), Disp: 12 tablet, Rfl: 3 .  citalopram (CELEXA) 10 MG tablet, Take 1 tablet (10 mg total) by mouth daily., Disp: 90 tablet, Rfl: 3 .  ezetimibe (ZETIA) 10 MG tablet, Take 1 tablet (10 mg total) by mouth daily., Disp: 90 tablet, Rfl: 1 .  isosorbide mononitrate (IMDUR) 30 MG 24 hr tablet, Take 1 tablet (30 mg total) by mouth daily., Disp: 30 tablet, Rfl: 3 .  memantine (NAMENDA) 10 MG tablet, Take 1 tablet (10 mg total) by mouth 2 (two) times daily., Disp: 60 tablet, Rfl: 11 .  metoprolol succinate (TOPROL-XL) 25 MG 24 hr tablet, Take 0.5 tablets (12.5 mg total) by mouth daily., Disp: 90 tablet, Rfl: 3 .  zolpidem (AMBIEN) 5 MG tablet, Take 1 tablet (5 mg total) by mouth at bedtime as needed for sleep., Disp: 30 tablet, Rfl: 1  Past Medical History: Past Medical History:  Diagnosis Date  . Anemia   . Collapse, lung   . Complication of anesthesia    very sensitive to anesthesia  . GERD (gastroesophageal reflux disease)   . Hyperlipidemia    . Hypertension   . Osteoporosis   . PONV (postoperative nausea and vomiting)   . PUD (peptic ulcer disease)   . Spontaneous pneumothorax     Tobacco Use: Social History   Tobacco Use  Smoking Status Former Smoker  . Types: Cigarettes  Smokeless Tobacco Never Used    Labs: Recent Review Flowsheet Data    Labs for ITP Cardiac and Pulmonary Rehab Latest Ref Rng & Units 10/18/2014 07/21/2016 11/13/2017 08/25/2019 05/10/2020   Cholestrol 0 - 200 mg/dL 248(H) 251(H) 253(H) 240(H) 231(H)   LDLCALC 0 - 99 mg/dL 164(H) 156(H) 157(H) 150(H) 139(H)   HDL >40 mg/dL 71 77 84 74 71   Trlycerides <150 mg/dL 65 88 60 82 106   Hemoglobin A1c 4.8 - 5.6 % - - - 5.2 5.4      Capillary Blood Glucose: Lab Results  Component Value Date   GLUCAP 103 (H) 08/25/2019     Exercise Target Goals: Exercise Program Goal: Individual exercise prescription set using results from initial 6 min walk test and THRR while considering  patient's activity barriers and safety.   Exercise Prescription Goal: Starting with aerobic activity 30 plus minutes a day, 3 days per week for initial exercise prescription. Provide home exercise prescription and guidelines that participant acknowledges understanding prior to discharge.  Activity Barriers & Risk Stratification:  Activity Barriers & Cardiac Risk Stratification - 07/17/20 1208  Activity Barriers & Cardiac Risk Stratification   Activity Barriers Back Problems;Deconditioning;Muscular Weakness;Balance Concerns    Cardiac Risk Stratification High           6 Minute Walk:  6 Minute Walk    Row Name 07/05/20 1000         6 Minute Walk   Phase Initial     Distance 1250 feet     Walk Time 6 minutes     # of Rest Breaks 0     MPH 2.4     METS 2.7     RPE 8     VO2 Peak 9.56     Symptoms No     Resting HR 67 bpm     Resting BP 130/50     Resting Oxygen Saturation  95 %     Exercise Oxygen Saturation  during 6 min walk 95 %     Max Ex. HR 86 bpm      Max Ex. BP 152/60     2 Minute Post BP 130/58            Oxygen Initial Assessment:   Oxygen Re-Evaluation:   Oxygen Discharge (Final Oxygen Re-Evaluation):   Initial Exercise Prescription:  Initial Exercise Prescription - 07/05/20 1000      Date of Initial Exercise RX and Referring Provider   Date 07/05/20    Referring Provider Dr. Harl Bowie    Expected Discharge Date 09/27/20      NuStep   Level 1    SPM 60    Minutes 22      Arm Ergometer   Level 1    RPM 60    Minutes 17      Prescription Details   Duration Progress to 30 minutes of continuous aerobic without signs/symptoms of physical distress      Intensity   THRR 40-80% of Max Heartrate 57-114    Ratings of Perceived Exertion 11-13      Progression   Progression Continue progressive overload as per policy without signs/symptoms or physical distress.      Resistance Training   Training Prescription Yes    Weight 2    Reps 10-15           Perform Capillary Blood Glucose checks as needed.  Exercise Prescription Changes:   Exercise Prescription Changes    Row Name 07/16/20 1545 07/18/20 1445 08/27/20 1500 09/11/20 0800 09/24/20 1500     Response to Exercise   Blood Pressure (Admit) 152/70 146/78 140/60 128/72 122/68   Blood Pressure (Exercise) 154/70 150/72 166/64 158/74 120/60   Blood Pressure (Exit) 122/80 132/70 132/64 136/80 118/62   Heart Rate (Admit) 75 bpm 81 bpm 88 bpm 99 bpm 81 bpm   Heart Rate (Exercise) 102 bpm 113 bpm 107 bpm 106 bpm 101 bpm   Heart Rate (Exit) 84 bpm 88 bpm 98 bpm 102 bpm 88 bpm   Rating of Perceived Exertion (Exercise) 12 13 12 11 11    Duration Continue with 30 min of aerobic exercise without signs/symptoms of physical distress. Continue with 30 min of aerobic exercise without signs/symptoms of physical distress. Continue with 30 min of aerobic exercise without signs/symptoms of physical distress. Continue with 30 min of aerobic exercise without signs/symptoms of  physical distress. Continue with 30 min of aerobic exercise without signs/symptoms of physical distress.   Intensity THRR unchanged THRR unchanged THRR unchanged THRR unchanged THRR unchanged     Progression   Progression -- Continue to progress  workloads to maintain intensity without signs/symptoms of physical distress. Continue to progress workloads to maintain intensity without signs/symptoms of physical distress. Continue to progress workloads to maintain intensity without signs/symptoms of physical distress. Continue to progress workloads to maintain intensity without signs/symptoms of physical distress.     Resistance Training   Training Prescription Yes Yes Yes Yes Yes   Weight 1 lb 1 lbs 1 lb 1 lb 1   Reps 10-15 10-15 10-15 10-15 10-15   Time 10 Minutes 10 Minutes 10 Minutes 10 Minutes 10 Minutes     NuStep   Level 1 1 1 1 2    SPM 60 64 49 58 66   Minutes 22 22 22 22 17    METs 1.7 1.8 1.7 1.7 1.6     Arm Ergometer   Level 1 1 1 1  1.5   RPM 48 50 43 44 16   Minutes 17 17 17 17 22    METs 2 2.1 2 2  2.2   Row Name 10/08/20 1545             Response to Exercise   Blood Pressure (Admit) 130/66       Blood Pressure (Exercise) 168/72       Blood Pressure (Exit) 112/58       Heart Rate (Admit) 78 bpm       Heart Rate (Exercise) 100 bpm       Heart Rate (Exit) 87 bpm       Rating of Perceived Exertion (Exercise) 11       Duration Continue with 30 min of aerobic exercise without signs/symptoms of physical distress.       Intensity THRR unchanged               Progression   Progression Continue to progress workloads to maintain intensity without signs/symptoms of physical distress.               Resistance Training   Training Prescription Yes       Weight 2       Reps 10-15       Time 10 Minutes               NuStep   Level 2       SPM 66       Minutes 17       METs 1.7               Arm Ergometer   Level 1.5       RPM 48       Minutes 22       METs 2.3               Exercise Comments:   Exercise Comments    Row Name 07/09/20 1543           Exercise Comments Pt completed first exercise session. She tolerated exercise well and has no complaints.              Exercise Goals and Review:   Exercise Goals    Row Name 07/05/20 1004 07/16/20 1545 08/14/20 0759 09/11/20 0820 10/08/20 1545     Exercise Goals   Increase Physical Activity Yes Yes Yes Yes Yes   Intervention Provide advice, education, support and counseling about physical activity/exercise needs.;Develop an individualized exercise prescription for aerobic and resistive training based on initial evaluation findings, risk stratification, comorbidities and participant's personal goals. Provide advice, education, support and counseling about physical activity/exercise needs.;Develop an  individualized exercise prescription for aerobic and resistive training based on initial evaluation findings, risk stratification, comorbidities and participant's personal goals. Provide advice, education, support and counseling about physical activity/exercise needs.;Develop an individualized exercise prescription for aerobic and resistive training based on initial evaluation findings, risk stratification, comorbidities and participant's personal goals. Provide advice, education, support and counseling about physical activity/exercise needs.;Develop an individualized exercise prescription for aerobic and resistive training based on initial evaluation findings, risk stratification, comorbidities and participant's personal goals. Provide advice, education, support and counseling about physical activity/exercise needs.;Develop an individualized exercise prescription for aerobic and resistive training based on initial evaluation findings, risk stratification, comorbidities and participant's personal goals.   Expected Outcomes Short Term: Attend rehab on a regular basis to increase amount of physical activity.;Long  Term: Add in home exercise to make exercise part of routine and to increase amount of physical activity.;Long Term: Exercising regularly at least 3-5 days a week. Short Term: Attend rehab on a regular basis to increase amount of physical activity.;Long Term: Add in home exercise to make exercise part of routine and to increase amount of physical activity.;Long Term: Exercising regularly at least 3-5 days a week. Short Term: Attend rehab on a regular basis to increase amount of physical activity.;Long Term: Add in home exercise to make exercise part of routine and to increase amount of physical activity.;Long Term: Exercising regularly at least 3-5 days a week. Short Term: Attend rehab on a regular basis to increase amount of physical activity.;Long Term: Add in home exercise to make exercise part of routine and to increase amount of physical activity.;Long Term: Exercising regularly at least 3-5 days a week. Short Term: Attend rehab on a regular basis to increase amount of physical activity.;Long Term: Add in home exercise to make exercise part of routine and to increase amount of physical activity.;Long Term: Exercising regularly at least 3-5 days a week.   Increase Strength and Stamina Yes Yes Yes Yes Yes   Intervention Provide advice, education, support and counseling about physical activity/exercise needs.;Develop an individualized exercise prescription for aerobic and resistive training based on initial evaluation findings, risk stratification, comorbidities and participant's personal goals. Provide advice, education, support and counseling about physical activity/exercise needs.;Develop an individualized exercise prescription for aerobic and resistive training based on initial evaluation findings, risk stratification, comorbidities and participant's personal goals. Provide advice, education, support and counseling about physical activity/exercise needs.;Develop an individualized exercise prescription for  aerobic and resistive training based on initial evaluation findings, risk stratification, comorbidities and participant's personal goals. Provide advice, education, support and counseling about physical activity/exercise needs.;Develop an individualized exercise prescription for aerobic and resistive training based on initial evaluation findings, risk stratification, comorbidities and participant's personal goals. Provide advice, education, support and counseling about physical activity/exercise needs.;Develop an individualized exercise prescription for aerobic and resistive training based on initial evaluation findings, risk stratification, comorbidities and participant's personal goals.   Expected Outcomes Short Term: Increase workloads from initial exercise prescription for resistance, speed, and METs.;Short Term: Perform resistance training exercises routinely during rehab and add in resistance training at home;Long Term: Improve cardiorespiratory fitness, muscular endurance and strength as measured by increased METs and functional capacity (6MWT) Short Term: Increase workloads from initial exercise prescription for resistance, speed, and METs.;Short Term: Perform resistance training exercises routinely during rehab and add in resistance training at home;Long Term: Improve cardiorespiratory fitness, muscular endurance and strength as measured by increased METs and functional capacity (6MWT) Short Term: Increase workloads from initial exercise prescription for resistance, speed, and METs.;Short  Term: Perform resistance training exercises routinely during rehab and add in resistance training at home;Long Term: Improve cardiorespiratory fitness, muscular endurance and strength as measured by increased METs and functional capacity (6MWT) Short Term: Increase workloads from initial exercise prescription for resistance, speed, and METs.;Short Term: Perform resistance training exercises routinely during rehab and add  in resistance training at home;Long Term: Improve cardiorespiratory fitness, muscular endurance and strength as measured by increased METs and functional capacity (6MWT) Short Term: Increase workloads from initial exercise prescription for resistance, speed, and METs.;Short Term: Perform resistance training exercises routinely during rehab and add in resistance training at home;Long Term: Improve cardiorespiratory fitness, muscular endurance and strength as measured by increased METs and functional capacity (6MWT)   Able to understand and use rate of perceived exertion (RPE) scale Yes Yes Yes Yes Yes   Intervention Provide education and explanation on how to use RPE scale Provide education and explanation on how to use RPE scale Provide education and explanation on how to use RPE scale Provide education and explanation on how to use RPE scale Provide education and explanation on how to use RPE scale   Expected Outcomes Short Term: Able to use RPE daily in rehab to express subjective intensity level;Long Term:  Able to use RPE to guide intensity level when exercising independently Short Term: Able to use RPE daily in rehab to express subjective intensity level;Long Term:  Able to use RPE to guide intensity level when exercising independently Short Term: Able to use RPE daily in rehab to express subjective intensity level;Long Term:  Able to use RPE to guide intensity level when exercising independently Short Term: Able to use RPE daily in rehab to express subjective intensity level;Long Term:  Able to use RPE to guide intensity level when exercising independently Short Term: Able to use RPE daily in rehab to express subjective intensity level;Long Term:  Able to use RPE to guide intensity level when exercising independently   Knowledge and understanding of Target Heart Rate Range (THRR) Yes Yes Yes Yes Yes   Intervention Provide education and explanation of THRR including how the numbers were predicted and where  they are located for reference Provide education and explanation of THRR including how the numbers were predicted and where they are located for reference Provide education and explanation of THRR including how the numbers were predicted and where they are located for reference Provide education and explanation of THRR including how the numbers were predicted and where they are located for reference Provide education and explanation of THRR including how the numbers were predicted and where they are located for reference   Expected Outcomes Short Term: Able to state/look up THRR;Long Term: Able to use THRR to govern intensity when exercising independently;Short Term: Able to use daily as guideline for intensity in rehab Short Term: Able to state/look up THRR;Long Term: Able to use THRR to govern intensity when exercising independently;Short Term: Able to use daily as guideline for intensity in rehab Short Term: Able to state/look up THRR;Long Term: Able to use THRR to govern intensity when exercising independently;Short Term: Able to use daily as guideline for intensity in rehab Short Term: Able to state/look up THRR;Long Term: Able to use THRR to govern intensity when exercising independently;Short Term: Able to use daily as guideline for intensity in rehab Short Term: Able to state/look up THRR;Long Term: Able to use THRR to govern intensity when exercising independently;Short Term: Able to use daily as guideline for intensity in rehab   Able  to check pulse independently Yes Yes Yes Yes Yes   Intervention Provide education and demonstration on how to check pulse in carotid and radial arteries.;Review the importance of being able to check your own pulse for safety during independent exercise Provide education and demonstration on how to check pulse in carotid and radial arteries.;Review the importance of being able to check your own pulse for safety during independent exercise Provide education and demonstration  on how to check pulse in carotid and radial arteries.;Review the importance of being able to check your own pulse for safety during independent exercise Provide education and demonstration on how to check pulse in carotid and radial arteries.;Review the importance of being able to check your own pulse for safety during independent exercise Provide education and demonstration on how to check pulse in carotid and radial arteries.;Review the importance of being able to check your own pulse for safety during independent exercise   Expected Outcomes Short Term: Able to explain why pulse checking is important during independent exercise;Long Term: Able to check pulse independently and accurately Short Term: Able to explain why pulse checking is important during independent exercise;Long Term: Able to check pulse independently and accurately Short Term: Able to explain why pulse checking is important during independent exercise;Long Term: Able to check pulse independently and accurately Short Term: Able to explain why pulse checking is important during independent exercise;Long Term: Able to check pulse independently and accurately Short Term: Able to explain why pulse checking is important during independent exercise;Long Term: Able to check pulse independently and accurately   Understanding of Exercise Prescription Yes Yes Yes Yes Yes   Intervention Provide education, explanation, and written materials on patient's individual exercise prescription Provide education, explanation, and written materials on patient's individual exercise prescription Provide education, explanation, and written materials on patient's individual exercise prescription Provide education, explanation, and written materials on patient's individual exercise prescription Provide education, explanation, and written materials on patient's individual exercise prescription   Expected Outcomes Short Term: Able to explain program exercise  prescription;Long Term: Able to explain home exercise prescription to exercise independently Short Term: Able to explain program exercise prescription;Long Term: Able to explain home exercise prescription to exercise independently Short Term: Able to explain program exercise prescription;Long Term: Able to explain home exercise prescription to exercise independently Short Term: Able to explain program exercise prescription;Long Term: Able to explain home exercise prescription to exercise independently Short Term: Able to explain program exercise prescription;Long Term: Able to explain home exercise prescription to exercise independently          Exercise Goals Re-Evaluation :  Exercise Goals Re-Evaluation    Row Name 07/16/20 1545 08/14/20 0759 09/11/20 0821 10/08/20 1545       Exercise Goal Re-Evaluation   Exercise Goals Review Increase Physical Activity;Increase Strength and Stamina;Able to understand and use rate of perceived exertion (RPE) scale;Knowledge and understanding of Target Heart Rate Range (THRR);Able to check pulse independently;Understanding of Exercise Prescription Increase Physical Activity;Increase Strength and Stamina;Able to understand and use rate of perceived exertion (RPE) scale;Knowledge and understanding of Target Heart Rate Range (THRR);Able to check pulse independently;Understanding of Exercise Prescription Increase Physical Activity;Increase Strength and Stamina;Able to understand and use rate of perceived exertion (RPE) scale;Knowledge and understanding of Target Heart Rate Range (THRR);Able to check pulse independently;Understanding of Exercise Prescription Increase Physical Activity;Increase Strength and Stamina;Able to understand and use rate of perceived exertion (RPE) scale;Knowledge and understanding of Target Heart Rate Range (THRR);Able to check pulse independently;Understanding of Exercise Prescription  Comments Patient has completed 4 exercise session. She has  tolerated exercise well and has been progressing well. She is currently exercising at 1.7 METs on the NuStep. Will continue to monitor and progress exercise as able. Pt has completed 5 exercise sessions. She has been absent since 07/18/2020 due to compression fractures in her back. She recently underwent a kyphoplasty surgery and it is unclear if/when she will return to rehab. We will follow up with her again soon to see if she will be able to to return to rehab. Will monitor and progress as able. Patient had completed 12 exercise sessions. She has been back exercising for about 2 weeks. She is getting back into exercising and has not seemed to progress much. We plan on increasing intensities on her next exercise session. She is currently exercising at 1.7 METs on the Nu Step. Patient has completed 23 exercise sessions. She has been tolerating exercise well and is progressing. She is progressing slowly, but is eager and willing to increase intensities. She has been exercising at home by walking every day she is not in rehab. She is always positive to have in class and she is very interactive with the other patients in her class.    Expected Outcomes Through exercise at rehab and a home exercise program, patient will reach their goals. Through exercise at rehab and a home exercise program, patient will reach their goals. Through exercise at rehab and a home exercise program, patient will reach their goals. Through exercise at rehab and a home exercise program, patient will reach their goals.            Discharge Exercise Prescription (Final Exercise Prescription Changes):  Exercise Prescription Changes - 10/08/20 1545      Response to Exercise   Blood Pressure (Admit) 130/66    Blood Pressure (Exercise) 168/72    Blood Pressure (Exit) 112/58    Heart Rate (Admit) 78 bpm    Heart Rate (Exercise) 100 bpm    Heart Rate (Exit) 87 bpm    Rating of Perceived Exertion (Exercise) 11    Duration Continue  with 30 min of aerobic exercise without signs/symptoms of physical distress.    Intensity THRR unchanged      Progression   Progression Continue to progress workloads to maintain intensity without signs/symptoms of physical distress.      Resistance Training   Training Prescription Yes    Weight 2    Reps 10-15    Time 10 Minutes      NuStep   Level 2    SPM 66    Minutes 17    METs 1.7      Arm Ergometer   Level 1.5    RPM 48    Minutes 22    METs 2.3           Nutrition:  Target Goals: Understanding of nutrition guidelines, daily intake of sodium 1500mg , cholesterol 200mg , calories 30% from fat and 7% or less from saturated fats, daily to have 5 or more servings of fruits and vegetables.  Biometrics:  Pre Biometrics - 10/08/20 1545      Pre Biometrics   Weight 54.2 kg            Nutrition Therapy Plan and Nutrition Goals:  Nutrition Therapy & Goals - 10/01/20 1254      Personal Nutrition Goals   Comments We will continue to provide heart healthy nutritional education through hand-outs.      Intervention Plan  Intervention Nutrition handout(s) given to patient.           Nutrition Assessments:  Nutrition Assessments - 07/05/20 0958      MEDFICTS Scores   Pre Score 22          MEDIFICTS Score Key:  ?70 Need to make dietary changes   40-70 Heart Healthy Diet  ? 40 Therapeutic Level Cholesterol Diet   Picture Your Plate Scores:  <07 Unhealthy dietary pattern with much room for improvement.  41-50 Dietary pattern unlikely to meet recommendations for good health and room for improvement.  51-60 More healthful dietary pattern, with some room for improvement.   >60 Healthy dietary pattern, although there may be some specific behaviors that could be improved.    Nutrition Goals Re-Evaluation:   Nutrition Goals Discharge (Final Nutrition Goals Re-Evaluation):   Psychosocial: Target Goals: Acknowledge presence or absence of  significant depression and/or stress, maximize coping skills, provide positive support system. Participant is able to verbalize types and ability to use techniques and skills needed for reducing stress and depression.  Initial Review & Psychosocial Screening:  Initial Psych Review & Screening - 07/05/20 0943      Initial Review   Current issues with None Identified      Family Dynamics   Good Support System? Yes    Comments Patient lives with her husband of 102 years. She reports he and her children are her support system along with some friends. She is very active in her community and has a positive outlook. She denies any depression or anxiety. She was evaluated by a neurologist in June 2021 for mild memory loss and irritability. She was diagnosed with mild cognitive impairment and was started on Citalopram 10 mg daily to help wth her mood and irritability. She also takes Namenda 10 mg BID. Will continue to monitor.      Barriers   Psychosocial barriers to participate in program There are no identifiable barriers or psychosocial needs.      Screening Interventions   Interventions Encouraged to exercise    Expected Outcomes Short Term goal: Identification and review with participant of any Quality of Life or Depression concerns found by scoring the questionnaire.;Long Term goal: The participant improves quality of Life and PHQ9 Scores as seen by post scores and/or verbalization of changes           Quality of Life Scores:  Quality of Life - 07/05/20 0959      Quality of Life   Select Quality of Life      Quality of Life Scores   Health/Function Pre 26.62 %    Socioeconomic Pre 28.75 %    Psych/Spiritual Pre 26.5 %    Family Pre 28.8 %    GLOBAL Pre 27.38 %          Scores of 19 and below usually indicate a poorer quality of life in these areas.  A difference of  2-3 points is a clinically meaningful difference.  A difference of 2-3 points in the total score of the Quality of  Life Index has been associated with significant improvement in overall quality of life, self-image, physical symptoms, and general health in studies assessing change in quality of life.  PHQ-9: Recent Review Flowsheet Data    Depression screen [facility] 2/9 07/05/2020 04/16/2020 11/09/2017 07/23/2016   Decreased Interest 0 0 0 0   Down, Depressed, Hopeless 0 1 0 0   PHQ - 2 Score 0 1 0 0  Altered sleeping 1 1 - -   Tired, decreased energy 1 0 - -   Change in appetite 0 0 - -   Feeling bad or failure about yourself  0 0 - -   Trouble concentrating 0 0 - -   Moving slowly or fidgety/restless 0 0 - -   Suicidal thoughts 0 0 - -   PHQ-9 Score 2 2 - -   Difficult doing work/chores Not difficult at all Not difficult at all - -     Interpretation of Total Score  Total Score Depression Severity:  1-4 = Minimal depression, 5-9 = Mild depression, 10-14 = Moderate depression, 15-19 = Moderately severe depression, 20-27 = Severe depression   Psychosocial Evaluation and Intervention:  Psychosocial Evaluation - 07/05/20 0955      Psychosocial Evaluation & Interventions   Interventions Stress management education;Relaxation education;Encouraged to exercise with the program and follow exercise prescription    Comments Patient has no psychosocial barriers identified at her orientation visit. Her initial QOL socre was 27.38 overal. and her PHQ-9 score was 2. Will continue to monitor.    Expected Outcomes Patient will have no psychosocial issues identified at discharge.    Continue Psychosocial Services  No Follow up required           Psychosocial Re-Evaluation:  Psychosocial Re-Evaluation    [place] Name 07/13/20 1438 09/05/20 0813 10/01/20 1254         Psychosocial Re-Evaluation   Current issues with None Identified None Identified None Identified     Comments Patient continue to have no psychosocial issues identified. She demonstrates a positive outlook regarding her future and seems to enjoy coming  to the program. Will continue to monitor. Patient continue to have no psychosocial issues identified. She has completed 10 session having to miss several weeks due to having back surgery. She has returned ans demonstrates a positive outlook regarding her future and seems to enjoy coming to the program. Will continue to monitor. Patient continue to have no psychosocial issues identified. She has completed 20 sessions. She continues to demonstrates a positive outlook regarding her future and seems to enjoy coming to the program. Will continue to monitor.     Expected Outcomes Patient will have no psychosocial issues identified at discharge. Patient will have no psychosocial issues identified at discharge. Patient will have no psychosocial issues identified at discharge.     Interventions Stress management education;Encouraged to attend Cardiac Rehabilitation for the exercise;Relaxation education Stress management education;Encouraged to attend Cardiac Rehabilitation for the exercise;Relaxation education Stress management education;Encouraged to attend Cardiac Rehabilitation for the exercise;Relaxation education     Continue Psychosocial Services  No Follow up required No Follow up required No Follow up required            Psychosocial Discharge (Final Psychosocial Re-Evaluation):  Psychosocial Re-Evaluation - 10/01/20 1254      Psychosocial Re-Evaluation   Current issues with None Identified    Comments Patient continue to have no psychosocial issues identified. She has completed 20 sessions. She continues to demonstrates a positive outlook regarding her future and seems to enjoy coming to the program. Will continue to monitor.    Expected Outcomes Patient will have no psychosocial issues identified at discharge.    Interventions Stress management education;Encouraged to attend Cardiac Rehabilitation for the exercise;Relaxation education    Continue Psychosocial Services  No Follow up required            Vocational Rehabilitation: Provide vocational rehab assistance to  qualifying candidates.   Vocational Rehab Evaluation & Intervention:  Vocational Rehab - 07/05/20 0959      Initial Vocational Rehab Evaluation & Intervention   Assessment shows need for Vocational Rehabilitation No      Vocational Rehab Re-Evaulation   Comments Patient is retired and is not interest in returning to work. She does not need vocational rehab.           Education: Education Goals: Education classes will be provided on a weekly basis, covering required topics. Participant will state understanding/return demonstration of topics presented.  Learning Barriers/Preferences:  Learning Barriers/Preferences - 07/05/20 7782      Learning Barriers/Preferences   Learning Barriers None    Learning Preferences Video;Audio;Written Material           Education Topics: Hypertension, Hypertension Reduction -Define heart disease and high blood pressure. Discus how high blood pressure affects the body and ways to reduce high blood pressure.   Exercise and Your Heart -Discuss why it is important to exercise, the FITT principles of exercise, normal and abnormal responses to exercise, and how to exercise safely.   Angina -Discuss definition of angina, causes of angina, treatment of angina, and how to decrease risk of having angina.   Cardiac Medications -Review what the following cardiac medications are used for, how they affect the body, and side effects that may occur when taking the medications.  Medications include Aspirin, Beta blockers, calcium channel blockers, ACE Inhibitors, angiotensin receptor blockers, diuretics, digoxin, and antihyperlipidemics. Flowsheet Row CARDIAC REHAB PHASE II EXERCISE from 10/03/2020 in [place]  Date 08/29/20  Educator [facility]  Instruction Review Code 2- Demonstrated Understanding      Congestive Heart Failure -Discuss the definition of CHF, how  to live with CHF, the signs and symptoms of CHF, and how keep track of weight and sodium intake. Flowsheet Row CARDIAC REHAB PHASE II EXERCISE from 10/03/2020 in [place]  Date 09/05/20  Educator [facility]  Instruction Review Code 2- Demonstrated Understanding      Heart Disease and Intimacy -Discus the effect sexual activity has on the heart, how changes occur during intimacy as we age, and safety during sexual activity. Flowsheet Row CARDIAC REHAB PHASE II EXERCISE from 10/03/2020 in [place]  Date 09/12/20  Educator [facility]  Instruction Review Code 2- Demonstrated Understanding      Smoking Cessation / COPD -Discuss different methods to quit smoking, the health benefits of quitting smoking, and the definition of COPD. Flowsheet Row CARDIAC REHAB PHASE II EXERCISE from 10/03/2020 in [place]  Date 09/19/20  Educator DF  Instruction Review Code 2- Demonstrated Understanding      Nutrition I: Fats -Discuss the types of cholesterol, what cholesterol does to the heart, and how cholesterol levels can be controlled. Flowsheet Row CARDIAC REHAB PHASE II EXERCISE from 10/03/2020 in [place]  Date 09/26/20  Educator mk  Instruction Review Code 2- Demonstrated Understanding      Nutrition II: Labels -Discuss the different components of food labels and how to read food label [place] from 10/03/2020 in [place]  Date 10/03/20  Educator DJ  Instruction Review Code 1- Verbalizes Understanding      Heart Parts/Heart Disease and PAD -Discuss the anatomy of the heart, the pathway of blood circulation through the heart, and these are affected by heart disease.   Stress I: Signs and Symptoms -Discuss the causes of stress,  how stress may lead to anxiety and depression, and ways to limit stress. Flowsheet Row CARDIAC REHAB PHASE II EXERCISE from  10/03/2020 in [place]  Date 07/18/20  Educator [facility]  Instruction Review Code 2- Demonstrated Understanding      Stress II: Relaxation -Discuss different types of relaxation techniques to limit stress.   Warning Signs of Stroke / TIA -Discuss definition of a stroke, what the signs and symptoms are of a stroke, and how to identify when someone is having stroke.   Knowledge Questionnaire Score:  Knowledge Questionnaire Score - 07/05/20 0958      Knowledge Questionnaire Score   Pre Score 16/24           Core Components/Risk Factors/Patient Goals at Admission:  Personal Goals and Risk Factors at Admission - 07/05/20 0959      Core Components/Risk Factors/Patient Goals on Admission    Weight Management Weight Loss    Personal Goal Other Yes    Personal Goal Get stronger. Get healthier. Be able to do her ADL's.    Intervention Patient will attend CR 3 days/week and supplement with exercise at home 2 days/week.    Expected Outcomes Patient will meet both personal and program goals.           Core Components/Risk Factors/Patient Goals Review:   Goals and Risk Factor Review    Row Name 07/13/20 1439 08/09/20 0732 09/05/20 0814 10/01/20 1255       Core Components/Risk Factors/Patient Goals Review   Personal Goals Review Other -- Other Other    Review Patient is new to the program completing 3 sessions maintaining her weight since her initial visit. She has multiple risk factors for CAD and is here for risk modification. Her personal goals are to get stronger; be albe to do her ADL's and gain confidence. Will continue to monitor for progress. Patient is currently not attending sessions due to compression fractures. She had a kyphoplasty procedure yesterday 08/08/20. Will continue to monitor if she will be able to return to the program. Paitent returned to the program 08/27/20 after being out with Kyphoplasty of T8 and T12 08/08/20. She has an echo 08/17/20 and her EF  was normal at 65-70%. She is glad to be back in the program and is looking forward to coming. Her personal goals for the program are to get stronger; be able to do her ADL's and gain confidence in what she can do. We will continue to monitor her as she works towards these goals. Patient has completed 20 sessions losing 2 lbs since last 30 day review. She continues to do well in the program with consistent attendance. She saw her cardiology 09/05/20 for a routine visit. Her EF is back to normal and no changes were made. Once she follows up with her orthopedic MD, she will be able to resume ASA 81 mg daily. Her personal goals for the program are to get stronger; be able to do her ADL's; gain confidence in what she can do. Will continue to monitor as she works toward meeting these goals.    Expected Outcomes Patient will complete the program meeting both personal and program goals. Patient will complete the program meeting both personal and program goals. Patient will complete the program meeting both personal and program goals. Patient will complete the program meeting both personal and program goals.           Core Components/Risk Factors/Patient Goals at Discharge (Final Review):   Goals and  Risk Factor Review - 10/01/20 1255      Core Components/Risk Factors/Patient Goals Review   Personal Goals Review Other    Review Patient has completed 20 sessions losing 2 lbs since last 30 day review. She continues to do well in the program with consistent attendance. She saw her cardiology 09/05/20 for a routine visit. Her EF is back to normal and no changes were made. Once she follows up with her orthopedic MD, she will be able to resume ASA 81 mg daily. Her personal goals for the program are to get stronger; be able to do her ADL's; gain confidence in what she can do. Will continue to monitor as she works toward meeting these goals.    Expected Outcomes Patient will complete the program meeting both personal and  program goals.           ITP Comments:   Comments: ITP REVIEW Pt is making expected progress toward Cardiac Rehab goals after completing 20 sessions. Recommend continued exercise, life style modification, education, and increased stamina and strength.

## 2020-10-12 ENCOUNTER — Other Ambulatory Visit: Payer: Self-pay

## 2020-10-12 ENCOUNTER — Encounter (HOSPITAL_COMMUNITY)
Admission: RE | Admit: 2020-10-12 | Discharge: 2020-10-12 | Disposition: A | Discharge status: Home / Self Care | Payer: Medicare Other | Source: Ambulatory Visit | Attending: Cardiology | Admitting: Cardiology

## 2020-10-12 DIAGNOSIS — I214 Non-ST elevation (NSTEMI) myocardial infarction: Secondary | ICD-10-CM

## 2020-10-12 NOTE — Progress Notes (Signed)
Daily Session Note  Patient Details  Name: Jeanne Rogers MRN: [id number] Date of Birth: 12/27/1940 Referring Provider:   Flowsheet Row CARDIAC REHAB PHASE II ORIENTATION from 07/05/2020 in [place]  Referring Provider Dr. Harl Bowie      Encounter Date: 10/12/2020  Check In:  Session Check In - 10/12/20 1445      Check-In   Supervising physician immediately available to respond to emergencies [facility] MD immediately available    Physician(s) Dr. Domenic Polite    Location [facility]    Staff Present [name], RN;[name], RN, BSN    Virtual Visit No    Medication changes reported     No    Fall or balance concerns reported    No    Tobacco Cessation No Change    Warm-up and Cool-down Performed as group-led instruction    Resistance Training Performed Yes    VAD Patient? No    PAD/SET Patient? No      Pain Assessment   Currently in Pain? No/denies    Pain Score 0-No pain    Multiple Pain Sites No           Capillary Blood Glucose: No results found for this or any previous visit (from the past 24 hour(s)).    Social History   Tobacco Use  Smoking Status Former Smoker  . Types: Cigarettes  Smokeless Tobacco Never Used    Goals Met:  Independence with exercise equipment Exercise tolerated well No report of cardiac concerns or symptoms Strength training completed today  Goals Unmet:  Not Applicable  Comments: check out @ 3:45   Dr. Kathie Dike is Medical Director for [facility] Pulmonary Rehab.

## 2020-10-15 ENCOUNTER — Other Ambulatory Visit: Payer: Self-pay

## 2020-10-15 ENCOUNTER — Encounter (HOSPITAL_COMMUNITY)
Admission: RE | Admit: 2020-10-15 | Discharge: 2020-10-15 | Disposition: A | Discharge status: Home / Self Care | Payer: Medicare Other | Source: Ambulatory Visit | Attending: Cardiology | Admitting: Cardiology

## 2020-10-15 DIAGNOSIS — I214 Non-ST elevation (NSTEMI) myocardial infarction: Secondary | ICD-10-CM | POA: Diagnosis not present

## 2020-10-15 NOTE — Progress Notes (Signed)
Daily Session Note  Patient Details  Name: Jeanne Rogers MRN: [id number] Date of Birth: 24-May-1941 Referring Provider:   Flowsheet Row CARDIAC REHAB PHASE II ORIENTATION from 07/05/2020 in [place]  Referring Provider Dr. Harl Bowie      Encounter Date: 10/15/2020  Check In:  Session Check In - 10/15/20 1445      Check-In   Supervising physician immediately available to respond to emergencies [facility] MD immediately available    Physician(s) Dr. Harrington Challenger    Location [facility]    Staff Present Cathren Harsh, MS, Exercise Physiologist;Dalton Kris Mouton, MS, ACSM-CEP, Exercise Physiologist    Virtual Visit No    Medication changes reported     No    Fall or balance concerns reported    No    Tobacco Cessation No Change    Warm-up and Cool-down Performed as group-led instruction    Resistance Training Performed Yes    VAD Patient? No    PAD/SET Patient? No      Pain Assessment   Currently in Pain? No/denies    Pain Score 0-No pain    Multiple Pain Sites No           Capillary Blood Glucose: No results found for this or any previous visit (from the past 24 hour(s)).    Social History   Tobacco Use  Smoking Status Former Smoker  . Types: Cigarettes  Smokeless Tobacco Never Used    Goals Met:  Independence with exercise equipment Exercise tolerated well No report of cardiac concerns or symptoms Strength training completed today  Goals Unmet:  Not Applicable  Comments: check out 1545   Dr. Kathie Dike is Medical Director for [facility] Pulmonary Rehab.

## 2020-10-16 DIAGNOSIS — H5203 Hypermetropia, bilateral: Secondary | ICD-10-CM | POA: Diagnosis not present

## 2020-10-16 DIAGNOSIS — H2513 Age-related nuclear cataract, bilateral: Secondary | ICD-10-CM | POA: Diagnosis not present

## 2020-10-16 DIAGNOSIS — H524 Presbyopia: Secondary | ICD-10-CM | POA: Diagnosis not present

## 2020-10-17 ENCOUNTER — Other Ambulatory Visit: Payer: Self-pay

## 2020-10-17 ENCOUNTER — Encounter (HOSPITAL_COMMUNITY)
Admission: RE | Admit: 2020-10-17 | Discharge: 2020-10-17 | Disposition: A | Discharge status: Home / Self Care | Payer: Medicare Other | Source: Ambulatory Visit | Attending: Cardiology | Admitting: Cardiology

## 2020-10-17 DIAGNOSIS — I214 Non-ST elevation (NSTEMI) myocardial infarction: Secondary | ICD-10-CM | POA: Diagnosis not present

## 2020-10-17 NOTE — Progress Notes (Signed)
Daily Session Note  Patient Details  Name: Jeanne Rogers MRN: [id number] Date of Birth: 1941/05/30 Referring Provider:   Flowsheet Row CARDIAC REHAB PHASE II ORIENTATION from 07/05/2020 in [place]  Referring Provider Dr. Harl Bowie      Encounter Date: 10/17/2020  Check In:  Session Check In - 10/17/20 1445      Check-In   Supervising physician immediately available to respond to emergencies [facility] MD immediately available    Physician(s) Dr. Harrington Challenger    Location [facility]    Staff Present Cathren Harsh, MS, Exercise Physiologist;Dalton Kris Mouton, MS, ACSM-CEP, Exercise Physiologist    Virtual Visit No    Medication changes reported     No    Fall or balance concerns reported    No    Tobacco Cessation No Change    Warm-up and Cool-down Performed as group-led instruction    Resistance Training Performed Yes    VAD Patient? No    PAD/SET Patient? No      Pain Assessment   Currently in Pain? No/denies    Pain Score 0-No pain    Multiple Pain Sites No           Capillary Blood Glucose: No results found for this or any previous visit (from the past 24 hour(s)).    Social History   Tobacco Use  Smoking Status Former Smoker  . Types: Cigarettes  Smokeless Tobacco Never Used    Goals Met:  Independence with exercise equipment Exercise tolerated well No report of cardiac concerns or symptoms Strength training completed today  Goals Unmet:  Not Applicable  Comments: check out 1545   Dr. Kathie Dike is Medical Director for [facility] Pulmonary Rehab.

## 2020-10-18 ENCOUNTER — Other Ambulatory Visit: Payer: Self-pay | Admitting: Family Medicine

## 2020-10-18 ENCOUNTER — Ambulatory Visit (INDEPENDENT_AMBULATORY_CARE_PROVIDER_SITE_OTHER): Payer: Medicare Other | Admitting: Family Medicine

## 2020-10-18 ENCOUNTER — Other Ambulatory Visit: Payer: Self-pay

## 2020-10-18 ENCOUNTER — Encounter: Payer: Self-pay | Admitting: Family Medicine

## 2020-10-18 VITALS — BP 118/76 | HR 83 | Temp 97.0°F | Ht 66.0 in | Wt 119.6 lb

## 2020-10-18 DIAGNOSIS — I5181 Takotsubo syndrome: Secondary | ICD-10-CM

## 2020-10-18 DIAGNOSIS — E785 Hyperlipidemia, unspecified: Secondary | ICD-10-CM | POA: Diagnosis not present

## 2020-10-18 DIAGNOSIS — I214 Non-ST elevation (NSTEMI) myocardial infarction: Secondary | ICD-10-CM | POA: Diagnosis not present

## 2020-10-18 DIAGNOSIS — S22000A Wedge compression fracture of unspecified thoracic vertebra, initial encounter for closed fracture: Secondary | ICD-10-CM

## 2020-10-18 MED ORDER — ISOSORBIDE MONONITRATE ER 30 MG PO TB24: 30.0000 mg | ORAL_TABLET | Freq: Every day | ORAL | 1 refills | Status: DC

## 2020-10-18 MED ORDER — EZETIMIBE 10 MG PO TABS: 10.0000 mg | ORAL_TABLET | Freq: Every day | ORAL | 1 refills | Status: DC

## 2020-10-18 MED ORDER — ALENDRONATE SODIUM 70 MG PO TABS: 70.0000 mg | ORAL_TABLET | ORAL | 3 refills | Status: DC

## 2020-10-18 NOTE — Patient Instructions (Signed)
Call cardiology to get seen in next 1-2 months.

## 2020-10-18 NOTE — Telephone Encounter (Signed)
Decline, needs to see cards, told them in room today. Dr. Lovena Le

## 2020-10-18 NOTE — Progress Notes (Signed)
Patient ID: Jeanne Rogers, female    DOB: Aug 06, 1940, 80 y.o.   MRN: [id number]   Chief Complaint  Patient presents with  . Hyperlipidemia    Follow up Follow up on meds   Subjective:    HPI F/u hld-  Had back surgery in 2 vertebrate that had compression fx.  Had kyphoplasty.  Seeing Dr. Rolena Rogers.  Getting better.  HLD- doing well no new concerns.  Compliant with meds. No chest pain, palpitations, myalgias or joint pains. taking zetia.  Doing well.    Medical History Jeanne Rogers has a past medical history of Anemia, Collapse, lung, Complication of anesthesia, GERD (gastroesophageal reflux disease), Hyperlipidemia, Hypertension, Osteoporosis, PONV (postoperative nausea and vomiting), PUD (peptic ulcer disease), and Spontaneous pneumothorax.   Outpatient Encounter Medications as of 10/18/2020  Medication Sig  . aspirin EC 81 MG tablet Take 81 mg by mouth daily. Swallow whole.  . citalopram (CELEXA) 10 MG tablet Take 1 tablet (10 mg total) by mouth daily.  . memantine (NAMENDA) 10 MG tablet Take 1 tablet (10 mg total) by mouth 2 (two) times daily.  . metoprolol succinate (TOPROL-XL) 25 MG 24 hr tablet Take 0.5 tablets (12.5 mg total) by mouth daily.  . [DISCONTINUED] alendronate (FOSAMAX) 70 MG tablet Take 1 tablet (70 mg total) by mouth once a week. Take with a full glass of water on an empty stomach. (Patient taking differently: Take 70 mg by mouth every Saturday. Take with a full glass of water on an empty stomach.)  . [DISCONTINUED] ezetimibe (ZETIA) 10 MG tablet Take 1 tablet (10 mg total) by mouth daily.  . [DISCONTINUED] isosorbide mononitrate (IMDUR) 30 MG 24 hr tablet Take 1 tablet (30 mg total) by mouth daily.  Marland Kitchen acetaminophen (TYLENOL) 500 MG tablet Take 1,000 mg by mouth every 6 (six) hours as needed for moderate pain.  Marland Kitchen alendronate (FOSAMAX) 70 MG tablet Take 1 tablet (70 mg total) by mouth once a week. Take with a full glass of water on an empty stomach.  .  ezetimibe (ZETIA) 10 MG tablet Take 1 tablet (10 mg total) by mouth daily.  . isosorbide mononitrate (IMDUR) 30 MG 24 hr tablet Take 1 tablet (30 mg total) by mouth daily.  Marland Kitchen zolpidem (AMBIEN) 5 MG tablet Take 1 tablet (5 mg total) by mouth at bedtime as needed for sleep.   No facility-administered encounter medications on file as of 10/18/2020.     Review of Systems   Vitals BP 118/76   Pulse 83   Temp (!) 97 F (36.1 C) (Oral)   Ht 5\' 6"  (1.676 m)   Wt 119 lb 9.6 oz (54.3 kg)   SpO2 96%   BMI 19.30 kg/m   Objective:   Physical Exam Vitals and nursing note reviewed.  Constitutional:      General: She is not in acute distress.    Appearance: Normal appearance. She is not ill-appearing.  HENT:     Head: Normocephalic and atraumatic.  Cardiovascular:     Rate and Rhythm: Normal rate and regular rhythm.     Pulses: Normal pulses.     Heart sounds: Normal heart sounds.  Pulmonary:     Effort: Pulmonary effort is normal.     Breath sounds: Normal breath sounds. No wheezing, rhonchi or rales.  Musculoskeletal:        General: Normal range of motion.     Right lower leg: No edema.     Left lower leg: No edema.  Skin:    General: Skin is warm and dry.     Findings: No lesion or rash.  Neurological:     General: No focal deficit present.     Mental Status: She is alert and oriented to person, place, and time.     Cranial Nerves: No cranial nerve deficit.     Gait: Gait normal.  Psychiatric:        Mood and Affect: Mood normal.        Behavior: Behavior normal.        Thought Content: Thought content normal.        Judgment: Judgment normal.      Assessment and Plan   1. Hyperlipidemia, unspecified hyperlipidemia type - ezetimibe (ZETIA) 10 MG tablet; Take 1 tablet (10 mg total) by mouth daily.  Dispense: 90 tablet; Refill: 1  2. Compression fracture of body of thoracic vertebra-T8 (HCC)  3. Takotsubo cardiomyopathy  4. NSTEMI (non-ST elevated myocardial  infarction) ([place])   hld- stable. Cont meds.  H/o compression fx- s/p kyphoplasty.  Improving.   F/u for takotsubo cardiomyopathy and h/o NSTEMI- needs f/u with cards. Call cardiology for follow up for medication refill on imdur and metoprolol.  Has not seen them since the fall 2021.  Insomnia- pt taking ambien prn 5mg . Gave small course in 12/22. Discussed not taking this daily and reviewed risk vs. Benefit of taking these medications long term.  Return for f/u osteoporosis, hld.

## 2020-10-19 ENCOUNTER — Encounter (HOSPITAL_COMMUNITY)
Admission: RE | Admit: 2020-10-19 | Discharge: 2020-10-19 | Disposition: A | Discharge status: Home / Self Care | Payer: Medicare Other | Source: Ambulatory Visit | Attending: Cardiology | Admitting: Cardiology

## 2020-10-19 ENCOUNTER — Other Ambulatory Visit: Payer: Self-pay

## 2020-10-19 DIAGNOSIS — I214 Non-ST elevation (NSTEMI) myocardial infarction: Secondary | ICD-10-CM

## 2020-10-19 NOTE — Progress Notes (Signed)
Daily Session Note  Patient Details  Name: Jeanne Rogers MRN: [id number] Date of Birth: 10-10-1940 Referring Provider:   Flowsheet Row CARDIAC REHAB PHASE II ORIENTATION from 07/05/2020 in [place]  Referring Provider Dr. Harl Bowie      Encounter Date: 10/19/2020  Check In:  Session Check In - 10/19/20 [employer]      Check-In   Supervising physician immediately available to respond to emergencies [facility] MD immediately available    Physician(s) Dr. Domenic Polite    Location [facility]    Staff Present Cathren Harsh, MS, Exercise Physiologist;[name], RN, BSN    Virtual Visit No    Medication changes reported     No    Fall or balance concerns reported    No    Tobacco Cessation No Change    Warm-up and Cool-down Performed as group-led instruction    Resistance Training Performed Yes    VAD Patient? No    PAD/SET Patient? No      Pain Assessment   Currently in Pain? No/denies    Pain Score 0-No pain    Multiple Pain Sites No           Capillary Blood Glucose: No results found for this or any previous visit (from the past 24 hour(s)).    Social History   Tobacco Use  Smoking Status Former Smoker  . Types: Cigarettes  Smokeless Tobacco Never Used    Goals Met:  Independence with exercise equipment Exercise tolerated well No report of cardiac concerns or symptoms Strength training completed today  Goals Unmet:  Not Applicable  Comments: check out 1545   Dr. Kathie Dike is Medical Director for [facility].

## 2020-10-22 ENCOUNTER — Encounter (HOSPITAL_COMMUNITY)
Admission: RE | Admit: 2020-10-22 | Discharge: 2020-10-22 | Disposition: A | Discharge status: Home / Self Care | Payer: Medicare Other | Source: Ambulatory Visit | Attending: Cardiology | Admitting: Cardiology

## 2020-10-22 ENCOUNTER — Other Ambulatory Visit: Payer: Self-pay

## 2020-10-22 VITALS — Wt 118.8 lb

## 2020-10-22 DIAGNOSIS — I214 Non-ST elevation (NSTEMI) myocardial infarction: Secondary | ICD-10-CM | POA: Diagnosis not present

## 2020-10-22 NOTE — Progress Notes (Signed)
Daily Session Note  Patient Details  Name: Jeanne Rogers MRN: [id number] Date of Birth: 20-Dec-1940 Referring Provider:   Flowsheet Row CARDIAC REHAB PHASE II ORIENTATION from 07/05/2020 in [place]  Referring Provider Dr. Harl Bowie      Encounter Date: 10/22/2020  Check In:  Session Check In - 10/22/20 1445      Check-In   Supervising physician immediately available to respond to emergencies [facility] MD immediately available    Physician(s) Dr. Harl Bowie    Location [facility]    Staff Present [name], RN;Madison Audria Nine, MS, Exercise Physiologist    Virtual Visit No    Medication changes reported     No    Fall or balance concerns reported    No    Tobacco Cessation No Change    Warm-up and Cool-down Performed as group-led instruction    Resistance Training Performed Yes    VAD Patient? No    PAD/SET Patient? No      Pain Assessment   Currently in Pain? No/denies    Pain Score 0-No pain    Multiple Pain Sites No           Capillary Blood Glucose: No results found for this or any previous visit (from the past 24 hour(s)).    Social History   Tobacco Use  Smoking Status Former Smoker  . Types: Cigarettes  Smokeless Tobacco Never Used    Goals Met:  Independence with exercise equipment Exercise tolerated well No report of cardiac concerns or symptoms Strength training completed today  Goals Unmet:  Not Applicable  Comments: check out @ 3:45pm   Dr. Kathie Dike is Medical Director for [facility].

## 2020-10-24 ENCOUNTER — Other Ambulatory Visit: Payer: Self-pay

## 2020-10-24 ENCOUNTER — Encounter (HOSPITAL_COMMUNITY)
Admission: RE | Admit: 2020-10-24 | Discharge: 2020-10-24 | Disposition: A | Discharge status: Home / Self Care | Payer: Medicare Other | Source: Ambulatory Visit | Attending: Cardiology | Admitting: Cardiology

## 2020-10-24 DIAGNOSIS — I214 Non-ST elevation (NSTEMI) myocardial infarction: Secondary | ICD-10-CM

## 2020-10-24 NOTE — Progress Notes (Signed)
Daily Session Note  Patient Details  Name: Bina Veenstra MRN: [id number] Date of Birth: 09-30-1940 Referring Provider:   Flowsheet Row CARDIAC REHAB PHASE II ORIENTATION from 07/05/2020 in [place]  Referring Provider Dr. Harl Bowie      Encounter Date: 10/24/2020  Check In:  Session Check In - 10/24/20 1445      Check-In   Supervising physician immediately available to respond to emergencies [facility] MD immediately available    Physician(s) Dr. Harl Bowie    Location [facility]    Staff Present Cathren Harsh, MS, Exercise Physiologist;[name], RN, BSN    Virtual Visit No    Medication changes reported     No    Fall or balance concerns reported    No    Tobacco Cessation No Change    Warm-up and Cool-down Performed as group-led instruction    Resistance Training Performed Yes    VAD Patient? No    PAD/SET Patient? No      Pain Assessment   Currently in Pain? No/denies    Pain Score 0-No pain    Multiple Pain Sites No           Capillary Blood Glucose: No results found for this or any previous visit (from the past 24 hour(s)).    Social History   Tobacco Use  Smoking Status Former Smoker  . Types: Cigarettes  Smokeless Tobacco Never Used    Goals Met:  Independence with exercise equipment Exercise tolerated well No report of cardiac concerns or symptoms Strength training completed today  Goals Unmet:  Not Applicable  Comments: check out 1545   Dr. Kathie Dike is Medical Director for [facility].

## 2020-10-26 ENCOUNTER — Encounter (HOSPITAL_COMMUNITY)
Admission: RE | Admit: 2020-10-26 | Discharge: 2020-10-26 | Disposition: A | Discharge status: Home / Self Care | Payer: Medicare Other | Source: Ambulatory Visit | Attending: Cardiology | Admitting: Cardiology

## 2020-10-26 ENCOUNTER — Other Ambulatory Visit: Payer: Self-pay

## 2020-10-26 VITALS — Ht 64.0 in | Wt 119.3 lb

## 2020-10-26 DIAGNOSIS — I214 Non-ST elevation (NSTEMI) myocardial infarction: Secondary | ICD-10-CM

## 2020-10-26 NOTE — Progress Notes (Signed)
Daily Session Note  Patient Details  Name: Jeanne Rogers MRN: [id number] Date of Birth: 04/05/1941 Referring Provider:   Flowsheet Row CARDIAC REHAB PHASE II ORIENTATION from 07/05/2020 in [place] [facility]  Referring Provider Dr. Branch      Encounter Date: 10/26/2020  Check In:  Session Check In - 10/26/20 1445      Check-In   Supervising physician immediately available to respond to emergencies [facility] MD immediately available    Physician(s) Dr. Ross    Location [facility]    Staff Present  , MS, Exercise Physiologist;Debra Johnson, RN, BSN    Virtual Visit No    Medication changes reported     No    Tobacco Cessation No Change    Warm-up and Cool-down Performed as group-led instruction    Resistance Training Performed Yes    VAD Patient? No    PAD/SET Patient? No      Pain Assessment   Currently in Pain? No/denies    Pain Score 0-No pain    Multiple Pain Sites No           Capillary Blood Glucose: No results found for this or any previous visit (from the past 24 hour(s)).    Social History   Tobacco Use  Smoking Status Former Smoker  . Types: Cigarettes  Smokeless Tobacco Never Used    Goals Met:  Independence with exercise equipment Exercise tolerated well No report of cardiac concerns or symptoms Strength training completed today  Goals Unmet:  Not Applicable  Comments: check out 1545   Dr. Jehanzeb Memon is Medical Director for [facility]. 

## 2020-10-31 NOTE — Progress Notes (Signed)
Discharge Progress Report  Patient Details  Name: Jeanne Rogers MRN: [id number] Date of Birth: June 24, 1941 Referring Provider:   Flowsheet Row CARDIAC REHAB PHASE II ORIENTATION from 07/05/2020 in [place]  Referring Provider Dr. Harl Bowie       Number of Visits: 32  Reason for Discharge:  Patient reached a stable level of exercise. Patient independent in their exercise. Patient has met program and personal goals.  Smoking History:  Social History   Tobacco Use  Smoking Status Former Smoker  . Types: Cigarettes  Smokeless Tobacco Never Used    Diagnosis:  NSTEMI (non-ST elevated myocardial infarction) ([place])  ADL [facility]:   Initial Exercise Prescription:  Initial Exercise Prescription - 07/05/20 1000      Date of Initial Exercise RX and Referring Provider   Date 07/05/20    Referring Provider Dr. Harl Bowie    Expected Discharge Date 09/27/20      NuStep   Level 1    SPM 60    Minutes 22      Arm Ergometer   Level 1    RPM 60    Minutes 17      Prescription Details   Duration Progress to 30 minutes of continuous aerobic without signs/symptoms of physical distress      Intensity   THRR 40-80% of Max Heartrate 57-114    Ratings of Perceived Exertion 11-13      Progression   Progression Continue progressive overload as per policy without signs/symptoms or physical distress.      Resistance Training   Training Prescription Yes    Weight 2    Reps 10-15           Discharge Exercise Prescription (Final Exercise Prescription Changes):  Exercise Prescription Changes - 10/22/20 1500      Response to Exercise   Blood Pressure (Admit) 116/64    Blood Pressure (Exercise) 136/60    Blood Pressure (Exit) 120/70    Heart Rate (Admit) 94 bpm    Heart Rate (Exercise) 121 bpm    Heart Rate (Exit) 99 bpm    Rating of Perceived Exertion (Exercise) 12    Duration Continue with 30 min of aerobic exercise without signs/symptoms of physical  distress.    Intensity THRR unchanged      Progression   Progression Continue to progress workloads to maintain intensity without signs/symptoms of physical distress.      Resistance Training   Training Prescription Yes    Weight 2    Reps 10-15    Time 10 Minutes      NuStep   Level 1    SPM 70    Minutes 17    METs 1.4      Arm Ergometer   Level 1.5    RPM 46    Minutes 22    METs 2.2           Functional Capacity:  6 Minute Walk    Row Name 07/05/20 1000 10/26/20 1520       6 Minute Walk   Phase Initial Discharge    Distance 1250 feet 1375 feet    Walk Time 6 minutes 6 minutes    # of Rest Breaks 0 0    MPH 2.4 2.6    METS 2.7 3.11    RPE 8 12    VO2 Peak 9.56 10.87    Symptoms No No    Resting HR 67 bpm 84 bpm    Resting  BP 130/50 140/62    Resting Oxygen Saturation  95 % 97 %    Exercise Oxygen Saturation  during 6 min walk 95 % 105 %    Max Ex. HR 86 bpm 98 bpm    Max Ex. BP 152/60 150/62    2 Minute Post BP 130/58 138/64           Psychological, QOL, Others - Outcomes: PHQ 2/9: Depression screen [facility] 2/9 10/31/2020 10/18/2020 07/05/2020 04/16/2020 11/09/2017  Decreased Interest 0 0 0 0 0  Down, Depressed, Hopeless 0 0 0 1 0  PHQ - 2 Score 0 0 0 1 0  Altered sleeping 1 - 1 1 -  Tired, decreased energy 0 - 1 0 -  Change in appetite 0 - 0 0 -  Feeling bad or failure about yourself  0 - 0 0 -  Trouble concentrating 0 - 0 0 -  Moving slowly or fidgety/restless 0 - 0 0 -  Suicidal thoughts 0 - 0 0 -  PHQ-9 Score 1 - 2 2 -  Difficult doing work/chores Not difficult at all - Not difficult at all Not difficult at all -    Quality of Life:  Quality of Life - 10/31/20 0801      Quality of Life   Select Quality of Life      Quality of Life Scores   Health/Function Pre 26.62 %    Health/Function Post 23.57 %    Health/Function % Change -11.46 %    Socioeconomic Pre 28.75 %    Socioeconomic Post 27.43 %    Socioeconomic % Change  -4.59 %     Psych/Spiritual Pre 26.5 %    Psych/Spiritual Post 25.36 %    Psych/Spiritual % Change -4.3 %    Family Pre 28.8 %    Family Post 28.8 %    Family % Change 0 %    GLOBAL Pre 27.38 %    GLOBAL Post 25.56 %    GLOBAL % Change -6.65 %           Personal Goals: Goals established at orientation with interventions provided to work toward goal.  Personal Goals and Risk Factors at Admission - 07/05/20 0959      Core Components/Risk Factors/Patient Goals on Admission    Weight Management Weight Loss    Personal Goal Other Yes    Personal Goal Get stronger. Get healthier. Be able to do her ADL's.    Intervention Patient will attend CR 3 days/week and supplement with exercise at home 2 days/week.    Expected Outcomes Patient will meet both personal and program goals.            Personal Goals Discharge:  Goals and Risk Factor Review    Row Name 07/13/20 1439 08/09/20 0732 09/05/20 0814 10/01/20 1255 10/31/20 1032     Core Components/Risk Factors/Patient Goals Review   Personal Goals Review Other -- Other Other Other   Review Patient is new to the program completing 3 sessions maintaining her weight since her initial visit. She has multiple risk factors for CAD and is here for risk modification. Her personal goals are to get stronger; be albe to do her ADL's and gain confidence. Will continue to monitor for progress. Patient is currently not attending sessions due to compression fractures. She had a kyphoplasty procedure yesterday 08/08/20. Will continue to monitor if she will be able to return to the program. Paitent returned to the program 08/27/20 after being out  with Kyphoplasty of T8 and T12 08/08/20. She has an echo 08/17/20 and her EF was normal at 65-70%. She is glad to be back in the program and is looking forward to coming. Her personal goals for the program are to get stronger; be able to do her ADL's and gain confidence in what she can do. We will continue to monitor her as she works  towards these goals. Patient has completed 20 sessions losing 2 lbs since last 30 day review. She continues to do well in the program with consistent attendance. She saw her cardiology 09/05/20 for a routine visit. Her EF is back to normal and no changes were made. Once she follows up with her orthopedic MD, she will be able to resume ASA 81 mg daily. Her personal goals for the program are to get stronger; be able to do her ADL's; gain confidence in what she can do. Will continue to monitor as she works toward meeting these goals. Patient has completed 32 sessions before discharge. She gained about 4 lbs since the start of the program. Her personal goals are to get stronger, improve ADLs, and gain confidence. She feels she has gotten stronger and her ADLs have improved. She is more confident in what she can do. She is going to continue her exercise regimin at home and work on gaining more confidence. She ended the program exercising at 3.11 METs.   Expected Outcomes Patient will complete the program meeting both personal and program goals. Patient will complete the program meeting both personal and program goals. Patient will complete the program meeting both personal and program goals. Patient will complete the program meeting both personal and program goals. --          Exercise Goals and Review:  Exercise Goals    Row Name 07/05/20 1004 07/16/20 1545 08/14/20 0759 09/11/20 0820 10/08/20 1545     Exercise Goals   Increase Physical Activity _0    Intervention Provide advice, education, support and counseling about physical activity/exercise needs.;Develop an individualized exercise prescription for aerobic and resistive training based on initial evaluation findings, risk stratification, comorbidities and participant's personal goals. Provide advice, education, support and counseling about physical activity/exercise needs.;Develop an individualized exercise prescription for aerobic and  resistive training based on initial evaluation findings, risk stratification, comorbidities and participant's personal goals. Provide advice, education, support and counseling about physical activity/exercise needs.;Develop an individualized exercise prescription for aerobic and resistive training based on initial evaluation findings, risk stratification, comorbidities and participant's personal goals. Provide advice, education, support and counseling about physical activity/exercise needs.;Develop an individualized exercise prescription for aerobic and resistive training based on initial evaluation findings, risk stratification, comorbidities and participant's personal goals. Provide advice, education, support and counseling about physical activity/exercise needs.;Develop an individualized exercise prescription for aerobic and resistive training based on initial evaluation findings, risk stratification, comorbidities and participant's personal goals.   Expected Outcomes Short Term: Attend rehab on a regular basis to increase amount of physical activity.;Long Term: Add in home exercise to make exercise part of routine and to increase amount of physical activity.;Long Term: Exercising regularly at least 3-5 days a week. Short Term: Attend rehab on a regular basis to increase amount of physical activity.;Long Term: Add in home exercise to make exercise part of routine and to increase amount of physical activity.;Long Term: Exercising regularly at least 3-5 days a week. Short Term: Attend rehab on a regular basis to increase amount of physical activity.;Long Term: Add in home  exercise to make exercise part of routine and to increase amount of physical activity.;Long Term: Exercising regularly at least 3-5 days a week. Short Term: Attend rehab on a regular basis to increase amount of physical activity.;Long Term: Add in home exercise to make exercise part of routine and to increase amount of physical activity.;Long  Term: Exercising regularly at least 3-5 days a week. Short Term: Attend rehab on a regular basis to increase amount of physical activity.;Long Term: Add in home exercise to make exercise part of routine and to increase amount of physical activity.;Long Term: Exercising regularly at least 3-5 days a week.   Increase Strength and Stamina _0    Intervention Provide advice, education, support and counseling about physical activity/exercise needs.;Develop an individualized exercise prescription for aerobic and resistive training based on initial evaluation findings, risk stratification, comorbidities and participant's personal goals. Provide advice, education, support and counseling about physical activity/exercise needs.;Develop an individualized exercise prescription for aerobic and resistive training based on initial evaluation findings, risk stratification, comorbidities and participant's personal goals. Provide advice, education, support and counseling about physical activity/exercise needs.;Develop an individualized exercise prescription for aerobic and resistive training based on initial evaluation findings, risk stratification, comorbidities and participant's personal goals. Provide advice, education, support and counseling about physical activity/exercise needs.;Develop an individualized exercise prescription for aerobic and resistive training based on initial evaluation findings, risk stratification, comorbidities and participant's personal goals. Provide advice, education, support and counseling about physical activity/exercise needs.;Develop an individualized exercise prescription for aerobic and resistive training based on initial evaluation findings, risk stratification, comorbidities and participant's personal goals.   Expected Outcomes Short Term: Increase workloads from initial exercise prescription for resistance, speed, and METs.;Short Term: Perform resistance training exercises  routinely during rehab and add in resistance training at home;Long Term: Improve cardiorespiratory fitness, muscular endurance and strength as measured by increased METs and functional capacity (6MWT) Short Term: Increase workloads from initial exercise prescription for resistance, speed, and METs.;Short Term: Perform resistance training exercises routinely during rehab and add in resistance training at home;Long Term: Improve cardiorespiratory fitness, muscular endurance and strength as measured by increased METs and functional capacity (6MWT) Short Term: Increase workloads from initial exercise prescription for resistance, speed, and METs.;Short Term: Perform resistance training exercises routinely during rehab and add in resistance training at home;Long Term: Improve cardiorespiratory fitness, muscular endurance and strength as measured by increased METs and functional capacity (6MWT) Short Term: Increase workloads from initial exercise prescription for resistance, speed, and METs.;Short Term: Perform resistance training exercises routinely during rehab and add in resistance training at home;Long Term: Improve cardiorespiratory fitness, muscular endurance and strength as measured by increased METs and functional capacity (6MWT) Short Term: Increase workloads from initial exercise prescription for resistance, speed, and METs.;Short Term: Perform resistance training exercises routinely during rehab and add in resistance training at home;Long Term: Improve cardiorespiratory fitness, muscular endurance and strength as measured by increased METs and functional capacity (6MWT)   Able to understand and use rate of perceived exertion (RPE) scale _1    Intervention Provide education and explanation on how to use RPE scale Provide education and explanation on how to use RPE scale Provide education and explanation on how to use RPE scale Provide education and explanation on how to use RPE scale Provide  education and explanation on how to use RPE scale   Expected Outcomes Short Term: Able to use RPE daily in rehab to express subjective intensity level;Long Term:  Able to use  RPE to guide intensity level when exercising independently Short Term: Able to use RPE daily in rehab to express subjective intensity level;Long Term:  Able to use RPE to guide intensity level when exercising independently Short Term: Able to use RPE daily in rehab to express subjective intensity level;Long Term:  Able to use RPE to guide intensity level when exercising independently Short Term: Able to use RPE daily in rehab to express subjective intensity level;Long Term:  Able to use RPE to guide intensity level when exercising independently Short Term: Able to use RPE daily in rehab to express subjective intensity level;Long Term:  Able to use RPE to guide intensity level when exercising independently   Knowledge and understanding of Target Heart Rate Range (THRR) _0    Intervention Provide education and explanation of THRR including how the numbers were predicted and where they are located for reference Provide education and explanation of THRR including how the numbers were predicted and where they are located for reference Provide education and explanation of THRR including how the numbers were predicted and where they are located for reference Provide education and explanation of THRR including how the numbers were predicted and where they are located for reference Provide education and explanation of THRR including how the numbers were predicted and where they are located for reference   Expected Outcomes Short Term: Able to state/look up THRR;Long Term: Able to use THRR to govern intensity when exercising independently;Short Term: Able to use daily as guideline for intensity in rehab Short Term: Able to state/look up THRR;Long Term: Able to use THRR to govern intensity when exercising independently;Short Term: Able  to use daily as guideline for intensity in rehab Short Term: Able to state/look up THRR;Long Term: Able to use THRR to govern intensity when exercising independently;Short Term: Able to use daily as guideline for intensity in rehab Short Term: Able to state/look up THRR;Long Term: Able to use THRR to govern intensity when exercising independently;Short Term: Able to use daily as guideline for intensity in rehab Short Term: Able to state/look up THRR;Long Term: Able to use THRR to govern intensity when exercising independently;Short Term: Able to use daily as guideline for intensity in rehab   Able to check pulse independently _1    Intervention Provide education and demonstration on how to check pulse in carotid and radial arteries.;Review the importance of being able to check your own pulse for safety during independent exercise Provide education and demonstration on how to check pulse in carotid and radial arteries.;Review the importance of being able to check your own pulse for safety during independent exercise Provide education and demonstration on how to check pulse in carotid and radial arteries.;Review the importance of being able to check your own pulse for safety during independent exercise Provide education and demonstration on how to check pulse in carotid and radial arteries.;Review the importance of being able to check your own pulse for safety during independent exercise Provide education and demonstration on how to check pulse in carotid and radial arteries.;Review the importance of being able to check your own pulse for safety during independent exercise   Expected Outcomes Short Term: Able to explain why pulse checking is important during independent exercise;Long Term: Able to check pulse independently and accurately Short Term: Able to explain why pulse checking is important during independent exercise;Long Term: Able to check pulse independently and accurately Short Term: Able  to explain why pulse checking is important during independent exercise;Long  Term: Able to check pulse independently and accurately Short Term: Able to explain why pulse checking is important during independent exercise;Long Term: Able to check pulse independently and accurately Short Term: Able to explain why pulse checking is important during independent exercise;Long Term: Able to check pulse independently and accurately   Understanding of Exercise Prescription _0    Intervention Provide education, explanation, and written materials on patient's individual exercise prescription Provide education, explanation, and written materials on patient's individual exercise prescription Provide education, explanation, and written materials on patient's individual exercise prescription Provide education, explanation, and written materials on patient's individual exercise prescription Provide education, explanation, and written materials on patient's individual exercise prescription   Expected Outcomes Short Term: Able to explain program exercise prescription;Long Term: Able to explain home exercise prescription to exercise independently Short Term: Able to explain program exercise prescription;Long Term: Able to explain home exercise prescription to exercise independently Short Term: Able to explain program exercise prescription;Long Term: Able to explain home exercise prescription to exercise independently Short Term: Able to explain program exercise prescription;Long Term: Able to explain home exercise prescription to exercise independently Short Term: Able to explain program exercise prescription;Long Term: Able to explain home exercise prescription to exercise independently          Exercise Goals Re-Evaluation:  Exercise Goals Re-Evaluation    Row Name 07/16/20 1545 08/14/20 0759 09/11/20 0821 10/08/20 1545 10/31/20 0912     Exercise Goal Re-Evaluation   Exercise Goals Review Increase  Physical Activity;Increase Strength and Stamina;Able to understand and use rate of perceived exertion (RPE) scale;Knowledge and understanding of Target Heart Rate Range (THRR);Able to check pulse independently;Understanding of Exercise Prescription Increase Physical Activity;Increase Strength and Stamina;Able to understand and use rate of perceived exertion (RPE) scale;Knowledge and understanding of Target Heart Rate Range (THRR);Able to check pulse independently;Understanding of Exercise Prescription Increase Physical Activity;Increase Strength and Stamina;Able to understand and use rate of perceived exertion (RPE) scale;Knowledge and understanding of Target Heart Rate Range (THRR);Able to check pulse independently;Understanding of Exercise Prescription Increase Physical Activity;Increase Strength and Stamina;Able to understand and use rate of perceived exertion (RPE) scale;Knowledge and understanding of Target Heart Rate Range (THRR);Able to check pulse independently;Understanding of Exercise Prescription Increase Physical Activity;Increase Strength and Stamina;Able to understand and use rate of perceived exertion (RPE) scale;Knowledge and understanding of Target Heart Rate Range (THRR);Able to check pulse independently;Understanding of Exercise Prescription   Comments Patient has completed 4 exercise session. She has tolerated exercise well and has been progressing well. She is currently exercising at 1.7 METs on the NuStep. Will continue to monitor and progress exercise as able. Pt has completed 5 exercise sessions. She has been absent since 07/18/2020 due to compression fractures in her back. She recently underwent a kyphoplasty surgery and it is unclear if/when she will return to rehab. We will follow up with her again soon to see if she will be able to to return to rehab. Will monitor and progress as able. Patient had completed 12 exercise sessions. She has been back exercising for about 2 weeks. She is  getting back into exercising and has not seemed to progress much. We plan on increasing intensities on her next exercise session. She is currently exercising at 1.7 METs on the Nu Step. Patient has completed 23 exercise sessions. She has been tolerating exercise well and is progressing. She is progressing slowly, but is eager and willing to increase intensities. She has been exercising at home by walking every day she is  not in rehab. She is always positive to have in class and she is very interactive with the other patients in her class. Patient completed 32 sessions before discharge. She progressed very well with exercise. She plans to continue exercising at home by walking every day. She was awesome to work with. She always had a positive attitude. She felt that she has gotten stronger throughout the program.   Expected Outcomes Through exercise at rehab and a home exercise program, patient will reach their goals. Through exercise at rehab and a home exercise program, patient will reach their goals. Through exercise at rehab and a home exercise program, patient will reach their goals. Through exercise at rehab and a home exercise program, patient will reach their goals. --          Nutrition & Weight - Outcomes:  Pre Biometrics - 10/26/20 1523      Pre Biometrics   Height 5' 4" (1.626 m)    Weight 54.1 kg    Waist Circumference 27 inches    Hip Circumference 34.5 inches    Waist to Hip Ratio 0.78 %    BMI (Calculated) 20.46    Triceps Skinfold 13 mm    % Body Fat 31 %    Grip Strength 19 kg    Single Leg Stand 2 seconds            Nutrition:  Nutrition Therapy & Goals - 10/01/20 1254      Personal Nutrition Goals   Comments We will continue to provide heart healthy nutritional education through hand-outs.      Intervention Plan   Intervention Nutrition handout(s) given to patient.           Nutrition Discharge:  Nutrition Assessments - 10/31/20 0755      MEDFICTS Scores    Pre Score 22    Post Score 16    Score Difference -6           Education Questionnaire Score:  Knowledge Questionnaire Score - 10/31/20 0755      Knowledge Questionnaire Score   Pre Score 16/24    Post Score 21/24          Patient graduated from [facility] today on 10-26-20 after completing 32 sessions. They achieved LTG of 30 minutes of aerobic exercise at Max Met level of 3.11. All patients vitals are WNL. Discharge instruction has been reviewed in detail and patient stated an understanding of material given. Patient plans to exercise at home. Cardiac Rehab staff will make f/u call. Patient had no complaints of any abnormal S/S or pain on their exit visit.     Goals reviewed with patient; copy given to patient.

## 2020-11-02 ENCOUNTER — Encounter: Payer: Self-pay | Admitting: Family Medicine

## 2020-11-16 DIAGNOSIS — M8008XA Age-related osteoporosis with current pathological fracture, vertebra(e), initial encounter for fracture: Secondary | ICD-10-CM | POA: Diagnosis not present

## 2020-12-29 ENCOUNTER — Other Ambulatory Visit: Payer: Self-pay | Admitting: Family Medicine

## 2021-01-01 ENCOUNTER — Ambulatory Visit (INDEPENDENT_AMBULATORY_CARE_PROVIDER_SITE_OTHER): Payer: Medicare Other

## 2021-01-01 ENCOUNTER — Other Ambulatory Visit: Payer: Self-pay

## 2021-01-01 VITALS — BP 120/72 | HR 75 | Temp 98.1°F | Wt 119.2 lb

## 2021-01-01 DIAGNOSIS — Z1231 Encounter for screening mammogram for malignant neoplasm of breast: Secondary | ICD-10-CM

## 2021-01-01 DIAGNOSIS — Z0000 Encounter for general adult medical examination without abnormal findings: Secondary | ICD-10-CM | POA: Diagnosis not present

## 2021-01-01 DIAGNOSIS — Z780 Asymptomatic menopausal state: Secondary | ICD-10-CM | POA: Diagnosis not present

## 2021-01-01 NOTE — Patient Instructions (Signed)
Ms. Huneycutt , Thank you for taking time to come for your Medicare Wellness Visit. I appreciate your ongoing commitment to your health goals. Please review the following plan we discussed and let me know if I can assist you in the future.   Screening recommendations/referrals: Colonoscopy: No longer required  Mammogram: Currently due, order placed this visit Bone Density: Currently due, orders placed this visit Recommended yearly ophthalmology/optometry visit for glaucoma screening and checkup Recommended yearly dental visit for hygiene and checkup  Vaccinations: Influenza vaccine: Up to date, next due fall 2022  Pneumococcal vaccine: Completed series  Tdap vaccine: Up to date, next due 05/24/2024 Shingles vaccine: Currently due for Shingrix, if you would like to receive you may receive at your local pharmacy     Advanced directives: Advance directive discussed with you today. Even though you declined this today please call our office should you change your mind and we can give you the proper paperwork for you to fill out.   Conditions/risks identified: None   Next appointment: None   Preventive Care 65 Years and Older, Female Preventive care refers to lifestyle choices and visits with your health care provider that can promote health and wellness. What does preventive care include?  A yearly physical exam. This is also called an annual well check.  Dental exams once or twice a year.  Routine eye exams. Ask your health care provider how often you should have your eyes checked.  Personal lifestyle choices, including:  Daily care of your teeth and gums.  Regular physical activity.  Eating a healthy diet.  Avoiding tobacco and drug use.  Limiting alcohol use.  Practicing safe sex.  Taking low-dose aspirin every day.  Taking vitamin and mineral supplements as recommended by your health care provider. What happens during an annual well check? The services and screenings  done by your health care provider during your annual well check will depend on your age, overall health, lifestyle risk factors, and family history of disease. Counseling  Your health care provider may ask you questions about your:  Alcohol use.  Tobacco use.  Drug use.  Emotional well-being.  Home and relationship well-being.  Sexual activity.  Eating habits.  History of falls.  Memory and ability to understand (cognition).  Work and work Statistician.  Reproductive health. Screening  You may have the following tests or measurements:  Height, weight, and BMI.  Blood pressure.  Lipid and cholesterol levels. These may be checked every 5 years, or more frequently if you are over 77 years old.  Skin check.  Lung cancer screening. You may have this screening every year starting at age 44 if you have a 30-pack-year history of smoking and currently smoke or have quit within the past 15 years.  Fecal occult blood test (FOBT) of the stool. You may have this test every year starting at age 9.  Flexible sigmoidoscopy or colonoscopy. You may have a sigmoidoscopy every 5 years or a colonoscopy every 10 years starting at age 26.  Hepatitis C blood test.  Hepatitis B blood test.  Sexually transmitted disease (STD) testing.  Diabetes screening. This is done by checking your blood sugar (glucose) after you have not eaten for a while (fasting). You may have this done every 1-3 years.  Bone density scan. This is done to screen for osteoporosis. You may have this done starting at age 76.  Mammogram. This may be done every 1-2 years. Talk to your health care provider about how often you  should have regular mammograms. Talk with your health care provider about your test results, treatment options, and if necessary, the need for more tests. Vaccines  Your health care provider may recommend certain vaccines, such as:  Influenza vaccine. This is recommended every year.  Tetanus,  diphtheria, and acellular pertussis (Tdap, Td) vaccine. You may need a Td booster every 10 years.  Zoster vaccine. You may need this after age 46.  Pneumococcal 13-valent conjugate (PCV13) vaccine. One dose is recommended after age 29.  Pneumococcal polysaccharide (PPSV23) vaccine. One dose is recommended after age 79. Talk to your health care provider about which screenings and vaccines you need and how often you need them. This information is not intended to replace advice given to you by your health care provider. Make sure you discuss any questions you have with your health care provider. Document Released: 08/17/2015 Document Revised: 04/09/2016 Document Reviewed: 05/22/2015 Elsevier Interactive Patient Education  2017 [place] Prevention in the Home Falls can cause injuries. They can happen to people of all ages. There are many things you can do to make your home safe and to help prevent falls. What can I do on the outside of my home?  Regularly fix the edges of walkways and driveways and fix any cracks.  Remove anything that might make you trip as you walk through a door, such as a raised step or threshold.  Trim any bushes or trees on the path to your home.  Use bright outdoor lighting.  Clear any walking paths of anything that might make someone trip, such as rocks or tools.  Regularly check to see if handrails are loose or broken. Make sure that both sides of any steps have handrails.  Any raised decks and porches should have guardrails on the edges.  Have any leaves, snow, or ice cleared regularly.  Use sand or salt on walking paths during winter.  Clean up any spills in your garage right away. This includes oil or grease spills. What can I do in the bathroom?  Use night lights.  Install grab bars by the toilet and in the tub and shower. Do not use towel bars as grab bars.  Use non-skid mats or decals in the tub or shower.  If you need to sit down in  the shower, use a plastic, non-slip stool.  Keep the floor dry. Clean up any water that spills on the floor as soon as it happens.  Remove soap buildup in the tub or shower regularly.  Attach bath mats securely with double-sided non-slip rug tape.  Do not have throw rugs and other things on the floor that can make you trip. What can I do in the bedroom?  Use night lights.  Make sure that you have a light by your bed that is easy to reach.  Do not use any sheets or blankets that are too big for your bed. They should not hang down onto the floor.  Have a firm chair that has side arms. You can use this for support while you get dressed.  Do not have throw rugs and other things on the floor that can make you trip. What can I do in the kitchen?  Clean up any spills right away.  Avoid walking on wet floors.  Keep items that you use a lot in easy-to-reach places.  If you need to reach something above you, use a strong step stool that has a grab bar.  Keep electrical cords out  of the way.  Do not use floor polish or wax that makes floors slippery. If you must use wax, use non-skid floor wax.  Do not have throw rugs and other things on the floor that can make you trip. What can I do with my stairs?  Do not leave any items on the stairs.  Make sure that there are handrails on both sides of the stairs and use them. Fix handrails that are broken or loose. Make sure that handrails are as long as the stairways.  Check any carpeting to make sure that it is firmly attached to the stairs. Fix any carpet that is loose or worn.  Avoid having throw rugs at the top or bottom of the stairs. If you do have throw rugs, attach them to the floor with carpet tape.  Make sure that you have a light switch at the top of the stairs and the bottom of the stairs. If you do not have them, ask someone to add them for you. What else can I do to help prevent falls?  Wear shoes that:  Do not have high  heels.  Have rubber bottoms.  Are comfortable and fit you well.  Are closed at the toe. Do not wear sandals.  If you use a stepladder:  Make sure that it is fully opened. Do not climb a closed stepladder.  Make sure that both sides of the stepladder are locked into place.  Ask someone to hold it for you, if possible.  Clearly mark and make sure that you can see:  Any grab bars or handrails.  First and last steps.  Where the edge of each step is.  Use tools that help you move around (mobility aids) if they are needed. These include:  Canes.  Walkers.  Scooters.  Crutches.  Turn on the lights when you go into a dark area. Replace any light bulbs as soon as they burn out.  Set up your furniture so you have a clear path. Avoid moving your furniture around.  If any of your floors are uneven, fix them.  If there are any pets around you, be aware of where they are.  Review your medicines with your doctor. Some medicines can make you feel dizzy. This can increase your chance of falling. Ask your doctor what other things that you can do to help prevent falls. This information is not intended to replace advice given to you by your health care provider. Make sure you discuss any questions you have with your health care provider. Document Released: 05/17/2009 Document Revised: 12/27/2015 Document Reviewed: 08/25/2014 Elsevier Interactive Patient Education  2017 [employer].

## 2021-01-01 NOTE — Progress Notes (Signed)
Subjective:   Jeanne Rogers is a 80 y.o. female who presents for Medicare Annual (Subsequent) preventive examination.         Review of Systems    N/A  Cardiac Risk Factors include: advanced age (>92men, >9 women);hypertension;dyslipidemia     Objective:    Today's Vitals   01/01/21 0919  BP: 120/72  Pulse: 75  Temp: 98.1 F (36.7 C)  TempSrc: Temporal  SpO2: 97%  Weight: 119 lb 4 oz (54.1 kg)   Body mass index is 20.47 kg/m.  Advanced Directives 01/01/2021 08/06/2020 07/05/2020 05/11/2020 05/10/2020 05/09/2020 08/25/2019  Does Patient Have a Medical Advance Directive? No No No No No Yes No  Type of Advance Directive - - - - - Press photographer;Living will -  Does patient want to make changes to medical advance directive? - - - - - - -  Would patient like information on creating a medical advance directive? No - Patient declined - No - Patient declined No - Patient declined Yes (Inpatient - patient requests chaplain consult to create a medical advance directive) - No - Patient declined    Current Medications (verified) Outpatient Encounter Medications as of 01/01/2021  Medication Sig  . acetaminophen (TYLENOL) 500 MG tablet Take 1,000 mg by mouth every 6 (six) hours as needed for moderate pain.  Marland Kitchen alendronate (FOSAMAX) 70 MG tablet Take 1 tablet (70 mg total) by mouth once a week. Take with a full glass of water on an empty stomach.  Marland Kitchen aspirin EC 81 MG tablet Take 81 mg by mouth daily. Swallow whole.  . citalopram (CELEXA) 10 MG tablet Take 1 tablet (10 mg total) by mouth daily.  Marland Kitchen ezetimibe (ZETIA) 10 MG tablet Take 1 tablet (10 mg total) by mouth daily.  . isosorbide mononitrate (IMDUR) 30 MG 24 hr tablet Take 1 tablet (30 mg total) by mouth daily.  . memantine (NAMENDA) 10 MG tablet Take 1 tablet (10 mg total) by mouth 2 (two) times daily.  . metoprolol succinate (TOPROL-XL) 25 MG 24 hr tablet Take 0.5 tablets (12.5 mg total) by mouth daily.  Marland Kitchen zolpidem  (AMBIEN) 5 MG tablet Take 1 tablet (5 mg total) by mouth at bedtime as needed for sleep. (Patient not taking: Reported on 01/01/2021)   No facility-administered encounter medications on file as of 01/01/2021.    Allergies (verified) Clopidogrel and Methergine [methylergonovine maleate]   History: Past Medical History:  Diagnosis Date  . Anemia   . Collapse, lung   . Complication of anesthesia    very sensitive to anesthesia  . GERD (gastroesophageal reflux disease)   . Hyperlipidemia   . Hypertension   . Osteoporosis   . PONV (postoperative nausea and vomiting)   . PUD (peptic ulcer disease)   . Spontaneous pneumothorax    Past Surgical History:  Procedure Laterality Date  . COLONOSCOPY N/A 01/04/2014   Procedure: COLONOSCOPY;  Surgeon: Rogene Houston, MD;  [facility];  Service: Endoscopy;  Laterality: N/A;  200  . ESOPHAGOGASTRODUODENOSCOPY N/A 01/04/2014   Procedure: ESOPHAGOGASTRODUODENOSCOPY (EGD);  Surgeon: Rogene Houston, MD;  Location: [facility];  Service: Endoscopy;  Laterality: N/A;  . hysterectomy    . KYPHOPLASTY N/A 08/08/2020   Procedure: KYPHOPLASTY THORACIC EIGHT AND THORACIC TWELVE;  Surgeon: Melina Schools, MD;  Location: [facility];  Service: Orthopedics;  Laterality: N/A;  . LEFT HEART CATH AND CORONARY ANGIOGRAPHY N/A 05/10/2020   Procedure: LEFT HEART CATH AND CORONARY ANGIOGRAPHY;  Surgeon: Belva Crome, MD;  Location: [facility];  Service: Cardiovascular;  Laterality: N/A;  . MASS EXCISION Right 02/07/2016   Procedure: EXCISION MASS RIGHT INDEX FINGER AND RIGHT HAND MASS;  Surgeon: Leanora Cover, MD;  Location: [facility];  Service: Orthopedics;  Laterality: Right;  . OOPHORECTOMY Bilateral 2005   Family History  Problem Relation Age of Onset  . Hyperlipidemia Mother   . Hypertension Father    Social History   Socioeconomic History  . Marital status: Married    Spouse name: Not on file  . Number of children: Not on file   . Years of education: Not on file  . Highest education level: Not on file  Occupational History  . Not on file  Tobacco Use  . Smoking status: Former Smoker    Types: Cigarettes  . Smokeless tobacco: Never Used  Vaping Use  . Vaping Use: Never used  Substance and Sexual Activity  . Alcohol use: No  . Drug use: No  . Sexual activity: Not on file  Other Topics Concern  . Not on file  Social History Narrative  . Not on file   Social Determinants of Health   Financial Resource Strain: Low Risk   . Difficulty of Paying Living Expenses: Not hard at all  Food Insecurity: No Food Insecurity  . Worried About Charity fundraiser in the Last Year: Never true  . Ran Out of Food in the Last Year: Never true  Transportation Needs: No Transportation Needs  . Lack of Transportation (Medical): No  . Lack of Transportation (Non-Medical): No  Physical Activity: Inactive  . Days of Exercise per Week: 0 days  . Minutes of Exercise per Session: 0 min  Stress: No Stress Concern Present  . Feeling of Stress : Not at all  Social Connections: Socially Integrated  . Frequency of Communication with Friends and Family: More than three times a week  . Frequency of Social Gatherings with Friends and Family: More than three times a week  . Attends Religious Services: More than 4 times per year  . Active Member of Clubs or Organizations: Yes  . Attends Archivist Meetings: More than 4 times per year  . Marital Status: Married    Tobacco Counseling Counseling given: Not Answered   Clinical Intake:  Pre-visit preparation completed: Yes  Pain : No/denies pain     Diabetes: No  How often do you need to have someone help you when you read instructions, pamphlets, or other written materials from your doctor or pharmacy?: 1 - Never  Diabetic? No   Interpreter Needed?: No  Information entered by :: [place] of Daily Living In your present state of health, do you  have any difficulty performing the following activities: 01/01/2021 08/06/2020  Hearing? N Y  Vision? N N  Difficulty concentrating or making decisions? Tempie Donning  Walking or climbing stairs? N Y  Dressing or bathing? N N  Doing errands, shopping? N N  Preparing Food and eating ? N -  Using the Toilet? N -  In the past six months, have you accidently leaked urine? N -  Do you have problems with loss of bowel control? N -  Managing your Medications? N -  Managing your Finances? N -  Housekeeping or managing your Housekeeping? N -  Some recent data might be hidden    Patient Care Team: Erven Colla, DO as PCP - General (Family Medicine)  Indicate any recent Medical Services you may have received from other than [facility] providers in the past year (date may be approximate).     Assessment:   This is a routine wellness examination for Jeanne Rogers.  Hearing/Vision screen  Hearing Screening   125Hz  250Hz  500Hz  1000Hz  2000Hz  3000Hz  4000Hz  6000Hz  8000Hz   Right ear:           Left ear:           Vision Screening Comments: Patient states gets eyes examined once per year. Patient currently doctors   Dietary issues and exercise activities discussed: Current Exercise Habits: Home exercise routine, Type of exercise: walking, Time (Minutes): 30, Frequency (Times/Week): 3, Weekly Exercise (Minutes/Week): 90, Intensity: Mild  Goals Addressed            This Visit's Progress   . Patient Stated       I would like to stay healthy     . Prevent falls        Depression Screen PHQ 2/9 Scores 01/01/2021 10/31/2020 10/18/2020 07/05/2020 04/16/2020 11/09/2017 07/23/2016  PHQ - 2 Score 0 0 0 0 1 0 0  PHQ- 9 Score - 1 - 2 2 - -    Fall Risk Fall Risk  01/01/2021 10/18/2020 07/05/2020 04/16/2020 11/09/2019  Falls in the past year? 0 0 0 0 0  Comment - - - - -  Number falls in past yr: 0 - - - -  Injury with Fall? 0 - - - -  Risk for fall due to : No Fall Risks - No Fall Risks - -  Follow up Falls evaluation  completed;Falls prevention discussed Falls evaluation completed Falls evaluation completed Falls evaluation completed -    FALL RISK PREVENTION PERTAINING TO THE HOME:  Any stairs in or around the home? No  If so, are there any without handrails? No  Home free of loose throw rugs in walkways, pet beds, electrical cords, etc? Yes  Adequate lighting in your home to reduce risk of falls? Yes   ASSISTIVE DEVICES UTILIZED TO PREVENT FALLS:  Life alert? No  Use of a cane, walker or w/c? No  Grab bars in the bathroom? Yes  Shower chair or bench in shower? Yes  Elevated toilet seat or a handicapped toilet? No     Cognitive Function: MMSE - Mini Mental State Exam 07/11/2020 01/10/2020 10/11/2019  Not completed: - - (No Data)  Orientation to time 4 3 2   Orientation to Place 5 5 5   Registration 3 3 3   Attention/ Calculation 5 5 4   Recall 0 0 0  Language- name 2 objects 2 2 2   Language- repeat 1 1 1   Language- follow 3 step command 3 3 3   Language- read & follow direction 1 1 1   Write a sentence 1 1 1   Copy design 1 1 1   Copy design-comments - 7 animals -  Total score 26 25 23      6CIT Screen 01/01/2021  What Year? 0 points  What month? 0 points  What time? 0 points  Count back from 20 2 points  Months in reverse 0 points  Repeat phrase 10 points  Total Score 12    Immunizations Immunization History  Administered Date(s) Administered  . Fluad Quad(high Dose 65+) 05/11/2020  . Influenza,inj,Quad PF,6+ Mos 04/17/2014, 04/10/2015, 04/13/2017  . Influenza-Unspecified 05/03/2016, 04/05/2018, 04/19/2019  . Moderna Sars-Covid-2 Vaccination 08/16/2019, 09/16/2019  . Pneumococcal Conjugate-13 05/24/2014  . Pneumococcal Polysaccharide-23 11/09/2017  . Td 05/24/2014  .  Zoster, Live 11/10/2015    TDAP status: Up to date  Flu Vaccine status: Up to date  Pneumococcal vaccine status: Up to date  Covid-19 vaccine status: Completed vaccines  Qualifies for Shingles Vaccine? Yes    Zostavax completed Yes   Shingrix Completed?: No.    Education has been provided regarding the importance of this vaccine. Patient has been advised to call insurance company to determine out of pocket expense if they have not yet received this vaccine. Advised may also receive vaccine at local pharmacy or Health Dept. Verbalized acceptance and understanding.  Screening Tests Health Maintenance  Topic Date Due  . Hepatitis C Screening  Never done  . Zoster Vaccines- Shingrix (1 of 2) Never done  . COVID-19 Vaccine (3 - Booster for Moderna series) 02/13/2020  . INFLUENZA VACCINE  03/04/2021  . TETANUS/TDAP  05/24/2024  . DEXA SCAN  Completed  . PNA vac Low Risk Adult  Completed  . HPV VACCINES  Aged Out    Health Maintenance  Health Maintenance Due  Topic Date Due  . Hepatitis C Screening  Never done  . Zoster Vaccines- Shingrix (1 of 2) Never done  . COVID-19 Vaccine (3 - Booster for Moderna series) 02/13/2020    Colorectal cancer screening: No longer required.   Mammogram status: Ordered 01/01/2021. Pt provided with contact info and advised to call to schedule appt.   Bone Density status: Ordered 01/01/2021. Pt provided with contact info and advised to call to schedule appt.  Lung Cancer Screening: (Low Dose CT Chest recommended if Age 25-80 years, 30 pack-year currently smoking OR have quit w/in 15years.) does not qualify.   Lung Cancer Screening Referral: N/A   Additional Screening:  Hepatitis C Screening: does qualify;  Vision Screening: Recommended annual ophthalmology exams for early detection of glaucoma and other disorders of the eye. Is the patient up to date with their annual eye exam?  Yes  Who is the provider or what is the name of the office in which the patient attends annual eye exams? Dr. Delman Cheadle If pt is not established with a provider, would they like to be referred to a provider to establish care? No .   Dental Screening: Recommended annual dental exams  for proper oral hygiene  Community Resource Referral / Chronic Care Management: CRR required this visit?  No   CCM required this visit?  No      Plan:     I have personally reviewed and noted the following in the patient's chart:   . Medical and social history . Use of alcohol, tobacco or illicit drugs  . Current medications and supplements including opioid prescriptions.  . Functional ability and status . Nutritional status . Physical activity . Advanced directives . List of other physicians . Hospitalizations, surgeries, and ER visits in previous 12 months . Vitals . Screenings to include cognitive, depression, and falls . Referrals and appointments  In addition, I have reviewed and discussed with patient certain preventive protocols, quality metrics, and best practice recommendations. A written personalized care plan for preventive services as well as general preventive health recommendations were provided to patient.     Ofilia Neas, LPN   03/21/5630   Nurse Notes: None

## 2021-01-10 ENCOUNTER — Other Ambulatory Visit: Payer: Self-pay | Admitting: Family Medicine

## 2021-01-10 DIAGNOSIS — Z23 Encounter for immunization: Secondary | ICD-10-CM | POA: Diagnosis not present

## 2021-01-14 ENCOUNTER — Other Ambulatory Visit: Payer: Self-pay | Admitting: Family Medicine

## 2021-01-15 ENCOUNTER — Telehealth: Payer: Medicare Other

## 2021-01-15 MED ORDER — ISOSORBIDE MONONITRATE ER 30 MG PO TB24: 30.0000 mg | ORAL_TABLET | Freq: Every day | ORAL | 1 refills | Status: DC

## 2021-01-15 NOTE — Progress Notes (Signed)
PATIENT: Jeanne Rogers DOB: 1941-03-02  REASON FOR VISIT: follow up HISTORY FROM: patient  HISTORY OF PRESENT ILLNESS: Today 01/16/21  HISTORY  Jeanne Rogers is a 80 year old female, seen in request by her primary care physician Dr. Wolfgang Phoenix for evaluation of memory loss, she is accompanied by her husband at today's clinical visit on October 11, 2019.   I have reviewed and summarized the referring note from the referring physician.  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   Around 2019, she was noted to have gradual onset memory loss, she forget conversation easily, difficulty remember telephone number, and people's name.  This often frustrated her, she felt like she could not do what she used to do.  She continue to drive without difficulty, her husband began to take over paying the household bill recently.   Her father suffered dementia in his late 23s.   Today her Mini-Mental Status Examination is 23 out of 30, she missed 3 out of 3 recalls.   I personally reviewed MRI of the brain without contrast on August 25, 2019, mild generalized atrophy, moderate small vessel disease, no acute abnormality   Laboratory evaluations showed normal CBC, TSH, CMP showed no significant abnormality   Update January 10, 2020 SS: Her daughter, Jeanne Rogers, faxed a letter prior to appointment, expressing concern about her mother's irritability and mood, she can be very rude to her husband.  Reports not much appetite, lack of energy, spends most of the day in bed, usually scrolling through Facebook, they are asking to address her  mood, hoping to make caregiving somewhat easier her father.   After last visit, was started on Namenda, tolerating well, seems to have stabilized the memory.  Continues to have repetitive questioning, is easily forgetful, has trouble with telephone #'s, dates, times.  Yesterday, thought her son was 16.  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 Still drives in her town, does well with this.  She still cooks, appetite is fair.  Manages her medications, occasionally misses pills.  Takes Ambien to sleep at night.  Is not very active during the day, has trouble telling me what she actually does during the day.  Here today for follow-up accompanied by her husband.  Update July 11, 2020 SS: Here today for follow-up accompanied by her husband, remains on Celexa and Namenda.  Celexa was added at last visit, has been quite helpful for mood, no longer aggressive, or having frequent unkind verbal remarks, is "kinder" per husband.  She manages her medications, with her husband.  Usually does well with taking.  Memory is overall stable, still trouble with the short-term memory, has some word finding difficulty.  She has a calendar, keeps track of her appointments.  She cooks, does the housework (even Medical sales representative).  Has a good appetite.  Sleeping well, but is out of her Ambien prescription.  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  Has a caregiver who comes a few days a week for a few hours, they go out and  run errands.  Weight is stable.  MMSE 26/30.  Update January 16, 2021 SS: Here with husband, Jeanne Rogers, MMSE 24/30. Does her own ADLs, forgets when to take medications. Keeps a calendar. Sleeping good, husband thinks sleeps a lot during day. Appetite is good, not much cooking anymore. Finished cardiac rehab. Does drive, not getting lost, no longer has a caregiver. Mood is better with Celexa. Hasn't given up any activities  because of memory. Enjoys being involved in state organizations for children well being. Their daughter is coming from [place] in July, looking forward to that.   REVIEW OF SYSTEMS: Out of a complete 14 system review of symptoms, the patient complains only of the following symptoms, and all other reviewed systems are negative.  Memory loss  ALLERGIES: Allergies  Allergen Reactions   Clopidogrel     Pain in thighs    Methergine [Methylergonovine Maleate] Other (See Comments)    Unknown    HOME MEDICATIONS: Outpatient Medications Prior to Visit  Medication Sig Dispense Refill   acetaminophen (TYLENOL) 500 MG tablet Take 1,000 mg by mouth every 6 (six) hours as needed for moderate pain.     alendronate (FOSAMAX) 70 MG tablet Take 1 tablet (70 mg total) by mouth once a week. Take with a full glass of water on an empty stomach. 12 tablet 3   aspirin EC 81 MG tablet Take 81 mg by mouth daily. Swallow whole.     citalopram (CELEXA) 10 MG tablet Take 1 tablet (10 mg total) by mouth daily. 90 tablet 3   ezetimibe (ZETIA) 10 MG tablet Take 1 tablet (10 mg total) by mouth daily. 90 tablet 1   isosorbide mononitrate (IMDUR) 30 MG 24 hr tablet Take 1 tablet (30 mg total) by mouth daily. 90 tablet 1   memantine (NAMENDA) 10 MG tablet Take 1 tablet (10 mg total) by mouth 2 (two) times daily. 60 tablet 11   metoprolol succinate (TOPROL-XL) 25 MG 24 hr tablet Take 0.5 tablets (12.5 mg total) by mouth daily. 90 tablet 3   zolpidem (AMBIEN) 5 MG tablet Take 1 tablet (5 mg total) by mouth at bedtime as needed for sleep. 30 tablet 1   No facility-administered medications prior to visit.    PAST MEDICAL HISTORY: Past Medical History:  Diagnosis Date   Anemia    Collapse, lung    Complication of anesthesia    very sensitive to anesthesia   GERD (gastroesophageal reflux disease)    Hyperlipidemia    Hypertension    Osteoporosis    PONV (postoperative nausea and vomiting)    PUD (peptic ulcer  disease)    Spontaneous pneumothorax     PAST SURGICAL HISTORY: Past Surgical History:  Procedure Laterality Date   COLONOSCOPY N/A 01/04/2014   Procedure: COLONOSCOPY;  Surgeon: Rogene Houston, MD;  [facility];  Service: Endoscopy;  Laterality: N/A;  200   ESOPHAGOGASTRODUODENOSCOPY N/A 01/04/2014   Procedure: ESOPHAGOGASTRODUODENOSCOPY (EGD);  Surgeon: Rogene Houston, MD;  [facility];  Service: Endoscopy;  Laterality: N/A;   hysterectomy     KYPHOPLASTY N/A 08/08/2020   Procedure: KYPHOPLASTY THORACIC EIGHT AND THORACIC TWELVE;  Surgeon: Melina Schools, MD;  Location: [facility];  Service: Orthopedics;  Laterality: N/A;   LEFT HEART CATH AND CORONARY ANGIOGRAPHY N/A 05/10/2020   Procedure: LEFT HEART CATH AND CORONARY ANGIOGRAPHY;  Surgeon: Belva Crome, MD;  Location: [facility];  Service: Cardiovascular;  Laterality: N/A;   MASS EXCISION Right 02/07/2016  Procedure: EXCISION MASS RIGHT INDEX FINGER AND RIGHT HAND MASS;  Surgeon: Leanora Cover, MD;  Location: [facility];  Service: Orthopedics;  Laterality: Right;   OOPHORECTOMY Bilateral 2005    FAMILY HISTORY: Family History  Problem Relation Age of Onset   Hyperlipidemia Mother    Hypertension Father     SOCIAL HISTORY: Social History   Socioeconomic History   Marital status: Married    Spouse name: Not on file   Number of children: Not on file   Years of education: Not on file   Highest education level: Not on file  Occupational History   Not on file  Tobacco Use   Smoking status: Former    Pack years: 0.00    Types: Cigarettes   Smokeless tobacco: Never  Vaping Use   Vaping Use: Never used  Substance and Sexual Activity   Alcohol use: No   Drug use: No   Sexual activity: Not on file  Other Topics Concern   Not on file  Social History Narrative   Not on file   Social Determinants of Health   Financial Resource Strain: Low Risk    Difficulty of Paying Living  Expenses: Not hard at all  Food Insecurity: No Food Insecurity   Worried About Charity fundraiser in the Last Year: Never true   [place] in the Last Year: Never true  Transportation Needs: No Transportation Needs   Lack of Transportation (Medical): No   Lack of Transportation (Non-Medical): No  Physical Activity: Inactive   Days of Exercise per Week: 0 days   Minutes of Exercise per Session: 0 min  Stress: No Stress Concern Present   Feeling of Stress : Not at all  Social Connections: Socially Integrated   Frequency of Communication with Friends and Family: More than three times a week   Frequency of Social Gatherings with Friends and Family: More than three times a week   Attends Religious Services: More than 4 times per year   Active Member of [employer] or Organizations: Yes   Attends Music therapist: More than 4 times per year   Marital Status: Married  Human resources officer Violence: Not At Risk   Fear of Current or Ex-Partner: No   Emotionally Abused: No   Physically Abused: No   Sexually Abused: No   PHYSICAL EXAM  Vitals:   01/16/21 0812  BP: (!) 148/68  Pulse: 62  Weight: 120 lb (54.4 kg)  Height: 5\' 4"  (1.626 m)   Body mass index is 20.6 kg/m.  Generalized: Well developed, in no acute distress  MMSE - Mini Mental State Exam 01/16/2021 07/11/2020 01/10/2020  Not completed: - - -  Orientation to time 4 4 3   Orientation to Place 5 5 5   Registration 3 3 3   Attention/ Calculation 3 5 5   Recall 0 0 0  Language- name 2 objects 2 2 2   Language- repeat 1 1 1   Language- follow 3 step command 3 3 3   Language- read & follow direction 1 1 1   Write a sentence 1 1 1   Copy design 1 1 1   Copy design-comments - - 7 animals  Total score 24 26 25     Neurological examination  Mentation: Alert oriented to time, place, history taking. Follows all commands speech and language fluent Cranial nerve II-XII: Pupils were equal round reactive to light. Extraocular  movements were full, visual field were full on confrontational test. Facial sensation and strength were  normal. Head turning and shoulder shrug  were normal and symmetric. Motor: The motor testing reveals 5 over 5 strength of all 4 extremities. Good symmetric motor tone is noted throughout.  Sensory: Sensory testing is intact to soft touch on all 4 extremities. No evidence of extinction is noted.  Coordination: Cerebellar testing reveals good finger-nose-finger and heel-to-shin bilaterally.  Gait and station: Gait is normal. Reflexes: Deep tendon reflexes are symmetric and normal bilaterally.   DIAGNOSTIC DATA (LABS, IMAGING, TESTING) - I reviewed patient records, labs, notes, testing and imaging myself where available.  Lab Results  Component Value Date   WBC 8.7 08/06/2020   HGB 14.0 08/06/2020   HCT 42.2 08/06/2020   MCV 95.5 08/06/2020   PLT 417 (H) 08/06/2020      Component Value Date/Time   NA 139 08/06/2020 0958   NA 142 11/13/2017 0812   K 3.9 08/06/2020 0958   CL 100 08/06/2020 0958   CO2 28 08/06/2020 0958   GLUCOSE 86 08/06/2020 0958   BUN 20 08/06/2020 0958   BUN 14 11/13/2017 0812   CREATININE 1.05 (H) 08/06/2020 0958   CREATININE 0.92 07/07/2013 0743   CALCIUM 9.5 08/06/2020 0958   PROT 7.0 05/09/2020 2155   PROT 6.3 12/05/2019 1131   ALBUMIN 4.1 05/09/2020 2155   ALBUMIN 4.2 12/05/2019 1131   AST 22 05/09/2020 2155   ALT 15 05/09/2020 2155   ALKPHOS 47 05/09/2020 2155   BILITOT 0.4 05/09/2020 2155   BILITOT 0.3 12/05/2019 1131   GFRNONAA 54 (L) 08/06/2020 0958   GFRAA >60 08/26/2019 0407   Lab Results  Component Value Date   CHOL 231 (H) 05/10/2020   HDL 71 05/10/2020   LDLCALC 139 (H) 05/10/2020   TRIG 106 05/10/2020   CHOLHDL 3.3 05/10/2020   Lab Results  Component Value Date   HGBA1C 5.4 05/10/2020   Lab Results  Component Value Date   VITAMINB12 514 10/11/2019   Lab Results  Component Value Date   TSH 2.521 08/25/2019   ASSESSMENT AND  PLAN 80 y.o. year old female  has a past medical history of Anemia, Collapse, lung, Complication of anesthesia, GERD (gastroesophageal reflux disease), Hyperlipidemia, Hypertension, Osteoporosis, PONV (postoperative nausea and vomiting), PUD (peptic ulcer disease), and Spontaneous pneumothorax. here with:  1.  Mild cognitive impairment  -MMSE today was 24/30, doing well, mood is good with Celexa -Her father suffered dementia in his 38s -MRI of the brain showed mild small vessel disease, generalized atrophy -B12 is normal -Continue Celexa 10 mg daily -Continue Namenda 10 mg twice a day -In regards to Aricept, has history of NSTEMI, EF 65-70 in Jan 2022 EF recovered after NSTEMI, history of Takotsubo cardiomyopathy; probably okay to try Aricept, may would consider cardiac history prior to initiation; right now hold off since overall doing well -Follow-up in 8-12 months or sooner if needed  Evangeline Dakin, DNP 01/16/2021, 8:27 AM [facility] [address] [place], [place] [address] [phone]

## 2021-01-15 NOTE — Telephone Encounter (Signed)
Pt needs refill on isosorbide mononitrate (IMDUR) 30 MG 24 hr tablet pt has tried to get this filled for two weeks and pt is now out of meds and husband is concered for her not having any mediaction.  [id number] - [place], [address]   Pt call back [phone]

## 2021-01-15 NOTE — Telephone Encounter (Signed)
Per note pt should be getting this from her cardiologist. Contacted husband (DPR) to let him know. Upon looking into chart more, cardiology did refill Imdur today. Husband was advised.

## 2021-01-16 ENCOUNTER — Other Ambulatory Visit: Payer: Self-pay

## 2021-01-16 ENCOUNTER — Encounter: Payer: Self-pay | Admitting: Neurology

## 2021-01-16 ENCOUNTER — Ambulatory Visit (INDEPENDENT_AMBULATORY_CARE_PROVIDER_SITE_OTHER): Payer: Medicare Other | Admitting: Neurology

## 2021-01-16 VITALS — BP 148/68 | HR 62 | Ht 64.0 in | Wt 120.0 lb

## 2021-01-16 DIAGNOSIS — G3184 Mild cognitive impairment, so stated: Secondary | ICD-10-CM

## 2021-01-16 MED ORDER — MEMANTINE HCL 10 MG PO TABS: 10.0000 mg | ORAL_TABLET | Freq: Two times a day (BID) | ORAL | 11 refills | Status: DC

## 2021-01-16 MED ORDER — CITALOPRAM HYDROBROMIDE 10 MG PO TABS: 10.0000 mg | ORAL_TABLET | Freq: Every day | ORAL | 3 refills | Status: DC

## 2021-01-16 NOTE — Patient Instructions (Signed)
Continue medications Continue to follow memory overtime See you back in 8-12 months

## 2021-03-04 NOTE — Progress Notes (Signed)
Cardiology Office Note:    Date:  03/11/2021   ID:  Barara Rogers, DOB 1941-03-14, MRN [id number]  PCP:  Erven Colla, DO  [facility] Cardiologist: [place] Electrophysiologist:  None   Referring MD: Erven Colla, DO   Chief Complaint:  No chief complaint on file.    Patient Profile:    Jeanne Rogers is a 80 y.o. female with:  S/p NSTEMI 05/2020 >> Cath: normal coronary arteries  Tako-Tsubo CM Echocardiogram 10/21: EF 30-35 Low BP limited GDMT Recurrent chest pain post DC >> ED >> Nitrates started  Echocardiogram 1/22: EF 65-70 Hypertension  Hyperlipidemia  Spontaneous pneumothorax  Mild dementia   Prior CV studies: Echocardiogram 08/17/20 EF 65-70, no RWMA, mild LVH, Gr 1 DD, normal RVSF  Cardiac catheterization 05/10/20 Right dominant coronary anatomy Widely patent left main Widely patent LAD Widely patent circumflex Widely patent RCA Coronary tortuosity Apical ballooning/Takotsubo cardiomyopathy with LVEDP 6 mmHg.  EF 30 to 35%.  Echocardiogram 05/10/20 EF 30-35, apical lat, ant, inf AK, Gr 1 DD, normal RVSF, RVSP 36.9, small eff   History of Present Illness:    80 y.o. patient new to me   Previously seen by Richardson Dopp and Daneen Schick and referred to local cardiology clinic as she lives in [place]. She has a history of HTN, HLD, spontaneous pneumothorx, mild dementia and Takatsubo DCM. Cath done  05/10/20 normal cors EF 30-35% Echo 08/17/20 showed normalization of EF 60-65% normal RV and no significant valve disease. 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   Evette Cristal has been doing well sleeping better still driving and not getting lost  Daughter visited from [place] last month she is a Technical sales engineer in [place] other children 2 engineers and one ER doctor at [employer] 12 grand children  Husband indicates memory poor but still very  functional  No cardiac symptoms   Past Medical History:  Diagnosis Date   Anemia    Collapse, lung    Complication of anesthesia    very sensitive to anesthesia   GERD (gastroesophageal reflux disease)    Hyperlipidemia    Hypertension    Osteoporosis    PONV (postoperative nausea and vomiting)    PUD (peptic ulcer disease)    Spontaneous pneumothorax     Current Medications: Current Meds  Medication Sig   acetaminophen (TYLENOL) 500 MG tablet Take 1,000 mg by mouth every 6 (six) hours as needed for moderate pain.   alendronate (FOSAMAX) 70 MG tablet Take 1 tablet (70 mg total) by mouth once a week. Take with a full glass of water on an empty stomach.   aspirin EC 81 MG tablet Take 81 mg by mouth daily. Swallow whole.   citalopram (CELEXA) 10 MG tablet Take 1 tablet (10 mg total) by mouth daily.   ezetimibe (ZETIA) 10 MG tablet Take 1 tablet (10 mg total) by mouth daily.   isosorbide mononitrate (IMDUR) 30 MG 24 hr tablet Take 1 tablet (30 mg total) by mouth daily.   memantine (NAMENDA) 10 MG tablet Take 1 tablet (10 mg total) by mouth 2 (two) times daily.   metoprolol succinate (TOPROL-XL) 25 MG 24 hr tablet Take 0.5 tablets (12.5 mg total) by mouth daily.   zolpidem (AMBIEN) 5 MG tablet Take 1 tablet (5 mg total) by mouth at bedtime as needed for sleep.     Allergies:  Clopidogrel and Methergine [methylergonovine maleate]   Social History   Tobacco Use   Smoking status: Former    Types: Cigarettes   Smokeless tobacco: Never  Vaping Use   Vaping Use: Never used  Substance Use Topics   Alcohol use: No   Drug use: No     Family Hx: The patient's family history includes Hyperlipidemia in her mother; Hypertension in her father.  ROS   EKGs/Labs/Other Test Reviewed:    EKG:  SR rate 75 inferolateral T wave changes stable 05/15/20   Recent Labs: 05/09/2020: ALT 15 05/14/2020: B Natriuretic Peptide 436.0 08/06/2020: BUN 20; Creatinine, Ser 1.05; Hemoglobin 14.0;  Platelets 417; Potassium 3.9; Sodium 139   Recent Lipid Panel Lab Results  Component Value Date/Time   CHOL 231 (H) 05/10/2020 10:34 AM   CHOL 253 (H) 11/13/2017 08:12 AM   TRIG 106 05/10/2020 10:34 AM   HDL 71 05/10/2020 10:34 AM   HDL 84 11/13/2017 08:12 AM   CHOLHDL 3.3 05/10/2020 10:34 AM   LDLCALC 139 (H) 05/10/2020 10:34 AM   LDLCALC 157 (H) 11/13/2017 08:12 AM      Risk Assessment/Calculations:      Physical Exam:    VS:  BP 132/76   Pulse 88   Ht '5\' 4"'$  (1.626 m)   Wt 54.9 kg   SpO2 98%   BMI 20.77 kg/m     Wt Readings from Last 3 Encounters:  03/11/21 54.9 kg  01/16/21 54.4 kg  01/01/21 54.1 kg    Affect appropriate Healthy:  appears stated age HEENT: normal Neck supple with no adenopathy JVP normal no bruits no thyromegaly Lungs clear with no wheezing and good diaphragmatic motion Heart:  S1/S2 no murmur, no rub, gallop or click PMI normal Abdomen: benighn, BS positve, no tenderness, no AAA no bruit.  No HSM or HJR Distal pulses intact with no bruits No edema Neuro non-focal Skin warm and dry No muscular weakness     ASSESSMENT & PLAN:    1. Takotsubo cardiomyopathy EF was 30-35 when she presented with her non-STEMI in 10/21.  Her EF has completely recovered to normal.  GDMT was limited by low blood pressure.  Now that her ejection fraction is normal, continue her current therapy with isosorbide, metoprolol succinate. Off ARB/ACE    2. Essential hypertension Well controlled.  Continue current medications and low sodium Dash type diet.     3. Hyperlipidemia LDL goal <100 Continue ezetimibe.  Lipids are managed by primary care.  4. Dementia:  stable MRI no abnormalities seen by Butler Denmark NP 01/16/21 continue namenda and celexa   4. Ortho:  post vertebralplasty  f/u Dr Rolena Infante    Dispo:  F/U as needed   Medication Adjustments/Labs and Tests Ordered: Current medicines are reviewed at length with the patient today.  Concerns regarding  medicines are outlined above.  Tests Ordered: No orders of the defined types were placed in this encounter.  Medication Changes: No orders of the defined types were placed in this encounter.   Signed, Jenkins Rouge, MD  03/11/2021 8:56 AM    [facility] [address], [place], [place]  [address] Phone: [phone]; Fax: [phone]

## 2021-03-11 ENCOUNTER — Ambulatory Visit (INDEPENDENT_AMBULATORY_CARE_PROVIDER_SITE_OTHER): Payer: Medicare Other | Admitting: Cardiovascular Disease

## 2021-03-11 ENCOUNTER — Other Ambulatory Visit: Payer: Self-pay

## 2021-03-11 ENCOUNTER — Encounter: Payer: Self-pay | Admitting: Cardiovascular Disease

## 2021-03-11 VITALS — BP 132/76 | HR 88 | Ht 64.0 in | Wt 121.0 lb

## 2021-03-11 DIAGNOSIS — F0150 Vascular dementia without behavioral disturbance: Secondary | ICD-10-CM | POA: Diagnosis not present

## 2021-03-11 DIAGNOSIS — I10 Essential (primary) hypertension: Secondary | ICD-10-CM

## 2021-03-11 DIAGNOSIS — I5181 Takotsubo syndrome: Secondary | ICD-10-CM | POA: Diagnosis not present

## 2021-03-11 DIAGNOSIS — E782 Mixed hyperlipidemia: Secondary | ICD-10-CM | POA: Diagnosis not present

## 2021-03-11 NOTE — Patient Instructions (Signed)
Medication Instructions:  Your physician recommends that you continue on your current medications as directed. Please refer to the Current Medication list given to you today.  *If you need a refill on your cardiac medications before your next appointment, please call your pharmacy*   Lab Work: NONE   If you have labs (blood work) drawn today and your tests are completely normal, you will receive your results only by: . MyChart Message (if you have MyChart) OR . A paper copy in the mail If you have any lab test that is abnormal or we need to change your treatment, we will call you to review the results.   Testing/Procedures: NONE    Follow-Up: At [facility], you and your health needs are our priority.  As part of our continuing mission to provide you with exceptional heart care, we have created designated Provider Care Teams.  These Care Teams include your primary Cardiologist (physician) and Advanced Practice Providers (APPs -  Physician Assistants and Nurse Practitioners) who all work together to provide you with the care you need, when you need it.  We recommend signing up for the patient portal called "MyChart".  Sign up information is provided on this After Visit Summary.  MyChart is used to connect with patients for Virtual Visits (Telemedicine).  Patients are able to view lab/test results, encounter notes, upcoming appointments, etc.  Non-urgent messages can be sent to your provider as well.   To learn more about what you can do with MyChart, go to [email or web address].com.    Your next appointment:   1 year(s)  The format for your next appointment:   In Person  Provider:   Peter Nishan, MD   Other Instructions Thank you for choosing [facility]!    

## 2021-05-06 ENCOUNTER — Other Ambulatory Visit: Payer: Self-pay | Admitting: Nurse Practitioner

## 2021-05-06 ENCOUNTER — Other Ambulatory Visit: Payer: Self-pay | Admitting: Family Medicine

## 2021-05-06 DIAGNOSIS — E785 Hyperlipidemia, unspecified: Secondary | ICD-10-CM

## 2021-07-06 NOTE — Progress Notes (Signed)
Chart reviewed, agree above plan ?

## 2021-07-11 DIAGNOSIS — Z23 Encounter for immunization: Secondary | ICD-10-CM | POA: Diagnosis not present

## 2021-09-10 ENCOUNTER — Telehealth: Payer: Self-pay | Admitting: Family Medicine

## 2021-09-10 ENCOUNTER — Other Ambulatory Visit: Payer: Self-pay | Admitting: Physician Assistant

## 2021-09-10 DIAGNOSIS — Z79899 Other long term (current) drug therapy: Secondary | ICD-10-CM

## 2021-09-10 DIAGNOSIS — E785 Hyperlipidemia, unspecified: Secondary | ICD-10-CM

## 2021-09-10 DIAGNOSIS — I10 Essential (primary) hypertension: Secondary | ICD-10-CM

## 2021-09-10 NOTE — Telephone Encounter (Signed)
Labs ordered in epic

## 2021-09-10 NOTE — Telephone Encounter (Signed)
LMTRC

## 2021-09-10 NOTE — Telephone Encounter (Signed)
Last labs ordered by provider 11/13/17 CBC, Met7, liver and lipid.

## 2021-09-10 NOTE — Telephone Encounter (Signed)
Patient has physical on 2/22 and needing labs done.

## 2021-09-10 NOTE — Telephone Encounter (Signed)
This is a [place] pt.  °

## 2021-09-18 ENCOUNTER — Ambulatory Visit (INDEPENDENT_AMBULATORY_CARE_PROVIDER_SITE_OTHER): Payer: Medicare Other | Admitting: Neurology

## 2021-09-18 ENCOUNTER — Other Ambulatory Visit: Payer: Self-pay

## 2021-09-18 ENCOUNTER — Encounter: Payer: Self-pay | Admitting: Neurology

## 2021-09-18 VITALS — BP 145/72 | HR 84 | Ht 64.0 in | Wt 121.0 lb

## 2021-09-18 DIAGNOSIS — G3184 Mild cognitive impairment, so stated: Secondary | ICD-10-CM

## 2021-09-18 MED ORDER — MEMANTINE HCL 10 MG PO TABS: 10.0000 mg | ORAL_TABLET | Freq: Two times a day (BID) | ORAL | 11 refills | Status: DC

## 2021-09-18 MED ORDER — DONEPEZIL HCL 5 MG PO TABS: 5.0000 mg | ORAL_TABLET | Freq: Every day | ORAL | 5 refills | Status: DC

## 2021-09-18 MED ORDER — CITALOPRAM HYDROBROMIDE 10 MG PO TABS: 10.0000 mg | ORAL_TABLET | Freq: Every day | ORAL | 3 refills | Status: DC

## 2021-09-18 NOTE — Progress Notes (Signed)
PATIENT: Jeanne Rogers DOB: [birth year]  REASON FOR VISIT: follow up for memory  HISTORY FROM: patient PRIMARY NEUROLOGIST: Dr. Krista Blue   HISTORY  Jeanne Rogers is a 81 year old female, seen in request by her primary care physician Dr. Wolfgang Phoenix for evaluation of memory loss, she is accompanied by her husband at today's clinical visit on October 11, 2019.   I have reviewed and summarized the referring note from the referring physician.  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   Around 2019, she was noted to have gradual onset memory loss, she forget conversation easily, difficulty remember telephone number, and people's name.  This often frustrated her, she felt like she could not do what she used to do.  She continue to drive without difficulty, her husband began to take over paying the household bill recently.   Her father suffered dementia in his late 68s.   Today her Mini-Mental Status Examination is 23 out of 30, she missed 3 out of 3 recalls.   I personally reviewed MRI of the brain without contrast on August 25, 2019, mild generalized atrophy, moderate small vessel disease, no acute abnormality   Laboratory evaluations showed normal CBC, TSH, CMP showed no significant abnormality   Update January 10, 2020 SS: Her daughter, Jeanne Rogers, faxed a letter prior to appointment, expressing concern about her mother's irritability and mood, she can be very rude to her husband.  Reports not much appetite, lack of energy, spends most of the day in bed, usually scrolling through Facebook, they are asking to address her mood,  hoping to make caregiving somewhat easier her father.   After last visit, was started on Namenda, tolerating well, seems to have stabilized the memory.  Continues to have repetitive questioning, is easily forgetful, has trouble with telephone #'s, dates, times.  Yesterday, thought her son was 68.  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 Still drives in her town, does well with this.  She still cooks, appetite is fair.  Manages her medications, occasionally misses pills.  Takes Ambien to sleep at night.  Is not very active during the day, has trouble telling me what she actually does during the day.  Here today for follow-up accompanied by her husband.  Update July 11, 2020 SS: Here today for follow-up accompanied by her husband, remains on Celexa and Namenda.  Celexa was added at last visit, has been quite helpful for mood, no longer aggressive, or having frequent unkind verbal remarks, is "kinder" per husband.  She manages her medications, with her husband.  Usually does well with taking.  Memory is overall stable, still trouble with the short-term memory, has some word finding difficulty.  She has a calendar, keeps track of her appointments.  She cooks, does the housework (even Medical sales representative).  Has a good appetite.  Sleeping well, but is out of her Ambien prescription.  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  Has a caregiver who comes a few days a week for a few hours, they go out  and run errands.  Weight is stable.  MMSE 26/30.  Update January 16, 2021 SS: Here with husband, [name], MMSE 24/30. Does her own ADLs, forgets when to take medications. Keeps a calendar. Sleeping good, husband thinks sleeps a lot during day. Appetite is good, not much cooking anymore. Finished cardiac rehab. Does drive, not getting lost, no longer has a caregiver. Mood is better with Celexa. Hasn't given up any activities because of  memory. Enjoys being involved in state organizations for children well being. Their daughter is coming from [place] in July, looking forward to that.   Update September 18, 2021 SS: MMSE 22/30 today, here today with her husband, feels memory is stable, short term memory is poor, up and down at night, lays in the bed during the day, drives short distances alone, drove here today with her husband, husband does medications. Plays card game weekly with friends. Husband does most of the housework. Her mood is good on Celexa, interested in Aricept.   REVIEW OF SYSTEMS: Out of a complete 14 system review of symptoms, the patient complains only of the following symptoms, and all other reviewed systems are negative.  See HPI  ALLERGIES: Allergies  Allergen Reactions   Clopidogrel     Pain in thighs    Methergine [Methylergonovine Maleate] Other (See Comments)    Unknown    HOME MEDICATIONS: Outpatient Medications Prior to Visit  Medication Sig Dispense Refill   acetaminophen (TYLENOL) 500 MG tablet Take 1,000 mg by mouth every 6 (six) hours as needed for moderate pain.     alendronate (FOSAMAX) 70 MG tablet Take 1 tablet (70 mg total) by mouth once a week. Take with a full glass of water on an empty stomach. 12 tablet 3   aspirin EC 81 MG tablet Take 81 mg by mouth daily. Swallow whole.     ezetimibe (ZETIA) 10 MG tablet TAKE 1 TABLET(10 MG) BY MOUTH DAILY 30 tablet 0   isosorbide mononitrate (IMDUR) 30 MG 24 hr tablet TAKE 1 TABLET(30 MG) BY MOUTH DAILY 90 tablet 1   metoprolol succinate (TOPROL-XL) 25 MG 24 hr tablet Take 0.5 tablets (12.5 mg total) by mouth daily. 90 tablet 3   citalopram (CELEXA) 10 MG tablet Take 1 tablet (10 mg total) by mouth daily. 90 tablet 3   memantine (NAMENDA) 10 MG tablet Take 1 tablet (10 mg total) by mouth 2 (two) times daily. 60 tablet 11   zolpidem (AMBIEN) 5 MG tablet Take 1 tablet (5 mg total) by mouth at bedtime as needed for sleep. 30 tablet 1   No  facility-administered medications prior to visit.    PAST MEDICAL HISTORY: Past Medical History:  Diagnosis Date   Anemia    Collapse, lung    Complication of anesthesia    very sensitive to anesthesia   GERD (gastroesophageal reflux disease)    Hyperlipidemia    Hypertension    Osteoporosis    PONV (postoperative nausea and vomiting)    PUD (peptic ulcer disease)    Spontaneous pneumothorax     PAST SURGICAL HISTORY: Past Surgical History:  Procedure Laterality Date   COLONOSCOPY N/A 01/04/2014   Procedure: COLONOSCOPY;  Surgeon: Rogene Houston, MD;  [facility];  Service: Endoscopy;  Laterality: N/A;  200   ESOPHAGOGASTRODUODENOSCOPY N/A 01/04/2014   Procedure: ESOPHAGOGASTRODUODENOSCOPY (EGD);  Surgeon: Rogene Houston, MD;  [facility];  Service: Endoscopy;  Laterality: N/A;   hysterectomy     KYPHOPLASTY N/A 08/08/2020  Procedure: KYPHOPLASTY THORACIC EIGHT AND THORACIC TWELVE;  Surgeon: Melina Schools, MD;  Location: [facility];  Service: Orthopedics;  Laterality: N/A;   LEFT HEART CATH AND CORONARY ANGIOGRAPHY N/A 05/10/2020   Procedure: LEFT HEART CATH AND CORONARY ANGIOGRAPHY;  Surgeon: Belva Crome, MD;  Location: [facility];  Service: Cardiovascular;  Laterality: N/A;   MASS EXCISION Right 02/07/2016   Procedure: EXCISION MASS RIGHT INDEX FINGER AND RIGHT HAND MASS;  Surgeon: Leanora Cover, MD;  Location: [facility];  Service: Orthopedics;  Laterality: Right;   OOPHORECTOMY Bilateral 2005    FAMILY HISTORY: Family History  Problem Relation Age of Onset   Hyperlipidemia Mother    Hypertension Father     SOCIAL HISTORY: Social History   Socioeconomic History   Marital status: Married    Spouse name: Not on file   Number of children: Not on file   Years of education: Not on file   Highest education level: Not on file  Occupational History   Not on file  Tobacco Use   Smoking status: Former    Types: Cigarettes    Smokeless tobacco: Never  Vaping Use   Vaping Use: Never used  Substance and Sexual Activity   Alcohol use: No   Drug use: No   Sexual activity: Not on file  Other Topics Concern   Not on file  Social History Narrative   Not on file   Social Determinants of Health   Financial Resource Strain: Low Risk    Difficulty of Paying Living Expenses: Not hard at all  Food Insecurity: No Food Insecurity   Worried About Charity fundraiser in the Last Year: Never true   [place] in the Last Year: Never true  Transportation Needs: No Transportation Needs   Lack of Transportation (Medical): No   Lack of Transportation (Non-Medical): No  Physical Activity: Inactive   Days of Exercise per Week: 0 days   Minutes of Exercise per Session: 0 min  Stress: No Stress Concern Present   Feeling of Stress : Not at all  Social Connections: Socially Integrated   Frequency of Communication with Friends and Family: More than three times a week   Frequency of Social Gatherings with Friends and Family: More than three times a week   Attends Religious Services: More than 4 times per year   Active Member of [employer] or Organizations: Yes   Attends Music therapist: More than 4 times per year   Marital Status: Married  Human resources officer Violence: Not At Risk   Fear of Current or Ex-Partner: No   Emotionally Abused: No   Physically Abused: No   Sexually Abused: No   PHYSICAL EXAM  Vitals:   09/18/21 0857  BP: (!) 145/72  Pulse: 84  Weight: 121 lb (54.9 kg)  Height: 5\' 4"  (1.626 m)    Body mass index is 20.77 kg/m.  Generalized: Well developed, in no acute distress  MMSE - Mini Mental State Exam 09/18/2021 01/16/2021 07/11/2020  Not completed: - - -  Orientation to time 1 4 4   Orientation to Place 5 5 5   Registration 3 3 3   Attention/ Calculation 4 3 5   Recall 0 0 0  Language- name 2 objects 2 2 2   Language- repeat 1 1 1   Language- follow 3 step command 3 3 3   Language- read  & follow direction 1 1 1   Write a sentence 1 1 1   Copy design 1  1 1  Copy design-comments - - -  Total score 22 24 26     Neurological examination  Mentation: Alert oriented to time, place, history taking. Follows all commands speech and language fluent, cooperative, participatory Cranial nerve II-XII: Pupils were equal round reactive to light. Extraocular movements were full, visual field were full on confrontational test. Facial sensation and strength were normal. Head turning and shoulder shrug  were normal and symmetric. Motor: The motor testing reveals 5 over 5 strength of all 4 extremities. Good symmetric motor tone is noted throughout.  Sensory: Sensory testing is intact to soft touch on all 4 extremities. No evidence of extinction is noted.  Coordination: Cerebellar testing reveals good finger-nose-finger and heel-to-shin bilaterally.  Gait and station: Gait is normal. Reflexes: Deep tendon reflexes are symmetric and normal bilaterally.   DIAGNOSTIC DATA (LABS, IMAGING, TESTING) - I reviewed patient records, labs, notes, testing and imaging myself where available.  Lab Results  Component Value Date   WBC 8.7 08/06/2020   HGB 14.0 08/06/2020   HCT 42.2 08/06/2020   MCV 95.5 08/06/2020   PLT 417 (H) 08/06/2020      Component Value Date/Time   NA 139 08/06/2020 0958   NA 142 11/13/2017 0812   K 3.9 08/06/2020 0958   CL 100 08/06/2020 0958   CO2 28 08/06/2020 0958   GLUCOSE 86 08/06/2020 0958   BUN 20 08/06/2020 0958   BUN 14 11/13/2017 0812   CREATININE 1.05 (H) 08/06/2020 0958   CREATININE 0.92 07/07/2013 0743   CALCIUM 9.5 08/06/2020 0958   PROT 7.0 05/09/2020 2155   PROT 6.3 12/05/2019 1131   ALBUMIN 4.1 05/09/2020 2155   ALBUMIN 4.2 12/05/2019 1131   AST 22 05/09/2020 2155   ALT 15 05/09/2020 2155   ALKPHOS 47 05/09/2020 2155   BILITOT 0.4 05/09/2020 2155   BILITOT 0.3 12/05/2019 1131   GFRNONAA 54 (L) 08/06/2020 0958   GFRAA >60 08/26/2019 0407   Lab  Results  Component Value Date   CHOL 231 (H) 05/10/2020   HDL 71 05/10/2020   LDLCALC 139 (H) 05/10/2020   TRIG 106 05/10/2020   CHOLHDL 3.3 05/10/2020   Lab Results  Component Value Date   HGBA1C 5.4 05/10/2020   Lab Results  Component Value Date   VITAMINB12 514 10/11/2019   Lab Results  Component Value Date   TSH 2.521 08/25/2019   ASSESSMENT AND PLAN 81 y.o. year old female  has a past medical history of Anemia, Collapse, lung, Complication of anesthesia, GERD (gastroesophageal reflux disease), Hyperlipidemia, Hypertension, Osteoporosis, PONV (postoperative nausea and vomiting), PUD (peptic ulcer disease), and Spontaneous pneumothorax. here with:  1.  Mild cognitive impairment -Nell is overall doing well, continues to with gradual memory decline, MMSE 22/30 today -Add on Aricept 5 mg at bedtime, continue Namenda 10 mg twice daily, continue Celexa 10 mg daily for the mood -Her father suffered dementia in his 47s -MRI of the brain showed mild small vessel disease, generalized atrophy -B12 is normal -Continue Celexa 10 mg daily -Continue Namenda 10 mg twice a day -Would recommend close supervision of driving -Follow-up here in 6 months or sooner if needed  Evangeline Dakin, DNP 09/18/2021, 9:37 AM [facility] [address], [place],  [address] [phone]

## 2021-09-18 NOTE — Patient Instructions (Signed)
Add on Aricept 5 mg at bedtime for memory Continue Namenda, Celexa  Return back in 6 months

## 2021-09-20 ENCOUNTER — Other Ambulatory Visit: Payer: Self-pay | Admitting: Medical

## 2021-09-20 NOTE — Telephone Encounter (Signed)
This is a [place] pt.  °

## 2021-09-25 ENCOUNTER — Ambulatory Visit (INDEPENDENT_AMBULATORY_CARE_PROVIDER_SITE_OTHER): Payer: Medicare Other | Admitting: Family Medicine

## 2021-09-25 ENCOUNTER — Other Ambulatory Visit: Payer: Self-pay | Admitting: Nurse Practitioner

## 2021-09-25 ENCOUNTER — Other Ambulatory Visit: Payer: Self-pay

## 2021-09-25 VITALS — BP 145/79 | HR 87 | Temp 97.4°F | Ht 64.0 in | Wt 122.0 lb

## 2021-09-25 DIAGNOSIS — M818 Other osteoporosis without current pathological fracture: Secondary | ICD-10-CM

## 2021-09-25 DIAGNOSIS — G3184 Mild cognitive impairment, so stated: Secondary | ICD-10-CM | POA: Diagnosis not present

## 2021-09-25 DIAGNOSIS — D75839 Thrombocytosis, unspecified: Secondary | ICD-10-CM

## 2021-09-25 DIAGNOSIS — I5181 Takotsubo syndrome: Secondary | ICD-10-CM | POA: Diagnosis not present

## 2021-09-25 DIAGNOSIS — E785 Hyperlipidemia, unspecified: Secondary | ICD-10-CM

## 2021-09-25 DIAGNOSIS — Z0000 Encounter for general adult medical examination without abnormal findings: Secondary | ICD-10-CM

## 2021-09-25 DIAGNOSIS — Z1159 Encounter for screening for other viral diseases: Secondary | ICD-10-CM

## 2021-09-25 MED ORDER — ALENDRONATE SODIUM 70 MG PO TABS
70.0000 mg | ORAL_TABLET | ORAL | 3 refills | Status: DC
Start: 2021-09-25 — End: 2022-03-19

## 2021-09-25 MED ORDER — EZETIMIBE 10 MG PO TABS: 10.0000 mg | ORAL_TABLET | Freq: Every day | ORAL | 3 refills | Status: DC

## 2021-09-25 NOTE — Progress Notes (Signed)
Subjective:   Jeanne Rogers is a 81 y.o. female who presents for Medicare Annual (Subsequent) preventive examination.  Patient states that she is doing well.  She has no complaints or concerns at this time.  Review of Systems    No chest pain or SOB. No constipation. Reports urinary frequency. No difficulties hearing or difficulties with her vision.       Objective:    Today's Vitals   09/25/21 1331  BP: (!) 145/79  Pulse: 87  Temp: (!) 97.4 F (36.3 C)  SpO2: 99%  Weight: 122 lb (55.3 kg)  Height: 5\' 4"  (1.626 m)   Body mass index is 20.94 kg/m. Physical Exam   Gen.: Well-appearing elderly female in no acute distress. HENT: Normocephalic atraumatic. Normal TMs bilaterally Eyes: No scleral icterus. Normal conjunctiva. Neck: Supple. No thyromegaly or adenopathy. Heart: Regular rate and rhythm. No murmurs appreciated. No lower extremity edema. Lungs: Clear auscultation bilaterally. No rales, rhonchi, or wheezing. Abdomen: Soft, nontender, nondistended. No palpable organomegaly. No rebound or guarding. MSK: Normal range of motion. Neuro: No focal deficits. Psych: Normal mood and affect.  Advanced Directives 01/01/2021 08/06/2020 07/05/2020 05/11/2020 05/10/2020 05/09/2020 08/25/2019  Does Patient Have a Medical Advance Directive? No No No No No Yes No  Type of Advance Directive - - - - - Press photographer;Living will -  Does patient want to make changes to medical advance directive? - - - - - - -  Would patient like information on creating a medical advance directive? No - Patient declined - No - Patient declined No - Patient declined Yes (Inpatient - patient requests chaplain consult to create a medical advance directive) - No - Patient declined    Current Medications (verified) Outpatient Encounter Medications as of 09/25/2021  Medication Sig   acetaminophen (TYLENOL) 500 MG tablet Take 1,000 mg by mouth every 6 (six) hours as needed for moderate pain.    aspirin EC 81 MG tablet Take 81 mg by mouth daily. Swallow whole.   citalopram (CELEXA) 10 MG tablet Take 1 tablet (10 mg total) by mouth daily.   donepezil (ARICEPT) 5 MG tablet Take 1 tablet (5 mg total) by mouth at bedtime.   isosorbide mononitrate (IMDUR) 30 MG 24 hr tablet TAKE 1 TABLET(30 MG) BY MOUTH DAILY   memantine (NAMENDA) 10 MG tablet Take 1 tablet (10 mg total) by mouth 2 (two) times daily.   metoprolol succinate (TOPROL-XL) 25 MG 24 hr tablet TAKE 1/2 TABLET BY MOUTH DAILY   [DISCONTINUED] alendronate (FOSAMAX) 70 MG tablet Take 1 tablet (70 mg total) by mouth once a week. Take with a full glass of water on an empty stomach.   [DISCONTINUED] ezetimibe (ZETIA) 10 MG tablet TAKE 1 TABLET(10 MG) BY MOUTH DAILY   alendronate (FOSAMAX) 70 MG tablet Take 1 tablet (70 mg total) by mouth once a week. Take with a full glass of water on an empty stomach.   ezetimibe (ZETIA) 10 MG tablet Take 1 tablet (10 mg total) by mouth daily.   No facility-administered encounter medications on file as of 09/25/2021.    Allergies (verified) Clopidogrel, Methergine [methylergonovine maleate], and Statins   History: Past Medical History:  Diagnosis Date   Anemia    Collapse, lung    Complication of anesthesia    very sensitive to anesthesia   GERD (gastroesophageal reflux disease)    Hyperlipidemia    Hypertension    Osteoporosis    PONV (postoperative nausea and vomiting)  PUD (peptic ulcer disease)    Spontaneous pneumothorax    Past Surgical History:  Procedure Laterality Date   COLONOSCOPY N/A 01/04/2014   Procedure: COLONOSCOPY;  Surgeon: Rogene Houston, MD;  Location: [facility];  Service: Endoscopy;  Laterality: N/A;  200   ESOPHAGOGASTRODUODENOSCOPY N/A 01/04/2014   Procedure: ESOPHAGOGASTRODUODENOSCOPY (EGD);  Surgeon: Rogene Houston, MD;  Location: [facility];  Service: Endoscopy;  Laterality: N/A;   hysterectomy     KYPHOPLASTY N/A 08/08/2020   Procedure: KYPHOPLASTY  THORACIC EIGHT AND THORACIC TWELVE;  Surgeon: Melina Schools, MD;  Location: [facility];  Service: Orthopedics;  Laterality: N/A;   LEFT HEART CATH AND CORONARY ANGIOGRAPHY N/A 05/10/2020   Procedure: LEFT HEART CATH AND CORONARY ANGIOGRAPHY;  Surgeon: Belva Crome, MD;  Location: [facility];  Service: Cardiovascular;  Laterality: N/A;   MASS EXCISION Right 02/07/2016   Procedure: EXCISION MASS RIGHT INDEX FINGER AND RIGHT HAND MASS;  Surgeon: Leanora Cover, MD;  Location: [facility];  Service: Orthopedics;  Laterality: Right;   OOPHORECTOMY Bilateral 2005   Family History  Problem Relation Age of Onset   Hyperlipidemia Mother    Hypertension Father    Social History   Socioeconomic History   Marital status: Married    Spouse name: Not on file   Number of children: Not on file   Years of education: Not on file   Highest education level: Not on file  Occupational History   Not on file  Tobacco Use   Smoking status: Former    Types: Cigarettes   Smokeless tobacco: Never  Vaping Use   Vaping Use: Never used  Substance and Sexual Activity   Alcohol use: No   Drug use: No   Sexual activity: Not on file  Other Topics Concern   Not on file  Social History Narrative   Not on file   Social Determinants of Health   Financial Resource Strain: Low Risk    Difficulty of Paying Living Expenses: Not hard at all  Food Insecurity: No Food Insecurity   Worried About Charity fundraiser in the Last Year: Never true   [place] in the Last Year: Never true  Transportation Needs: No Transportation Needs   Lack of Transportation (Medical): No   Lack of Transportation (Non-Medical): No  Physical Activity: Inactive   Days of Exercise per Week: 0 days   Minutes of Exercise per Session: 0 min  Stress: No Stress Concern Present   Feeling of Stress : Not at all  Social Connections: Socially Integrated   Frequency of Communication with Friends and Family: More than three  times a week   Frequency of Social Gatherings with Friends and Family: More than three times a week   Attends Religious Services: More than 4 times per year   Active Member of [employer] or Organizations: Yes   Attends Music therapist: More than 4 times per year   Marital Status: Married    Tobacco Counseling Former smoker.  Activities of Daily Living In your present state of health, do you have any difficulty performing the following activities: 01/01/2021  Hearing? N  Vision? N  Difficulty concentrating or making decisions? Y  Walking or climbing stairs? N  Dressing or bathing? N  Doing errands, shopping? N  Preparing Food and eating ? N  Using the Toilet? N  In the past six months, have you accidently leaked urine? N  Do you have  problems with loss of bowel control? N  Managing your Medications? N  Managing your Finances? N  Housekeeping or managing your Housekeeping? N  Some recent data might be hidden    Patient Care Team: Coral Spikes, DO as PCP - General (Family Medicine)     Assessment:   This is a routine wellness examination for Jeanne Rogers.  Hearing/Vision screen Wears glasses. No known difficulties with her vision or with her hearing.  Depression Screen PHQ 2/9 Scores 09/25/2021 01/01/2021 10/31/2020 10/18/2020 07/05/2020 04/16/2020 11/09/2017  PHQ - 2 Score 0 0 0 0 0 1 0  PHQ- 9 Score - - 1 - 2 2 -    Fall Risk Fall Risk  09/25/2021 01/01/2021 10/18/2020 07/05/2020 04/16/2020  Falls in the past year? 0 0 0 0 0  Comment - - - - -  Number falls in past yr: 0 0 - - -  Injury with Fall? 0 0 - - -  Risk for fall due to : No Fall Risks No Fall Risks - No Fall Risks -  Follow up Falls evaluation completed Falls evaluation completed;Falls prevention discussed Falls evaluation completed Falls evaluation completed Falls evaluation completed    FALL RISK PREVENTION PERTAINING TO THE HOME:  Patient is fully ambulatory and does not use assistive devices.  Not an  increased risk for falls at this time.  Cognitive Function: MMSE - Mini Mental State Exam 09/18/2021 01/16/2021 07/11/2020 01/10/2020 10/11/2019  Not completed: - - - - (No Data)  Orientation to time 1 4 4 3 2   Orientation to Place 5 5 5 5 5   Registration 3 3 3 3 3   Attention/ Calculation 4 3 5 5 4   Recall 0 0 0 0 0  Language- name 2 objects 2 2 2 2 2   Language- repeat 1 1 1 1 1   Language- follow 3 step command 3 3 3 3 3   Language- read & follow direction 1 1 1 1 1   Write a sentence 1 1 1 1 1   Copy design 1 1 1 1 1   Copy design-comments - - - 7 animals -  Total score 22 24 26 25 23      6CIT Screen 01/01/2021  What Year? 0 points  What month? 0 points  What time? 0 points  Count back from 20 2 points  Months in reverse 0 points  Repeat phrase 10 points  Total Score 12    Immunizations Immunization History  Administered Date(s) Administered   Fluad Quad(high Dose 65+) 05/11/2020   Influenza,inj,Quad PF,6+ Mos 04/17/2014, 04/10/2015, 04/13/2017   Influenza-Unspecified 05/03/2016, 04/05/2018, 04/19/2019, 06/04/2021   Moderna Sars-Covid-2 Vaccination 08/16/2019, 09/16/2019   Pneumococcal Conjugate-13 05/24/2014   Pneumococcal Polysaccharide-23 11/09/2017   Td 05/24/2014   Zoster, Live 11/10/2015    TDAP status: Up to date  Flu Vaccine status: Up to date  Pneumococcal vaccine status: Up to date  Qualifies for Shingles Vaccine? Yes   Zostavax completed Yes   Shingrix Completed?: No.    Education has been provided regarding the importance of this vaccine. Patient has been advised to call insurance company to determine out of pocket expense if they have not yet received this vaccine. Advised may also receive vaccine at local pharmacy or Health Dept. Verbalized acceptance and understanding.  Screening Tests Health Maintenance  Topic Date Due   Zoster Vaccines- Shingrix (1 of 2) Never done   COVID-19 Vaccine (3 - Moderna risk series) 10/14/2019   TETANUS/TDAP  05/24/2024    Pneumonia  Vaccine 26+ Years old  Completed   INFLUENZA VACCINE  Completed   DEXA SCAN  Completed   Hepatitis C Screening  Completed   HPV VACCINES  Aged Out    Health Maintenance  Health Maintenance Due  Topic Date Due   Zoster Vaccines- Shingrix (1 of 2) Never done   COVID-19 Vaccine (3 - Moderna risk series) 10/14/2019    Colorectal cancer screening: Last done in 2015.  USPSTF now recommends selective screening from age 67-85.  Mammogram -patient is overdue.  Advised to call hospital to schedule.  Bone density status -patient is overdue for repeat DEXA scan.  She has documented osteoporosis and is on treatment.  Additional Screening:  Hepatitis C Screening: does qualify; Completed today.  Vision Screening: Advised annual screening.  Dental Screening: Recommended annual dental exams for proper oral hygiene  Plan:     I have personally reviewed and noted the following in the patients chart:   Medical and social history Use of alcohol, tobacco or illicit drugs  Current medications and supplements including opioid prescriptions.  Functional ability and status Nutritional status Physical activity Advanced directives List of other physicians Hospitalizations, surgeries, and ER visits in previous 12 months Vitals Screenings to include cognitive, depression, and falls Referrals and appointments  In addition, I have reviewed and discussed with patient certain preventive protocols, quality metrics, and best practice recommendations. General preventive health recommendations were provided to patient.    Coral Spikes, DO   09/25/2021

## 2021-09-25 NOTE — Patient Instructions (Signed)
Labs ordered.  Bone density order.  Call and schedule mammogram.  Follow up in 6 months.  Take care  Dr. Lacinda Axon

## 2021-09-26 LAB — LIPID PANEL
Chol/HDL Ratio: 3.2 ratio (ref 0.0–4.4)
Cholesterol, Total: 242 mg/dL — ABNORMAL HIGH (ref 100–199)
HDL: 76 mg/dL (ref >39)
LDL Chol Calc (NIH): 143 mg/dL — ABNORMAL HIGH (ref 0–99)
Triglycerides: 131 mg/dL (ref 0–149)
VLDL Cholesterol Cal: 23 mg/dL (ref 5–40)

## 2021-09-26 LAB — CMP14+EGFR
ALT: 13 IU/L (ref 0–32)
AST: 21 IU/L (ref 0–40)
Albumin/Globulin Ratio: 2.2 (ref 1.2–2.2)
Albumin: 4.3 g/dL (ref 3.7–4.7)
Alkaline Phosphatase: 54 IU/L (ref 44–121)
BUN/Creatinine Ratio: 20 (ref 12–28)
BUN: 25 mg/dL (ref 8–27)
Bilirubin Total: <0.2 mg/dL (ref 0.0–1.2)
CO2: 25 mmol/L (ref 20–29)
Calcium: 9.6 mg/dL (ref 8.7–10.3)
Chloride: 103 mmol/L (ref 96–106)
Creatinine, Ser: 1.25 mg/dL — ABNORMAL HIGH (ref 0.57–1.00)
Globulin, Total: 2 g/dL (ref 1.5–4.5)
Glucose: 100 mg/dL — ABNORMAL HIGH (ref 70–99)
Potassium: 4.4 mmol/L (ref 3.5–5.2)
Sodium: 141 mmol/L (ref 134–144)
Total Protein: 6.3 g/dL (ref 6.0–8.5)
eGFR: 44 mL/min/{1.73_m2} — ABNORMAL LOW (ref >59)

## 2021-09-26 LAB — HEPATITIS C ANTIBODY: Hep C Virus Ab: NONREACTIVE

## 2021-09-26 LAB — CBC
Hematocrit: 34.5 % (ref 34.0–46.6)
Hemoglobin: 10.4 g/dL — ABNORMAL LOW (ref 11.1–15.9)
MCH: 27.8 pg (ref 26.6–33.0)
MCHC: 30.1 g/dL — ABNORMAL LOW (ref 31.5–35.7)
MCV: 92 fL (ref 79–97)
Platelets: 402 10*3/uL (ref 150–450)
RBC: 3.74 x10E6/uL — ABNORMAL LOW (ref 3.77–5.28)
RDW: 14.7 % (ref 11.7–15.4)
WBC: 6.4 10*3/uL (ref 3.4–10.8)

## 2021-09-26 LAB — VITAMIN D 25 HYDROXY (VIT D DEFICIENCY, FRACTURES): Vit D, 25-Hydroxy: 37.3 ng/mL (ref 30.0–100.0)

## 2021-09-27 ENCOUNTER — Other Ambulatory Visit: Payer: Self-pay | Admitting: Medical

## 2021-10-13 IMAGING — CR DG CHEST 2V
2 series · 2 of 2 positions shown · non-contrast
Comparison: Chest x-ray 05/14/2020.

CLINICAL DATA: 79-year-old female under preoperative evaluation
prior to spinal surgery.

EXAM:
CHEST - 2 VIEW

[w chest pa]
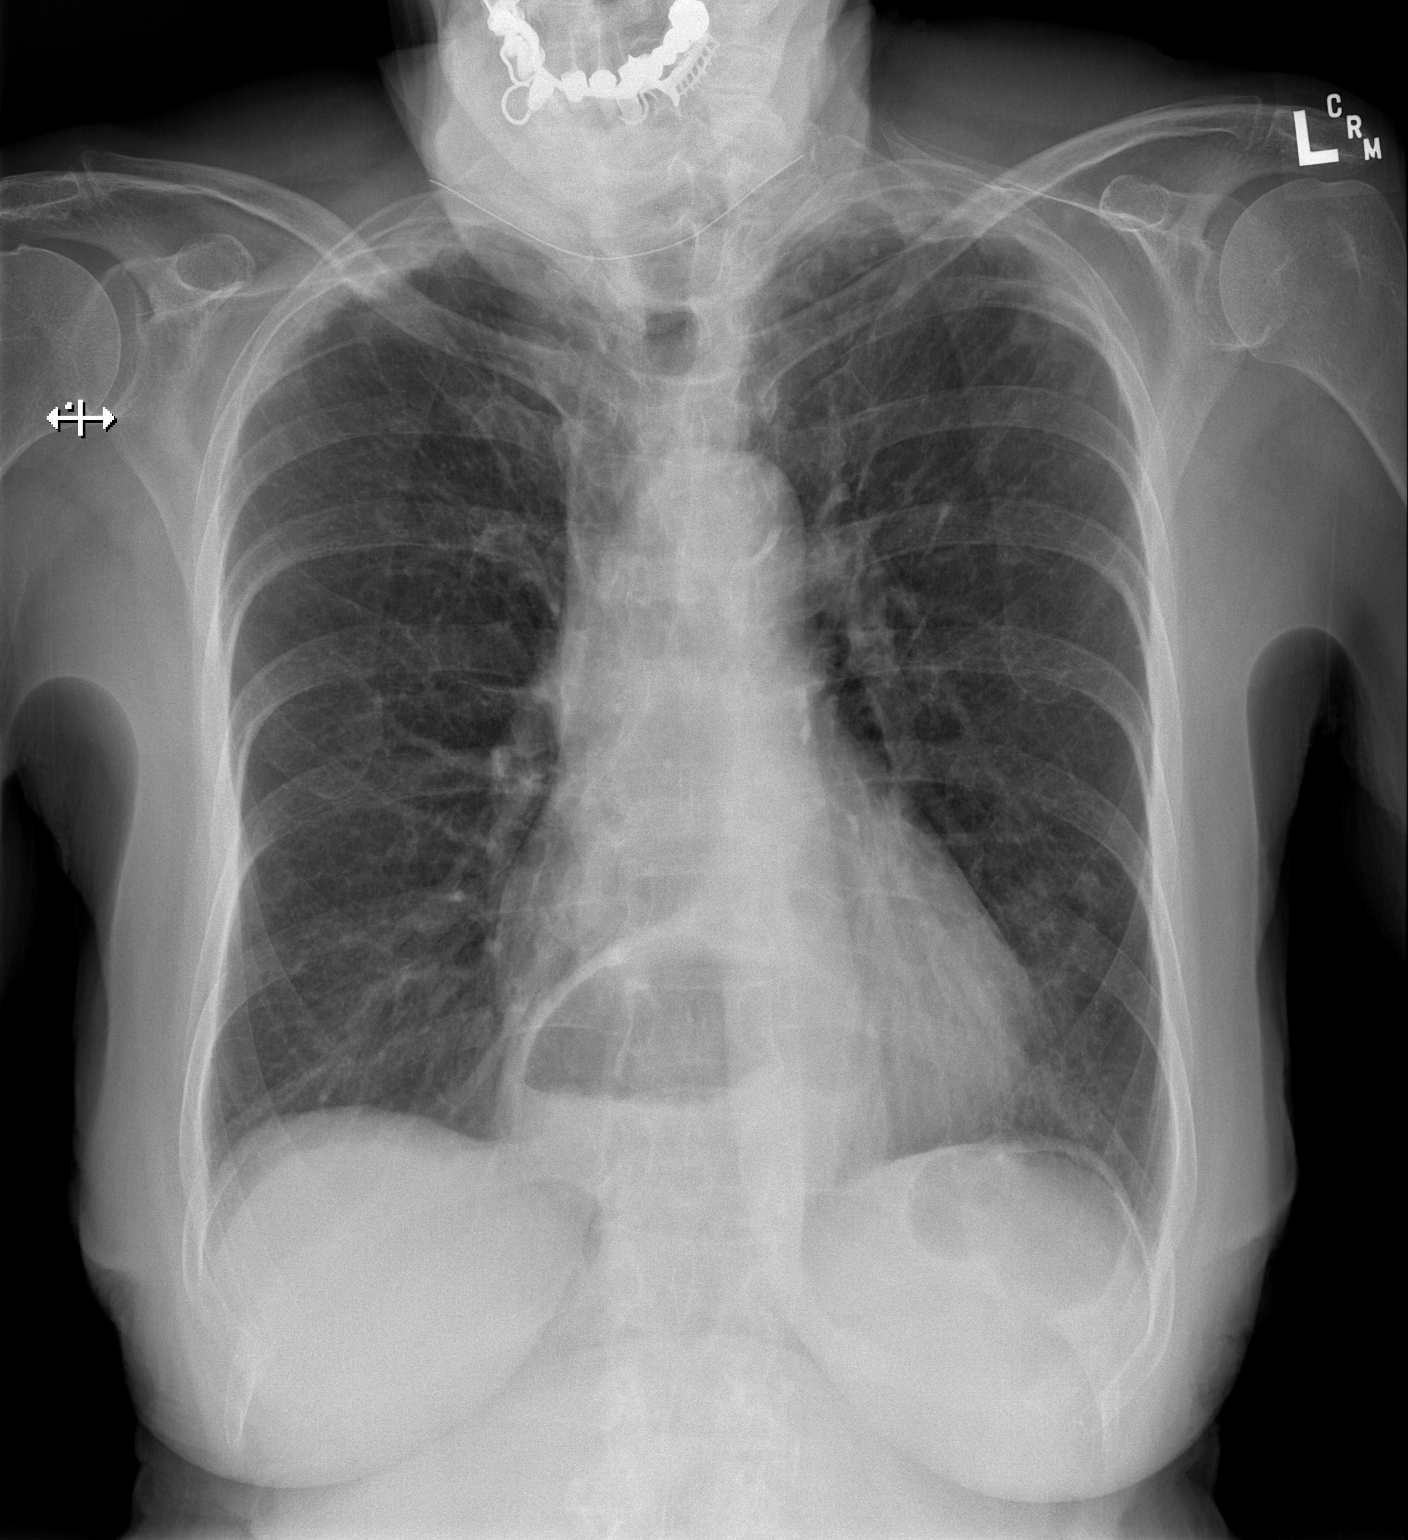

[w chest lat]
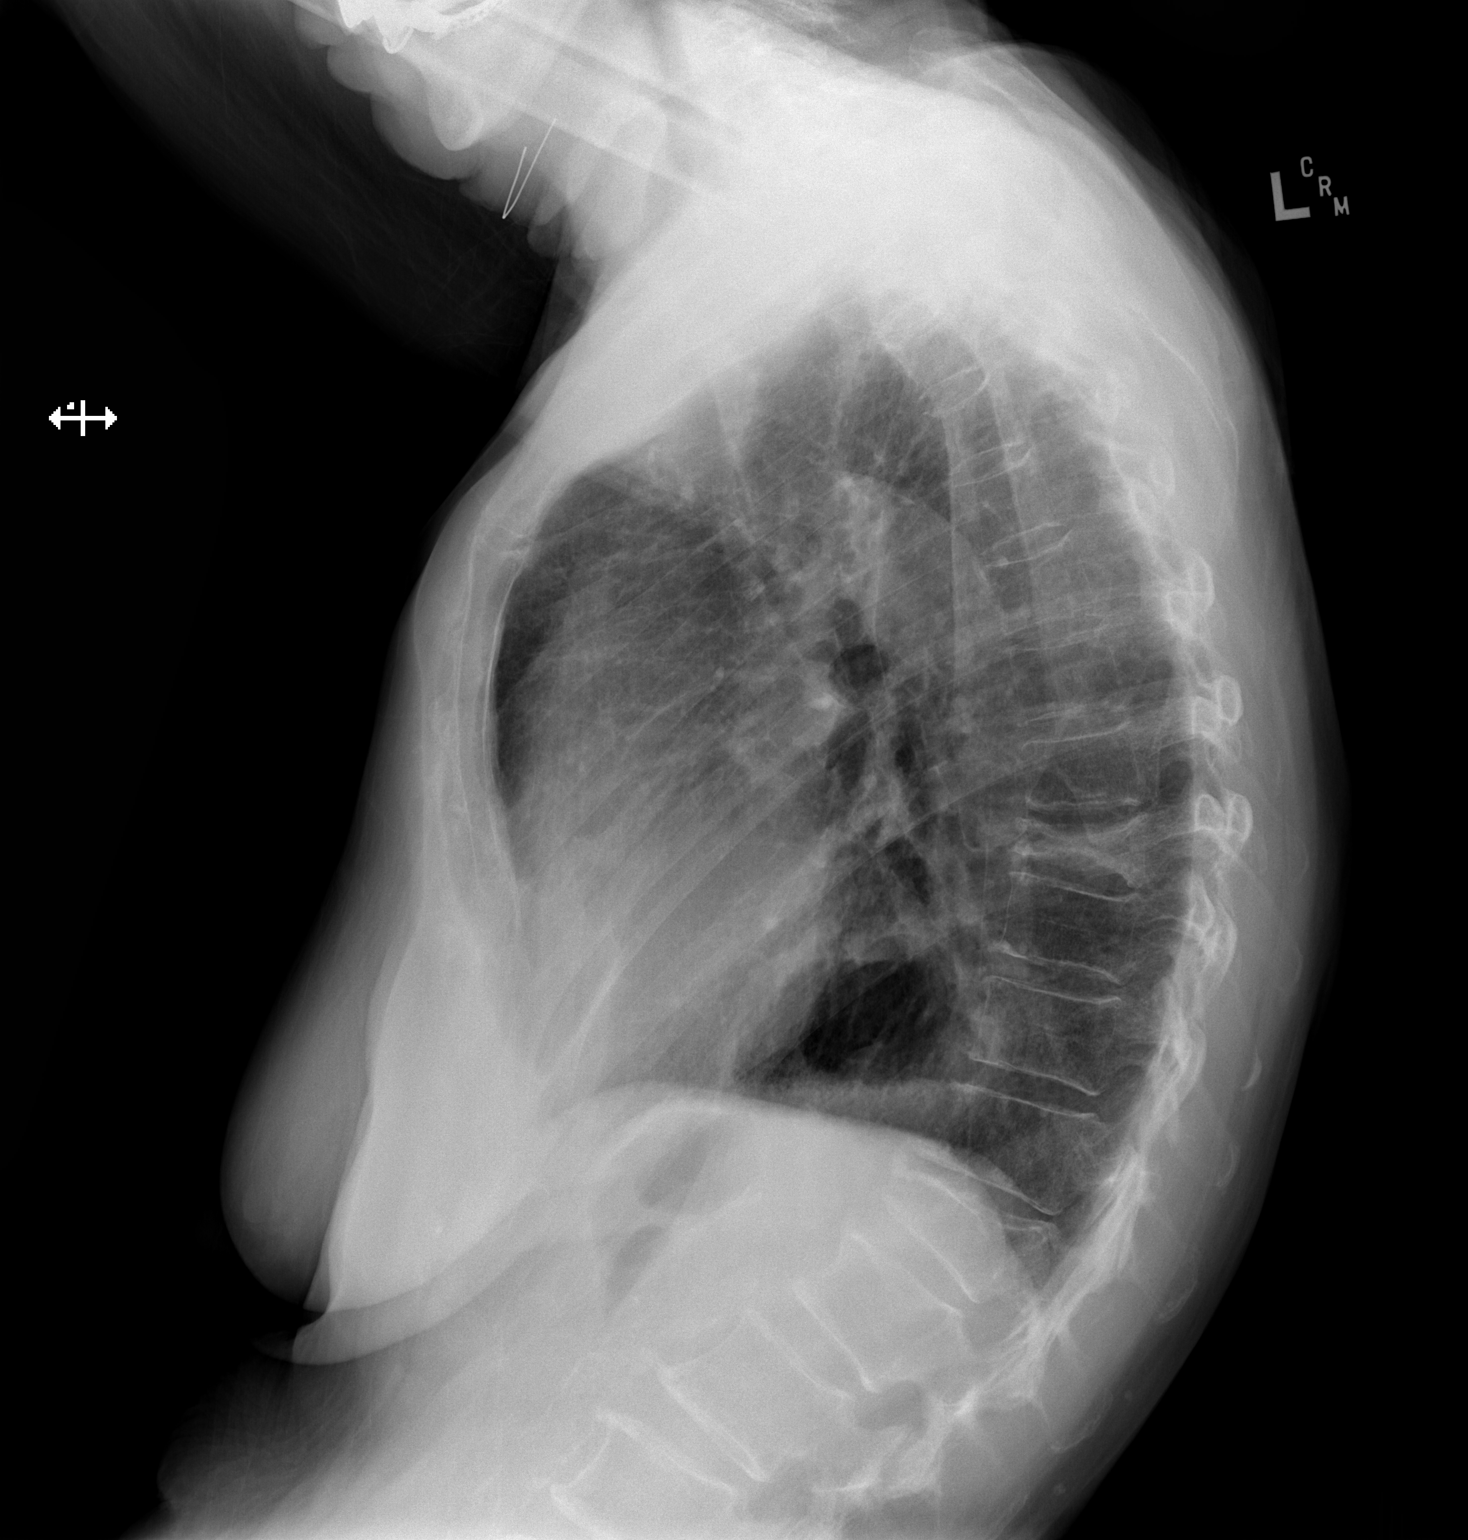

[2 of 2 positions shown; findings below may reference images not displayed]

FINDINGS: Lung volumes are normal. No consolidative airspace disease.
Bilateral apical pleuroparenchymal thickening and nodular
architectural distortion, similar to prior examinations, most
compatible with areas of chronic post infectious or inflammatory
scarring. No pleural effusions. No pneumothorax. No pulmonary nodule
or mass noted. Large hiatal hernia. Pulmonary vasculature and the
cardiomediastinal silhouette are otherwise within normal limits.
Atherosclerosis in the thoracic aorta. New compression fracture of
T8 vertebral body with approximately 70% loss of anterior vertebral
body height in 20% loss of posterior vertebral body height, slightly
progressed compared to prior thoracic spine radiographs 07/20/2020.
Chronic compression fracture of T12 with 50% loss of anterior
vertebral body height, similar to prior studies.
IMPRESSION: 1. No radiographic evidence of acute cardiopulmonary disease.
2. Aortic atherosclerosis.
3. Large hiatal hernia.
4. New compression fracture of T8 vertebral body with approximately
70% loss of anterior vertebral body height in 20% loss of posterior
vertebral body height. Chronic compression fracture of T12 with
approximately 50% loss of anterior vertebral body height, similar to
prior studies.

## 2021-10-15 DIAGNOSIS — H2513 Age-related nuclear cataract, bilateral: Secondary | ICD-10-CM | POA: Diagnosis not present

## 2021-10-15 DIAGNOSIS — H5203 Hypermetropia, bilateral: Secondary | ICD-10-CM | POA: Diagnosis not present

## 2021-10-16 NOTE — Telephone Encounter (Signed)
Patient has had labs done since. ?

## 2022-01-21 ENCOUNTER — Encounter: Payer: Self-pay | Admitting: Nurse Practitioner

## 2022-01-21 ENCOUNTER — Ambulatory Visit (INDEPENDENT_AMBULATORY_CARE_PROVIDER_SITE_OTHER): Payer: Medicare Other | Admitting: Nurse Practitioner

## 2022-01-21 VITALS — BP 114/74 | HR 73 | Ht 64.0 in | Wt 117.4 lb

## 2022-01-21 DIAGNOSIS — R591 Generalized enlarged lymph nodes: Secondary | ICD-10-CM | POA: Diagnosis not present

## 2022-01-21 MED ORDER — DOXYCYCLINE HYCLATE 100 MG PO TABS: 100.0000 mg | ORAL_TABLET | Freq: Two times a day (BID) | ORAL | 0 refills | Status: AC

## 2022-01-21 NOTE — Progress Notes (Addendum)
Subjective:    Patient ID: Jeanne Rogers, female    DOB: [birth year], 81 y.o.   MRN: [id number]  HPI  Patient arrives with a knot that is sore to touch on back of head near neck and ear. Patient has noticed it for 2 days. Patient denies any cold symptoms, cough, sneezing, sore throat, sores to mouth, SOB, wheezing, eye pain, ear pain, headaches, dizziness, changes to vision, fever, chills, stiff neck, body aches.   Review of Systems     Objective:   Physical Exam Vitals reviewed.  Constitutional:      General: She is not in acute distress.    Appearance: Normal appearance. She is normal weight. She is not ill-appearing, toxic-appearing or diaphoretic.  HENT:     Head: Normocephalic and atraumatic.     Right Ear: Tympanic membrane, ear canal and external ear normal. There is no impacted cerumen.     Left Ear: Tympanic membrane, ear canal and external ear normal. There is no impacted cerumen.     Nose: Nose normal. No congestion or rhinorrhea.     Mouth/Throat:     Mouth: Mucous membranes are moist.     Pharynx: No oropharyngeal exudate or posterior oropharyngeal erythema.     Comments: No swelling to gums or irritations noted to tongue, check, or mouth.  Eyes:     General:        Right eye: No discharge.        Left eye: No discharge.     Extraocular Movements: Extraocular movements intact.     Conjunctiva/sclera: Conjunctivae normal.     Pupils: Pupils are equal, round, and reactive to light.  Neck:     Comments: Right sided Suboccipital (appr. 1.5 cm) , postauricular (<1 cm), and cervical (<1 cm) lymphadenopathy. Tender to touch. Cardiovascular:     Rate and Rhythm: Normal rate and regular rhythm.     Pulses: Normal pulses.     Heart sounds: Normal heart sounds. No murmur heard. Pulmonary:     Effort: Pulmonary effort is normal. No respiratory distress.     Breath sounds: Normal breath sounds. No wheezing.  Musculoskeletal:     Cervical back: Normal range of motion  and neck supple. Tenderness present. No rigidity.     Comments: Grossly intact. Ambulates without difficulty  Lymphadenopathy:     Cervical: Cervical adenopathy present.  Skin:    General: Skin is warm.     Capillary Refill: Capillary refill takes less than 2 seconds.     Comments: No reddened or irritated lesions noted to head, scalp, or neck. No areas of discharge or purulent drainage.   Neurological:     Mental Status: She is alert.     Comments: Grossly intact  Psychiatric:        Mood and Affect: Mood normal.        Behavior: Behavior normal.        Assessment & Plan:   1. Lymphadenopathy - No obvious source of infection identified on physical exam.  - Will cover possible bacteria etiologies with doxycycline. - doxycycline (VIBRA-TABS) 100 MG tablet; Take 1 tablet (100 mg total) by mouth 2 (two) times daily for 10 days.  Dispense: 20 tablet; Refill: 0 - Will elevate for leukocytosis with CBC and electrolyte imbalances with CMP - Low suspicion for meningitis due to lack of other symptom. However, will evaluate CBC for leukocytosis.  - CBC with Differential - CMP14+EGFR - RTC in 2 days if not better  with antibiotics.     Note:  This document was prepared using Dragon voice recognition software and may include unintentional dictation errors. Note - This record has been created using [employer].  Chart creation errors have been sought, but may not always  have been located. Such creation errors do not reflect on  the standard of medical care.

## 2022-01-22 LAB — CMP14+EGFR
ALT: 13 IU/L (ref 0–32)
AST: 18 IU/L (ref 0–40)
Albumin/Globulin Ratio: 1.8 (ref 1.2–2.2)
Albumin: 4.7 g/dL (ref 3.7–4.7)
Alkaline Phosphatase: 62 IU/L (ref 44–121)
BUN/Creatinine Ratio: 14 (ref 12–28)
BUN: 17 mg/dL (ref 8–27)
Bilirubin Total: 0.3 mg/dL (ref 0.0–1.2)
CO2: 20 mmol/L (ref 20–29)
Calcium: 10 mg/dL (ref 8.7–10.3)
Chloride: 101 mmol/L (ref 96–106)
Creatinine, Ser: 1.18 mg/dL — ABNORMAL HIGH (ref 0.57–1.00)
Globulin, Total: 2.6 g/dL (ref 1.5–4.5)
Glucose: 118 mg/dL — ABNORMAL HIGH (ref 70–99)
Potassium: 4.7 mmol/L (ref 3.5–5.2)
Sodium: 144 mmol/L (ref 134–144)
Total Protein: 7.3 g/dL (ref 6.0–8.5)
eGFR: 47 mL/min/{1.73_m2} — ABNORMAL LOW (ref >59)

## 2022-01-22 LAB — CBC WITH DIFFERENTIAL/PLATELET
Basophils Absolute: 0.1 10*3/uL (ref 0.0–0.2)
Basos: 1 %
EOS (ABSOLUTE): 0.2 10*3/uL (ref 0.0–0.4)
Eos: 1 %
Hematocrit: 34.8 % (ref 34.0–46.6)
Hemoglobin: 10.3 g/dL — ABNORMAL LOW (ref 11.1–15.9)
Immature Grans (Abs): 0.1 10*3/uL (ref 0.0–0.1)
Immature Granulocytes: 1 %
Lymphocytes Absolute: 1 10*3/uL (ref 0.7–3.1)
Lymphs: 9 %
MCH: 25.4 pg — ABNORMAL LOW (ref 26.6–33.0)
MCHC: 29.6 g/dL — ABNORMAL LOW (ref 31.5–35.7)
MCV: 86 fL (ref 79–97)
Monocytes Absolute: 0.9 10*3/uL (ref 0.1–0.9)
Monocytes: 8 %
Neutrophils Absolute: 9.1 10*3/uL — ABNORMAL HIGH (ref 1.4–7.0)
Neutrophils: 80 %
Platelets: 425 10*3/uL (ref 150–450)
RBC: 4.06 x10E6/uL (ref 3.77–5.28)
RDW: 14.6 % (ref 11.7–15.4)
WBC: 11.4 10*3/uL — ABNORMAL HIGH (ref 3.4–10.8)

## 2022-01-23 ENCOUNTER — Telehealth: Payer: Self-pay

## 2022-01-23 NOTE — Telephone Encounter (Addendum)
Left message to return call .       ----- Message from Claire Shown, NP sent at 01/23/2022  8:16 AM EDT ----- Nurses,  Please contact patient and have her do a quick follow up with Dr. Michael Boston or myself in about 3 to 4 weeks to ensure the bump to her head has gotten better.   Thank you!  Barbee Shropshire

## 2022-01-23 NOTE — Progress Notes (Signed)
Lm

## 2022-01-23 NOTE — Progress Notes (Signed)
Pt informed and appt made for 02/13/22 @ 8:20am

## 2022-02-13 ENCOUNTER — Encounter: Payer: Self-pay | Admitting: Nurse Practitioner

## 2022-02-13 ENCOUNTER — Ambulatory Visit (INDEPENDENT_AMBULATORY_CARE_PROVIDER_SITE_OTHER): Payer: Medicare Other | Admitting: Nurse Practitioner

## 2022-02-13 VITALS — BP 122/64 | HR 80 | Temp 97.9°F | Ht 64.0 in | Wt 120.0 lb

## 2022-02-13 DIAGNOSIS — R591 Generalized enlarged lymph nodes: Secondary | ICD-10-CM | POA: Diagnosis not present

## 2022-02-13 NOTE — Progress Notes (Signed)
   Subjective:    Patient ID: Jeanne Rogers, female    DOB: [birth year], 81 y.o.   MRN: [id number]  HPI  81 year old female patient presents to clinic today for follow-up of swollen lymph node to back of right side of her head.  Patient states that the area has completely gone away.  Patient took doxycycline without difficulty.  Patient has no complaints today  Review of Systems  All other systems reviewed and are negative.      Objective:   Physical Exam Vitals reviewed.  Constitutional:      General: She is not in acute distress.    Appearance: Normal appearance. She is normal weight. She is not ill-appearing, toxic-appearing or diaphoretic.  HENT:     Head: Normocephalic and atraumatic.     Comments: Lymphadenopathy no longer present to base of skull Neurological:     Mental Status: She is alert.  Psychiatric:        Mood and Affect: Mood normal.        Behavior: Behavior normal.           Assessment & Plan:   1. Lymphadenopathy -Lymphadenopathy resolved -No follow-up needed -Return to clinic for routine exams    Note:  This document was prepared using Dragon voice recognition software and may include unintentional dictation errors. Note - This record has been created using [employer].  Chart creation errors have been sought, but may not always  have been located. Such creation errors do not reflect on  the standard of medical care.

## 2022-03-19 ENCOUNTER — Ambulatory Visit (INDEPENDENT_AMBULATORY_CARE_PROVIDER_SITE_OTHER): Payer: Medicare Other | Admitting: Neurology

## 2022-03-19 VITALS — BP 138/59 | HR 91 | Ht 64.0 in | Wt 121.0 lb

## 2022-03-19 DIAGNOSIS — G3184 Mild cognitive impairment, so stated: Secondary | ICD-10-CM

## 2022-03-19 MED ORDER — CITALOPRAM HYDROBROMIDE 10 MG PO TABS: 10.0000 mg | ORAL_TABLET | Freq: Every day | ORAL | 1 refills | Status: DC

## 2022-03-19 MED ORDER — MEMANTINE HCL 10 MG PO TABS: 10.0000 mg | ORAL_TABLET | Freq: Two times a day (BID) | ORAL | 1 refills | Status: DC

## 2022-03-19 NOTE — Progress Notes (Signed)
PATIENT: Jeanne Rogers DOB: Oct 07, 1940  REASON FOR VISIT: follow up for memory  HISTORY FROM: patient, husband  PRIMARY NEUROLOGIST: Dr. Krista Blue   HISTORY  Jeanne Rogers is a 81 year old female, seen in request by her primary care physician Dr. Wolfgang Phoenix for evaluation of memory loss, she is accompanied by her husband at today's clinical visit on October 11, 2019.   I have reviewed and summarized the referring note from the referring physician.  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   Around 2019, she was noted to have gradual onset memory loss, she forget conversation easily, difficulty remember telephone number, and people's name.  This often frustrated her, she felt like she could not do what she used to do.  She continue to drive without difficulty, her husband began to take over paying the household bill recently.   Her father suffered dementia in his late 33s.   Today her Mini-Mental Status Examination is 23 out of 30, she missed 3 out of 3 recalls.   I personally reviewed MRI of the brain without contrast on August 25, 2019, mild generalized atrophy, moderate small vessel disease, no acute abnormality   Laboratory evaluations showed normal CBC, TSH, CMP showed no significant abnormality   Update January 10, 2020 SS: Her daughter, Addie Alonge, faxed a letter prior to appointment, expressing concern about her mother's irritability and mood, she can be very rude to her husband.  Reports not much appetite, lack of energy, spends most of the day in bed, usually scrolling through Facebook, they are asking to address  her mood, hoping to make caregiving somewhat easier her father.   After last visit, was started on Namenda, tolerating well, seems to have stabilized the memory.  Continues to have repetitive questioning, is easily forgetful, has trouble with telephone #'s, dates, times.  Yesterday, thought her son was 52.  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 Still drives in her town, does well with this.  She still cooks, appetite is fair.  Manages her medications, occasionally misses pills.  Takes Ambien to sleep at night.  Is not very active during the day, has trouble telling me what she actually does during the day.  Here today for follow-up accompanied by her husband.  Update July 11, 2020 SS: Here today for follow-up accompanied by her husband, remains on Celexa and Namenda.  Celexa was added at last visit, has been quite helpful for mood, no longer aggressive, or having frequent unkind verbal remarks, is "kinder" per husband.  She manages her medications, with her husband.  Usually does well with taking.  Memory is overall stable, still trouble with the short-term memory, has some word finding difficulty.  She has a calendar, keeps track of her appointments.  She cooks, does the housework (even Medical sales representative).  Has a good appetite.  Sleeping well, but is out of her Ambien prescription.  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  Has a caregiver who comes a few days a week for a few hours, they go  out and run errands.  Weight is stable.  MMSE 26/30.  Update January 16, 2021 SS: Here with husband, [name], MMSE 24/30. Does her own ADLs, forgets when to take medications. Keeps a calendar. Sleeping good, husband thinks sleeps a lot during day. Appetite is good, not much cooking anymore. Finished cardiac rehab. Does drive, not getting lost, no longer has a caregiver. Mood is better with Celexa. Hasn't given up any activities  because of memory. Enjoys being involved in state organizations for children well being. Their daughter is coming from [place] in July, looking forward to that.   Update September 18, 2021 SS: MMSE 22/30 today, here today with her husband, feels memory is stable, short term memory is poor, up and down at night, lays in the bed during the day, drives short distances alone, drove here today with her husband, husband does medications. Plays card game weekly with friends. Husband does most of the housework. Her mood is good on Celexa, interested in Aricept.   Update March 19, 2022 SS: MMSE 22/30 today. Tried Aricept 5 mg for 1 week, claims didn't like it but not sure why, stopped it. Still on Namenda. No major changes in memory. Does only local driving. Mood is good is on Celexa. Sleeping well. No falls. 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 Eating well. Weight is same.   REVIEW OF SYSTEMS: Out of a complete 14 system review of symptoms, the patient complains only of the following symptoms, and all other reviewed systems are negative.  See HPI  ALLERGIES: Allergies  Allergen Reactions   Clopidogrel     Pain in thighs    Methergine [Methylergonovine Maleate] Other (See Comments)    Unknown   Statins Other (See Comments)    Myalgia    HOME MEDICATIONS: Outpatient Medications Prior to Visit  Medication Sig Dispense Refill   acetaminophen (TYLENOL) 500 MG tablet Take 1,000 mg by mouth every 6 (six) hours as needed for moderate pain.     aspirin EC 81 MG tablet Take 81 mg by mouth daily. Swallow whole.     citalopram (CELEXA) 10 MG tablet Take 1 tablet (10 mg total) by mouth daily. 90 tablet 3   ezetimibe (ZETIA) 10 MG tablet Take 1 tablet (10 mg total) by mouth daily. 90 tablet 3   isosorbide mononitrate (IMDUR) 30 MG 24 hr tablet TAKE 1 TABLET(30 MG) BY MOUTH DAILY 90 tablet 1   memantine (NAMENDA) 10 MG tablet Take  1 tablet (10 mg total) by mouth 2 (two) times daily. 60 tablet 11   metoprolol succinate (TOPROL-XL) 25 MG 24 hr tablet TAKE 1/2 TABLET BY MOUTH DAILY 90 tablet 3   alendronate (FOSAMAX) 70 MG tablet Take 1 tablet (70 mg total) by mouth once a week. Take with a full glass of water on an empty stomach. 12 tablet 3   donepezil (ARICEPT) 5 MG tablet Take 1 tablet (5 mg total) by mouth at bedtime. 30 tablet 5   No facility-administered medications prior to visit.    PAST MEDICAL HISTORY: Past Medical History:  Diagnosis Date   Anemia    Collapse, lung    Complication of anesthesia    very sensitive to anesthesia   GERD (gastroesophageal reflux disease)    Hyperlipidemia    Hypertension    Osteoporosis    PONV (postoperative nausea and vomiting)    PUD (peptic ulcer disease)    Spontaneous pneumothorax  PAST SURGICAL HISTORY: Past Surgical History:  Procedure Laterality Date   COLONOSCOPY N/A 01/04/2014   Procedure: COLONOSCOPY;  Surgeon: Rogene Houston, MD;  [facility];  Service: Endoscopy;  Laterality: N/A;  200   ESOPHAGOGASTRODUODENOSCOPY N/A 01/04/2014   Procedure: ESOPHAGOGASTRODUODENOSCOPY (EGD);  Surgeon: Rogene Houston, MD;  [facility];  Service: Endoscopy;  Laterality: N/A;   hysterectomy     KYPHOPLASTY N/A 08/08/2020   Procedure: KYPHOPLASTY THORACIC EIGHT AND THORACIC TWELVE;  Surgeon: Melina Schools, MD;  Location: [place];  Service: Orthopedics;  Laterality: N/A;   LEFT HEART CATH AND CORONARY ANGIOGRAPHY N/A 05/10/2020   Procedure: LEFT HEART CATH AND CORONARY ANGIOGRAPHY;  Surgeon: Belva Crome, MD;  Location: [facility];  Service: Cardiovascular;  Laterality: N/A;   MASS EXCISION Right 02/07/2016   Procedure: EXCISION MASS RIGHT INDEX FINGER AND RIGHT HAND MASS;  Surgeon: Leanora Cover, MD;  Location: [facility];  Service: Orthopedics;  Laterality: Right;   OOPHORECTOMY Bilateral 2005    FAMILY HISTORY: Family History   Problem Relation Age of Onset   Hyperlipidemia Mother    Hypertension Father     SOCIAL HISTORY: Social History   Socioeconomic History   Marital status: Married    Spouse name: Not on file   Number of children: Not on file   Years of education: Not on file   Highest education level: Not on file  Occupational History   Not on file  Tobacco Use   Smoking status: Former    Types: Cigarettes   Smokeless tobacco: Never  Vaping Use   Vaping Use: Never used  Substance and Sexual Activity   Alcohol use: No   Drug use: No   Sexual activity: Not on file  Other Topics Concern   Not on file  Social History Narrative   Not on file   Social Determinants of Health   Financial Resource Strain: Low Risk  (01/01/2021)   Overall Financial Resource Strain (CARDIA)    Difficulty of Paying Living Expenses: Not hard at all  Food Insecurity: No Food Insecurity (01/01/2021)   Hunger Vital Sign    Worried About Running Out of Food in the Last Year: Never true    [place] in the Last Year: Never true  Transportation Needs: No Transportation Needs (01/01/2021)   PRAPARE - Hydrologist (Medical): No    Lack of Transportation (Non-Medical): No  Physical Activity: Inactive (01/01/2021)   Exercise Vital Sign    Days of Exercise per Week: 0 days    Minutes of Exercise per Session: 0 min  Stress: No Stress Concern Present (01/01/2021)   [place]    Feeling of Stress : Not at all  Social Connections: [place] (01/01/2021)   Social Connection and Isolation Panel [NHANES]    Frequency of Communication with Friends and Family: More than three times a week    Frequency of Social Gatherings with Friends and Family: More than three times a week    Attends Religious Services: More than 4 times per year    Active Member of [employer] or Organizations: Yes    Attends Archivist Meetings:  More than 4 times per year    Marital Status: Married  Human resources officer Violence: Not At Risk (01/01/2021)   Humiliation, Afraid, Rape, and Kick questionnaire    Fear of Current or Ex-Partner: No  Emotionally Abused: No    Physically Abused: No    Sexually Abused: No   PHYSICAL EXAM  Vitals:   03/19/22 0825  BP: (!) 138/59  Pulse: 91  Weight: 121 lb (54.9 kg)  Height: '5\' 4"'$  (1.626 m)   Body mass index is 20.77 kg/m.  Generalized: Well developed, in no acute distress     03/19/2022    8:26 AM 09/18/2021    8:59 AM 01/16/2021    8:14 AM  MMSE - Mini Mental State Exam  Orientation to time 0 1 4  Orientation to Place '5 5 5  '$ Registration '3 3 3  '$ Attention/ Calculation '5 4 3  '$ Recall 0 0 0  Language- name 2 objects '2 2 2  '$ Language- repeat '1 1 1  '$ Language- follow 3 step command '3 3 3  '$ Language- read & follow direction '1 1 1  '$ Write a sentence '1 1 1  '$ Copy design '1 1 1  '$ Total score '22 22 24   '$ Neurological examination  Mentation: Alert, cooperative, very pleasant, this history is provided by her husband, she does contribute. Follows all commands speech and language fluent Cranial nerve II-XII: Pupils were equal round reactive to light. Extraocular movements were full, visual field were full on confrontational test. Facial sensation and strength were normal. Head turning and shoulder shrug  were normal and symmetric. Motor: The motor testing reveals 5 over 5 strength of all 4 extremities. Good symmetric motor tone is noted throughout.  Sensory: Sensory testing is intact to soft touch on all 4 extremities. No evidence of extinction is noted.  Coordination: Cerebellar testing reveals good finger-nose-finger and heel-to-shin bilaterally.  Gait and station: Gait is normal.  Independent. Reflexes: Deep tendon reflexes are symmetric and normal bilaterally.   DIAGNOSTIC DATA (LABS, IMAGING, TESTING) - I reviewed patient records, labs, notes, testing and imaging myself where  available.  Lab Results  Component Value Date   WBC 11.4 (H) 01/21/2022   HGB 10.3 (L) 01/21/2022   HCT 34.8 01/21/2022   MCV 86 01/21/2022   PLT 425 01/21/2022      Component Value Date/Time   NA 144 01/21/2022 1043   K 4.7 01/21/2022 1043   CL 101 01/21/2022 1043   CO2 20 01/21/2022 1043   GLUCOSE 118 (H) 01/21/2022 1043   GLUCOSE 86 08/06/2020 0958   BUN 17 01/21/2022 1043   CREATININE 1.18 (H) 01/21/2022 1043   CREATININE 0.92 07/07/2013 0743   CALCIUM 10.0 01/21/2022 1043   PROT 7.3 01/21/2022 1043   ALBUMIN 4.7 01/21/2022 1043   AST 18 01/21/2022 1043   ALT 13 01/21/2022 1043   ALKPHOS 62 01/21/2022 1043   BILITOT 0.3 01/21/2022 1043   GFRNONAA 54 (L) 08/06/2020 0958   GFRAA >60 08/26/2019 0407   Lab Results  Component Value Date   CHOL 242 (H) 09/25/2021   HDL 76 09/25/2021   LDLCALC 143 (H) 09/25/2021   TRIG 131 09/25/2021   CHOLHDL 3.2 09/25/2021   Lab Results  Component Value Date   HGBA1C 5.4 05/10/2020   Lab Results  Component Value Date   VITAMINB12 514 10/11/2019   Lab Results  Component Value Date   TSH 2.521 08/25/2019   ASSESSMENT AND PLAN 81 y.o. year old female  has a past medical history of Anemia, Collapse, lung, Complication of anesthesia, GERD (gastroesophageal reflux disease), Hyperlipidemia, Hypertension, Osteoporosis, PONV (postoperative nausea and vomiting), PUD (peptic ulcer disease), and Spontaneous pneumothorax. here with:  1.  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

## 2022-03-19 NOTE — Patient Instructions (Signed)
Great to see you! Memory is stable Continue Namenda Close monitor of driving Continue to see your primary care doctor See you back as needed

## 2022-03-26 ENCOUNTER — Ambulatory Visit (INDEPENDENT_AMBULATORY_CARE_PROVIDER_SITE_OTHER): Payer: Medicare Other | Admitting: Family Medicine

## 2022-03-26 ENCOUNTER — Encounter: Payer: Self-pay | Admitting: Family Medicine

## 2022-03-26 VITALS — BP 130/73 | HR 85 | Temp 97.9°F | Wt 123.6 lb

## 2022-03-26 DIAGNOSIS — E782 Mixed hyperlipidemia: Secondary | ICD-10-CM

## 2022-03-26 DIAGNOSIS — N1831 Chronic kidney disease, stage 3a: Secondary | ICD-10-CM | POA: Diagnosis not present

## 2022-03-26 DIAGNOSIS — I5181 Takotsubo syndrome: Secondary | ICD-10-CM

## 2022-03-26 DIAGNOSIS — D649 Anemia, unspecified: Secondary | ICD-10-CM

## 2022-03-26 DIAGNOSIS — M818 Other osteoporosis without current pathological fracture: Secondary | ICD-10-CM | POA: Diagnosis not present

## 2022-03-26 DIAGNOSIS — I252 Old myocardial infarction: Secondary | ICD-10-CM | POA: Insufficient documentation

## 2022-03-26 NOTE — Patient Instructions (Signed)
Labs today.  Continue your current medications.  Follow up in 6 months.  Take care  Dr. Lacinda Axon

## 2022-03-27 DIAGNOSIS — D649 Anemia, unspecified: Secondary | ICD-10-CM | POA: Diagnosis not present

## 2022-03-27 DIAGNOSIS — N1831 Chronic kidney disease, stage 3a: Secondary | ICD-10-CM | POA: Diagnosis not present

## 2022-03-27 NOTE — Progress Notes (Signed)
Subjective:  Patient ID: Jeanne Rogers, female    DOB: 03/15/1941  Age: 81 y.o. MRN: [id number]  CC: Chief Complaint  Patient presents with   Follow-up    Pt here for follow up. No concerns/issues at this time    HPI:  81 year old female with a history of NSTEMI and Takotsubo cardiomyopathy, myocardial impairment, osteoporosis, CKD, hyperlipidemia presents for follow-up.  Patient states that she is feeling well.  She has no complaints or concerns at this time.  Patient's blood pressure is well controlled.  Review of most recent labs revealed mild anemia with a hemoglobin of 10.3.  Anemia is normocytic.  Creatinine elevated at 1.18 with GFR of 47.  Suspected CKD is the culprit of her anemia.  Needs labs today.  Patient cannot tolerate statins.  She is on Zetia for hyperlipidemia.  Follows with cardiology given her history of NSTEMI and Takotsubo cardiomyopathy.  Bisphosphonate has been stopped due to length of therapy.  Patient Active Problem List   Diagnosis Date Noted   Stage 3a chronic kidney disease ([place]) 03/27/2022   History of non-ST elevation myocardial infarction (NSTEMI) 03/26/2022   Takotsubo cardiomyopathy    Mild cognitive impairment 10/11/2019   Hyperlipidemia 08/26/2019   Osteoporosis 06/29/2013    Social Hx   Social History   Socioeconomic History   Marital status: Married    Spouse name: Not on file   Number of children: Not on file   Years of education: Not on file   Highest education level: Not on file  Occupational History   Not on file  Tobacco Use   Smoking status: Former    Types: Cigarettes   Smokeless tobacco: Never  Vaping Use   Vaping Use: Never used  Substance and Sexual Activity   Alcohol use: No   Drug use: No   Sexual activity: Not on file  Other Topics Concern   Not on file  Social History Narrative   Not on file   Social Determinants of Health   Financial Resource Strain: Low Risk  (01/01/2021)   Overall Financial  Resource Strain (CARDIA)    Difficulty of Paying Living Expenses: Not hard at all  Food Insecurity: No Food Insecurity (01/01/2021)   Hunger Vital Sign    Worried About Running Out of Food in the Last Year: Never true    Ran Out of Food in the Last Year: Never true  Transportation Needs: No Transportation Needs (01/01/2021)   PRAPARE - Hydrologist (Medical): No    Lack of Transportation (Non-Medical): No  Physical Activity: Inactive (01/01/2021)   Exercise Vital Sign    Days of Exercise per Week: 0 days    Minutes of Exercise per Session: 0 min  Stress: No Stress Concern Present (01/01/2021)   [place]    Feeling of Stress : Not at all  Social Connections: [place] (01/01/2021)   Social Connection and Isolation Panel [NHANES]    Frequency of Communication with Friends and Family: More than three times a week    Frequency of Social Gatherings with Friends and Family: More than three times a week    Attends Religious Services: More than 4 times per year    Active Member of [employer] or Organizations: Yes    Attends Music therapist: More than 4 times per year    Marital Status: Married    Review of Systems Per HPI  Objective:  BP 130/73   Pulse 85   Temp 97.9 F (36.6 C)   Wt 123 lb 9.6 oz (56.1 kg)   SpO2 96%   BMI 21.22 kg/m      03/26/2022    1:10 PM 03/19/2022    8:25 AM 02/13/2022    8:37 AM  BP/Weight  Systolic BP 119 417 408  Diastolic BP 73 59 64  Wt. (Lbs) 123.6 121 120  BMI 21.22 kg/m2 20.77 kg/m2 20.6 kg/m2    Physical Exam Vitals and nursing note reviewed.  Constitutional:      General: She is not in acute distress.    Appearance: Normal appearance.  HENT:     Head: Normocephalic and atraumatic.  Eyes:     General:        Right eye: No discharge.        Left eye: No discharge.     Conjunctiva/sclera: Conjunctivae normal.   Cardiovascular:     Rate and Rhythm: Normal rate and regular rhythm.  Pulmonary:     Effort: Pulmonary effort is normal.     Breath sounds: Normal breath sounds. No wheezing, rhonchi or rales.  Neurological:     Mental Status: She is alert.  Psychiatric:        Mood and Affect: Mood normal.        Behavior: Behavior normal.     Lab Results  Component Value Date   WBC 11.4 (H) 01/21/2022   HGB 10.3 (L) 01/21/2022   HCT 34.8 01/21/2022   PLT 425 01/21/2022   GLUCOSE 118 (H) 01/21/2022   CHOL 242 (H) 09/25/2021   TRIG 131 09/25/2021   HDL 76 09/25/2021   LDLCALC 143 (H) 09/25/2021   ALT 13 01/21/2022   AST 18 01/21/2022   NA 144 01/21/2022   K 4.7 01/21/2022   CL 101 01/21/2022   CREATININE 1.18 (H) 01/21/2022   BUN 17 01/21/2022   CO2 20 01/21/2022   TSH 2.521 08/25/2019   INR 1.0 08/24/2019   HGBA1C 5.4 05/10/2020     Assessment & Plan:   Problem List Items Addressed This Visit       Cardiovascular and Mediastinum   Takotsubo cardiomyopathy    Follows with cardiology.  EF has returned to normal.        Musculoskeletal and Integument   Osteoporosis    Bisphosphonate has been stopped.        Genitourinary   Stage 3a chronic kidney disease ([place]) - Primary    Labs ordered today.      Relevant Orders   CBC   Basic Metabolic Panel   Microalbumin / creatinine urine ratio   Vitamin D, 25-hydroxy     Other   Hyperlipidemia    Not at goal.  Limited due to the fact that she does not tolerate statins.  Continue Zetia.  Needs lipid panel.       Relevant Orders   Lipid Panel   Other Visit Diagnoses     Normocytic anemia       Relevant Orders   Iron, TIBC and Ferritin Panel      Follow-up:  6 months  [place]

## 2022-03-27 NOTE — Assessment & Plan Note (Signed)
Follows with cardiology.  EF has returned to normal.

## 2022-03-27 NOTE — Assessment & Plan Note (Signed)
Not at goal.  Limited due to the fact that she does not tolerate statins.  Continue Zetia.  Needs lipid panel.

## 2022-03-27 NOTE — Assessment & Plan Note (Signed)
Bisphosphonate has been stopped.

## 2022-03-27 NOTE — Assessment & Plan Note (Signed)
Labs ordered today

## 2022-03-28 LAB — BASIC METABOLIC PANEL
BUN/Creatinine Ratio: 18 (ref 12–28)
BUN: 21 mg/dL (ref 8–27)
CO2: 24 mmol/L (ref 20–29)
Calcium: 9.4 mg/dL (ref 8.7–10.3)
Chloride: 101 mmol/L (ref 96–106)
Creatinine, Ser: 1.14 mg/dL — ABNORMAL HIGH (ref 0.57–1.00)
Glucose: 96 mg/dL (ref 70–99)
Potassium: 4.2 mmol/L (ref 3.5–5.2)
Sodium: 141 mmol/L (ref 134–144)
eGFR: 49 mL/min/{1.73_m2} — ABNORMAL LOW (ref >59)

## 2022-03-28 LAB — CBC
Hematocrit: 35.1 % (ref 34.0–46.6)
Hemoglobin: 10.5 g/dL — ABNORMAL LOW (ref 11.1–15.9)
MCH: 24.8 pg — ABNORMAL LOW (ref 26.6–33.0)
MCHC: 29.9 g/dL — ABNORMAL LOW (ref 31.5–35.7)
MCV: 83 fL (ref 79–97)
Platelets: 442 10*3/uL (ref 150–450)
RBC: 4.23 x10E6/uL (ref 3.77–5.28)
RDW: 16.8 % — ABNORMAL HIGH (ref 11.7–15.4)
WBC: 7.7 10*3/uL (ref 3.4–10.8)

## 2022-03-28 LAB — MICROALBUMIN / CREATININE URINE RATIO
Creatinine, Urine: 164 mg/dL
Microalb/Creat Ratio: 97 mg/g creat — ABNORMAL HIGH (ref 0–29)
Microalbumin, Urine: 158.9 ug/mL

## 2022-03-28 LAB — IRON,TIBC AND FERRITIN PANEL
Ferritin: 14 ng/mL — ABNORMAL LOW (ref 15–150)
Iron Saturation: 6 % — CL (ref 15–55)
Iron: 28 ug/dL (ref 27–139)
Total Iron Binding Capacity: 443 ug/dL (ref 250–450)
UIBC: 415 ug/dL — ABNORMAL HIGH (ref 118–369)

## 2022-03-28 LAB — VITAMIN D 25 HYDROXY (VIT D DEFICIENCY, FRACTURES): Vit D, 25-Hydroxy: 34.2 ng/mL (ref 30.0–100.0)

## 2022-03-30 ENCOUNTER — Other Ambulatory Visit: Payer: Self-pay | Admitting: Family Medicine

## 2022-03-30 MED ORDER — IRON (FERROUS SULFATE) 325 (65 FE) MG PO TABS: 325.0000 mg | ORAL_TABLET | Freq: Every day | ORAL | 1 refills | Status: DC

## 2022-04-10 ENCOUNTER — Other Ambulatory Visit: Payer: Self-pay | Admitting: Family Medicine

## 2022-04-10 DIAGNOSIS — D649 Anemia, unspecified: Secondary | ICD-10-CM

## 2022-04-10 LAB — IFOBT (OCCULT BLOOD): IFOBT: NEGATIVE

## 2022-04-22 ENCOUNTER — Telehealth: Payer: Self-pay | Admitting: *Deleted

## 2022-04-22 ENCOUNTER — Encounter: Payer: Self-pay | Admitting: *Deleted

## 2022-04-22 NOTE — Patient Outreach (Signed)
  Care Coordination   Initial Visit Note   04/22/2022 Name: Jeanne Rogers MRN: [id number] DOB: 1941-08-04  Jeanne Rogers is a 81 y.o. year old female who sees Coral Spikes, DO for primary care. I spoke with  Carolynn Sayers by phone today.  What matters to the patients health and wellness today?  "To keep managing well"    Goals Addressed               This Visit's Progress     COMPLETED: "to keep managing well" (pt-stated)        Care Coordination Interventions: Patient interviewed about adult health maintenance status including  importance of yearly Annual Wellness Visit, verified pt had AWV on 09/26/31 Provided education about importance of taking medications as prescribed, eating healthy diet [facility] care coordination program explained to patient who is agreeable to today's outreach but declines further outreach citing no needs.           SDOH assessments and interventions completed:  Yes  SDOH Interventions Today    Flowsheet Row Most Recent Value  SDOH Interventions   Food Insecurity Interventions Intervention Not Indicated  Housing Interventions Intervention Not Indicated  Transportation Interventions Intervention Not Indicated        Care Coordination Interventions Activated:  Yes  Care Coordination Interventions:  Yes, provided   Follow up plan: No further intervention required.   Encounter Outcome:  Pt. Visit Completed   Jacqlyn Larsen [facility], BSN [facility] RN Care Coordinator [phone]

## 2022-07-21 DIAGNOSIS — Z23 Encounter for immunization: Secondary | ICD-10-CM | POA: Diagnosis not present

## 2022-08-07 ENCOUNTER — Encounter: Payer: Self-pay | Admitting: Family Medicine

## 2022-08-07 ENCOUNTER — Ambulatory Visit (INDEPENDENT_AMBULATORY_CARE_PROVIDER_SITE_OTHER): Payer: Medicare Other | Admitting: Family Medicine

## 2022-08-07 VITALS — BP 135/73 | HR 82 | Temp 97.5°F | Wt 122.8 lb

## 2022-08-07 DIAGNOSIS — I5181 Takotsubo syndrome: Secondary | ICD-10-CM | POA: Diagnosis not present

## 2022-08-07 DIAGNOSIS — N1831 Chronic kidney disease, stage 3a: Secondary | ICD-10-CM | POA: Diagnosis not present

## 2022-08-07 DIAGNOSIS — R21 Rash and other nonspecific skin eruption: Secondary | ICD-10-CM | POA: Diagnosis not present

## 2022-08-07 DIAGNOSIS — D509 Iron deficiency anemia, unspecified: Secondary | ICD-10-CM | POA: Diagnosis not present

## 2022-08-07 DIAGNOSIS — L570 Actinic keratosis: Secondary | ICD-10-CM | POA: Diagnosis not present

## 2022-08-07 MED ORDER — TRIAMCINOLONE ACETONIDE 0.5 % EX OINT: 1.0000 | TOPICAL_OINTMENT | Freq: Every day | CUTANEOUS | 0 refills | Status: DC | PRN

## 2022-08-07 NOTE — Assessment & Plan Note (Signed)
Triamcinolone as directed.

## 2022-08-07 NOTE — Patient Instructions (Signed)
Labs ordered.  Referral to dermatology placed.  Follow up in 6 months.  Take care  Dr. Lacinda Axon

## 2022-08-07 NOTE — Assessment & Plan Note (Signed)
History of Takotsubo.  Continuing Imdur and metoprolol.

## 2022-08-07 NOTE — Assessment & Plan Note (Signed)
Labs today to reevaluate renal function.

## 2022-08-07 NOTE — Assessment & Plan Note (Signed)
CBC and iron studies today.

## 2022-08-07 NOTE — Progress Notes (Signed)
Subjective:  Patient ID: Jeanne Rogers, female    DOB: 1941-02-02  Age: 82 y.o. MRN: [id number]  CC: Chief Complaint  Patient presents with   Follow-up    Pt arrives for follow up. No questions/concerns at this time.     HPI:  82 year old female with a history of Takotsubo cardiomyopathy, mild cognitive impairment, CKD stage III, hyperlipidemia, osteoporosis presents for follow-up.  Patient states that she is doing well.  She does however have rash to her arms.  She states that she has areas that she believes are bug bites.  She has been scratching quite a bit.  Cognitive issues are managed by neurology.  She is on Namenda.  Patient has underlying chronic kidney disease.  Needs labs.  Also has anemia likely in the setting of iron deficiency as well as chronic kidney disease.  Has a history of Takotsubo cardiomyopathy.  He is on Imdur and metoprolol.  No chest pain or shortness of breath.  Patient Active Problem List   Diagnosis Date Noted   Iron deficiency anemia 08/07/2022   Actinic keratosis 08/07/2022   Rash 08/07/2022   Stage 3a chronic kidney disease ([place]) 03/27/2022   History of non-ST elevation myocardial infarction (NSTEMI) 03/26/2022   Takotsubo cardiomyopathy    Mild cognitive impairment 10/11/2019   Hyperlipidemia 08/26/2019   Osteoporosis 06/29/2013    Social Hx   Social History   Socioeconomic History   Marital status: Married    Spouse name: Not on file   Number of children: Not on file   Years of education: Not on file   Highest education level: Not on file  Occupational History   Not on file  Tobacco Use   Smoking status: Former    Types: Cigarettes   Smokeless tobacco: Never  Vaping Use   Vaping Use: Never used  Substance and Sexual Activity   Alcohol use: No   Drug use: No   Sexual activity: Not on file  Other Topics Concern   Not on file  Social History Narrative   Not on file   Social Determinants of Health   Financial Resource  Strain: Low Risk  (01/01/2021)   Overall Financial Resource Strain (CARDIA)    Difficulty of Paying Living Expenses: Not hard at all  Food Insecurity: No Food Insecurity (04/22/2022)   Hunger Vital Sign    Worried About Running Out of Food in the Last Year: Never true    Ran Out of Food in the Last Year: Never true  Transportation Needs: No Transportation Needs (04/22/2022)   PRAPARE - Hydrologist (Medical): No    Lack of Transportation (Non-Medical): No  Physical Activity: Inactive (01/01/2021)   Exercise Vital Sign    Days of Exercise per Week: 0 days    Minutes of Exercise per Session: 0 min  Stress: No Stress Concern Present (01/01/2021)   [place]    Feeling of Stress : Not at all  Social Connections: [place] (01/01/2021)   Social Connection and Isolation Panel [NHANES]    Frequency of Communication with Friends and Family: More than three times a week    Frequency of Social Gatherings with Friends and Family: More than three times a week    Attends Religious Services: More than 4 times per year    Active Member of [employer] or Organizations: Yes    Attends Archivist Meetings: More than 4 times per  year    Marital Status: Married    Review of Systems Per HPI  Objective:  BP 135/73   Pulse 82   Temp (!) 97.5 F (36.4 C)   Wt 122 lb 12.8 oz (55.7 kg)   SpO2 97%   BMI 21.08 kg/m      08/07/2022    9:16 AM 03/26/2022    1:10 PM 03/19/2022    8:25 AM  BP/Weight  Systolic BP 637 858 850  Diastolic BP 73 73 59  Wt. (Lbs) 122.8 123.6 121  BMI 21.08 kg/m2 21.22 kg/m2 20.77 kg/m2    Physical Exam Vitals and nursing note reviewed.  Constitutional:      General: She is not in acute distress.    Appearance: Normal appearance.  HENT:     Head: Normocephalic and atraumatic.  Cardiovascular:     Rate and Rhythm: Normal rate and regular rhythm.  Pulmonary:      Effort: Pulmonary effort is normal.     Breath sounds: Normal breath sounds. No wheezing, rhonchi or rales.  Skin:    Comments: Patient's forearms with areas of actinic keratoses.  There are several raised areas with eschar secondary to excoriation.  Neurological:     Mental Status: She is alert.  Psychiatric:        Mood and Affect: Mood normal.        Behavior: Behavior normal.     Lab Results  Component Value Date   WBC 7.7 03/27/2022   HGB 10.5 (L) 03/27/2022   HCT 35.1 03/27/2022   PLT 442 03/27/2022   GLUCOSE 96 03/27/2022   CHOL 242 (H) 09/25/2021   TRIG 131 09/25/2021   HDL 76 09/25/2021   LDLCALC 143 (H) 09/25/2021   ALT 13 01/21/2022   AST 18 01/21/2022   NA 141 03/27/2022   K 4.2 03/27/2022   CL 101 03/27/2022   CREATININE 1.14 (H) 03/27/2022   BUN 21 03/27/2022   CO2 24 03/27/2022   TSH 2.521 08/25/2019   INR 1.0 08/24/2019   HGBA1C 5.4 05/10/2020     Assessment & Plan:   Problem List Items Addressed This Visit       Cardiovascular and Mediastinum   Takotsubo cardiomyopathy    History of Takotsubo.  Continuing Imdur and metoprolol.        Musculoskeletal and Integument   Rash    Triamcinolone as directed.      Actinic keratosis    Referring to dermatology.      Relevant Orders   Ambulatory referral to Dermatology     Genitourinary   Stage 3a chronic kidney disease ([place]) - Primary    Labs today to reevaluate renal function.      Relevant Orders   Basic metabolic panel   Microalbumin / creatinine urine ratio   CBC     Other   Iron deficiency anemia    CBC and iron studies today.      Relevant Orders   Iron, TIBC and Ferritin Panel    Meds ordered this encounter  Medications   triamcinolone ointment (KENALOG) 0.5 %    Sig: Apply 1 Application topically daily as needed.    Dispense:  30 g    Refill:  0    Follow-up:  Return in about 6 months (around 02/05/2023).  [place]

## 2022-08-07 NOTE — Assessment & Plan Note (Signed)
Referring to dermatology. 

## 2022-08-08 LAB — CBC

## 2022-08-09 LAB — BASIC METABOLIC PANEL
BUN/Creatinine Ratio: 19 (ref 12–28)
BUN: 18 mg/dL (ref 8–27)
CO2: 24 mmol/L (ref 20–29)
Calcium: 9.8 mg/dL (ref 8.7–10.3)
Chloride: 102 mmol/L (ref 96–106)
Creatinine, Ser: 0.97 mg/dL (ref 0.57–1.00)
Glucose: 86 mg/dL (ref 70–99)
Potassium: 4 mmol/L (ref 3.5–5.2)
Sodium: 143 mmol/L (ref 134–144)
eGFR: 59 mL/min/{1.73_m2} — ABNORMAL LOW (ref >59)

## 2022-08-09 LAB — IRON,TIBC AND FERRITIN PANEL
Ferritin: 46 ng/mL (ref 15–150)
Iron Saturation: 13 % — ABNORMAL LOW (ref 15–55)
Iron: 49 ug/dL (ref 27–139)
Total Iron Binding Capacity: 392 ug/dL (ref 250–450)
UIBC: 343 ug/dL (ref 118–369)

## 2022-08-09 LAB — CBC
Hematocrit: 40.5 % (ref 34.0–46.6)
Hemoglobin: 12.2 g/dL (ref 11.1–15.9)
MCH: 28.4 pg (ref 26.6–33.0)
MCHC: 30.1 g/dL — ABNORMAL LOW (ref 31.5–35.7)
MCV: 94 fL (ref 79–97)
Platelets: 456 10*3/uL — ABNORMAL HIGH (ref 150–450)
RBC: 4.3 x10E6/uL (ref 3.77–5.28)
RDW: 14.1 % (ref 11.7–15.4)
WBC: 6.9 10*3/uL (ref 3.4–10.8)

## 2022-08-09 LAB — MICROALBUMIN / CREATININE URINE RATIO
Creatinine, Urine: 220.2 mg/dL
Microalb/Creat Ratio: 68 mg/g creat — ABNORMAL HIGH (ref 0–29)
Microalbumin, Urine: 150 ug/mL

## 2022-09-11 ENCOUNTER — Ambulatory Visit: Payer: Medicare Other | Admitting: Cardiology

## 2022-09-11 NOTE — Progress Notes (Deleted)
Cardiology Clinic Note   Patient Name: Jeanne Rogers Date of Encounter: 09/11/2022  Primary Care Provider:  Coral Spikes, DO Primary Cardiologist:  None  Patient Profile    82 year old female with past medical history of hypertension, hyperlipidemia, spontaneous pneumothorax, mild dementia, and Takotsubo cardiomyopathy in 10 of 2021 when she was status post NSTEMI with a left heart catheterization revealing normal coronary arteries, who is here today to follow-up on her cardiomyopathy and hypertension.  Past Medical History    Past Medical History:  Diagnosis Date   Anemia    Collapse, lung    Complication of anesthesia    very sensitive to anesthesia   GERD (gastroesophageal reflux disease)    Hyperlipidemia    Hypertension    Osteoporosis    PONV (postoperative nausea and vomiting)    PUD (peptic ulcer disease)    Spontaneous pneumothorax    Past Surgical History:  Procedure Laterality Date   COLONOSCOPY N/A 01/04/2014   Procedure: COLONOSCOPY;  Surgeon: Rogene Houston, MD;  [facility];  Service: Endoscopy;  Laterality: N/A;  200   ESOPHAGOGASTRODUODENOSCOPY N/A 01/04/2014   Procedure: ESOPHAGOGASTRODUODENOSCOPY (EGD);  Surgeon: Rogene Houston, MD;  Location: [facility];  Service: Endoscopy;  Laterality: N/A;   hysterectomy     KYPHOPLASTY N/A 08/08/2020   Procedure: KYPHOPLASTY THORACIC EIGHT AND THORACIC TWELVE;  Surgeon: Melina Schools, MD;  Location: [facility];  Service: Orthopedics;  Laterality: N/A;   LEFT HEART CATH AND CORONARY ANGIOGRAPHY N/A 05/10/2020   Procedure: LEFT HEART CATH AND CORONARY ANGIOGRAPHY;  Surgeon: Belva Crome, MD;  Location: [facility];  Service: Cardiovascular;  Laterality: N/A;   MASS EXCISION Right 02/07/2016   Procedure: EXCISION MASS RIGHT INDEX FINGER AND RIGHT HAND MASS;  Surgeon: Leanora Cover, MD;  Location: [facility];  Service: Orthopedics;  Laterality: Right;   OOPHORECTOMY Bilateral 2005     Allergies  Allergies  Allergen Reactions   Clopidogrel     Pain in thighs    Methergine [Methylergonovine Maleate] Other (See Comments)    Unknown   Statins Other (See Comments)    Myalgia    History of Present Illness    [place]ster[place]. Adamek is an 82 year old female status post NSTEMI 05/2020 with left heart catheterization revealed normal coronary arteries, Takotsubo cardiomyopathy on echocardiogram 10/21 with an EF of 35-35%, low blood pressure limited GDMT, recurrent chest pain post DC which negated and emergency department visits for nitrates were started, repeat echocardiogram in 08/2020 revealed EF of 65-70%, hypertension, hyperlipidemia, previous history of spontaneous pneumothorax, and mild dementia.  Cardiac catheterization in 05/10/2020 revealed right dominant coronary anatomy, widely patent left main, widely patent LAD, widely patent circumflex, widely patent RCA, coronary tuberosity, atypical ballooning are of Takotsubo cardiomyopathy with LV EE Deep EF 6 mmHg with an EF of 30-35%, echocardiogram in 05/10/2020 revealed EF of 30-35%, apical lateral, anterior, inferior akinesis, G1 DD, normal RV SF, RVSP 36.9, with small effusion noted, repeat echocardiogram done 08/17/2020 revealed LVEF of 65 to 70%, no regional wall motion abnormalities, mild LVH, G1 DD, and normal RVSF.  She was last seen in clinic 03/11/2021 by Dr. Johnsie Cancel without any complaints of chest pain, shortness of breath or decompensation.  She was continued on her current medication without further testing ordered and was to continue up as needed.  She returns to clinic today  Home Medications    Current Outpatient Medications  Medication Sig Dispense  Refill   acetaminophen (TYLENOL) 500 MG tablet Take 1,000 mg by mouth every 6 (six) hours as needed for moderate pain.     aspirin EC 81 MG tablet Take 81 mg by mouth daily. Swallow whole.     citalopram (CELEXA) 10 MG tablet Take 1 tablet (10 mg total) by mouth daily.  90 tablet 1   ezetimibe (ZETIA) 10 MG tablet Take 1 tablet (10 mg total) by mouth daily. 90 tablet 3   Iron, Ferrous Sulfate, 325 (65 Fe) MG TABS Take 325 mg by mouth daily. 90 tablet 1   isosorbide mononitrate (IMDUR) 30 MG 24 hr tablet TAKE 1 TABLET(30 MG) BY MOUTH DAILY 90 tablet 1   memantine (NAMENDA) 10 MG tablet Take 1 tablet (10 mg total) by mouth 2 (two) times daily. 180 tablet 1   metoprolol succinate (TOPROL-XL) 25 MG 24 hr tablet TAKE 1/2 TABLET BY MOUTH DAILY 90 tablet 3   triamcinolone ointment (KENALOG) 0.5 % Apply 1 Application topically daily as needed. 30 g 0   No current facility-administered medications for this visit.     Family History    Family History  Problem Relation Age of Onset   Hyperlipidemia Mother    Hypertension Father    She indicated that her mother is deceased. She indicated that her father is deceased. She indicated that her daughter is alive. She indicated that her son is alive.  Social History    Social History   Socioeconomic History   Marital status: Married    Spouse name: Not on file   Number of children: Not on file   Years of education: Not on file   Highest education level: Not on file  Occupational History   Not on file  Tobacco Use   Smoking status: Former    Types: Cigarettes   Smokeless tobacco: Never  Vaping Use   Vaping Use: Never used  Substance and Sexual Activity   Alcohol use: No   Drug use: No   Sexual activity: Not on file  Other Topics Concern   Not on file  Social History Narrative   Not on file   Social Determinants of Health   Financial Resource Strain: Low Risk  (01/01/2021)   Overall Financial Resource Strain (CARDIA)    Difficulty of Paying Living Expenses: Not hard at all  Food Insecurity: No Food Insecurity (04/22/2022)   Hunger Vital Sign    Worried About Running Out of Food in the Last Year: Never true    [place] in the Last Year: Never true  Transportation Needs: No Transportation Needs  (04/22/2022)   PRAPARE - Hydrologist (Medical): No    Lack of Transportation (Non-Medical): No  Physical Activity: Inactive (01/01/2021)   Exercise Vital Sign    Days of Exercise per Week: 0 days    Minutes of Exercise per Session: 0 min  Stress: No Stress Concern Present (01/01/2021)   [place]    Feeling of Stress : Not at all  Social Connections: [place] (01/01/2021)   Social Connection and Isolation Panel [NHANES]    Frequency of Communication with Friends and Family: More than three times a week    Frequency of Social Gatherings with Friends and Family: More than three times a week    Attends Religious Services: More than 4 times per year    Active Member of [employer] or Organizations: Yes  Attends Archivist Meetings: More than 4 times per year    Marital Status: Married  Human resources officer Violence: Not At Risk (01/01/2021)   Humiliation, Afraid, Rape, and Kick questionnaire    Fear of Current or Ex-Partner: No    Emotionally Abused: No    Physically Abused: No    Sexually Abused: No     Review of Systems    General:  No chills, fever, night sweats or weight changes.  Cardiovascular:  No chest pain, dyspnea on exertion, edema, orthopnea, palpitations, paroxysmal nocturnal dyspnea. Dermatological: No rash, lesions/masses Respiratory: No cough, dyspnea Urologic: No hematuria, dysuria Abdominal:   No nausea, vomiting, diarrhea, bright red blood per rectum, melena, or hematemesis Neurologic:  No visual changes, wkns, changes in mental status. All other systems reviewed and are otherwise negative except as noted above.     Physical Exam    VS:  There were no vitals taken for this visit. , BMI There is no height or weight on file to calculate BMI.     GEN: Well nourished, well developed, in no acute distress. HEENT: normal. Neck: Supple, no JVD, carotid bruits,  or masses. Cardiac: RRR, no murmurs, rubs, or gallops. No clubbing, cyanosis, edema.  Radials 2+/PT 2+ and equal bilaterally.  Respiratory:  Respirations regular and unlabored, clear to auscultation bilaterally. GI: Soft, nontender, nondistended, BS + x 4. MS: no deformity or atrophy. Skin: warm and dry, no rash. Neuro:  Strength and sensation are intact. Psych: Normal affect.  Accessory Clinical Findings    ECG personally reviewed by me today- *** - No acute changes  Lab Results  Component Value Date   WBC 6.9 08/07/2022   HGB 12.2 08/07/2022   HCT 40.5 08/07/2022   MCV 94 08/07/2022   PLT 456 (H) 08/07/2022   Lab Results  Component Value Date   CREATININE 0.97 08/07/2022   BUN 18 08/07/2022   NA 143 08/07/2022   K 4.0 08/07/2022   CL 102 08/07/2022   CO2 24 08/07/2022   Lab Results  Component Value Date   ALT 13 01/21/2022   AST 18 01/21/2022   ALKPHOS 62 01/21/2022   BILITOT 0.3 01/21/2022   Lab Results  Component Value Date   CHOL 242 (H) 09/25/2021   HDL 76 09/25/2021   LDLCALC 143 (H) 09/25/2021   TRIG 131 09/25/2021   CHOLHDL 3.2 09/25/2021    Lab Results  Component Value Date   HGBA1C 5.4 05/10/2020    Assessment & Plan   1.  ***  Tytus Strahle, NP 09/11/2022, 12:09 PM

## 2022-09-12 ENCOUNTER — Encounter: Payer: Self-pay | Admitting: Cardiology

## 2022-09-26 ENCOUNTER — Ambulatory Visit (INDEPENDENT_AMBULATORY_CARE_PROVIDER_SITE_OTHER): Payer: Medicare Other

## 2022-09-26 VITALS — BP 128/72 | Ht 64.0 in | Wt 126.0 lb

## 2022-09-26 DIAGNOSIS — Z0000 Encounter for general adult medical examination without abnormal findings: Secondary | ICD-10-CM

## 2022-09-26 NOTE — Progress Notes (Signed)
Subjective:   Jeanne Rogers is a 82 y.o. female who presents for Medicare Annual (Subsequent) preventive examination.  Review of Systems     Cardiac Risk Factors include: advanced age (>72mn, >>57women);hypertension;dyslipidemia     Objective:    Today's Vitals   09/26/22 1431  BP: 128/72  Weight: 126 lb (57.2 kg)  Height: '5\' 4"'$  (1.626 m)   Body mass index is 21.63 kg/m.     09/26/2022    3:24 PM 01/01/2021    9:23 AM 08/06/2020    9:43 AM 07/05/2020    9:33 AM 05/11/2020   10:11 AM 05/10/2020    4:00 AM 05/09/2020    8:19 PM  Advanced Directives  Does Patient Have a Medical Advance Directive? Yes No No No No No Yes  Type of Advance Directive Living will;[place]care Power of [place]  Does patient want to make changes to medical advance directive? No - Patient declined        [place]? No - copy requested        Would patient like information on creating a medical advance directive?  No - Patient declined  No - Patient declined No - Patient declined Yes (Inpatient - patient requests chaplain consult to create a medical advance directive)     Current Medications (verified) Outpatient Encounter Medications as of 09/26/2022  Medication Sig   acetaminophen (TYLENOL) 500 MG tablet Take 1,000 mg by mouth every 6 (six) hours as needed for moderate pain.   alendronate (FOSAMAX) 70 MG tablet Take 70 mg by mouth once a week.   aspirin EC 81 MG tablet Take 81 mg by mouth daily. Swallow whole.   citalopram (CELEXA) 10 MG tablet Take 1 tablet (10 mg total) by mouth daily.   ezetimibe (ZETIA) 10 MG tablet Take 1 tablet (10 mg total) by mouth daily.   Iron, Ferrous Sulfate, 325 (65 Fe) MG TABS Take 325 mg by mouth daily.   isosorbide mononitrate (IMDUR) 30 MG 24 hr tablet TAKE 1 TABLET(30 MG) BY MOUTH DAILY   memantine (NAMENDA) 10 MG tablet Take 1 tablet (10 mg total) by mouth 2 (two) times daily.    metoprolol succinate (TOPROL-XL) 25 MG 24 hr tablet TAKE 1/2 TABLET BY MOUTH DAILY   triamcinolone ointment (KENALOG) 0.5 % Apply 1 Application topically daily as needed.   No facility-administered encounter medications on file as of 09/26/2022.    Allergies (verified) Clopidogrel, Methergine [methylergonovine maleate], and Statins   History: Past Medical History:  Diagnosis Date   Anemia    Collapse, lung    Complication of anesthesia    very sensitive to anesthesia   GERD (gastroesophageal reflux disease)    Hyperlipidemia    Hypertension    Osteoporosis    PONV (postoperative nausea and vomiting)    PUD (peptic ulcer disease)    Spontaneous pneumothorax    Past Surgical History:  Procedure Laterality Date   ABDOMINAL HYSTERECTOMY     COLONOSCOPY N/A 01/04/2014   Procedure: COLONOSCOPY;  Surgeon: NRogene Houston MD;  [facility];  Service: Endoscopy;  Laterality: N/A;  200   ESOPHAGOGASTRODUODENOSCOPY N/A 01/04/2014   Procedure: ESOPHAGOGASTRODUODENOSCOPY (EGD);  Surgeon: NRogene Houston MD;  [facility];  Service: Endoscopy;  Laterality: N/A;   hysterectomy     KYPHOPLASTY N/A 08/08/2020   Procedure: KYPHOPLASTY THORACIC EIGHT AND THORACIC TWELVE;  Surgeon:  Melina Schools, MD;  Location: [facility];  Service: Orthopedics;  Laterality: N/A;   LEFT HEART CATH AND CORONARY ANGIOGRAPHY N/A 05/10/2020   Procedure: LEFT HEART CATH AND CORONARY ANGIOGRAPHY;  Surgeon: Belva Crome, MD;  Location: [facility];  Service: Cardiovascular;  Laterality: N/A;   MASS EXCISION Right 02/07/2016   Procedure: EXCISION MASS RIGHT INDEX FINGER AND RIGHT HAND MASS;  Surgeon: Leanora Cover, MD;  Location: [facility];  Service: Orthopedics;  Laterality: Right;   OOPHORECTOMY Bilateral 08/05/2003   SPINE SURGERY     Family History  Problem Relation Age of Onset   Hyperlipidemia Mother    Hypertension Father    Social History   Socioeconomic History    Marital status: Married    Spouse name: Not on file   Number of children: Not on file   Years of education: Not on file   Highest education level: Not on file  Occupational History   Not on file  Tobacco Use   Smoking status: Former    Types: Cigarettes    Quit date: 10/02/1988    Years since quitting: 34.0   Smokeless tobacco: Never  Vaping Use   Vaping Use: Never used  Substance and Sexual Activity   Alcohol use: No   Drug use: No   Sexual activity: Not Currently  Other Topics Concern   Not on file  Social History Narrative   Not on file   Social Determinants of Health   Financial Resource Strain: Low Risk  (09/26/2022)   Overall Financial Resource Strain (CARDIA)    Difficulty of Paying Living Expenses: Not hard at all  Food Insecurity: No Food Insecurity (09/26/2022)   Hunger Vital Sign    Worried About Running Out of Food in the Last Year: Never true    [place] in the Last Year: Never true  Transportation Needs: No Transportation Needs (09/26/2022)   PRAPARE - Hydrologist (Medical): No    Lack of Transportation (Non-Medical): No  Physical Activity: Insufficiently Active (09/26/2022)   Exercise Vital Sign    Days of Exercise per Week: 2 days    Minutes of Exercise per Session: 30 min  Stress: No Stress Concern Present (09/26/2022)   [place]    Feeling of Stress : Only a little  Social Connections: Socially Integrated (09/26/2022)   Social Connection and Isolation Panel [NHANES]    Frequency of Communication with Friends and Family: More than three times a week    Frequency of Social Gatherings with Friends and Family: Twice a week    Attends Religious Services: More than 4 times per year    Active Member of [employer] or Organizations: Yes    Attends Music therapist: More than 4 times per year    Marital Status: Married    Tobacco  Counseling Counseling given: Not Answered   Clinical Intake:  Pre-visit preparation completed: Yes  Pain : No/denies pain     Diabetes: No  How often do you need to have someone help you when you read instructions, pamphlets, or other written materials from your doctor or pharmacy?: 1 - Never  Diabetic?No   Interpreter Needed?: No  Information entered by :: Denman George LPN   Activities of Daily Living    09/26/2022    3:24 PM 09/25/2022   11:10 AM  In your present state of health, do you have any  difficulty performing the following activities:  Hearing? 0 0  Vision? 0 0  Difficulty concentrating or making decisions? 1 0  Walking or climbing stairs? 0 0  Dressing or bathing? 0 0  Doing errands, shopping? 0 0  Preparing Food and eating ? N N  Using the Toilet? N N  In the past six months, have you accidently leaked urine? N N  Do you have problems with loss of bowel control? N N  Managing your Medications? N N  Managing your Finances? N N  Housekeeping or managing your Housekeeping? N N    Patient Care Team: Coral Spikes, DO as PCP - General (Family Medicine) Suzzanne Cloud, NP as Nurse Practitioner (Neurology) Josue Hector, MD as Consulting Physician (Cardiology) Melissa Noon, [place] as Referring Physician (Optometry)  Indicate any recent Medical Services you may have received from other than [facility] providers in the past year (date may be approximate).     Assessment:   This is a routine wellness examination for Jeanne Rogers.  Hearing/Vision screen Hearing Screening - Comments:: Denies hearing difficulties  Vision Screening - Comments:: Wears rx glasses - up to date with routine eye exams with Dr. Delman Cheadle   Dietary issues and exercise activities discussed: Current Exercise Habits: Home exercise routine, Type of exercise: walking, Time (Minutes): 30, Frequency (Times/Week): 2, Weekly Exercise (Minutes/Week): 60, Intensity: Mild   Goals Addressed   None     Depression Screen    09/26/2022    3:22 PM 08/07/2022    9:20 AM 09/25/2021    1:33 PM 01/01/2021    9:25 AM 10/31/2020    7:56 AM 10/18/2020    9:01 AM 07/05/2020   10:03 AM  PHQ 2/9 Scores  PHQ - 2 Score 0 0 0 0 0 0 0  PHQ- 9 Score     1  2    Fall Risk    09/26/2022    3:27 PM 09/25/2022   11:10 AM 08/07/2022    9:19 AM 04/22/2022    1:13 PM 09/25/2021    1:33 PM  [place] in the past year? 0 0 0 0 0  Number falls in past yr: 0 0 0  0  Injury with Fall? 0 0 0  0  Risk for fall due to :   Impaired balance/gait  No Fall Risks  Follow up Falls prevention discussed;Education provided;Falls evaluation completed  Falls evaluation completed;Education provided  Falls evaluation completed    FALL RISK PREVENTION PERTAINING TO THE HOME:  Any stairs in or around the home? Yes  If so, are there any without handrails? No  Home free of loose throw rugs in walkways, pet beds, electrical cords, etc? Yes  Adequate lighting in your home to reduce risk of falls? Yes   ASSISTIVE DEVICES UTILIZED TO PREVENT FALLS:  Life alert? No  Use of a cane, walker or w/c? No  Grab bars in the bathroom? Yes  Shower chair or bench in shower? No  Elevated toilet seat or a handicapped toilet? Yes   TIMED UP AND GO:  Was the test performed? Yes .  Length of time to ambulate 10 feet: 5 sec.   Gait steady and fast without use of assistive device  Cognitive Function:    03/19/2022    8:26 AM 09/18/2021    8:59 AM 01/16/2021    8:14 AM 07/11/2020    8:17 AM 01/10/2020    8:01 AM  MMSE - Mini Mental  State Exam  Orientation to time 0 '1 4 4 3  '$ Orientation to Place '5 5 5 5 5  '$ Registration '3 3 3 3 3  '$ Attention/ Calculation '5 4 3 5 5  '$ Recall 0 0 0 0 0  Language- name 2 objects '2 2 2 2 2  '$ Language- repeat '1 1 1 1 1  '$ Language- follow 3 step command '3 3 3 3 3  '$ Language- read & follow direction '1 1 1 1 1  '$ Write a sentence '1 1 1 1 1  '$ Copy design '1 1 1 1 1  '$ Copy design-comments     7 animals  Total  score '22 22 24 26 25        '$ 09/26/2022    3:25 PM 01/01/2021    9:27 AM  6CIT Screen  What Year? 0 points 0 points  What month? 0 points 0 points  What time? 0 points 0 points  Count back from 20 0 points 2 points  Months in reverse 2 points 0 points  Repeat phrase 4 points 10 points  Total Score 6 points 12 points    Immunizations Immunization History  Administered Date(s) Administered   Fluad Quad(high Dose 65+) 05/11/2020, 07/11/2022   Influenza,inj,Quad PF,6+ Mos 04/17/2014, 04/10/2015, 04/13/2017   Influenza-Unspecified 05/03/2016, 04/05/2018, 04/19/2019, 06/04/2021   Moderna Sars-Covid-2 Vaccination 08/16/2019, 09/16/2019   Pneumococcal Conjugate-13 05/24/2014   Pneumococcal Polysaccharide-23 11/09/2017   Td 05/24/2014   Zoster, Live 11/10/2015    TDAP status: Up to date  Flu Vaccine status: Up to date  Pneumococcal vaccine status: Up to date  Covid-19 vaccine status: Information provided on how to obtain vaccines.   Qualifies for Shingles Vaccine? Yes   Zostavax completed Yes   Shingrix Completed?: No.    Education has been provided regarding the importance of this vaccine. Patient has been advised to call insurance company to determine out of pocket expense if they have not yet received this vaccine. Advised may also receive vaccine at local pharmacy or Health Dept. Verbalized acceptance and understanding.  Screening Tests Health Maintenance  Topic Date Due   Zoster Vaccines- Shingrix (1 of 2) Never done   COVID-19 Vaccine (3 - Moderna risk series) 10/14/2019   Medicare Annual Wellness (AWV)  09/27/2023   DTaP/Tdap/Td (2 - Tdap) 05/24/2024   Pneumonia Vaccine 71+ Years old  Completed   INFLUENZA VACCINE  Completed   DEXA SCAN  Completed   HPV VACCINES  Aged Out    Health Maintenance  Health Maintenance Due  Topic Date Due   Zoster Vaccines- Shingrix (1 of 2) Never done   COVID-19 Vaccine (3 - Moderna risk series) 10/14/2019    Colorectal cancer  screening: No longer required.   Mammogram status: No longer required due to age.  Bone Density status: Completed 01/23/16. Results reflect: Bone density results: OSTEOPOROSIS. Repeat every 2 years.  Want to discuss repeat with provider   Lung Cancer Screening: (Low Dose CT Chest recommended if Age 64-80 years, 30 pack-year currently smoking OR have quit w/in 15years.) does not qualify.   Lung Cancer Screening Referral: n/a  Additional Screening:  Hepatitis C Screening: does not qualify  Vision Screening: Recommended annual ophthalmology exams for early detection of glaucoma and other disorders of the eye. Is the patient up to date with their annual eye exam?  Yes  Who is the provider or what is the name of the office in which the patient attends annual eye exams? Dr. Delman Cheadle  If pt is not  established with a provider, would they like to be referred to a provider to establish care? No .   Dental Screening: Recommended annual dental exams for proper oral hygiene  Community Resource Referral / Chronic Care Management: CRR required this visit?  No   CCM required this visit?  No      Plan:     I have personally reviewed and noted the following in the patient's chart:   Medical and social history Use of alcohol, tobacco or illicit drugs  Current medications and supplements including opioid prescriptions. Patient is not currently taking opioid prescriptions. Functional ability and status Nutritional status Physical activity Advanced directives List of other physicians Hospitalizations, surgeries, and ER visits in previous 12 months Vitals Screenings to include cognitive, depression, and falls Referrals and appointments  In addition, I have reviewed and discussed with patient certain preventive protocols, quality metrics, and best practice recommendations. A written personalized care plan for preventive services as well as general preventive health recommendations were provided to  patient.     Denman George [place], [place]   [phone]   Nurse Notes: No concerns

## 2022-09-26 NOTE — Patient Instructions (Addendum)
Jeanne Rogers , Thank you for taking time to come for your Medicare Wellness Visit. I appreciate your ongoing commitment to your health goals. Please review the following plan we discussed and let me know if I can assist you in the future.   These are the goals we discussed:  Goals      Patient Stated     I would like to stay healthy      Prevent falls        This is a list of the screening recommended for you and due dates:  Health Maintenance  Topic Date Due   Zoster (Shingles) Vaccine (1 of 2) Never done   COVID-19 Vaccine (3 - Moderna risk series) 10/14/2019   Medicare Annual Wellness Visit  09/27/2023   DTaP/Tdap/Td vaccine (2 - Tdap) 05/24/2024   Pneumonia Vaccine  Completed   Flu Shot  Completed   DEXA scan (bone density measurement)  Completed   HPV Vaccine  Aged Out    Advanced directives: Please bring a copy of your health care power of attorney and living will to the office to be added to your chart at your convenience.   Conditions/risks identified: Aim for 30 minutes of exercise or brisk walking, 6-8 glasses of water, and 5 servings of fruits and vegetables each day.   Next appointment: Follow up in one year for your annual wellness visit    Preventive Care 65 Years and Older, Female Preventive care refers to lifestyle choices and visits with your health care provider that can promote health and wellness. What does preventive care include? A yearly physical exam. This is also called an annual well check. Dental exams once or twice a year. Routine eye exams. Ask your health care provider how often you should have your eyes checked. Personal lifestyle choices, including: Daily care of your teeth and gums. Regular physical activity. Eating a healthy diet. Avoiding tobacco and drug use. Limiting alcohol use. Practicing safe sex. Taking low-dose aspirin every day. Taking vitamin and mineral supplements as recommended by your health care provider. What happens during  an annual well check? The services and screenings done by your health care provider during your annual well check will depend on your age, overall health, lifestyle risk factors, and family history of disease. Counseling  Your health care provider may ask you questions about your: Alcohol use. Tobacco use. Drug use. Emotional well-being. Home and relationship well-being. Sexual activity. Eating habits. History of falls. Memory and ability to understand (cognition). Work and work Statistician. Reproductive health. Screening  You may have the following tests or measurements: Height, weight, and BMI. Blood pressure. Lipid and cholesterol levels. These may be checked every 5 years, or more frequently if you are over 47 years old. Skin check. Lung cancer screening. You may have this screening every year starting at age 72 if you have a 30-pack-year history of smoking and currently smoke or have quit within the past 15 years. Fecal occult blood test (FOBT) of the stool. You may have this test every year starting at age 108. Flexible sigmoidoscopy or colonoscopy. You may have a sigmoidoscopy every 5 years or a colonoscopy every 10 years starting at age 39. Hepatitis C blood test. Hepatitis B blood test. Sexually transmitted disease (STD) testing. Diabetes screening. This is done by checking your blood sugar (glucose) after you have not eaten for a while (fasting). You may have this done every 1-3 years. Bone density scan. This is done to screen for osteoporosis. You  may have this done starting at age 16. Mammogram. This may be done every 1-2 years. Talk to your health care provider about how often you should have regular mammograms. Talk with your health care provider about your test results, treatment options, and if necessary, the need for more tests. Vaccines  Your health care provider may recommend certain vaccines, such as: Influenza vaccine. This is recommended every year. Tetanus,  diphtheria, and acellular pertussis (Tdap, Td) vaccine. You may need a Td booster every 10 years. Zoster vaccine. You may need this after age 83. Pneumococcal 13-valent conjugate (PCV13) vaccine. One dose is recommended after age 36. Pneumococcal polysaccharide (PPSV23) vaccine. One dose is recommended after age 58. Talk to your health care provider about which screenings and vaccines you need and how often you need them. This information is not intended to replace advice given to you by your health care provider. Make sure you discuss any questions you have with your health care provider. Document Released: 08/17/2015 Document Revised: 04/09/2016 Document Reviewed: 05/22/2015 Elsevier Interactive Patient Education  2017 [employer] Prevention in the Home Falls can cause injuries. They can happen to people of all ages. There are many things you can do to make your home safe and to help prevent falls. What can I do on the outside of my home? Regularly fix the edges of walkways and driveways and fix any cracks. Remove anything that might make you trip as you walk through a door, such as a raised step or threshold. Trim any bushes or trees on the path to your home. Use bright outdoor lighting. Clear any walking paths of anything that might make someone trip, such as rocks or tools. Regularly check to see if handrails are loose or broken. Make sure that both sides of any steps have handrails. Any raised decks and porches should have guardrails on the edges. Have any leaves, snow, or ice cleared regularly. Use sand or salt on walking paths during winter. Clean up any spills in your garage right away. This includes oil or grease spills. What can I do in the bathroom? Use night lights. Install grab bars by the toilet and in the tub and shower. Do not use towel bars as grab bars. Use non-skid mats or decals in the tub or shower. If you need to sit down in the shower, use a plastic,  non-slip stool. Keep the floor dry. Clean up any water that spills on the floor as soon as it happens. Remove soap buildup in the tub or shower regularly. Attach bath mats securely with double-sided non-slip rug tape. Do not have throw rugs and other things on the floor that can make you trip. What can I do in the bedroom? Use night lights. Make sure that you have a light by your bed that is easy to reach. Do not use any sheets or blankets that are too big for your bed. They should not hang down onto the floor. Have a firm chair that has side arms. You can use this for support while you get dressed. Do not have throw rugs and other things on the floor that can make you trip. What can I do in the kitchen? Clean up any spills right away. Avoid walking on wet floors. Keep items that you use a lot in easy-to-reach places. If you need to reach something above you, use a strong step stool that has a grab bar. Keep electrical cords out of the way. Do not use floor  polish or wax that makes floors slippery. If you must use wax, use non-skid floor wax. Do not have throw rugs and other things on the floor that can make you trip. What can I do with my stairs? Do not leave any items on the stairs. Make sure that there are handrails on both sides of the stairs and use them. Fix handrails that are broken or loose. Make sure that handrails are as long as the stairways. Check any carpeting to make sure that it is firmly attached to the stairs. Fix any carpet that is loose or worn. Avoid having throw rugs at the top or bottom of the stairs. If you do have throw rugs, attach them to the floor with carpet tape. Make sure that you have a light switch at the top of the stairs and the bottom of the stairs. If you do not have them, ask someone to add them for you. What else can I do to help prevent falls? Wear shoes that: Do not have high heels. Have rubber bottoms. Are comfortable and fit you well. Are closed  at the toe. Do not wear sandals. If you use a stepladder: Make sure that it is fully opened. Do not climb a closed stepladder. Make sure that both sides of the stepladder are locked into place. Ask someone to hold it for you, if possible. Clearly mark and make sure that you can see: Any grab bars or handrails. First and last steps. Where the edge of each step is. Use tools that help you move around (mobility aids) if they are needed. These include: Canes. Walkers. Scooters. Crutches. Turn on the lights when you go into a dark area. Replace any light bulbs as soon as they burn out. Set up your furniture so you have a clear path. Avoid moving your furniture around. If any of your floors are uneven, fix them. If there are any pets around you, be aware of where they are. Review your medicines with your doctor. Some medicines can make you feel dizzy. This can increase your chance of falling. Ask your doctor what other things that you can do to help prevent falls. This information is not intended to replace advice given to you by your health care provider. Make sure you discuss any questions you have with your health care provider. Document Released: 05/17/2009 Document Revised: 12/27/2015 Document Reviewed: 08/25/2014 Elsevier Interactive Patient Education  2017 [employer].

## 2022-11-12 ENCOUNTER — Other Ambulatory Visit: Payer: Self-pay | Admitting: Cardiovascular Disease

## 2022-12-01 ENCOUNTER — Ambulatory Visit (HOSPITAL_COMMUNITY)
Admission: RE | Admit: 2022-12-01 | Discharge: 2022-12-01 | Disposition: A | Discharge status: Home / Self Care | Payer: Medicare Other | Source: Ambulatory Visit | Attending: Family Medicine | Admitting: Family Medicine

## 2022-12-01 ENCOUNTER — Ambulatory Visit (INDEPENDENT_AMBULATORY_CARE_PROVIDER_SITE_OTHER): Payer: Medicare Other | Admitting: Family Medicine

## 2022-12-01 VITALS — BP 138/82 | HR 87 | Temp 97.6°F | Ht 64.0 in | Wt 125.0 lb

## 2022-12-01 DIAGNOSIS — M545 Low back pain, unspecified: Secondary | ICD-10-CM | POA: Insufficient documentation

## 2022-12-01 DIAGNOSIS — R9389 Abnormal findings on diagnostic imaging of other specified body structures: Secondary | ICD-10-CM

## 2022-12-01 DIAGNOSIS — M546 Pain in thoracic spine: Secondary | ICD-10-CM | POA: Insufficient documentation

## 2022-12-01 DIAGNOSIS — M5134 Other intervertebral disc degeneration, thoracic region: Secondary | ICD-10-CM | POA: Diagnosis not present

## 2022-12-01 MED ORDER — TRAMADOL HCL 50 MG PO TABS: 50.0000 mg | ORAL_TABLET | Freq: Two times a day (BID) | ORAL | 0 refills | Status: DC | PRN

## 2022-12-01 NOTE — Patient Instructions (Signed)
Xrays for evaluation.  Medication as prescribed.  We will call with results.

## 2022-12-01 NOTE — Progress Notes (Signed)
Subjective:  Patient ID: Jeanne Rogers, female    DOB: 1941/03/03  Age: 82 y.o. MRN: [id number]  CC: Back pain   HPI:  82 year old female presents for evaluation of back pain.  Started on Saturday or Sunday.  Lower thoracic spine into the lumbar spine.  Has been states that she did a lot of standing recently.  Patient also states that she was helping out a friend with strawberries.  No fall, trauma, injury.  Pain was severe earlier.  Ibuprofen has helped some.  Pain is mild currently.  She states that when she provides pressure to the area it is improved.  No reports of radicular symptoms.  No other complaints or concerns at this time.  Patient Active Problem List   Diagnosis Date Noted   Thoracolumbar back pain 12/01/2022   Iron deficiency anemia 08/07/2022   Actinic keratosis 08/07/2022   Stage 3a chronic kidney disease (HCC) 03/27/2022   History of non-ST elevation myocardial infarction (NSTEMI) 03/26/2022   Takotsubo cardiomyopathy    Mild cognitive impairment 10/11/2019   Hyperlipidemia 08/26/2019   Osteoporosis 06/29/2013    Social Hx   Social History   Socioeconomic History   Marital status: Married    Spouse name: Not on file   Number of children: Not on file   Years of education: Not on file   Highest education level: Not on file  Occupational History   Not on file  Tobacco Use   Smoking status: Former    Types: Cigarettes    Quit date: 10/02/1988    Years since quitting: 34.1   Smokeless tobacco: Never  Vaping Use   Vaping Use: Never used  Substance and Sexual Activity   Alcohol use: No   Drug use: No   Sexual activity: Not Currently  Other Topics Concern   Not on file  Social History Narrative   Not on file   Social Determinants of Health   Financial Resource Strain: Low Risk  (09/26/2022)   Overall Financial Resource Strain (CARDIA)    Difficulty of Paying Living Expenses: Not hard at all  Food Insecurity: No Food Insecurity (09/26/2022)    Hunger Vital Sign    Worried About Running Out of Food in the Last Year: Never true    Ran Out of Food in the Last Year: Never true  Transportation Needs: No Transportation Needs (09/26/2022)   PRAPARE - Administrator, Civil Service (Medical): No    Lack of Transportation (Non-Medical): No  Physical Activity: Insufficiently Active (09/26/2022)   Exercise Vital Sign    Days of Exercise per Week: 2 days    Minutes of Exercise per Session: 30 min  Stress: No Stress Concern Present (09/26/2022)   [employer] of Occupational Health - Occupational Stress Questionnaire    Feeling of Stress : Only a little  Social Connections: Socially Integrated (09/26/2022)   Social Connection and Isolation Panel [NHANES]    Frequency of Communication with Friends and Family: More than three times a week    Frequency of Social Gatherings with Friends and Family: Twice a week    Attends Religious Services: More than 4 times per year    Active Member of [employer] or Organizations: Yes    Attends Engineer, structural: More than 4 times per year    Marital Status: Married    Review of Systems Per HPI  Objective:  BP 138/82   Pulse 87   Temp 97.6 F (36.4 C)  Ht 5\' 4"  (1.626 m)   Wt 125 lb (56.7 kg)   SpO2 96%   BMI 21.46 kg/m      12/01/2022   11:36 AM 09/26/2022    2:31 PM 08/07/2022    9:16 AM  BP/Weight  Systolic BP 138 128 135  Diastolic BP 82 72 73  Wt. (Lbs) 125 126 122.8  BMI 21.46 kg/m2 21.63 kg/m2 21.08 kg/m2    Physical Exam Vitals and nursing note reviewed.  Constitutional:      General: She is not in acute distress.    Appearance: Normal appearance.  HENT:     Head: Normocephalic and atraumatic.  Eyes:     General:        Right eye: No discharge.        Left eye: No discharge.     Conjunctiva/sclera: Conjunctivae normal.  Cardiovascular:     Rate and Rhythm: Normal rate and regular rhythm.  Pulmonary:     Effort: Pulmonary effort is normal.      Breath sounds: Normal breath sounds.  Musculoskeletal:     Comments: No tenderness of the thoracic or lumbar spine.  Kyphosis noted.  Neurological:     Mental Status: She is alert.  Psychiatric:        Mood and Affect: Mood normal.     Lab Results  Component Value Date   WBC 6.9 08/07/2022   HGB 12.2 08/07/2022   HCT 40.5 08/07/2022   PLT 456 (H) 08/07/2022   GLUCOSE 86 08/07/2022   CHOL 242 (H) 09/25/2021   TRIG 131 09/25/2021   HDL 76 09/25/2021   LDLCALC 143 (H) 09/25/2021   ALT 13 01/21/2022   AST 18 01/21/2022   NA 143 08/07/2022   K 4.0 08/07/2022   CL 102 08/07/2022   CREATININE 0.97 08/07/2022   BUN 18 08/07/2022   CO2 24 08/07/2022   TSH 2.521 08/25/2019   INR 1.0 08/24/2019   HGBA1C 5.4 05/10/2020     Assessment & Plan:   Problem List Items Addressed This Visit       Other   Thoracolumbar back pain - Primary    X-rays today for further evaluation.  Tramadol as needed.      Relevant Medications   traMADol (ULTRAM) 50 MG tablet   Other Relevant Orders   DG Thoracic Spine 2 View   DG Lumbar Spine Complete    Meds ordered this encounter  Medications   traMADol (ULTRAM) 50 MG tablet    Sig: Take 1 tablet (50 mg total) by mouth every 12 (twelve) hours as needed for moderate pain or severe pain.    Dispense:  10 tablet    Refill:  0    Follow-up:  Pending xray findings.  Everlene Other DO [facility]

## 2022-12-01 NOTE — Assessment & Plan Note (Signed)
X-rays today for further evaluation.  Tramadol as needed.

## 2022-12-08 ENCOUNTER — Other Ambulatory Visit: Payer: Self-pay | Admitting: Family Medicine

## 2022-12-08 ENCOUNTER — Other Ambulatory Visit: Payer: Self-pay | Admitting: Cardiovascular Disease

## 2022-12-08 DIAGNOSIS — M546 Pain in thoracic spine: Secondary | ICD-10-CM

## 2022-12-09 ENCOUNTER — Other Ambulatory Visit: Payer: Self-pay | Admitting: Cardiovascular Disease

## 2022-12-09 NOTE — Telephone Encounter (Signed)
Husband calling to check the status of this refill request.

## 2022-12-19 ENCOUNTER — Other Ambulatory Visit: Payer: Self-pay | Admitting: Cardiovascular Disease

## 2022-12-22 ENCOUNTER — Other Ambulatory Visit: Payer: Self-pay | Admitting: Cardiovascular Disease

## 2022-12-22 ENCOUNTER — Other Ambulatory Visit: Payer: Self-pay | Admitting: Family Medicine

## 2022-12-22 DIAGNOSIS — E785 Hyperlipidemia, unspecified: Secondary | ICD-10-CM

## 2022-12-23 ENCOUNTER — Encounter: Payer: Self-pay | Admitting: *Deleted

## 2022-12-23 NOTE — Addendum Note (Signed)
Addended by: Margaretha Sheffield on: 12/23/2022 03:20 PM   Modules accepted: Orders

## 2022-12-25 DIAGNOSIS — M4856XA Collapsed vertebra, not elsewhere classified, lumbar region, initial encounter for fracture: Secondary | ICD-10-CM | POA: Diagnosis not present

## 2022-12-25 DIAGNOSIS — M5459 Other low back pain: Secondary | ICD-10-CM | POA: Diagnosis not present

## 2022-12-25 DIAGNOSIS — M5451 Vertebrogenic low back pain: Secondary | ICD-10-CM | POA: Diagnosis not present

## 2022-12-27 DIAGNOSIS — M5416 Radiculopathy, lumbar region: Secondary | ICD-10-CM | POA: Diagnosis not present

## 2022-12-27 DIAGNOSIS — M5459 Other low back pain: Secondary | ICD-10-CM | POA: Diagnosis not present

## 2022-12-30 ENCOUNTER — Other Ambulatory Visit (HOSPITAL_COMMUNITY): Payer: Medicare Other

## 2023-01-06 ENCOUNTER — Telehealth: Payer: Self-pay | Admitting: Cardiovascular Disease

## 2023-01-06 ENCOUNTER — Telehealth: Payer: Self-pay

## 2023-01-06 DIAGNOSIS — M4856XA Collapsed vertebra, not elsewhere classified, lumbar region, initial encounter for fracture: Secondary | ICD-10-CM | POA: Diagnosis not present

## 2023-01-06 NOTE — Telephone Encounter (Signed)
Patient dropped off document  Surgical Jeanne Rogers  , to be filled out by provider. Patient requested to send it via Fax within 7-days. Document is located in providers tray at front office.Please advise at Mobile [phone] (mobile)

## 2023-01-06 NOTE — Telephone Encounter (Signed)
   Pre-operative Risk Assessment    Patient Name: Jeanne Rogers  DOB: 02-Jul-1941 MRN: [id number]{     Request for Surgical Clearance    Procedure:   lumbar kyphoplasty  Date of Surgery:  Clearance TBD                                 Surgeon:  Dr. George Ina Group or Practice Name:  [facility]  Phone number:  [phone] Fax number:  [phone] attn Madlyn Frankel   Type of Clearance Requested:   - Medical    Type of Anesthesia:  Not Indicated   Additional requests/questions:  Please fax a copy of clearance to the surgeon's office.  Signed, Vallarie Mare   01/06/2023, 4:23 PM

## 2023-01-07 NOTE — Telephone Encounter (Signed)
I will update the requesting office the pt last seen by cardiology 03/11/21. Pt needs in office appt. At this time pt has appt with Dr. Eden Emms 03/03/23. I will see if we are able to bring the pt in sooner as pre op clearance is needed as well.

## 2023-01-07 NOTE — Telephone Encounter (Signed)
   Name: Jeanne Rogers  DOB: 1941-06-01  MRN: [id number]  Primary Cardiologist: None  Chart reviewed as part of pre-operative protocol coverage. Because of [name]'s past medical history and time since last visit, she will require a follow-up in-office visit in order to better assess preoperative cardiovascular risk.  Pre-op covering staff: - Please schedule appointment and call patient to inform them. If patient already had an upcoming appointment within acceptable timeframe, please add "pre-op clearance" to the appointment notes so provider is aware. - Please contact requesting surgeon's office via preferred method (i.e, phone, fax) to inform them of need for appointment prior to surgery.   Carlos Levering, NP  01/07/2023, 9:15 AM

## 2023-01-07 NOTE — Telephone Encounter (Signed)
AMENDUM TO CLEARANCE:   CLEARANCE WAS ENTERED IN AS TBD YESTERDAY.  CLEARANCE REQUEST WAS FAXED TO OUR OFFICE TODAY WITH THE DATE 01/09/23. I WILL UPDATE THE PRE OP APP.

## 2023-01-07 NOTE — Telephone Encounter (Signed)
Pt has appt 01/08/23 with Sharlene Dory, NP for pre op clearance. I will update all parties involved.

## 2023-01-07 NOTE — Telephone Encounter (Signed)
I have sent a message to our [place] scheduling team to see if they may be able to find a sooner appt. However, procedure will need to be post poned until the pt has been seen and cleared by cardiology.

## 2023-01-08 ENCOUNTER — Encounter: Payer: Self-pay | Admitting: Nurse Practitioner

## 2023-01-08 ENCOUNTER — Ambulatory Visit: Payer: Medicare Other | Attending: Student | Admitting: Nurse Practitioner

## 2023-01-08 VITALS — BP 142/82 | HR 79 | Ht 63.0 in | Wt 123.2 lb

## 2023-01-08 DIAGNOSIS — I5032 Chronic diastolic (congestive) heart failure: Secondary | ICD-10-CM

## 2023-01-08 DIAGNOSIS — I10 Essential (primary) hypertension: Secondary | ICD-10-CM | POA: Insufficient documentation

## 2023-01-08 DIAGNOSIS — I5181 Takotsubo syndrome: Secondary | ICD-10-CM | POA: Insufficient documentation

## 2023-01-08 DIAGNOSIS — Z01810 Encounter for preprocedural cardiovascular examination: Secondary | ICD-10-CM

## 2023-01-08 DIAGNOSIS — Z79899 Other long term (current) drug therapy: Secondary | ICD-10-CM | POA: Diagnosis not present

## 2023-01-08 DIAGNOSIS — E782 Mixed hyperlipidemia: Secondary | ICD-10-CM | POA: Diagnosis not present

## 2023-01-08 DIAGNOSIS — I252 Old myocardial infarction: Secondary | ICD-10-CM | POA: Insufficient documentation

## 2023-01-08 DIAGNOSIS — Z8673 Personal history of transient ischemic attack (TIA), and cerebral infarction without residual deficits: Secondary | ICD-10-CM | POA: Diagnosis not present

## 2023-01-08 MED ORDER — ISOSORBIDE MONONITRATE ER 30 MG PO TB24: ORAL_TABLET | ORAL | 1 refills | Status: DC

## 2023-01-08 NOTE — Patient Instructions (Signed)
Medication Instructions:  Your physician recommends that you continue on your current medications as directed. Please refer to the Current Medication list given to you today.  Labwork: Your physician recommends that you return for a FASTING lipid & liver profile in 3-4 weeks. Please do not eat or drink for at least 8 hours when you have this done. You may take your medications that morning with a sip of water. [facility]  Testing/Procedures: none  Follow-Up: Your physician recommends that you schedule a follow-up appointment in: 6 months  Any Other Special Instructions Will Be Listed Below (If Applicable).  If you need a refill on your cardiac medications before your next appointment, please call your pharmacy.

## 2023-01-08 NOTE — Progress Notes (Signed)
Office Visit    Patient Name: Jeanne Rogers Date of Encounter: 01/08/2023  PCP:  Tommie Sams DO   [facility]  Cardiologist:  Charlton Haws, MD  Advanced Practice Provider:  Sharlene Dory, NP Electrophysiologist:  None   Chief Complaint    Jeanne Rogers is a 82 y.o. female with a hx of Takotsubo cardiomyopathy, history of NSTEMI, hypertension, remote TIA, hyperlipidemia, history of spontaneous pneumothorax, history of mild dementia, who presents today for pre-operative cardiovascular risk assessment.    Past Medical History    Past Medical History:  Diagnosis Date   Anemia    Collapse, lung    Complication of anesthesia    very sensitive to anesthesia   GERD (gastroesophageal reflux disease)    Hyperlipidemia    Hypertension    Osteoporosis    PONV (postoperative nausea and vomiting)    PUD (peptic ulcer disease)    Spontaneous pneumothorax    Past Surgical History:  Procedure Laterality Date   ABDOMINAL HYSTERECTOMY     COLONOSCOPY N/A 01/04/2014   Procedure: COLONOSCOPY;  Surgeon: Malissa Hippo, MD;  Location: [facility];  Service: Endoscopy;  Laterality: N/A;  200   ESOPHAGOGASTRODUODENOSCOPY N/A 01/04/2014   Procedure: ESOPHAGOGASTRODUODENOSCOPY (EGD);  Surgeon: Malissa Hippo, MD;  Location: [facility];  Service: Endoscopy;  Laterality: N/A;   hysterectomy     KYPHOPLASTY N/A 08/08/2020   Procedure: KYPHOPLASTY THORACIC EIGHT AND THORACIC TWELVE;  Surgeon: Venita Lick, MD;  Location: [facility] OR;  Service: Orthopedics;  Laterality: N/A;   LEFT HEART CATH AND CORONARY ANGIOGRAPHY N/A 05/10/2020   Procedure: LEFT HEART CATH AND CORONARY ANGIOGRAPHY;  Surgeon: Lyn Records, MD;  Location: [facility];  Service: Cardiovascular;  Laterality: N/A;   MASS EXCISION Right 02/07/2016   Procedure: EXCISION MASS RIGHT INDEX FINGER AND RIGHT HAND MASS;  Surgeon: Betha Loa, MD;  Location: [facility];  Service:  Orthopedics;  Laterality: Right;   OOPHORECTOMY Bilateral 08/05/2003   SPINE SURGERY      Allergies  Allergies  Allergen Reactions   Clopidogrel     Pain in thighs    Methergine [Methylergonovine Maleate] Other (See Comments)    Unknown   Statins Other (See Comments)    Myalgia    History of Present Illness    Jeanne Rogers is a very pleasant 82 y.o. female with a PMH as mentioned above.  Previous cardiovascular history includes NSTEMI in 2021.  Cardiac catheterization revealed normal coronary arteries.  Echocardiogram at that time revealed EF 30 to 35%, was determined to be due to Takotsubo cardiomyopathy.  She has had limited GDMT due to low blood pressures.  Repeat echocardiogram in 2022 showed recovered EF at 65 to 70%.  Last seen by Dr. Eden Emms on March 11, 2021.  She was overall doing well from a cardiac perspective.  She presents today for preoperative cardiovascular risk assessment.  She is pending lumbar kyphoplasty with Dr. Shon Baton at [facility].  Our office has been contacted regarding medical clearance.  Date of surgery is TBD. Today she presents with her husband who helps as historian d/t patient's hx of mild dementia. Doing great and no cardiac complaints or issues. Denies any chest pain, shortness of breath, palpitations, syncope, presyncope, dizziness, orthopnea, PND, swelling or significant weight changes, acute bleeding, or claudication.   SH: Former Physicist, medical member until 2017, daughter is a Therapist, music in [place], 2 children  are engineers, 1 child is the ER doctor at [employer]. Married for nearly 60 years. Enjoys playing cards with her husband in her free time.   EKGs/Labs/Other Studies Reviewed:   The following studies were reviewed today:   EKG:  EKG is ordered today.  The ekg ordered today demonstrates NSR, 79 bpm, nonspecific ST abnormality (chronic), no acute ischemic changes.   Echo 08/2020:   1. Left ventricular ejection fraction, by  estimation, is 65 to 70%. The  left ventricle has normal function. The left ventricle has no regional  wall motion abnormalities. There is mild left ventricular hypertrophy.  Left ventricular diastolic parameters  are consistent with Grade I diastolic dysfunction (impaired relaxation).   2. Right ventricular systolic function is normal. The right ventricular  size is normal. There is normal pulmonary artery systolic pressure.   3. The mitral valve is normal in structure. No evidence of mitral valve  regurgitation. No evidence of mitral stenosis.   4. The aortic valve is tricuspid. Aortic valve regurgitation is not  visualized. No aortic stenosis is present.   5. The inferior vena cava is normal in size with greater than 50%  respiratory variability, suggesting right atrial pressure of 3 mmHg.  LHC 05/2020:  Right dominant coronary anatomy Widely patent left main Widely patent LAD Widely patent circumflex Widely patent RCA Coronary tortuosity Apical ballooning/Takotsubo cardiomyopathy with LVEDP 6 mmHg.  EF 30 to 35%.   RECOMMENDATIONS:   Supportive therapy Wean and DC nitro as tolerated given low LVEDP. Convert anticoagulation to subcu.  Echo 05/2020:   1. Left ventricular ejection fraction, by estimation, is 30 to 35%. The  left ventricle has moderately decreased function. The left ventricle  demonstrates regional wall motion abnormalities with mid to apical  anteroseptal and inferoseptal akinesis.  Apical lateral, apical anterior, and apical inferior akinesis. Akinesis of  the true apex. This pattern suggests LAD territory infarction. No LV  thrombus visualized. There is mild left ventricular hypertrophy. Left  ventricular diastolic parameters are  consistent with Grade I diastolic dysfunction (impaired relaxation).   2. Right ventricular systolic function is normal. The right ventricular  size is normal. There is mildly elevated pulmonary artery systolic  pressure. The  estimated right ventricular systolic pressure is 36.9 mmHg.   3. The mitral valve is normal in structure. No evidence of mitral valve  regurgitation. No evidence of mitral stenosis.   4. The aortic valve is tricuspid. Aortic valve regurgitation is not  visualized. No aortic stenosis is present.   5. A small pericardial effusion is present, primarily adjacent to the RV  free wall.   6. The inferior vena cava is normal in size with <50% respiratory  variability, suggesting right atrial pressure of 8 mmHg.   Recent Labs: 01/21/2022: ALT 13 08/07/2022: BUN 18; Creatinine, Ser 0.97; Hemoglobin 12.2; Platelets 456; Potassium 4.0; Sodium 143  Recent Lipid Panel    Component Value Date/Time   CHOL 242 (H) 09/25/2021 1411   TRIG 131 09/25/2021 1411   HDL 76 09/25/2021 1411   CHOLHDL 3.2 09/25/2021 1411   CHOLHDL 3.3 05/10/2020 1034   VLDL 21 05/10/2020 1034   LDLCALC 143 (H) 09/25/2021 1411      Home Medications   Current Meds  Medication Sig   acetaminophen (TYLENOL) 500 MG tablet Take 1,000 mg by mouth every 6 (six) hours as needed for moderate pain.   aspirin EC 81 MG tablet Take 81 mg by mouth daily. Swallow whole.   citalopram (CELEXA)  10 MG tablet Take 1 tablet (10 mg total) by mouth daily.   ezetimibe (ZETIA) 10 MG tablet TAKE 1 TABLET(10 MG) BY MOUTH DAILY   memantine (NAMENDA) 10 MG tablet Take 1 tablet (10 mg total) by mouth 2 (two) times daily.   metoprolol succinate (TOPROL-XL) 25 MG 24 hr tablet TAKE 1/2 TABLET BY MOUTH EVERY DAY   traMADol (ULTRAM) 50 MG tablet TAKE 1 TABLET(50 MG) BY MOUTH EVERY 12 HOURS AS NEEDED FOR MODERATE PAIN OR SEVERE PAIN   triamcinolone ointment (KENALOG) 0.5 % Apply 1 Application topically daily as needed.    isosorbide mononitrate (IMDUR) 30 MG 24 hr tablet TAKE 1 TABLET(30 MG) BY MOUTH DAILY     Review of Systems    All other systems reviewed and are otherwise negative except as noted above.  Physical Exam    VS:  BP (!) 142/82   Pulse  79   Ht 5\' 3"  (1.6 m)   Wt 123 lb 3.2 oz (55.9 kg)   SpO2 98%   BMI 21.82 kg/m  , BMI Body mass index is 21.82 kg/m.  Wt Readings from Last 3 Encounters:  01/08/23 123 lb 3.2 oz (55.9 kg)  12/01/22 125 lb (56.7 kg)  09/26/22 126 lb (57.2 kg)     GEN: Thin, frail 82 y.o. female in no acute distress. HEENT: normal. Neck: Supple, no JVD, carotid bruits, or masses. Cardiac: S1/S2, RRR, no murmurs, rubs, or gallops. No clubbing, cyanosis, edema.  Radials/PT 2+ and equal bilaterally.  Respiratory:  Respirations regular and unlabored, clear to auscultation bilaterally. MS: No deformity or atrophy. Skin: Thin skin, warm and dry, no rash. Neuro:  Strength and sensation are intact. Psych: Normal affect.  Assessment & Plan    Pre-operative cardiovascular risk assessement6} Ms. Whiten's perioperative risk of a major cardiac event is 11% according to the Revised Cardiac Risk Index (RCRI).  Therefore, she is at high risk for perioperative complications.   Her functional capacity is excellent at 6.61 METs according to the Duke Activity Status Index (DASI). Recommendations: According to ACC/AHA guidelines, no further cardiovascular testing needed.  The patient may proceed to surgery at acceptable risk.   Antiplatelet and/or Anticoagulation Recommendations: Okay to hold aspirin 7 days prior to surgery and then resume when felt safe to do so post procedure.   2. Hx of Takotsubo cardiomyopathy, HFrecEF Echo in 2022 showed recovered EF. Euvolemic and well compensated on exam. Continue current medication regimen. Low sodium diet, fluid restriction <2L, and daily weights encouraged. Educated to contact our office for weight gain of 2 lbs overnight or 5 lbs in one week. Heart healthy diet and regular cardiovascular exercise encouraged.   3. Hx of NSTEMI LHC in 2021 showed normal coronaries. Stable with no anginal symptoms. No indication for ischemic evaluation. Continue current medication regimen. Will  refill Imdur per her request. Heart healthy diet and regular cardiovascular exercise encouraged.   4. HLD, remote TIA, medication management Hx of statin intolerance. Husband reports prior hx of TIA many years ago. Last LDL not at goal. Would not be a good candidate for PCSK9i d/t dementia. Will obtain FLP and LFT in 3-4 weeks and discuss Levquio in the future. Heart healthy diet and regular cardiovascular exercise encouraged.   5. HTN BP on arrival 142/82, repeat BP 135/80. Goal SBP < 140. No medication changes at this time. Discussed to monitor BP at home at least 2 hours after medications and sitting for 5-10 minutes. Heart healthy diet and regular cardiovascular  exercise encouraged.      Disposition: Follow up in 6 month(s) with Charlton Haws, MD or APP.  Signed, Sharlene Dory, NP 01/08/2023, 9:38 AM [facility]

## 2023-01-09 DIAGNOSIS — M8008XA Age-related osteoporosis with current pathological fracture, vertebra(e), initial encounter for fracture: Secondary | ICD-10-CM | POA: Diagnosis not present

## 2023-01-25 ENCOUNTER — Other Ambulatory Visit: Payer: Self-pay | Admitting: Neurology

## 2023-02-11 ENCOUNTER — Ambulatory Visit: Payer: Medicare Other | Admitting: Family Medicine

## 2023-02-23 ENCOUNTER — Ambulatory Visit
Admission: EM | Admit: 2023-02-23 | Discharge: 2023-02-23 | Disposition: A | Discharge status: Home / Self Care | Payer: Medicare Other | Source: Home / Self Care

## 2023-02-23 ENCOUNTER — Ambulatory Visit (INDEPENDENT_AMBULATORY_CARE_PROVIDER_SITE_OTHER): Payer: Medicare Other

## 2023-02-23 DIAGNOSIS — M85812 Other specified disorders of bone density and structure, left shoulder: Secondary | ICD-10-CM | POA: Diagnosis not present

## 2023-02-23 DIAGNOSIS — I700 Atherosclerosis of aorta: Secondary | ICD-10-CM | POA: Diagnosis not present

## 2023-02-23 DIAGNOSIS — M25512 Pain in left shoulder: Secondary | ICD-10-CM

## 2023-02-23 DIAGNOSIS — M47812 Spondylosis without myelopathy or radiculopathy, cervical region: Secondary | ICD-10-CM | POA: Diagnosis not present

## 2023-02-23 DIAGNOSIS — W19XXXA Unspecified fall, initial encounter: Secondary | ICD-10-CM | POA: Diagnosis not present

## 2023-02-23 NOTE — ED Triage Notes (Signed)
Pt slide out of a chair and fell and hurt left shoulder and collar bone. Pt is unable to raise left arm.

## 2023-02-23 NOTE — Discharge Instructions (Signed)
Your x-ray did not show any broken bones today which is great news.  Follow-up with either an orthopedist or your primary care provider within the next week if not significantly improving.  Wear the sling when you are up and moving to help take pressure off of the shoulder and as your pain reduces start doing very gentle range of motion exercises to keep it from freezing up.  Tylenol, tramadol as needed.

## 2023-02-27 NOTE — ED Provider Notes (Signed)
[facility]    CSN: [id number] Arrival date & time: 02/23/23  1907      History   Chief Complaint Chief Complaint  Patient presents with   Shoulder Injury    HPI Jeanne Rogers is a 82 y.o. female.   Patient presenting today with left shoulder and clavicle pain after sliding out of a chair earlier today.  She denies any head injury, loss of consciousness or pain elsewhere from the fall.  Unable to raise the left arm but moving well at the elbow and hand per patient.  So far trying some Tylenol with no relief.    Past Medical History:  Diagnosis Date   Anemia    Collapse, lung    Complication of anesthesia    very sensitive to anesthesia   GERD (gastroesophageal reflux disease)    Hyperlipidemia    Hypertension    Osteoporosis    PONV (postoperative nausea and vomiting)    PUD (peptic ulcer disease)    Spontaneous pneumothorax     Patient Active Problem List   Diagnosis Date Noted   Thoracolumbar back pain 12/01/2022   Iron deficiency anemia 08/07/2022   Actinic keratosis 08/07/2022   Stage 3a chronic kidney disease (HCC) 03/27/2022   History of non-ST elevation myocardial infarction (NSTEMI) 03/26/2022   Takotsubo cardiomyopathy    Mild cognitive impairment 10/11/2019   Hyperlipidemia 08/26/2019   Osteoporosis 06/29/2013    Past Surgical History:  Procedure Laterality Date   ABDOMINAL HYSTERECTOMY     COLONOSCOPY N/A 01/04/2014   Procedure: COLONOSCOPY;  Surgeon: Malissa Hippo, MD;  Location: [facility];  Service: Endoscopy;  Laterality: N/A;  200   ESOPHAGOGASTRODUODENOSCOPY N/A 01/04/2014   Procedure: ESOPHAGOGASTRODUODENOSCOPY (EGD);  Surgeon: Malissa Hippo, MD;  Location: [facility];  Service: Endoscopy;  Laterality: N/A;   hysterectomy     KYPHOPLASTY N/A 08/08/2020   Procedure: KYPHOPLASTY THORACIC EIGHT AND THORACIC TWELVE;  Surgeon: Venita Lick, MD;  Location: [facility];  Service: Orthopedics;  Laterality: N/A;   LEFT HEART  CATH AND CORONARY ANGIOGRAPHY N/A 05/10/2020   Procedure: LEFT HEART CATH AND CORONARY ANGIOGRAPHY;  Surgeon: Lyn Records, MD;  Location: [facility];  Service: Cardiovascular;  Laterality: N/A;   MASS EXCISION Right 02/07/2016   Procedure: EXCISION MASS RIGHT INDEX FINGER AND RIGHT HAND MASS;  Surgeon: Betha Loa, MD;  Location: [facility];  Service: Orthopedics;  Laterality: Right;   OOPHORECTOMY Bilateral 08/05/2003   SPINE SURGERY      OB History   No obstetric history on file.      Home Medications    Prior to Admission medications   Medication Sig Start Date End Date Taking? Authorizing Provider  acetaminophen (TYLENOL) 500 MG tablet Take 1,000 mg by mouth every 6 (six) hours as needed for moderate pain.   Yes [provider]  aspirin EC 81 MG tablet Take 81 mg by mouth daily. Swallow whole.   Yes [provider]  citalopram (CELEXA) 10 MG tablet TAKE 1 TABLET(10 MG) BY MOUTH DAILY 01/26/23  Yes Cook, Jayce G, DO  ezetimibe (ZETIA) 10 MG tablet TAKE 1 TABLET(10 MG) BY MOUTH DAILY 12/25/22  Yes Cook, Jayce G, DO  Iron, Ferrous Sulfate, 325 (65 Fe) MG TABS Take 325 mg by mouth daily. 03/30/22  Yes Cook, Jayce G, DO  isosorbide mononitrate (IMDUR) 30 MG 24 hr tablet TAKE 1 TABLET(30 MG) BY MOUTH DAILY 01/08/23  Yes Sharlene Dory, NP  memantine (NAMENDA) 10 MG tablet TAKE 1 TABLET(10 MG) BY MOUTH TWICE DAILY 01/26/23  Yes Cook, Jayce G, DO  metoprolol succinate (TOPROL-XL) 25 MG 24 hr tablet TAKE 1/2 TABLET BY MOUTH EVERY DAY 12/19/22  Yes Wendall Stade, MD  alendronate (FOSAMAX) 70 MG tablet Take 70 mg by mouth once a week. Patient not taking: Reported on 01/08/2023 04/01/22   [provider]  donepezil (ARICEPT) 5 MG tablet Take 5 mg by mouth daily.    [provider]  traMADol (ULTRAM) 50 MG tablet TAKE 1 TABLET(50 MG) BY MOUTH EVERY 12 HOURS AS NEEDED FOR MODERATE PAIN OR SEVERE PAIN 12/09/22   Tommie Sams, DO  triamcinolone  ointment (KENALOG) 0.5 % Apply 1 Application topically daily as needed. 08/07/22   Tommie Sams, DO    Family History Family History  Problem Relation Age of Onset   Hyperlipidemia Mother    Hypertension Father     Social History Social History   Tobacco Use   Smoking status: Former    Current packs/day: 0.00    Types: Cigarettes    Quit date: 10/02/1988    Years since quitting: 34.4   Smokeless tobacco: Never  Vaping Use   Vaping status: Never Used  Substance Use Topics   Alcohol use: No   Drug use: No     Allergies   Clopidogrel, Methergine [methylergonovine maleate], and Statins   Review of Systems Review of Systems PER HPI  Physical Exam Triage Vital Signs ED Triage Vitals [02/23/23 1917]  Encounter Vitals Group     BP (!) 175/118     Systolic BP Percentile      Diastolic BP Percentile      Pulse Rate (!) 104     Resp 16     Temp (!) 97.2 F (36.2 C)     Temp Source Tympanic     SpO2 90 %     Weight      Height      Head Circumference      Peak Flow      Pain Score 2     Pain Loc      Pain Education      Exclude from Growth Chart    No data found.  Updated Vital Signs BP (!) 164/95 (BP Location: Right Arm)   Pulse (!) 104   Temp (!) 97.2 F (36.2 C) (Tympanic)   Resp 16   SpO2 92%   Visual Acuity Right Eye Distance:   Left Eye Distance:   Bilateral Distance:    Right Eye Near:   Left Eye Near:    Bilateral Near:     Physical Exam Vitals and nursing note reviewed.  Constitutional:      Appearance: Normal appearance. She is not ill-appearing.  HENT:     Head: Atraumatic.  Eyes:     Extraocular Movements: Extraocular movements intact.     Conjunctiva/sclera: Conjunctivae normal.  Cardiovascular:     Rate and Rhythm: Normal rate.  Pulmonary:     Effort: Pulmonary effort is normal.  Musculoskeletal:        General: Tenderness and signs of injury present. No swelling or deformity.     Cervical back: Normal range of motion and neck  supple.     Comments: Decreased range of motion of the left shoulder unclear if due to pain, tender to palpation over distal left clavicle and into diffuse shoulder region on the left.  No deformities palpable  Skin:  General: Skin is warm and dry.     Findings: No bruising or erythema.  Neurological:     Mental Status: She is alert. Mental status is at baseline.     Comments: Bilateral upper extremities neurovascularly intact  Psychiatric:        Mood and Affect: Mood normal.        Thought Content: Thought content normal.        Judgment: Judgment normal.      UC Treatments / Results  Labs (all labs ordered are listed, but only abnormal results are displayed) Labs Reviewed - No data to display  EKG   Radiology No results found.  Procedures Procedures (including critical care time)  Medications Ordered in UC Medications - No data to display  Initial Impression / Assessment and Plan / UC Course  I have reviewed the triage vital signs and the nursing notes.  Pertinent labs & imaging results that were available during my care of the patient were reviewed by me and considered in my medical decision making (see chart for details).     X-ray of the left shoulder negative for acute bony abnormality.  Will place in a sling for comfort and discussed gentle range of motion once improved and follow-up with primary care.  Tylenol, tramadol which significant other who is with her today states she already has some at home as needed for severe pain and RICE protocol.  Final Clinical Impressions(s) / UC Diagnoses   Final diagnoses:  Acute pain of left shoulder  Fall, initial encounter     Discharge Instructions      Your x-ray did not show any broken bones today which is great news.  Follow-up with either an orthopedist or your primary care provider within the next week if not significantly improving.  Wear the sling when you are up and moving to help take pressure off of the  shoulder and as your pain reduces start doing very gentle range of motion exercises to keep it from freezing up.  Tylenol, tramadol as needed.    ED Prescriptions   None    PDMP not reviewed this encounter.   Particia Nearing, [place] 02/27/23 1455

## 2023-03-03 ENCOUNTER — Ambulatory Visit: Payer: Medicare Other | Admitting: Student

## 2023-03-04 DIAGNOSIS — M5451 Vertebrogenic low back pain: Secondary | ICD-10-CM | POA: Diagnosis not present

## 2023-05-28 DIAGNOSIS — Z23 Encounter for immunization: Secondary | ICD-10-CM | POA: Diagnosis not present

## 2023-07-14 ENCOUNTER — Ambulatory Visit: Payer: Medicare Other | Admitting: Nurse Practitioner

## 2023-07-16 ENCOUNTER — Other Ambulatory Visit: Payer: Self-pay | Admitting: Cardiovascular Disease

## 2023-07-17 ENCOUNTER — Ambulatory Visit: Payer: Medicare Other | Attending: Nurse Practitioner | Admitting: Nurse Practitioner

## 2023-07-17 ENCOUNTER — Encounter: Payer: Self-pay | Admitting: Nurse Practitioner

## 2023-07-17 VITALS — BP 120/64 | HR 70 | Ht 64.0 in | Wt 113.0 lb

## 2023-07-17 DIAGNOSIS — Z79899 Other long term (current) drug therapy: Secondary | ICD-10-CM | POA: Insufficient documentation

## 2023-07-17 DIAGNOSIS — E785 Hyperlipidemia, unspecified: Secondary | ICD-10-CM | POA: Diagnosis not present

## 2023-07-17 DIAGNOSIS — I5032 Chronic diastolic (congestive) heart failure: Secondary | ICD-10-CM | POA: Insufficient documentation

## 2023-07-17 DIAGNOSIS — I10 Essential (primary) hypertension: Secondary | ICD-10-CM | POA: Insufficient documentation

## 2023-07-17 DIAGNOSIS — I5181 Takotsubo syndrome: Secondary | ICD-10-CM | POA: Insufficient documentation

## 2023-07-17 DIAGNOSIS — I252 Old myocardial infarction: Secondary | ICD-10-CM | POA: Diagnosis not present

## 2023-07-17 DIAGNOSIS — Z8673 Personal history of transient ischemic attack (TIA), and cerebral infarction without residual deficits: Secondary | ICD-10-CM | POA: Insufficient documentation

## 2023-07-17 DIAGNOSIS — E7849 Other hyperlipidemia: Secondary | ICD-10-CM | POA: Diagnosis not present

## 2023-07-17 NOTE — Progress Notes (Signed)
Office Visit    Patient Name: Jeanne Rogers Date of Encounter: 07/17/2023 PCP:  Tommie Sams DO [facility]  Cardiologist:  Charlton Haws, MD  Advanced Practice Provider:  Sharlene Dory, NP Electrophysiologist:  None   Chief Complaint and HPI    Jeanne Rogers is a 82 y.o. female with a hx of Takotsubo cardiomyopathy, history of NSTEMI, hypertension, remote TIA, hyperlipidemia, history of spontaneous pneumothorax, history of mild dementia, who presents today for scheduled follow-up.   Previous cardiovascular history includes NSTEMI in 2021.  Cardiac catheterization revealed normal coronary arteries.  Echocardiogram at that time revealed EF 30 to 35%, was determined to be due to Takotsubo cardiomyopathy.  She has had limited GDMT due to low blood pressures.  Repeat echocardiogram in 2022 showed recovered EF at 65 to 70%.  Last seen by Dr. Eden Emms on March 11, 2021.  She was overall doing well from a cardiac perspective.  I last saw her on January 08, 2023 for preoperative cardiovascular risk assessment.  She was pending lumbar kyphoplasty with Dr. Shon Baton at [facility].  Was doing well at the time.    Today she presents for follow-up with her husband who also helps accessory historian.  She is doing well denies any acute cardiac complaints or issues.  Tolerated her procedure well. Denies any chest pain, shortness of breath, palpitations, syncope, presyncope, dizziness, orthopnea, PND, swelling or significant weight changes, acute bleeding, or claudication.  SH: Former Physicist, medical member until 2017, daughter is a Therapist, music in [place], 2 children are Counsellor, 1 child is the ER doctor at [employer]. Married for nearly 60 years. Enjoys playing cards with her husband in her free time.   EKGs/Labs/Other Studies Reviewed:   The following studies were reviewed today:   EKG:  EKG is not ordered today.   Echo 08/2020:   1. Left ventricular  ejection fraction, by estimation, is 65 to 70%. The  left ventricle has normal function. The left ventricle has no regional  wall motion abnormalities. There is mild left ventricular hypertrophy.  Left ventricular diastolic parameters  are consistent with Grade I diastolic dysfunction (impaired relaxation).   2. Right ventricular systolic function is normal. The right ventricular  size is normal. There is normal pulmonary artery systolic pressure.   3. The mitral valve is normal in structure. No evidence of mitral valve  regurgitation. No evidence of mitral stenosis.   4. The aortic valve is tricuspid. Aortic valve regurgitation is not  visualized. No aortic stenosis is present.   5. The inferior vena cava is normal in size with greater than 50%  respiratory variability, suggesting right atrial pressure of 3 mmHg.  LHC 05/2020:  Right dominant coronary anatomy Widely patent left main Widely patent LAD Widely patent circumflex Widely patent RCA Coronary tortuosity Apical ballooning/Takotsubo cardiomyopathy with LVEDP 6 mmHg.  EF 30 to 35%.   RECOMMENDATIONS:   Supportive therapy Wean and DC nitro as tolerated given low LVEDP. Convert anticoagulation to subcu.  Echo 05/2020:   1. Left ventricular ejection fraction, by estimation, is 30 to 35%. The  left ventricle has moderately decreased function. The left ventricle  demonstrates regional wall motion abnormalities with mid to apical  anteroseptal and inferoseptal akinesis.  Apical lateral, apical anterior, and apical inferior akinesis. Akinesis of  the true apex. This pattern suggests LAD territory infarction. No LV  thrombus visualized. There is mild left ventricular hypertrophy. Left  ventricular diastolic parameters are  consistent with Grade I diastolic dysfunction (impaired relaxation).   2. Right ventricular systolic function is normal. The right ventricular  size is normal. There is mildly elevated pulmonary artery  systolic  pressure. The estimated right ventricular systolic pressure is 36.9 mmHg.   3. The mitral valve is normal in structure. No evidence of mitral valve  regurgitation. No evidence of mitral stenosis.   4. The aortic valve is tricuspid. Aortic valve regurgitation is not  visualized. No aortic stenosis is present.   5. A small pericardial effusion is present, primarily adjacent to the RV  free wall.   6. The inferior vena cava is normal in size with <50% respiratory  variability, suggesting right atrial pressure of 8 mmHg.  Review of Systems    All other systems reviewed and are otherwise negative except as noted above.  Physical Exam    VS:  BP 120/64   Pulse 70   Ht 5\' 4"  (1.626 m)   Wt 113 lb (51.3 kg)   SpO2 98%   BMI 19.40 kg/m  , BMI Body mass index is 19.4 kg/m.  Wt Readings from Last 3 Encounters:  07/17/23 113 lb (51.3 kg)  01/08/23 123 lb 3.2 oz (55.9 kg)  12/01/22 125 lb (56.7 kg)     GEN: Thin, frail 82 y.o. female in no acute distress. HEENT: normal. Neck: Supple, no JVD, carotid bruits, or masses. Cardiac: S1/S2, RRR, no murmurs, rubs, or gallops. No clubbing, cyanosis, edema.  Radials/PT 2+ and equal bilaterally.  Respiratory:  Respirations regular and unlabored, clear to auscultation bilaterally. MS: No deformity or atrophy. Skin: Thin skin, warm and dry, no rash. Neuro:  Strength and sensation are intact. Psych: Normal affect.  Assessment & Plan   1. Hx of Takotsubo cardiomyopathy, HFrecEF Echo in 2022 showed recovered EF. Euvolemic and well compensated on exam. Continue current medication regimen. Low sodium diet, fluid restriction <2L, and daily weights encouraged. Educated to contact our office for weight gain of 2 lbs overnight or 5 lbs in one week. Heart healthy diet and regular cardiovascular exercise encouraged.   2. Hx of NSTEMI LHC in 2021 showed normal coronaries. Stable with no anginal symptoms. No indication for ischemic evaluation.  Continue current medication regimen. Heart healthy diet and regular cardiovascular exercise encouraged.   4. HLD, remote TIA, medication management Hx of statin intolerance. Husband reports prior hx of TIA many years ago. Last LDL not at goal.  Has not had lab work completed from previous office visit.  Would not be a good candidate for PCSK9i d/t dementia. Will obtain FLP and LFT and discuss Levquio in the future. Heart healthy diet and regular cardiovascular exercise encouraged.   5. HTN BP stable. Goal SBP < 140. No medication changes at this time. Discussed to monitor BP at home at least 2 hours after medications and sitting for 5-10 minutes. Heart healthy diet and regular cardiovascular exercise encouraged.   Disposition: Follow up in 6 month(s) with Charlton Haws, MD or APP.  Signed, Sharlene Dory, NP 07/17/2023, 11:59 AM [facility]

## 2023-07-17 NOTE — Patient Instructions (Signed)
Medication Instructions:  Your physician recommends that you continue on your current medications as directed. Please refer to the Current Medication list given to you today.  Labwork: Labs today   Testing/Procedures: None   Follow-Up: Your physician recommends that you schedule a follow-up appointment in: 6 months   Any Other Special Instructions Will Be Listed Below (If Applicable).  If you need a refill on your cardiac medications before your next appointment, please call your pharmacy.

## 2023-08-14 ENCOUNTER — Other Ambulatory Visit: Payer: Self-pay | Admitting: Nurse Practitioner

## 2023-08-19 ENCOUNTER — Other Ambulatory Visit: Payer: Self-pay | Admitting: Nurse Practitioner

## 2023-09-10 ENCOUNTER — Ambulatory Visit: Payer: Medicare Other | Admitting: Nurse Practitioner

## 2023-09-14 ENCOUNTER — Other Ambulatory Visit: Payer: Self-pay | Admitting: Family Medicine

## 2023-09-22 ENCOUNTER — Other Ambulatory Visit: Payer: Self-pay | Admitting: Family Medicine

## 2023-10-18 ENCOUNTER — Other Ambulatory Visit: Payer: Self-pay

## 2023-10-18 ENCOUNTER — Encounter (HOSPITAL_COMMUNITY): Payer: Self-pay | Admitting: *Deleted

## 2023-10-18 ENCOUNTER — Emergency Department (HOSPITAL_COMMUNITY)

## 2023-10-18 ENCOUNTER — Emergency Department (HOSPITAL_COMMUNITY)
Admission: EM | Admit: 2023-10-18 | Discharge: 2023-10-18 | Disposition: A | Discharge status: Home / Self Care | Attending: Emergency Medicine | Admitting: Emergency Medicine

## 2023-10-18 DIAGNOSIS — W19XXXD Unspecified fall, subsequent encounter: Secondary | ICD-10-CM | POA: Insufficient documentation

## 2023-10-18 DIAGNOSIS — M549 Dorsalgia, unspecified: Secondary | ICD-10-CM | POA: Diagnosis not present

## 2023-10-18 DIAGNOSIS — F0390 Unspecified dementia without behavioral disturbance: Secondary | ICD-10-CM | POA: Insufficient documentation

## 2023-10-18 DIAGNOSIS — S32020D Wedge compression fracture of second lumbar vertebra, subsequent encounter for fracture with routine healing: Secondary | ICD-10-CM | POA: Insufficient documentation

## 2023-10-18 DIAGNOSIS — R0689 Other abnormalities of breathing: Secondary | ICD-10-CM | POA: Diagnosis not present

## 2023-10-18 DIAGNOSIS — S32020G Wedge compression fracture of second lumbar vertebra, subsequent encounter for fracture with delayed healing: Secondary | ICD-10-CM

## 2023-10-18 DIAGNOSIS — W19XXXA Unspecified fall, initial encounter: Secondary | ICD-10-CM | POA: Diagnosis not present

## 2023-10-18 DIAGNOSIS — Z7982 Long term (current) use of aspirin: Secondary | ICD-10-CM | POA: Insufficient documentation

## 2023-10-18 DIAGNOSIS — Z043 Encounter for examination and observation following other accident: Secondary | ICD-10-CM | POA: Diagnosis not present

## 2023-10-18 DIAGNOSIS — M4855XA Collapsed vertebra, not elsewhere classified, thoracolumbar region, initial encounter for fracture: Secondary | ICD-10-CM | POA: Diagnosis not present

## 2023-10-18 DIAGNOSIS — I10 Essential (primary) hypertension: Secondary | ICD-10-CM | POA: Diagnosis not present

## 2023-10-18 MED ORDER — NITROFURANTOIN MONOHYD MACRO 100 MG PO CAPS: 100.0000 mg | ORAL_CAPSULE | Freq: Two times a day (BID) | ORAL | 0 refills | Status: DC

## 2023-10-18 MED ORDER — OXYCODONE-ACETAMINOPHEN 5-325 MG PO TABS
1.0000 | ORAL_TABLET | Freq: Once | ORAL | Status: AC
  Administered 2023-10-18: 1 via ORAL
  Filled 2023-10-18: qty 1

## 2023-10-18 MED ORDER — OXYCODONE-ACETAMINOPHEN 5-325 MG PO TABS: 1.0000 | ORAL_TABLET | Freq: Four times a day (QID) | ORAL | 0 refills | Status: DC | PRN

## 2023-10-18 MED ORDER — OXYCODONE-ACETAMINOPHEN 5-325 MG PO TABS: ORAL_TABLET | ORAL | 0 refills | Status: DC

## 2023-10-18 NOTE — ED Triage Notes (Signed)
 Pt BIB [facility] for a fall that occurred on Friday, pt not getting up as her usual since fall.  + back pain. Denies hitting her head but a bruise noted to left cheek,  reported no blood thinners.

## 2023-10-18 NOTE — Discharge Instructions (Signed)
 Follow-up with your family doctor later this week to check your back and your urine

## 2023-10-19 NOTE — ED Provider Notes (Signed)
 [place] [facility] AT [facility] Provider Note   CSN: [id number] Arrival date & time: 10/18/23  1137     History  Chief Complaint  Patient presents with   Jeanne Rogers is a 83 y.o. female.  Patient has a history of dementia and fell recently.  Patient having some back pain  The history is provided by the patient and a relative. No language interpreter was used.  Fall This is a new problem. The current episode started yesterday. The problem occurs rarely. The problem has been resolved. Pertinent negatives include no chest pain, no abdominal pain and no headaches. Nothing aggravates the symptoms. Nothing relieves the symptoms. She has tried nothing for the symptoms.       Home Medications Prior to Admission medications   Medication Sig Start Date End Date Taking? Authorizing Provider  nitrofurantoin, macrocrystal-monohydrate, (MACROBID) 100 MG capsule Take 1 capsule (100 mg total) by mouth 2 (two) times daily. X 7 days 10/18/23  Yes Bethann Berkshire, MD  aspirin EC 81 MG tablet Take 81 mg by mouth daily. Swallow whole.    [provider]  citalopram (CELEXA) 10 MG tablet TAKE 1 TABLET(10 MG) BY MOUTH DAILY 09/14/23   Adriana Simas, [place] G, DO  ezetimibe (ZETIA) 10 MG tablet TAKE 1 TABLET(10 MG) BY MOUTH DAILY 12/25/22   Everlene Other G, DO  isosorbide mononitrate (IMDUR) 30 MG 24 hr tablet TAKE 1 TABLET(30 MG) BY MOUTH DAILY 08/20/23   Sharlene Dory, NP  memantine (NAMENDA) 10 MG tablet TAKE 1 TABLET(10 MG) BY MOUTH TWICE DAILY 09/22/23   Everlene Other G, DO  metoprolol succinate (TOPROL-XL) 25 MG 24 hr tablet TAKE 1/2 TABLET BY MOUTH EVERY DAY 07/16/23   Wendall Stade, MD  oxyCODONE-acetaminophen (PERCOCET/ROXICET) 5-325 MG tablet Take 1 every 6 hours for pain not relieved by Tylenol or Motrin alone 10/18/23   Bethann Berkshire, MD      Allergies    Clopidogrel, Methergine [methylergonovine maleate], and Statins    Review of Systems   Review of  Systems  Constitutional:  Negative for appetite change and fatigue.  HENT:  Negative for congestion, ear discharge and sinus pressure.   Eyes:  Negative for discharge.  Respiratory:  Negative for cough.   Cardiovascular:  Negative for chest pain.  Gastrointestinal:  Negative for abdominal pain and diarrhea.  Genitourinary:  Negative for frequency and hematuria.  Musculoskeletal:  Positive for back pain.  Skin:  Negative for rash.  Neurological:  Negative for seizures and headaches.  Psychiatric/Behavioral:  Negative for hallucinations.     Physical Exam Updated Vital Signs BP (!) 160/80 (BP Location: Right Arm)   Pulse 80   Temp 98.2 F (36.8 C) (Oral)   Resp 18   Ht 5\' 4"  (1.626 m)   Wt 51.3 kg   SpO2 99%   BMI 19.41 kg/m  Physical Exam Vitals and nursing note reviewed.  Constitutional:      Appearance: She is well-developed.  HENT:     Head: Normocephalic.     Nose: Nose normal.  Eyes:     General: No scleral icterus.    Conjunctiva/sclera: Conjunctivae normal.  Neck:     Thyroid: No thyromegaly.  Cardiovascular:     Rate and Rhythm: Normal rate and regular rhythm.     Heart sounds: No murmur heard.    No friction rub. No gallop.  Pulmonary:     Breath sounds: No stridor. No wheezing or rales.  Chest:     Chest wall: No tenderness.  Abdominal:     General: There is no distension.     Tenderness: There is no abdominal tenderness. There is no rebound.  Musculoskeletal:        General: Normal range of motion.     Cervical back: Neck supple.     Comments: Tender lumbar spine  Lymphadenopathy:     Cervical: No cervical adenopathy.  Skin:    Findings: No erythema or rash.  Neurological:     Mental Status: She is alert.     Motor: No abnormal muscle tone.     Coordination: Coordination normal.     Comments: Patient went to the person only  Psychiatric:        Behavior: Behavior normal.     ED Results / Procedures / Treatments   Labs (all labs ordered  are listed, but only abnormal results are displayed) Labs Reviewed - No data to display  EKG None  Radiology DG Pelvis 1-2 Views Result Date: 10/18/2023 CLINICAL DATA:  Fall. EXAM: PELVIS - 1-2 VIEW COMPARISON:  None Available. FINDINGS: No fracture or bone lesion. Hip joints, SI joints and pubic symphysis are normally aligned. Skeletal structures are demineralized. Soft tissues are unremarkable. IMPRESSION: Negative. Electronically Signed   By: Amie Portland M.D.   On: 10/18/2023 12:34   DG Lumbar Spine Complete Result Date: 10/18/2023 CLINICAL DATA:  Fall.  Back pain. EXAM: LUMBAR SPINE - COMPLETE 4+ VIEW COMPARISON:  12/01/2022. FINDINGS: Mild compression fracture of L2, new when compared to the prior radiographs. Depression of the upper endplate of L5 consistent with a prominent Schmorl's node, unchanged. Moderate wedge-shaped compression fracture of T12 is chronic previously treated with vertebroplasty. Mild compression fracture of L1 is also chronic, treated with vertebroplasty since the prior lumbar spine radiographs. No other fractures.  No bone lesions.  No spondylolisthesis. Mild loss of disc height at L2-L3. IMPRESSION: 1. Mild compression fracture of L2, new when compared to the prior radiographs. 2. 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 Electronically Signed   By: Amie Portland M.D.   On: 10/18/2023 12:33    Procedures Procedures    Medications Ordered in ED Medications  oxyCODONE-acetaminophen (PERCOCET/ROXICET) 5-325 MG per tablet 1 tablet (1 tablet Oral Given 10/18/23 1302)    ED Course/ Medical Decision Making/ A&P                                 Medical Decision Making Amount and/or Complexity of Data Reviewed Radiology: ordered.  Risk Prescription drug management.   Patient with mild compression fracture.  She is discharged home with pain medicine and will follow-up with her PCP.        Final Clinical Impression(s) / ED  Diagnoses Final diagnoses:  Fall, initial encounter  Closed compression fracture of L2 lumbar vertebra with delayed healing, subsequent encounter    Rx / DC Orders ED Discharge Orders          Ordered    nitrofurantoin, macrocrystal-monohydrate, (MACROBID) 100 MG capsule  2 times daily        10/18/23 1507    oxyCODONE-acetaminophen (PERCOCET/ROXICET) 5-325 MG tablet  Every 6 hours PRN,   Status:  Discontinued        10/18/23 1507    oxyCODONE-acetaminophen (PERCOCET/ROXICET) 5-325 MG tablet        10/18/23 1508  Bethann Berkshire, MD 10/19/23 1002

## 2023-10-20 ENCOUNTER — Telehealth: Payer: Self-pay

## 2023-10-20 NOTE — Transitions of Care (Post Inpatient/ED Visit) (Signed)
   10/20/2023  Name: Jeanne Rogers MRN: [id number] DOB: [birth year]  Today's TOC FU Call Status: Today's TOC FU Call Status:: Successful TOC FU Call Completed TOC FU Call Complete Date: 10/20/23 Patient's Name and Date of Birth confirmed.  Transition Care Management Follow-up Telephone Call Date of Discharge: 10/18/23 [facility] Type of Discharge: Emergency Department Reason for ED Visit: Orthopedic Conditions Orthopedic/Injury Diagnosis: Fracture How have you been since you were released from the hospital?: Better Any questions or concerns?: No  Items Reviewed: Did you receive and understand the discharge instructions provided?: Yes Medications obtained,verified, and reconciled?: Yes (Medications Reviewed) Any new allergies since your discharge?: No Dietary orders reviewed?: NA Do you have support at home?: Yes People in Home: spouse  Medications Reviewed Today: Medications Reviewed Today     Reviewed by Anthoney Harada, LPN (Licensed Practical Nurse) on 10/20/23 at 1211  Med List Status: <None>   Medication Order Taking? Sig Documenting Provider Last Dose Status Informant  aspirin EC 81 MG tablet [id number] Yes Take 81 mg by mouth daily. Swallow whole. [provider] Taking Active   citalopram (CELEXA) 10 MG tablet [id number] Yes TAKE 1 TABLET(10 MG) BY MOUTH DAILY Tommie Sams, DO Taking Active   ezetimibe (ZETIA) 10 MG tablet [id number] Yes TAKE 1 TABLET(10 MG) BY MOUTH DAILY Tommie Sams, DO Taking Active   isosorbide mononitrate (IMDUR) 30 MG 24 hr tablet [id number] Yes TAKE 1 TABLET(30 MG) BY MOUTH DAILY Sharlene Dory, NP Taking Active   memantine (NAMENDA) 10 MG tablet [id number] Yes TAKE 1 TABLET(10 MG) BY MOUTH TWICE DAILY Tommie Sams, DO Taking Active   metoprolol succinate (TOPROL-XL) 25 MG 24 hr tablet [id number] Yes TAKE 1/2 TABLET BY MOUTH EVERY DAY Wendall Stade, MD Taking Active   nitrofurantoin, macrocrystal-monohydrate,  (MACROBID) 100 MG capsule [id number] Yes Take 1 capsule (100 mg total) by mouth 2 (two) times daily. X 7 days Bethann Berkshire, MD Taking Active   oxyCODONE-acetaminophen (PERCOCET/ROXICET) 5-325 MG tablet [id number] Yes Take 1 every 6 hours for pain not relieved by Tylenol or Motrin alone Bethann Berkshire, MD Taking Active             Home Care and Equipment/Supplies: Were Home Health Services Ordered?: No Any new equipment or medical supplies ordered?: No  Functional Questionnaire: Do you need assistance with bathing/showering or dressing?: Yes Do you need assistance with meal preparation?: Yes Do you need assistance with eating?: No Do you have difficulty maintaining continence: No Do you need assistance with getting out of bed/getting out of a chair/moving?: No Do you have difficulty managing or taking your medications?: Yes  Follow up appointments reviewed: PCP Follow-up appointment confirmed?: Yes Date of PCP follow-up appointment?: 10/21/23 Follow-up Provider: Dr. Everlene Other Specialist [facility] Follow-up appointment confirmed?: NA Do you need transportation to your follow-up appointment?: No Do you understand care options if your condition(s) worsen?: Yes-patient verbalized understanding    SIGNATURE Kandis Fantasia, LPN [facility] Health Advisor [place] [facility] You Are. We Are. [facility] Direct Dial [phone]

## 2023-10-21 ENCOUNTER — Ambulatory Visit (INDEPENDENT_AMBULATORY_CARE_PROVIDER_SITE_OTHER): Admitting: Family Medicine

## 2023-10-21 ENCOUNTER — Other Ambulatory Visit: Payer: Self-pay | Admitting: Family Medicine

## 2023-10-21 VITALS — BP 108/58 | HR 92 | Temp 97.6°F | Wt 110.0 lb

## 2023-10-21 DIAGNOSIS — R638 Other symptoms and signs concerning food and fluid intake: Secondary | ICD-10-CM | POA: Diagnosis not present

## 2023-10-21 DIAGNOSIS — S32020D Wedge compression fracture of second lumbar vertebra, subsequent encounter for fracture with routine healing: Secondary | ICD-10-CM | POA: Diagnosis not present

## 2023-10-21 DIAGNOSIS — G3184 Mild cognitive impairment, so stated: Secondary | ICD-10-CM

## 2023-10-21 DIAGNOSIS — E785 Hyperlipidemia, unspecified: Secondary | ICD-10-CM

## 2023-10-21 DIAGNOSIS — F0390 Unspecified dementia without behavioral disturbance: Secondary | ICD-10-CM | POA: Diagnosis not present

## 2023-10-21 NOTE — Patient Instructions (Addendum)
 Labs today.  Home health order placed.  I will fax order for Wheelchair.  Follow up in ~ 3 weeks.

## 2023-10-22 DIAGNOSIS — S32020A Wedge compression fracture of second lumbar vertebra, initial encounter for closed fracture: Secondary | ICD-10-CM | POA: Insufficient documentation

## 2023-10-22 DIAGNOSIS — R638 Other symptoms and signs concerning food and fluid intake: Secondary | ICD-10-CM | POA: Insufficient documentation

## 2023-10-22 LAB — CBC
Hematocrit: 40.7 % (ref 34.0–46.6)
Hemoglobin: 14 g/dL (ref 11.1–15.9)
MCH: 32.8 pg (ref 26.6–33.0)
MCHC: 34.4 g/dL (ref 31.5–35.7)
MCV: 95 fL (ref 79–97)
Platelets: 290 10*3/uL (ref 150–450)
RBC: 4.27 x10E6/uL (ref 3.77–5.28)
RDW: 14.4 % (ref 11.7–15.4)
WBC: 8.7 10*3/uL (ref 3.4–10.8)

## 2023-10-22 LAB — CMP14+EGFR
ALT: 18 IU/L (ref 0–32)
AST: 19 IU/L (ref 0–40)
Albumin: 3.8 g/dL (ref 3.7–4.7)
Alkaline Phosphatase: 74 IU/L (ref 44–121)
BUN/Creatinine Ratio: 26 (ref 12–28)
BUN: 23 mg/dL (ref 8–27)
Bilirubin Total: 0.4 mg/dL (ref 0.0–1.2)
CO2: 24 mmol/L (ref 20–29)
Calcium: 9.1 mg/dL (ref 8.7–10.3)
Chloride: 102 mmol/L (ref 96–106)
Creatinine, Ser: 0.87 mg/dL (ref 0.57–1.00)
Globulin, Total: 2.4 g/dL (ref 1.5–4.5)
Glucose: 120 mg/dL — ABNORMAL HIGH (ref 70–99)
Potassium: 3.6 mmol/L (ref 3.5–5.2)
Sodium: 143 mmol/L (ref 134–144)
Total Protein: 6.2 g/dL (ref 6.0–8.5)
eGFR: 67 mL/min/{1.73_m2} (ref >59)

## 2023-10-22 NOTE — Assessment & Plan Note (Signed)
 Patient's cognitive impairment worsening.  Arranging home health services.

## 2023-10-22 NOTE — Assessment & Plan Note (Signed)
 Advised to encourage intake.  Labs today to assess for malnutrition and dehydration.

## 2023-10-22 NOTE — Progress Notes (Signed)
 Subjective:  Patient ID: Jeanne Rogers, female    DOB: 06-07-1941  Age: 83 y.o. MRN: [id number]  CC:   Chief Complaint  Patient presents with   Fall    Friday , UTI, compression fx L2 Rx for wheelchair, home health and pt    HPI:  83 year old female presents for evaluation of the above.  Daughter reports that she has not been doing well lately.  Had foul-smelling urine and subsequently had a fall at home.  Has had difficulty with ambulation.  Has also had decreased p.o. intake.  Ended up in the ER on 3/16.  X-rays revealed compression fracture of L2.  Daughter states that no lab work was done no IV fluids were given despite her decreased p.o. intake.  Additionally, no urine was able to be obtained.  She was placed on empiric treatment for UTI with Macrobid.  She was treated with medication regarding her fracture and discharged home.  Daughter states that she is declining.  She is not taking much by mouth.  She is not currently ambulating and is mainly in the bed.  She is in a wheelchair here today that was from another family member.  Daughter would like for her to have a manual wheelchair.  She needs this to be able to move about safely as she is not currently ambulatory.  Additionally, daughter is requesting home health and also incontinence supplies.  Patient is currently incontinent due to advancing dementia.  Patient Active Problem List   Diagnosis Date Noted   Decreased oral intake 10/22/2023   Compression fracture of L2 lumbar vertebra (HCC) 10/22/2023   Iron deficiency anemia 08/07/2022   Actinic keratosis 08/07/2022   Stage 3a chronic kidney disease (HCC) 03/27/2022   History of non-ST elevation myocardial infarction (NSTEMI) 03/26/2022   Takotsubo cardiomyopathy    Dementia (HCC) 10/11/2019   Hyperlipidemia 08/26/2019   Osteoporosis 06/29/2013    Social Hx   Social History   Socioeconomic History   Marital status: Married    Spouse name: Not on file   Number of  children: Not on file   Years of education: Not on file   Highest education level: Master's degree (e.g., MA, MS, MEng, MEd, MSW, MBA)  Occupational History   Not on file  Tobacco Use   Smoking status: Former    Current packs/day: 0.00    Types: Cigarettes    Quit date: 10/02/1988    Years since quitting: 35.0   Smokeless tobacco: Never  Vaping Use   Vaping status: Never Used  Substance and Sexual Activity   Alcohol use: No   Drug use: No   Sexual activity: Not Currently  Other Topics Concern   Not on file  Social History Narrative   Not on file   Social Drivers of Health   Financial Resource Strain: Low Risk  (10/20/2023)   Overall Financial Resource Strain (CARDIA)    Difficulty of Paying Living Expenses: Not hard at all  Food Insecurity: No Food Insecurity (10/20/2023)   Hunger Vital Sign    Worried About Running Out of Food in the Last Year: Never true    Ran Out of Food in the Last Year: Never true  Transportation Needs: No Transportation Needs (10/20/2023)   PRAPARE - Administrator, Civil Service (Medical): No    Lack of Transportation (Non-Medical): No  Physical Activity: Unknown (10/20/2023)   Exercise Vital Sign    Days of Exercise per Week: 0 days  Minutes of Exercise per Session: Not on file  Stress: No Stress Concern Present (10/20/2023)   [employer] of Occupational Health - Occupational Stress Questionnaire    Feeling of Stress : Only a little  Social Connections: Socially Integrated (10/20/2023)   Social Connection and Isolation Panel [NHANES]    Frequency of Communication with Friends and Family: Twice a week    Frequency of Social Gatherings with Friends and Family: Three times a week    Attends Religious Services: 1 to 4 times per year    Active Member of Clubs or Organizations: Yes    Attends Banker Meetings: 1 to 4 times per year    Marital Status: Married    Review of Systems Per HPI  Objective:  BP (!) 108/58    Pulse 92   Temp 97.6 F (36.4 C)   Wt 110 lb (49.9 kg)   SpO2 94%   BMI 18.88 kg/m      10/21/2023    4:11 PM 10/18/2023    4:06 PM 10/18/2023    2:15 PM  BP/Weight  Systolic BP 108 160 177  Diastolic BP 58 80 113  Wt. (Lbs) 110    BMI 18.88 kg/m2      Physical Exam Vitals and nursing note reviewed.  Constitutional:      General: She is not in acute distress.    Appearance: Normal appearance.  HENT:     Head:     Comments: Bruise noted to the left jaw.    Nose: Nose normal.  Eyes:     General:        Right eye: No discharge.        Left eye: No discharge.     Conjunctiva/sclera: Conjunctivae normal.  Cardiovascular:     Rate and Rhythm: Normal rate and regular rhythm.  Pulmonary:     Effort: Pulmonary effort is normal.     Breath sounds: Normal breath sounds. No wheezing or rales.  Neurological:     Mental Status: She is alert.     Comments: Patient is not currently speaking/answering questions.     Lab Results  Component Value Date   WBC WILL FOLLOW 10/21/2023   HGB WILL FOLLOW 10/21/2023   HCT WILL FOLLOW 10/21/2023   PLT WILL FOLLOW 10/21/2023   GLUCOSE 120 (H) 10/21/2023   CHOL 242 (H) 09/25/2021   TRIG 131 09/25/2021   HDL 76 09/25/2021   LDLCALC 143 (H) 09/25/2021   ALT 18 10/21/2023   AST 19 10/21/2023   NA 143 10/21/2023   K 3.6 10/21/2023   CL 102 10/21/2023   CREATININE 0.87 10/21/2023   BUN 23 10/21/2023   CO2 24 10/21/2023   TSH 2.521 08/25/2019   INR 1.0 08/24/2019   HGBA1C 5.4 05/10/2020     Assessment & Plan:  Compression fracture of L2 vertebra with routine healing, subsequent encounter Assessment & Plan: Patient not currently ambulatory.  Arranging home health PT.  Will send in Rx for wheelchair. Pain is well-controlled.  Orders: -     Ambulatory referral to Home Health  Decreased oral intake Assessment & Plan: Advised to encourage intake.  Labs today to assess for malnutrition and dehydration.  Orders: -     CBC -      CMP14+EGFR  Dementia, unspecified dementia severity, unspecified dementia type, unspecified whether behavioral, psychotic, or mood disturbance or anxiety (HCC) Assessment & Plan: Patient's cognitive impairment worsening.  Arranging home health services.    Orders: -  Ambulatory referral to Home Health    Follow-up:  3 weeks.  30 minutes were spent during this visit -taking history and doing physical exam and discussing plans recommendations with family; reviewing records from the ER; arranging home health services; ordering labs.    Everlene Other DO [facility]

## 2023-10-22 NOTE — Assessment & Plan Note (Addendum)
 Patient not currently ambulatory.  Arranging home health PT.  Will send in Rx for wheelchair. Pain is well-controlled.

## 2023-10-25 ENCOUNTER — Encounter: Payer: Self-pay | Admitting: Family Medicine

## 2023-10-26 DIAGNOSIS — D509 Iron deficiency anemia, unspecified: Secondary | ICD-10-CM | POA: Diagnosis not present

## 2023-10-26 DIAGNOSIS — M8008XD Age-related osteoporosis with current pathological fracture, vertebra(e), subsequent encounter for fracture with routine healing: Secondary | ICD-10-CM | POA: Diagnosis not present

## 2023-10-26 DIAGNOSIS — Z556 Problems related to health literacy: Secondary | ICD-10-CM | POA: Diagnosis not present

## 2023-10-26 DIAGNOSIS — Z7982 Long term (current) use of aspirin: Secondary | ICD-10-CM | POA: Diagnosis not present

## 2023-10-26 DIAGNOSIS — Z87891 Personal history of nicotine dependence: Secondary | ICD-10-CM | POA: Diagnosis not present

## 2023-10-26 DIAGNOSIS — D631 Anemia in chronic kidney disease: Secondary | ICD-10-CM | POA: Diagnosis not present

## 2023-10-26 DIAGNOSIS — I429 Cardiomyopathy, unspecified: Secondary | ICD-10-CM | POA: Diagnosis not present

## 2023-10-26 DIAGNOSIS — N390 Urinary tract infection, site not specified: Secondary | ICD-10-CM | POA: Diagnosis not present

## 2023-10-26 DIAGNOSIS — I252 Old myocardial infarction: Secondary | ICD-10-CM | POA: Diagnosis not present

## 2023-10-26 DIAGNOSIS — F0390 Unspecified dementia without behavioral disturbance: Secondary | ICD-10-CM | POA: Diagnosis not present

## 2023-10-26 DIAGNOSIS — N1831 Chronic kidney disease, stage 3a: Secondary | ICD-10-CM | POA: Diagnosis not present

## 2023-10-26 DIAGNOSIS — Z9181 History of falling: Secondary | ICD-10-CM | POA: Diagnosis not present

## 2023-10-28 ENCOUNTER — Telehealth: Payer: Self-pay

## 2023-10-28 DIAGNOSIS — N1831 Chronic kidney disease, stage 3a: Secondary | ICD-10-CM | POA: Diagnosis not present

## 2023-10-28 DIAGNOSIS — N390 Urinary tract infection, site not specified: Secondary | ICD-10-CM | POA: Diagnosis not present

## 2023-10-28 DIAGNOSIS — I429 Cardiomyopathy, unspecified: Secondary | ICD-10-CM | POA: Diagnosis not present

## 2023-10-28 DIAGNOSIS — F0390 Unspecified dementia without behavioral disturbance: Secondary | ICD-10-CM | POA: Diagnosis not present

## 2023-10-28 DIAGNOSIS — I252 Old myocardial infarction: Secondary | ICD-10-CM | POA: Diagnosis not present

## 2023-10-28 DIAGNOSIS — M8008XD Age-related osteoporosis with current pathological fracture, vertebra(e), subsequent encounter for fracture with routine healing: Secondary | ICD-10-CM | POA: Diagnosis not present

## 2023-10-28 NOTE — Telephone Encounter (Signed)
 Verbal orders given per Dr Adriana Simas to [place] [facility]

## 2023-10-28 NOTE — Telephone Encounter (Signed)
 Communication  Reason for CRM: In and out catheter request from [facility] from [facility], wants to know if PCP will approve        Best contact: [phone]

## 2023-10-29 ENCOUNTER — Encounter: Payer: Self-pay | Admitting: Family Medicine

## 2023-10-29 ENCOUNTER — Telehealth: Payer: Self-pay

## 2023-10-29 DIAGNOSIS — I429 Cardiomyopathy, unspecified: Secondary | ICD-10-CM | POA: Diagnosis not present

## 2023-10-29 DIAGNOSIS — I252 Old myocardial infarction: Secondary | ICD-10-CM | POA: Diagnosis not present

## 2023-10-29 DIAGNOSIS — N390 Urinary tract infection, site not specified: Secondary | ICD-10-CM | POA: Diagnosis not present

## 2023-10-29 DIAGNOSIS — M8008XD Age-related osteoporosis with current pathological fracture, vertebra(e), subsequent encounter for fracture with routine healing: Secondary | ICD-10-CM | POA: Diagnosis not present

## 2023-10-29 DIAGNOSIS — N1831 Chronic kidney disease, stage 3a: Secondary | ICD-10-CM | POA: Diagnosis not present

## 2023-10-29 DIAGNOSIS — F0390 Unspecified dementia without behavioral disturbance: Secondary | ICD-10-CM | POA: Diagnosis not present

## 2023-10-29 NOTE — Telephone Encounter (Signed)
 A prescription for a manual wheelchair is being re-requested to be faxed to [facility], please advise

## 2023-10-30 DIAGNOSIS — F0390 Unspecified dementia without behavioral disturbance: Secondary | ICD-10-CM | POA: Diagnosis not present

## 2023-10-30 DIAGNOSIS — N390 Urinary tract infection, site not specified: Secondary | ICD-10-CM | POA: Diagnosis not present

## 2023-10-30 DIAGNOSIS — I429 Cardiomyopathy, unspecified: Secondary | ICD-10-CM | POA: Diagnosis not present

## 2023-10-30 DIAGNOSIS — I252 Old myocardial infarction: Secondary | ICD-10-CM | POA: Diagnosis not present

## 2023-10-30 DIAGNOSIS — N1831 Chronic kidney disease, stage 3a: Secondary | ICD-10-CM | POA: Diagnosis not present

## 2023-10-30 DIAGNOSIS — M8008XD Age-related osteoporosis with current pathological fracture, vertebra(e), subsequent encounter for fracture with routine healing: Secondary | ICD-10-CM | POA: Diagnosis not present

## 2023-10-30 NOTE — Telephone Encounter (Signed)
 This order has been faxed to [facility]

## 2023-11-02 DIAGNOSIS — F0390 Unspecified dementia without behavioral disturbance: Secondary | ICD-10-CM | POA: Diagnosis not present

## 2023-11-02 DIAGNOSIS — N390 Urinary tract infection, site not specified: Secondary | ICD-10-CM | POA: Diagnosis not present

## 2023-11-02 DIAGNOSIS — N1831 Chronic kidney disease, stage 3a: Secondary | ICD-10-CM | POA: Diagnosis not present

## 2023-11-02 DIAGNOSIS — M8008XD Age-related osteoporosis with current pathological fracture, vertebra(e), subsequent encounter for fracture with routine healing: Secondary | ICD-10-CM | POA: Diagnosis not present

## 2023-11-02 DIAGNOSIS — I252 Old myocardial infarction: Secondary | ICD-10-CM | POA: Diagnosis not present

## 2023-11-02 DIAGNOSIS — I429 Cardiomyopathy, unspecified: Secondary | ICD-10-CM | POA: Diagnosis not present

## 2023-11-03 DIAGNOSIS — F0390 Unspecified dementia without behavioral disturbance: Secondary | ICD-10-CM | POA: Diagnosis not present

## 2023-11-03 DIAGNOSIS — M8008XD Age-related osteoporosis with current pathological fracture, vertebra(e), subsequent encounter for fracture with routine healing: Secondary | ICD-10-CM | POA: Diagnosis not present

## 2023-11-03 DIAGNOSIS — I252 Old myocardial infarction: Secondary | ICD-10-CM | POA: Diagnosis not present

## 2023-11-03 DIAGNOSIS — N1831 Chronic kidney disease, stage 3a: Secondary | ICD-10-CM | POA: Diagnosis not present

## 2023-11-03 DIAGNOSIS — I429 Cardiomyopathy, unspecified: Secondary | ICD-10-CM | POA: Diagnosis not present

## 2023-11-03 DIAGNOSIS — N390 Urinary tract infection, site not specified: Secondary | ICD-10-CM | POA: Diagnosis not present

## 2023-11-05 DIAGNOSIS — N390 Urinary tract infection, site not specified: Secondary | ICD-10-CM | POA: Diagnosis not present

## 2023-11-05 DIAGNOSIS — F0390 Unspecified dementia without behavioral disturbance: Secondary | ICD-10-CM | POA: Diagnosis not present

## 2023-11-05 DIAGNOSIS — I429 Cardiomyopathy, unspecified: Secondary | ICD-10-CM | POA: Diagnosis not present

## 2023-11-05 DIAGNOSIS — N1831 Chronic kidney disease, stage 3a: Secondary | ICD-10-CM | POA: Diagnosis not present

## 2023-11-05 DIAGNOSIS — I252 Old myocardial infarction: Secondary | ICD-10-CM | POA: Diagnosis not present

## 2023-11-05 DIAGNOSIS — M8008XD Age-related osteoporosis with current pathological fracture, vertebra(e), subsequent encounter for fracture with routine healing: Secondary | ICD-10-CM | POA: Diagnosis not present

## 2023-11-06 ENCOUNTER — Other Ambulatory Visit: Payer: Self-pay | Admitting: Family Medicine

## 2023-11-06 ENCOUNTER — Ambulatory Visit: Payer: Self-pay

## 2023-11-06 MED ORDER — CEPHALEXIN 500 MG PO CAPS: ORAL_CAPSULE | ORAL | 0 refills | Status: DC

## 2023-11-06 NOTE — Telephone Encounter (Signed)
 Info only: No triage- Husband Will calling to hear results on urinalysis. Pt gave urine sample to  a 3rd party home health group and was sent to [facility]. Lab told pt "we sent to PCP." RN explained to Will that he needed to call whoever ordered this and that we can't see results since [place] didn't order test. Pt verbalized understanding.            Copied from CRM [id number]. Topic: Clinical - Lab/Test Results >> Nov 06, 2023  1:53 PM Clayton Bibles wrote: Reason for CRM:  Please call spouse with the results from her urine test that was complete on 10/29/23 at [facility] stated that they faxed the results to the office. He wants to know if additional medication is needed.  Please call him at [phone]. Thanks Reason for Disposition  Health Information question, no triage required and triager able to answer question  Answer Assessment - Initial Assessment Questions 1. REASON FOR CALL or QUESTION: "What is your reason for calling today?" or "How can I best help you?" or "What question do you have that I can help answer?"     Urinalysis results  Protocols used: Information Only Call - No Triage-A-AH

## 2023-11-06 NOTE — Progress Notes (Signed)
 Cephalexin 500 mg 1 3 times a day for 5 days It appears that she is having some UTI symptoms with burning with urination Dipstick showed trace ketones with some leukocytes and bacteria Culture showed E. coli Warning signs regarding infection was reviewed with the home health nurse they will review this with the family If any problems or issues to follow-up here or worrisome issues to follow-up in the ER Home health nurse voiced understanding and stated she would inform the patient

## 2023-11-09 DIAGNOSIS — M8008XD Age-related osteoporosis with current pathological fracture, vertebra(e), subsequent encounter for fracture with routine healing: Secondary | ICD-10-CM | POA: Diagnosis not present

## 2023-11-09 DIAGNOSIS — I429 Cardiomyopathy, unspecified: Secondary | ICD-10-CM | POA: Diagnosis not present

## 2023-11-09 DIAGNOSIS — N1831 Chronic kidney disease, stage 3a: Secondary | ICD-10-CM | POA: Diagnosis not present

## 2023-11-09 DIAGNOSIS — F0390 Unspecified dementia without behavioral disturbance: Secondary | ICD-10-CM | POA: Diagnosis not present

## 2023-11-09 DIAGNOSIS — N390 Urinary tract infection, site not specified: Secondary | ICD-10-CM | POA: Diagnosis not present

## 2023-11-09 DIAGNOSIS — I252 Old myocardial infarction: Secondary | ICD-10-CM | POA: Diagnosis not present

## 2023-11-10 DIAGNOSIS — N1831 Chronic kidney disease, stage 3a: Secondary | ICD-10-CM | POA: Diagnosis not present

## 2023-11-10 DIAGNOSIS — M8008XD Age-related osteoporosis with current pathological fracture, vertebra(e), subsequent encounter for fracture with routine healing: Secondary | ICD-10-CM | POA: Diagnosis not present

## 2023-11-10 DIAGNOSIS — N390 Urinary tract infection, site not specified: Secondary | ICD-10-CM | POA: Diagnosis not present

## 2023-11-10 DIAGNOSIS — F0390 Unspecified dementia without behavioral disturbance: Secondary | ICD-10-CM | POA: Diagnosis not present

## 2023-11-10 DIAGNOSIS — I429 Cardiomyopathy, unspecified: Secondary | ICD-10-CM | POA: Diagnosis not present

## 2023-11-10 DIAGNOSIS — I252 Old myocardial infarction: Secondary | ICD-10-CM | POA: Diagnosis not present

## 2023-11-10 NOTE — Telephone Encounter (Signed)
 Duplicate- message was handled by Dr Lorin Picket on 11/06/23

## 2023-11-12 DIAGNOSIS — N390 Urinary tract infection, site not specified: Secondary | ICD-10-CM | POA: Diagnosis not present

## 2023-11-12 DIAGNOSIS — I252 Old myocardial infarction: Secondary | ICD-10-CM | POA: Diagnosis not present

## 2023-11-12 DIAGNOSIS — I429 Cardiomyopathy, unspecified: Secondary | ICD-10-CM | POA: Diagnosis not present

## 2023-11-12 DIAGNOSIS — M8008XD Age-related osteoporosis with current pathological fracture, vertebra(e), subsequent encounter for fracture with routine healing: Secondary | ICD-10-CM | POA: Diagnosis not present

## 2023-11-12 DIAGNOSIS — F0390 Unspecified dementia without behavioral disturbance: Secondary | ICD-10-CM | POA: Diagnosis not present

## 2023-11-12 DIAGNOSIS — N1831 Chronic kidney disease, stage 3a: Secondary | ICD-10-CM | POA: Diagnosis not present

## 2023-11-17 DIAGNOSIS — I252 Old myocardial infarction: Secondary | ICD-10-CM | POA: Diagnosis not present

## 2023-11-17 DIAGNOSIS — I429 Cardiomyopathy, unspecified: Secondary | ICD-10-CM | POA: Diagnosis not present

## 2023-11-17 DIAGNOSIS — N1831 Chronic kidney disease, stage 3a: Secondary | ICD-10-CM | POA: Diagnosis not present

## 2023-11-17 DIAGNOSIS — M8008XD Age-related osteoporosis with current pathological fracture, vertebra(e), subsequent encounter for fracture with routine healing: Secondary | ICD-10-CM | POA: Diagnosis not present

## 2023-11-17 DIAGNOSIS — F0390 Unspecified dementia without behavioral disturbance: Secondary | ICD-10-CM | POA: Diagnosis not present

## 2023-11-17 DIAGNOSIS — N390 Urinary tract infection, site not specified: Secondary | ICD-10-CM | POA: Diagnosis not present

## 2023-11-18 DIAGNOSIS — N1831 Chronic kidney disease, stage 3a: Secondary | ICD-10-CM | POA: Diagnosis not present

## 2023-11-18 DIAGNOSIS — F0390 Unspecified dementia without behavioral disturbance: Secondary | ICD-10-CM | POA: Diagnosis not present

## 2023-11-18 DIAGNOSIS — I429 Cardiomyopathy, unspecified: Secondary | ICD-10-CM | POA: Diagnosis not present

## 2023-11-18 DIAGNOSIS — N390 Urinary tract infection, site not specified: Secondary | ICD-10-CM | POA: Diagnosis not present

## 2023-11-18 DIAGNOSIS — M8008XD Age-related osteoporosis with current pathological fracture, vertebra(e), subsequent encounter for fracture with routine healing: Secondary | ICD-10-CM | POA: Diagnosis not present

## 2023-11-18 DIAGNOSIS — I252 Old myocardial infarction: Secondary | ICD-10-CM | POA: Diagnosis not present

## 2023-11-23 DIAGNOSIS — N1831 Chronic kidney disease, stage 3a: Secondary | ICD-10-CM | POA: Diagnosis not present

## 2023-11-23 DIAGNOSIS — F0390 Unspecified dementia without behavioral disturbance: Secondary | ICD-10-CM | POA: Diagnosis not present

## 2023-11-23 DIAGNOSIS — M8008XD Age-related osteoporosis with current pathological fracture, vertebra(e), subsequent encounter for fracture with routine healing: Secondary | ICD-10-CM | POA: Diagnosis not present

## 2023-11-23 DIAGNOSIS — I252 Old myocardial infarction: Secondary | ICD-10-CM | POA: Diagnosis not present

## 2023-11-23 DIAGNOSIS — N390 Urinary tract infection, site not specified: Secondary | ICD-10-CM | POA: Diagnosis not present

## 2023-11-23 DIAGNOSIS — I429 Cardiomyopathy, unspecified: Secondary | ICD-10-CM | POA: Diagnosis not present

## 2023-11-24 DIAGNOSIS — M8008XD Age-related osteoporosis with current pathological fracture, vertebra(e), subsequent encounter for fracture with routine healing: Secondary | ICD-10-CM | POA: Diagnosis not present

## 2023-11-24 DIAGNOSIS — I429 Cardiomyopathy, unspecified: Secondary | ICD-10-CM | POA: Diagnosis not present

## 2023-11-24 DIAGNOSIS — N390 Urinary tract infection, site not specified: Secondary | ICD-10-CM | POA: Diagnosis not present

## 2023-11-24 DIAGNOSIS — N1831 Chronic kidney disease, stage 3a: Secondary | ICD-10-CM | POA: Diagnosis not present

## 2023-11-24 DIAGNOSIS — I252 Old myocardial infarction: Secondary | ICD-10-CM | POA: Diagnosis not present

## 2023-11-24 DIAGNOSIS — F0390 Unspecified dementia without behavioral disturbance: Secondary | ICD-10-CM | POA: Diagnosis not present

## 2023-11-25 DIAGNOSIS — I429 Cardiomyopathy, unspecified: Secondary | ICD-10-CM | POA: Diagnosis not present

## 2023-11-25 DIAGNOSIS — D631 Anemia in chronic kidney disease: Secondary | ICD-10-CM | POA: Diagnosis not present

## 2023-11-25 DIAGNOSIS — I252 Old myocardial infarction: Secondary | ICD-10-CM | POA: Diagnosis not present

## 2023-11-25 DIAGNOSIS — N1831 Chronic kidney disease, stage 3a: Secondary | ICD-10-CM | POA: Diagnosis not present

## 2023-11-25 DIAGNOSIS — D509 Iron deficiency anemia, unspecified: Secondary | ICD-10-CM | POA: Diagnosis not present

## 2023-11-25 DIAGNOSIS — Z9181 History of falling: Secondary | ICD-10-CM | POA: Diagnosis not present

## 2023-11-25 DIAGNOSIS — M8008XD Age-related osteoporosis with current pathological fracture, vertebra(e), subsequent encounter for fracture with routine healing: Secondary | ICD-10-CM | POA: Diagnosis not present

## 2023-11-25 DIAGNOSIS — N390 Urinary tract infection, site not specified: Secondary | ICD-10-CM | POA: Diagnosis not present

## 2023-11-25 DIAGNOSIS — Z87891 Personal history of nicotine dependence: Secondary | ICD-10-CM | POA: Diagnosis not present

## 2023-11-25 DIAGNOSIS — Z7982 Long term (current) use of aspirin: Secondary | ICD-10-CM | POA: Diagnosis not present

## 2023-11-25 DIAGNOSIS — F0390 Unspecified dementia without behavioral disturbance: Secondary | ICD-10-CM | POA: Diagnosis not present

## 2023-11-25 DIAGNOSIS — Z556 Problems related to health literacy: Secondary | ICD-10-CM | POA: Diagnosis not present

## 2023-11-26 DIAGNOSIS — F0390 Unspecified dementia without behavioral disturbance: Secondary | ICD-10-CM | POA: Diagnosis not present

## 2023-11-26 DIAGNOSIS — I429 Cardiomyopathy, unspecified: Secondary | ICD-10-CM | POA: Diagnosis not present

## 2023-11-26 DIAGNOSIS — N390 Urinary tract infection, site not specified: Secondary | ICD-10-CM | POA: Diagnosis not present

## 2023-11-26 DIAGNOSIS — N1831 Chronic kidney disease, stage 3a: Secondary | ICD-10-CM | POA: Diagnosis not present

## 2023-11-26 DIAGNOSIS — M8008XD Age-related osteoporosis with current pathological fracture, vertebra(e), subsequent encounter for fracture with routine healing: Secondary | ICD-10-CM | POA: Diagnosis not present

## 2023-11-26 DIAGNOSIS — I252 Old myocardial infarction: Secondary | ICD-10-CM | POA: Diagnosis not present

## 2023-11-27 ENCOUNTER — Ambulatory Visit: Payer: Self-pay

## 2023-11-27 ENCOUNTER — Encounter: Payer: Self-pay | Admitting: Family Medicine

## 2023-11-27 ENCOUNTER — Other Ambulatory Visit: Payer: Self-pay | Admitting: Physician Assistant

## 2023-11-27 ENCOUNTER — Encounter: Payer: Self-pay | Admitting: Physician Assistant

## 2023-11-27 ENCOUNTER — Other Ambulatory Visit (INDEPENDENT_AMBULATORY_CARE_PROVIDER_SITE_OTHER): Payer: Self-pay

## 2023-11-27 ENCOUNTER — Ambulatory Visit: Admitting: Physician Assistant

## 2023-11-27 ENCOUNTER — Other Ambulatory Visit: Payer: Self-pay

## 2023-11-27 DIAGNOSIS — N390 Urinary tract infection, site not specified: Secondary | ICD-10-CM | POA: Diagnosis not present

## 2023-11-27 DIAGNOSIS — I252 Old myocardial infarction: Secondary | ICD-10-CM | POA: Diagnosis not present

## 2023-11-27 DIAGNOSIS — I429 Cardiomyopathy, unspecified: Secondary | ICD-10-CM | POA: Diagnosis not present

## 2023-11-27 DIAGNOSIS — N1831 Chronic kidney disease, stage 3a: Secondary | ICD-10-CM | POA: Diagnosis not present

## 2023-11-27 DIAGNOSIS — R300 Dysuria: Secondary | ICD-10-CM | POA: Diagnosis not present

## 2023-11-27 DIAGNOSIS — M8008XD Age-related osteoporosis with current pathological fracture, vertebra(e), subsequent encounter for fracture with routine healing: Secondary | ICD-10-CM | POA: Diagnosis not present

## 2023-11-27 DIAGNOSIS — F0390 Unspecified dementia without behavioral disturbance: Secondary | ICD-10-CM | POA: Diagnosis not present

## 2023-11-27 DIAGNOSIS — N3000 Acute cystitis without hematuria: Secondary | ICD-10-CM

## 2023-11-27 LAB — POCT URINALYSIS DIP (CLINITEK)
Bilirubin, UA: NEGATIVE
Blood, UA: NEGATIVE
Glucose, UA: NEGATIVE mg/dL
Ketones, POC UA: NEGATIVE mg/dL
Nitrite, UA: POSITIVE — AB
POC PROTEIN,UA: 30 — AB
Spec Grav, UA: 1.02 (ref 1.010–1.025)
Urobilinogen, UA: 0.2 U/dL
pH, UA: 6 (ref 5.0–8.0)

## 2023-11-27 MED ORDER — NITROFURANTOIN MONOHYD MACRO 100 MG PO CAPS: 100.0000 mg | ORAL_CAPSULE | Freq: Two times a day (BID) | ORAL | 0 refills | Status: AC

## 2023-11-27 NOTE — Telephone Encounter (Signed)
  Chief Complaint: urine odor, frequency, incontinence Symptoms: see above Frequency: for two days Pertinent Negatives: Patient denies fever, pain Disposition: [] ED /[] Urgent Care (no appt availability in office) / [] Appointment(In office/virtual)/ []  [place] [facility]/ [] Home Care/ [x] Refused Recommended Disposition /[] [place] [facility]/ []  Follow-up with PCP Additional Notes: declined apt., wants antibiotic sent in for her. Care advice given, denies questions; instructed to go to ER if becomes worse.

## 2023-11-27 NOTE — Telephone Encounter (Signed)
 Copied from CRM [id number]. Topic: Clinical - Medical Advice >> Nov 27, 2023 10:54 AM Turkey B wrote: Reason for CRM: pt's husband called in, states pt has uti symptms, urine has odor, didnt want to make an appt. He states she had this a month ago, with smelly urine. He is asking for med to be sent in.

## 2023-11-27 NOTE — Telephone Encounter (Signed)
 Copied from CRM [id number]. Topic: Clinical - Red Word Triage >> Nov 27, 2023  1:30 PM Shardie S wrote: [facility] that prompted transfer to Nurse Triage: urine with foul odor   Chief Complaint: Foul urine odor Symptoms: abd pain and weakness Frequency: constant Pertinent Negatives: Patient denies fever Disposition: [] ED /[] Urgent Care (no appt availability in office) / [x] Appointment(In office/virtual)/ []  [place] [facility]/ [] Home Care/ [] Refused Recommended Disposition /[] [place] [facility]/ []  Follow-up with PCP Additional Notes: Husband reports foul smelling urine odor since yesterday as well as abdominal pain and general weakness. Per husband, [facility] collected urine sample this morning and is bringing it into the office. RN advising office with next 24 hours, husband is agreeable. Appt scheduled for today at 2:40pm  Reason for Disposition  Bad or foul-smelling urine  Answer Assessment - Initial Assessment Questions 1. SYMPTOM: "What's the main symptom you're concerned about?" (e.g., frequency, incontinence)     Foul odor  2. ONSET: "When did the  odor  start?"     Yesterday  3. PAIN: "Is there any pain?" If Yes, ask: "How bad is it?" (Scale: 1-10; mild, moderate, severe)     Unsure of pain  4. CAUSE: "What do you think is causing the symptoms?"     Possible uti  5. OTHER SYMPTOMS: "Do you have any other symptoms?" (e.g., blood in urine, fever, flank pain, pain with urination)     Abdominal pain, getting weak last couple of days,   6. PREGNANCY: "Is there any chance you are pregnant?" "When was your last menstrual period?"     no  Protocols used: Urinary Symptoms-A-AH

## 2023-11-27 NOTE — Telephone Encounter (Signed)
 Reason for Disposition . Urinating more frequently than usual (i.e., frequency)  Answer Assessment - Initial Assessment Questions 1. SYMPTOM: "What's the main symptom you're concerned about?" (e.g., frequency, incontinence)     Odor, frequency, incontinence 2. ONSET: "When did the  symptoms  start?"     Two days ago 3. PAIN: "Is there any pain?" If Yes, ask: "How bad is it?" (Scale: 1-10; mild, moderate, severe)     denies 4. CAUSE: "What do you think is causing the symptoms?"     uti 5. OTHER SYMPTOMS: "Do you have any other symptoms?" (e.g., blood in urine, fever, flank pain, pain with urination)     denies 6. PREGNANCY: "Is there any chance you are pregnant?" "When was your last menstrual period?"     na  Protocols used: Urinary Symptoms-A-AH

## 2023-11-27 NOTE — Telephone Encounter (Signed)
--   urine results are in , please advise Copied from CRM [id number]. Topic: Clinical - Home Health Verbal Orders >> Nov 27, 2023 11:06 AM Leory Rands wrote: Alica with [facility] is calling because the pt husband is calling to report that the patients urine is cloudy with a strong smell. Evalyn Hillier states if she can get an order for a UA that she can get a cup to the paitient. And the husband can bring the cup to the office. Please advise CB- [phone]  Pt will be going out to the home today at 12:30p. If the UA can be order prior to that Pt appt so that the UA can be taken at this time.

## 2023-11-30 ENCOUNTER — Other Ambulatory Visit: Payer: Self-pay | Admitting: Family Medicine

## 2023-11-30 DIAGNOSIS — I429 Cardiomyopathy, unspecified: Secondary | ICD-10-CM | POA: Diagnosis not present

## 2023-11-30 DIAGNOSIS — N1831 Chronic kidney disease, stage 3a: Secondary | ICD-10-CM | POA: Diagnosis not present

## 2023-11-30 DIAGNOSIS — F0390 Unspecified dementia without behavioral disturbance: Secondary | ICD-10-CM | POA: Diagnosis not present

## 2023-11-30 DIAGNOSIS — M8008XD Age-related osteoporosis with current pathological fracture, vertebra(e), subsequent encounter for fracture with routine healing: Secondary | ICD-10-CM | POA: Diagnosis not present

## 2023-11-30 DIAGNOSIS — N390 Urinary tract infection, site not specified: Secondary | ICD-10-CM | POA: Diagnosis not present

## 2023-11-30 DIAGNOSIS — I252 Old myocardial infarction: Secondary | ICD-10-CM | POA: Diagnosis not present

## 2023-11-30 MED ORDER — CEPHALEXIN 500 MG PO CAPS: 500.0000 mg | ORAL_CAPSULE | Freq: Two times a day (BID) | ORAL | 0 refills | Status: DC

## 2023-12-01 ENCOUNTER — Encounter: Payer: Self-pay | Admitting: Physician Assistant

## 2023-12-01 DIAGNOSIS — M8008XD Age-related osteoporosis with current pathological fracture, vertebra(e), subsequent encounter for fracture with routine healing: Secondary | ICD-10-CM | POA: Diagnosis not present

## 2023-12-01 DIAGNOSIS — I252 Old myocardial infarction: Secondary | ICD-10-CM | POA: Diagnosis not present

## 2023-12-01 DIAGNOSIS — F0390 Unspecified dementia without behavioral disturbance: Secondary | ICD-10-CM | POA: Diagnosis not present

## 2023-12-01 DIAGNOSIS — I429 Cardiomyopathy, unspecified: Secondary | ICD-10-CM | POA: Diagnosis not present

## 2023-12-01 DIAGNOSIS — N390 Urinary tract infection, site not specified: Secondary | ICD-10-CM | POA: Diagnosis not present

## 2023-12-01 DIAGNOSIS — N1831 Chronic kidney disease, stage 3a: Secondary | ICD-10-CM | POA: Diagnosis not present

## 2023-12-01 LAB — URINE CULTURE

## 2023-12-01 LAB — SPECIMEN STATUS REPORT

## 2023-12-07 DIAGNOSIS — F0390 Unspecified dementia without behavioral disturbance: Secondary | ICD-10-CM | POA: Diagnosis not present

## 2023-12-07 DIAGNOSIS — M8008XD Age-related osteoporosis with current pathological fracture, vertebra(e), subsequent encounter for fracture with routine healing: Secondary | ICD-10-CM | POA: Diagnosis not present

## 2023-12-07 DIAGNOSIS — N1831 Chronic kidney disease, stage 3a: Secondary | ICD-10-CM | POA: Diagnosis not present

## 2023-12-07 DIAGNOSIS — N390 Urinary tract infection, site not specified: Secondary | ICD-10-CM | POA: Diagnosis not present

## 2023-12-07 DIAGNOSIS — I252 Old myocardial infarction: Secondary | ICD-10-CM | POA: Diagnosis not present

## 2023-12-07 DIAGNOSIS — I429 Cardiomyopathy, unspecified: Secondary | ICD-10-CM | POA: Diagnosis not present

## 2023-12-08 DIAGNOSIS — M8008XD Age-related osteoporosis with current pathological fracture, vertebra(e), subsequent encounter for fracture with routine healing: Secondary | ICD-10-CM | POA: Diagnosis not present

## 2023-12-08 DIAGNOSIS — N390 Urinary tract infection, site not specified: Secondary | ICD-10-CM | POA: Diagnosis not present

## 2023-12-08 DIAGNOSIS — F0390 Unspecified dementia without behavioral disturbance: Secondary | ICD-10-CM | POA: Diagnosis not present

## 2023-12-08 DIAGNOSIS — I252 Old myocardial infarction: Secondary | ICD-10-CM | POA: Diagnosis not present

## 2023-12-08 DIAGNOSIS — I429 Cardiomyopathy, unspecified: Secondary | ICD-10-CM | POA: Diagnosis not present

## 2023-12-08 DIAGNOSIS — N1831 Chronic kidney disease, stage 3a: Secondary | ICD-10-CM | POA: Diagnosis not present

## 2023-12-09 DIAGNOSIS — N390 Urinary tract infection, site not specified: Secondary | ICD-10-CM | POA: Diagnosis not present

## 2023-12-09 DIAGNOSIS — I252 Old myocardial infarction: Secondary | ICD-10-CM | POA: Diagnosis not present

## 2023-12-09 DIAGNOSIS — M8008XD Age-related osteoporosis with current pathological fracture, vertebra(e), subsequent encounter for fracture with routine healing: Secondary | ICD-10-CM | POA: Diagnosis not present

## 2023-12-09 DIAGNOSIS — I429 Cardiomyopathy, unspecified: Secondary | ICD-10-CM | POA: Diagnosis not present

## 2023-12-09 DIAGNOSIS — F0390 Unspecified dementia without behavioral disturbance: Secondary | ICD-10-CM | POA: Diagnosis not present

## 2023-12-09 DIAGNOSIS — N1831 Chronic kidney disease, stage 3a: Secondary | ICD-10-CM | POA: Diagnosis not present

## 2023-12-15 DIAGNOSIS — I429 Cardiomyopathy, unspecified: Secondary | ICD-10-CM | POA: Diagnosis not present

## 2023-12-15 DIAGNOSIS — N390 Urinary tract infection, site not specified: Secondary | ICD-10-CM | POA: Diagnosis not present

## 2023-12-15 DIAGNOSIS — M8008XD Age-related osteoporosis with current pathological fracture, vertebra(e), subsequent encounter for fracture with routine healing: Secondary | ICD-10-CM | POA: Diagnosis not present

## 2023-12-15 DIAGNOSIS — I252 Old myocardial infarction: Secondary | ICD-10-CM | POA: Diagnosis not present

## 2023-12-15 DIAGNOSIS — N1831 Chronic kidney disease, stage 3a: Secondary | ICD-10-CM | POA: Diagnosis not present

## 2023-12-15 DIAGNOSIS — F0390 Unspecified dementia without behavioral disturbance: Secondary | ICD-10-CM | POA: Diagnosis not present

## 2023-12-16 DIAGNOSIS — F0390 Unspecified dementia without behavioral disturbance: Secondary | ICD-10-CM | POA: Diagnosis not present

## 2023-12-16 DIAGNOSIS — I429 Cardiomyopathy, unspecified: Secondary | ICD-10-CM | POA: Diagnosis not present

## 2023-12-16 DIAGNOSIS — N390 Urinary tract infection, site not specified: Secondary | ICD-10-CM | POA: Diagnosis not present

## 2023-12-16 DIAGNOSIS — I252 Old myocardial infarction: Secondary | ICD-10-CM | POA: Diagnosis not present

## 2023-12-16 DIAGNOSIS — M8008XD Age-related osteoporosis with current pathological fracture, vertebra(e), subsequent encounter for fracture with routine healing: Secondary | ICD-10-CM | POA: Diagnosis not present

## 2023-12-16 DIAGNOSIS — N1831 Chronic kidney disease, stage 3a: Secondary | ICD-10-CM | POA: Diagnosis not present

## 2023-12-17 DIAGNOSIS — N390 Urinary tract infection, site not specified: Secondary | ICD-10-CM | POA: Diagnosis not present

## 2023-12-17 DIAGNOSIS — I252 Old myocardial infarction: Secondary | ICD-10-CM | POA: Diagnosis not present

## 2023-12-17 DIAGNOSIS — F0390 Unspecified dementia without behavioral disturbance: Secondary | ICD-10-CM | POA: Diagnosis not present

## 2023-12-17 DIAGNOSIS — M8008XD Age-related osteoporosis with current pathological fracture, vertebra(e), subsequent encounter for fracture with routine healing: Secondary | ICD-10-CM | POA: Diagnosis not present

## 2023-12-17 DIAGNOSIS — N1831 Chronic kidney disease, stage 3a: Secondary | ICD-10-CM | POA: Diagnosis not present

## 2023-12-17 DIAGNOSIS — I429 Cardiomyopathy, unspecified: Secondary | ICD-10-CM | POA: Diagnosis not present

## 2023-12-20 ENCOUNTER — Encounter: Payer: Self-pay | Admitting: Family Medicine

## 2023-12-22 DIAGNOSIS — F0390 Unspecified dementia without behavioral disturbance: Secondary | ICD-10-CM | POA: Diagnosis not present

## 2023-12-22 DIAGNOSIS — N1831 Chronic kidney disease, stage 3a: Secondary | ICD-10-CM | POA: Diagnosis not present

## 2023-12-22 DIAGNOSIS — I429 Cardiomyopathy, unspecified: Secondary | ICD-10-CM | POA: Diagnosis not present

## 2023-12-22 DIAGNOSIS — N390 Urinary tract infection, site not specified: Secondary | ICD-10-CM | POA: Diagnosis not present

## 2023-12-22 DIAGNOSIS — I252 Old myocardial infarction: Secondary | ICD-10-CM | POA: Diagnosis not present

## 2023-12-22 DIAGNOSIS — M8008XD Age-related osteoporosis with current pathological fracture, vertebra(e), subsequent encounter for fracture with routine healing: Secondary | ICD-10-CM | POA: Diagnosis not present

## 2023-12-23 DIAGNOSIS — I429 Cardiomyopathy, unspecified: Secondary | ICD-10-CM | POA: Diagnosis not present

## 2023-12-23 DIAGNOSIS — F0390 Unspecified dementia without behavioral disturbance: Secondary | ICD-10-CM | POA: Diagnosis not present

## 2023-12-23 DIAGNOSIS — M8008XD Age-related osteoporosis with current pathological fracture, vertebra(e), subsequent encounter for fracture with routine healing: Secondary | ICD-10-CM | POA: Diagnosis not present

## 2023-12-23 DIAGNOSIS — I252 Old myocardial infarction: Secondary | ICD-10-CM | POA: Diagnosis not present

## 2023-12-23 DIAGNOSIS — N1831 Chronic kidney disease, stage 3a: Secondary | ICD-10-CM | POA: Diagnosis not present

## 2023-12-23 DIAGNOSIS — N390 Urinary tract infection, site not specified: Secondary | ICD-10-CM | POA: Diagnosis not present

## 2023-12-31 ENCOUNTER — Ambulatory Visit: Admitting: Family Medicine

## 2024-01-05 ENCOUNTER — Ambulatory Visit: Admitting: Family Medicine

## 2024-01-05 ENCOUNTER — Encounter: Payer: Self-pay | Admitting: Family Medicine

## 2024-01-05 VITALS — BP 126/78 | HR 82 | Temp 97.4°F | Ht 64.0 in | Wt 107.0 lb

## 2024-01-05 DIAGNOSIS — F0390 Unspecified dementia without behavioral disturbance: Secondary | ICD-10-CM

## 2024-01-05 DIAGNOSIS — N390 Urinary tract infection, site not specified: Secondary | ICD-10-CM | POA: Diagnosis not present

## 2024-01-05 MED ORDER — NITROFURANTOIN MONOHYD MACRO 100 MG PO CAPS: 100.0000 mg | ORAL_CAPSULE | Freq: Every day | ORAL | 1 refills | Status: DC

## 2024-01-05 NOTE — Progress Notes (Signed)
 Subjective:  Patient ID: Jeanne Rogers, female    DOB: Mar 05, 1941  Age: 83 y.o. MRN: [id number]  CC:   Chief Complaint  Patient presents with   request for home health orders     For a nurse every other week and a cna weekly- bathing, med check, pt Hx of reocurrent uti- on abx - is requesting an order for prophylactic abx     HPI:  83 year old female with the below mentioned medical problems presents for evaluation of the above.  Husband states that she is declining.  She is not speaking very much.  She is able to ambulate but needs help with normal activities of daily living.  She is able to feed herself.  He states that he feels like she needs additional services at home through home health.  These have been very helpful.  He would like me to place a referral for these.  Additionally, patient has a history of recurrent UTI.  Husband would like to discuss prophylactic medication.  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   Compression fracture of L2 lumbar vertebra (HCC) 10/22/2023   Iron  deficiency anemia 08/07/2022   Actinic keratosis 08/07/2022   Stage 3a chronic kidney disease (HCC) 03/27/2022   History of non-ST elevation myocardial infarction (NSTEMI) 03/26/2022   Takotsubo cardiomyopathy    Dementia (HCC) 10/11/2019   Hyperlipidemia 08/26/2019   Osteoporosis 06/29/2013    Social Hx   Social History   Socioeconomic History   Marital status: Married    Spouse name: Not on file   Number of children: Not on file   Years of education: Not on file   Highest education level: Master's degree (e.g., MA, MS, MEng, MEd, MSW, MBA)  Occupational History   Not on file  Tobacco Use   Smoking status: Former    Current packs/day: 0.00    Types: Cigarettes    Quit date: 10/02/1988    Years since quitting: 35.2   Smokeless tobacco: Never  Vaping Use   Vaping status: Never Used  Substance and Sexual Activity   Alcohol use: No   Drug  use: No   Sexual activity: Not Currently  Other Topics Concern   Not on file  Social History Narrative   Not on file   Social Drivers of Health   Financial Resource Strain: Low Risk  (10/20/2023)   Overall Financial Resource Strain (CARDIA)    Difficulty of Paying Living Expenses: Not hard at all  Food Insecurity: No Food Insecurity (10/20/2023)   Hunger Vital Sign    Worried About Running Out of Food in the Last Year: Never true    Ran Out of Food in the Last Year: Never true  Transportation Needs: No Transportation Needs (10/20/2023)   PRAPARE - Administrator, Civil Service (Medical): No    Lack of Transportation (Non-Medical): No  Physical Activity: Unknown (10/20/2023)   Exercise Vital Sign    Days of Exercise per Week: 0 days    Minutes of Exercise per Session: Not on file  Stress: No Stress Concern Present (10/20/2023)   [employer] of Occupational Health - Occupational Stress Questionnaire    Feeling of Stress : Only a little  Social Connections: Socially Integrated (10/20/2023)   Social Connection and Isolation Panel [NHANES]    Frequency of Communication with Friends and Family: Twice a week    Frequency of Social Gatherings with Friends and Family: Three times  a week    Attends Religious Services: 1 to 4 times per year    Active Member of Clubs or Organizations: Yes    Attends Banker Meetings: 1 to 4 times per year    Marital Status: Married    Review of Systems Per HPI  Objective:  BP 126/78   Pulse 82   Temp (!) 97.4 F (36.3 C)   Ht 5\' 4"  (1.626 m)   Wt 107 lb (48.5 kg)   SpO2 95%   BMI 18.37 kg/m      01/05/2024   10:13 AM 10/21/2023    4:11 PM 10/18/2023    4:06 PM  BP/Weight  Systolic BP 126 108 160  Diastolic BP 78 58 80  Wt. (Lbs) 107 110   BMI 18.37 kg/m2 18.88 kg/m2     Physical Exam Vitals and nursing note reviewed.  Constitutional:      General: She is not in acute distress.    Appearance: Normal  appearance.  HENT:     Head: Normocephalic and atraumatic.  Cardiovascular:     Rate and Rhythm: Normal rate and regular rhythm.  Pulmonary:     Effort: Pulmonary effort is normal. No respiratory distress.  Abdominal:     General: There is no distension.     Palpations: Abdomen is soft.     Tenderness: There is no abdominal tenderness.  Neurological:     Mental Status: She is alert. Mental status is at baseline.     Lab Results  Component Value Date   WBC 8.7 10/21/2023   HGB 14.0 10/21/2023   HCT 40.7 10/21/2023   PLT 290 10/21/2023   GLUCOSE 120 (H) 10/21/2023   CHOL 242 (H) 09/25/2021   TRIG 131 09/25/2021   HDL 76 09/25/2021   LDLCALC 143 (H) 09/25/2021   ALT 18 10/21/2023   AST 19 10/21/2023   NA 143 10/21/2023   K 3.6 10/21/2023   CL 102 10/21/2023   CREATININE 0.87 10/21/2023   BUN 23 10/21/2023   CO2 24 10/21/2023   TSH 2.521 08/25/2019   INR 1.0 08/24/2019   HGBA1C 5.4 05/10/2020     Assessment & Plan:  Dementia, unspecified dementia severity, unspecified dementia type, unspecified whether behavioral, psychotic, or mood disturbance or anxiety (HCC) Assessment & Plan: Patient is declining.  Husband states that home health services are quite helpful.  Placing referral.  Orders: -     Ambulatory referral to Home Health  Recurrent UTI Assessment & Plan: After discussion with husband, placing on prophylactic Macrobid .  Orders: -     Nitrofurantoin  Monohyd Macro; Take 1 capsule (100 mg total) by mouth at bedtime.  Dispense: 90 capsule; Refill: 1   Aydyn Testerman DO [facility]

## 2024-01-05 NOTE — Assessment & Plan Note (Signed)
 Patient is declining.  Husband states that home health services are quite helpful.  Placing referral.

## 2024-01-05 NOTE — Patient Instructions (Signed)
Medication as directed.  Referral placed.  Take care  Dr. Jahlisa Rossitto  

## 2024-01-05 NOTE — Assessment & Plan Note (Signed)
 After discussion with husband, placing on prophylactic Macrobid .

## 2024-01-07 DIAGNOSIS — Z9181 History of falling: Secondary | ICD-10-CM | POA: Diagnosis not present

## 2024-01-07 DIAGNOSIS — N1831 Chronic kidney disease, stage 3a: Secondary | ICD-10-CM | POA: Diagnosis not present

## 2024-01-07 DIAGNOSIS — I252 Old myocardial infarction: Secondary | ICD-10-CM | POA: Diagnosis not present

## 2024-01-07 DIAGNOSIS — D631 Anemia in chronic kidney disease: Secondary | ICD-10-CM | POA: Diagnosis not present

## 2024-01-07 DIAGNOSIS — Z7982 Long term (current) use of aspirin: Secondary | ICD-10-CM | POA: Diagnosis not present

## 2024-01-07 DIAGNOSIS — Z87891 Personal history of nicotine dependence: Secondary | ICD-10-CM | POA: Diagnosis not present

## 2024-01-07 DIAGNOSIS — D509 Iron deficiency anemia, unspecified: Secondary | ICD-10-CM | POA: Diagnosis not present

## 2024-01-07 DIAGNOSIS — N390 Urinary tract infection, site not specified: Secondary | ICD-10-CM | POA: Diagnosis not present

## 2024-01-07 DIAGNOSIS — I429 Cardiomyopathy, unspecified: Secondary | ICD-10-CM | POA: Diagnosis not present

## 2024-01-07 DIAGNOSIS — M8008XD Age-related osteoporosis with current pathological fracture, vertebra(e), subsequent encounter for fracture with routine healing: Secondary | ICD-10-CM | POA: Diagnosis not present

## 2024-01-07 DIAGNOSIS — F0390 Unspecified dementia without behavioral disturbance: Secondary | ICD-10-CM | POA: Diagnosis not present

## 2024-01-07 DIAGNOSIS — Z556 Problems related to health literacy: Secondary | ICD-10-CM | POA: Diagnosis not present

## 2024-01-13 DIAGNOSIS — D631 Anemia in chronic kidney disease: Secondary | ICD-10-CM | POA: Diagnosis not present

## 2024-01-13 DIAGNOSIS — S32020D Wedge compression fracture of second lumbar vertebra, subsequent encounter for fracture with routine healing: Secondary | ICD-10-CM | POA: Diagnosis not present

## 2024-01-13 DIAGNOSIS — N390 Urinary tract infection, site not specified: Secondary | ICD-10-CM | POA: Diagnosis not present

## 2024-01-13 DIAGNOSIS — M810 Age-related osteoporosis without current pathological fracture: Secondary | ICD-10-CM | POA: Diagnosis not present

## 2024-01-13 DIAGNOSIS — N1831 Chronic kidney disease, stage 3a: Secondary | ICD-10-CM | POA: Diagnosis not present

## 2024-01-13 DIAGNOSIS — Z7982 Long term (current) use of aspirin: Secondary | ICD-10-CM | POA: Diagnosis not present

## 2024-01-13 DIAGNOSIS — I214 Non-ST elevation (NSTEMI) myocardial infarction: Secondary | ICD-10-CM | POA: Diagnosis not present

## 2024-01-13 DIAGNOSIS — Z556 Problems related to health literacy: Secondary | ICD-10-CM | POA: Diagnosis not present

## 2024-01-13 DIAGNOSIS — Z604 Social exclusion and rejection: Secondary | ICD-10-CM | POA: Diagnosis not present

## 2024-01-13 DIAGNOSIS — Z87891 Personal history of nicotine dependence: Secondary | ICD-10-CM | POA: Diagnosis not present

## 2024-01-13 DIAGNOSIS — F0390 Unspecified dementia without behavioral disturbance: Secondary | ICD-10-CM | POA: Diagnosis not present

## 2024-01-14 DIAGNOSIS — F0390 Unspecified dementia without behavioral disturbance: Secondary | ICD-10-CM | POA: Diagnosis not present

## 2024-01-14 DIAGNOSIS — I214 Non-ST elevation (NSTEMI) myocardial infarction: Secondary | ICD-10-CM | POA: Diagnosis not present

## 2024-01-14 DIAGNOSIS — N1831 Chronic kidney disease, stage 3a: Secondary | ICD-10-CM | POA: Diagnosis not present

## 2024-01-14 DIAGNOSIS — M810 Age-related osteoporosis without current pathological fracture: Secondary | ICD-10-CM | POA: Diagnosis not present

## 2024-01-14 DIAGNOSIS — S32020D Wedge compression fracture of second lumbar vertebra, subsequent encounter for fracture with routine healing: Secondary | ICD-10-CM | POA: Diagnosis not present

## 2024-01-14 DIAGNOSIS — D631 Anemia in chronic kidney disease: Secondary | ICD-10-CM | POA: Diagnosis not present

## 2024-01-19 ENCOUNTER — Encounter: Payer: Self-pay | Admitting: Nurse Practitioner

## 2024-01-19 ENCOUNTER — Ambulatory Visit: Payer: Medicare Other | Attending: Nurse Practitioner | Admitting: Nurse Practitioner

## 2024-01-19 VITALS — BP 118/72 | HR 92 | Ht 64.0 in | Wt 108.4 lb

## 2024-01-19 DIAGNOSIS — E785 Hyperlipidemia, unspecified: Secondary | ICD-10-CM | POA: Diagnosis not present

## 2024-01-19 DIAGNOSIS — Z8673 Personal history of transient ischemic attack (TIA), and cerebral infarction without residual deficits: Secondary | ICD-10-CM | POA: Diagnosis not present

## 2024-01-19 DIAGNOSIS — I5032 Chronic diastolic (congestive) heart failure: Secondary | ICD-10-CM | POA: Insufficient documentation

## 2024-01-19 DIAGNOSIS — I10 Essential (primary) hypertension: Secondary | ICD-10-CM

## 2024-01-19 DIAGNOSIS — I252 Old myocardial infarction: Secondary | ICD-10-CM | POA: Diagnosis not present

## 2024-01-19 DIAGNOSIS — I5181 Takotsubo syndrome: Secondary | ICD-10-CM

## 2024-01-19 NOTE — Patient Instructions (Addendum)

## 2024-01-19 NOTE — Progress Notes (Addendum)
 Office Visit    Patient Name: Jeanne Rogers Date of Encounter: 01/19/2024 PCP:  Cook, Jayce G, DO [facility]  Cardiologist:  Maude Emmer, MD  Advanced Practice Provider:  Miriam Norris, NP Electrophysiologist:  None   Chief Complaint and HPI    Jeanne Rogers is a 83 y.o. female with a hx of Takotsubo cardiomyopathy, history of NSTEMI, hypertension, remote TIA, hyperlipidemia, history of spontaneous pneumothorax, history of mild dementia, who presents today for scheduled follow-up.   Previous cardiovascular history includes NSTEMI in 2021.  Cardiac catheterization revealed normal coronary arteries.  Echocardiogram at that time revealed EF 30 to 35%, was determined to be due to Takotsubo cardiomyopathy.  She has had limited GDMT due to low blood pressures.  Repeat echocardiogram in 2022 showed recovered EF at 65 to 70%.  Last seen by Dr. Emmer on March 11, 2021.  She was overall doing well from a cardiac perspective.  I last saw her on January 08, 2023 for preoperative cardiovascular risk assessment.  She was pending lumbar kyphoplasty with Dr. Burnetta at [facility].  Was doing well at the time.    07/17/2023 - Today she presents for follow-up with her husband who also helps as her historian.  She is doing well denies any acute cardiac complaints or issues.  Tolerated her procedure well. Denies any chest pain, shortness of breath, palpitations, syncope, presyncope, dizziness, orthopnea, PND, swelling or significant weight changes, acute bleeding, or claudication.  01/19/2024 - Here for follow-up with her husband.  She is a poor historian due to history of dementia.  Husband says she had a fall in March.  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 Husband says she is mainly sedentary. Pt without complaints today. Denies  any chest pain, shortness of breath, palpitations, syncope, presyncope, dizziness, orthopnea, PND, swelling or significant weight changes, acute bleeding, or claudication. Denies any more recent falls.   SH: Former Physicist, medical member until 2017, daughter is a Therapist, music in [place], 2 children are Counsellor, 1 child is the ER doctor at [employer]. Married for nearly 60 years. Enjoys playing cards with her husband in her free time.   EKGs/Labs/Other Studies Reviewed:   The following studies were reviewed today:   EKG:   EKG Interpretation Date/Time:  Tuesday January 19 2024 10:40:09 EDT Ventricular Rate:  80 PR Interval:  128 QRS Duration:  76 QT Interval:  370 QTC Calculation: 426 R Axis:   23  Text Interpretation: Normal sinus rhythm with sinus arrhythmia Nonspecific ST and T wave abnormality When compared with ECG of 14-May-2020 15:40, Questionable change in QRS axis T wave inversion no longer evident in Inferior leads Nonspecific T wave abnormality has replaced inverted T waves in Lateral leads Confirmed by Miriam Norris [phone]) on 01/19/2024 10:41:48 AM    Echo 08/2020:   1. Left ventricular ejection fraction, by estimation, is 65 to 70%. The  left ventricle has normal function. The left ventricle has no regional  wall motion abnormalities. There is mild left ventricular hypertrophy.  Left ventricular diastolic parameters  are consistent with Grade I diastolic dysfunction (impaired relaxation).   2. Right ventricular systolic function is normal. The right ventricular  size is normal. There is normal pulmonary artery systolic pressure.   3. The mitral valve is normal in structure. No evidence of mitral valve  regurgitation. No evidence of mitral stenosis.   4. The aortic valve is tricuspid. Aortic valve regurgitation is not  visualized. No aortic stenosis is present.   5. The inferior vena cava is normal in size with greater than 50%  respiratory variability, suggesting  right atrial pressure of 3 mmHg.  LHC 05/2020:  Right dominant coronary anatomy Widely patent left main Widely patent LAD Widely patent circumflex Widely patent RCA Coronary tortuosity Apical ballooning/Takotsubo cardiomyopathy with LVEDP 6 mmHg.  EF 30 to 35%.   RECOMMENDATIONS:   Supportive therapy Wean and DC nitro as tolerated given low LVEDP. Convert anticoagulation to subcu.  Echo 05/2020:   1. Left ventricular ejection fraction, by estimation, is 30 to 35%. The  left ventricle has moderately decreased function. The left ventricle  demonstrates regional wall motion abnormalities with mid to apical  anteroseptal and inferoseptal akinesis.  Apical lateral, apical anterior, and apical inferior akinesis. Akinesis of  the true apex. This pattern suggests LAD territory infarction. No LV  thrombus visualized. There is mild left ventricular hypertrophy. Left  ventricular diastolic parameters are  consistent with Grade I diastolic dysfunction (impaired relaxation).   2. Right ventricular systolic function is normal. The right ventricular  size is normal. There is mildly elevated pulmonary artery systolic  pressure. The estimated right ventricular systolic pressure is 36.9 mmHg.   3. The mitral valve is normal in structure. No evidence of mitral valve  regurgitation. No evidence of mitral stenosis.   4. The aortic valve is tricuspid. Aortic valve regurgitation is not  visualized. No aortic stenosis is present.   5. A small pericardial effusion is present, primarily adjacent to the RV  free wall.   6. The inferior vena cava is normal in size with <50% respiratory  variability, suggesting right atrial pressure of 8 mmHg.  Review of Systems    All other systems reviewed and are otherwise negative except as noted above.  Physical Exam    VS:  BP 118/72   Pulse 92   Ht 5' 4 (1.626 m)   Wt 108 lb 6.4 oz (49.2 kg)   SpO2 99%   BMI 18.61 kg/m  , BMI Body mass index is 18.61  kg/m.  Wt Readings from Last 3 Encounters:  01/19/24 108 lb 6.4 oz (49.2 kg)  01/05/24 107 lb (48.5 kg)  10/21/23 110 lb (49.9 kg)     GEN: Thin, frail 83 y.o. female in no acute distress. HEENT: normal. Neck: Supple, no JVD, carotid bruits, or masses. Cardiac: S1/S2, RRR, no murmurs, rubs, or gallops. No clubbing, cyanosis, edema.  Radials/PT 2+ and equal bilaterally.  Respiratory:  Respirations regular and unlabored, clear to auscultation bilaterally. MS: No deformity or atrophy. Skin: Thin skin, warm and dry, no rash. Neuro:  Strength and sensation are intact. Psych: Normal affect.  Assessment & Plan   1.  HFrecEF, Hx of Takotsubo cardiomyopathy, Echo in 2022 showed recovered EF. Euvolemic and well compensated on exam. Continue current medication regimen. Low sodium diet, fluid restriction <2L, and daily weights encouraged. Educated to contact our office for weight gain of 2 lbs overnight or 5 lbs in one week. Heart healthy diet and regular cardiovascular exercise encouraged.   2. Hx of NSTEMI LHC in 2021 showed normal coronaries. Stable with no anginal symptoms. No indication for ischemic evaluation. Continue current medication regimen. Heart healthy diet and regular cardiovascular exercise encouraged.   4. HLD, remote TIA Hx of statin intolerance. Husband reports prior hx of TIA many years  ago. Most recent LDL 186. Would not be a good candidate for PCSK9i d/t dementia. Did discuss Leqvio with husband and pt and seemed interested - information given about this. Will talk about this amongst the two of them. Will also consult clinical pharm D regarding this. Heart healthy diet and regular cardiovascular exercise encouraged.   5. HTN BP stable. Goal SBP < 140. No medication changes at this time. Discussed to monitor BP at home at least 2 hours after medications and sitting for 5-10 minutes. Heart healthy diet and regular cardiovascular exercise encouraged.    Disposition: Follow up  in 6 month(s) with Maude Emmer, MD or APP.  Signed, Almarie Crate, NP

## 2024-01-20 DIAGNOSIS — I214 Non-ST elevation (NSTEMI) myocardial infarction: Secondary | ICD-10-CM | POA: Diagnosis not present

## 2024-01-20 DIAGNOSIS — F0390 Unspecified dementia without behavioral disturbance: Secondary | ICD-10-CM | POA: Diagnosis not present

## 2024-01-20 DIAGNOSIS — N1831 Chronic kidney disease, stage 3a: Secondary | ICD-10-CM | POA: Diagnosis not present

## 2024-01-20 DIAGNOSIS — S32020D Wedge compression fracture of second lumbar vertebra, subsequent encounter for fracture with routine healing: Secondary | ICD-10-CM | POA: Diagnosis not present

## 2024-01-20 DIAGNOSIS — D631 Anemia in chronic kidney disease: Secondary | ICD-10-CM | POA: Diagnosis not present

## 2024-01-20 DIAGNOSIS — M810 Age-related osteoporosis without current pathological fracture: Secondary | ICD-10-CM | POA: Diagnosis not present

## 2024-01-21 ENCOUNTER — Telehealth: Payer: Self-pay | Admitting: *Deleted

## 2024-01-21 DIAGNOSIS — F0390 Unspecified dementia without behavioral disturbance: Secondary | ICD-10-CM | POA: Diagnosis not present

## 2024-01-21 DIAGNOSIS — S32020D Wedge compression fracture of second lumbar vertebra, subsequent encounter for fracture with routine healing: Secondary | ICD-10-CM | POA: Diagnosis not present

## 2024-01-21 DIAGNOSIS — D631 Anemia in chronic kidney disease: Secondary | ICD-10-CM | POA: Diagnosis not present

## 2024-01-21 DIAGNOSIS — M810 Age-related osteoporosis without current pathological fracture: Secondary | ICD-10-CM | POA: Diagnosis not present

## 2024-01-21 DIAGNOSIS — I214 Non-ST elevation (NSTEMI) myocardial infarction: Secondary | ICD-10-CM | POA: Diagnosis not present

## 2024-01-21 DIAGNOSIS — N1831 Chronic kidney disease, stage 3a: Secondary | ICD-10-CM | POA: Diagnosis not present

## 2024-01-21 NOTE — Telephone Encounter (Unsigned)
 Copied from CRM [id number]. Topic: Clinical - Home Health Verbal Orders >> Jan 21, 2024  3:45 PM Elle L wrote: Caller/Agency: Marily Shows Speech Therapist with [facility] Callback Number: [phone] Service Requested: Speech Therapy Frequency: Once a week for four weeks for cognition and caregiver education Any new concerns about the patient? No

## 2024-01-22 DIAGNOSIS — I214 Non-ST elevation (NSTEMI) myocardial infarction: Secondary | ICD-10-CM | POA: Diagnosis not present

## 2024-01-22 DIAGNOSIS — F0390 Unspecified dementia without behavioral disturbance: Secondary | ICD-10-CM | POA: Diagnosis not present

## 2024-01-22 DIAGNOSIS — M810 Age-related osteoporosis without current pathological fracture: Secondary | ICD-10-CM | POA: Diagnosis not present

## 2024-01-22 DIAGNOSIS — S32020D Wedge compression fracture of second lumbar vertebra, subsequent encounter for fracture with routine healing: Secondary | ICD-10-CM | POA: Diagnosis not present

## 2024-01-22 DIAGNOSIS — D631 Anemia in chronic kidney disease: Secondary | ICD-10-CM | POA: Diagnosis not present

## 2024-01-22 DIAGNOSIS — N1831 Chronic kidney disease, stage 3a: Secondary | ICD-10-CM | POA: Diagnosis not present

## 2024-01-25 DIAGNOSIS — N1831 Chronic kidney disease, stage 3a: Secondary | ICD-10-CM | POA: Diagnosis not present

## 2024-01-25 DIAGNOSIS — M810 Age-related osteoporosis without current pathological fracture: Secondary | ICD-10-CM | POA: Diagnosis not present

## 2024-01-25 DIAGNOSIS — F0390 Unspecified dementia without behavioral disturbance: Secondary | ICD-10-CM | POA: Diagnosis not present

## 2024-01-25 DIAGNOSIS — I214 Non-ST elevation (NSTEMI) myocardial infarction: Secondary | ICD-10-CM | POA: Diagnosis not present

## 2024-01-25 DIAGNOSIS — S32020D Wedge compression fracture of second lumbar vertebra, subsequent encounter for fracture with routine healing: Secondary | ICD-10-CM | POA: Diagnosis not present

## 2024-01-25 DIAGNOSIS — D631 Anemia in chronic kidney disease: Secondary | ICD-10-CM | POA: Diagnosis not present

## 2024-01-27 DIAGNOSIS — F0390 Unspecified dementia without behavioral disturbance: Secondary | ICD-10-CM | POA: Diagnosis not present

## 2024-01-27 DIAGNOSIS — S32020D Wedge compression fracture of second lumbar vertebra, subsequent encounter for fracture with routine healing: Secondary | ICD-10-CM | POA: Diagnosis not present

## 2024-01-27 DIAGNOSIS — D631 Anemia in chronic kidney disease: Secondary | ICD-10-CM | POA: Diagnosis not present

## 2024-01-27 DIAGNOSIS — I214 Non-ST elevation (NSTEMI) myocardial infarction: Secondary | ICD-10-CM | POA: Diagnosis not present

## 2024-01-27 DIAGNOSIS — N1831 Chronic kidney disease, stage 3a: Secondary | ICD-10-CM | POA: Diagnosis not present

## 2024-01-27 DIAGNOSIS — M810 Age-related osteoporosis without current pathological fracture: Secondary | ICD-10-CM | POA: Diagnosis not present

## 2024-01-27 NOTE — Telephone Encounter (Signed)
 Verbal order given to Dagoberto (speech therapist)

## 2024-02-01 DIAGNOSIS — S32020D Wedge compression fracture of second lumbar vertebra, subsequent encounter for fracture with routine healing: Secondary | ICD-10-CM | POA: Diagnosis not present

## 2024-02-01 DIAGNOSIS — F0390 Unspecified dementia without behavioral disturbance: Secondary | ICD-10-CM | POA: Diagnosis not present

## 2024-02-01 DIAGNOSIS — D631 Anemia in chronic kidney disease: Secondary | ICD-10-CM | POA: Diagnosis not present

## 2024-02-01 DIAGNOSIS — I214 Non-ST elevation (NSTEMI) myocardial infarction: Secondary | ICD-10-CM | POA: Diagnosis not present

## 2024-02-01 DIAGNOSIS — M810 Age-related osteoporosis without current pathological fracture: Secondary | ICD-10-CM | POA: Diagnosis not present

## 2024-02-01 DIAGNOSIS — N1831 Chronic kidney disease, stage 3a: Secondary | ICD-10-CM | POA: Diagnosis not present

## 2024-02-02 DIAGNOSIS — F0390 Unspecified dementia without behavioral disturbance: Secondary | ICD-10-CM | POA: Diagnosis not present

## 2024-02-02 DIAGNOSIS — D631 Anemia in chronic kidney disease: Secondary | ICD-10-CM | POA: Diagnosis not present

## 2024-02-02 DIAGNOSIS — M810 Age-related osteoporosis without current pathological fracture: Secondary | ICD-10-CM | POA: Diagnosis not present

## 2024-02-02 DIAGNOSIS — S32020D Wedge compression fracture of second lumbar vertebra, subsequent encounter for fracture with routine healing: Secondary | ICD-10-CM | POA: Diagnosis not present

## 2024-02-02 DIAGNOSIS — I214 Non-ST elevation (NSTEMI) myocardial infarction: Secondary | ICD-10-CM | POA: Diagnosis not present

## 2024-02-02 DIAGNOSIS — N1831 Chronic kidney disease, stage 3a: Secondary | ICD-10-CM | POA: Diagnosis not present

## 2024-02-09 DIAGNOSIS — F0390 Unspecified dementia without behavioral disturbance: Secondary | ICD-10-CM | POA: Diagnosis not present

## 2024-02-09 DIAGNOSIS — M810 Age-related osteoporosis without current pathological fracture: Secondary | ICD-10-CM | POA: Diagnosis not present

## 2024-02-09 DIAGNOSIS — D631 Anemia in chronic kidney disease: Secondary | ICD-10-CM | POA: Diagnosis not present

## 2024-02-09 DIAGNOSIS — N1831 Chronic kidney disease, stage 3a: Secondary | ICD-10-CM | POA: Diagnosis not present

## 2024-02-09 DIAGNOSIS — I214 Non-ST elevation (NSTEMI) myocardial infarction: Secondary | ICD-10-CM | POA: Diagnosis not present

## 2024-02-09 DIAGNOSIS — S32020D Wedge compression fracture of second lumbar vertebra, subsequent encounter for fracture with routine healing: Secondary | ICD-10-CM | POA: Diagnosis not present

## 2024-02-10 ENCOUNTER — Other Ambulatory Visit (INDEPENDENT_AMBULATORY_CARE_PROVIDER_SITE_OTHER): Payer: Self-pay

## 2024-02-10 ENCOUNTER — Ambulatory Visit: Payer: Self-pay

## 2024-02-10 ENCOUNTER — Ambulatory Visit: Payer: Self-pay | Admitting: Family Medicine

## 2024-02-10 DIAGNOSIS — F0390 Unspecified dementia without behavioral disturbance: Secondary | ICD-10-CM

## 2024-02-10 DIAGNOSIS — M810 Age-related osteoporosis without current pathological fracture: Secondary | ICD-10-CM | POA: Diagnosis not present

## 2024-02-10 DIAGNOSIS — D631 Anemia in chronic kidney disease: Secondary | ICD-10-CM | POA: Diagnosis not present

## 2024-02-10 DIAGNOSIS — N390 Urinary tract infection, site not specified: Secondary | ICD-10-CM

## 2024-02-10 DIAGNOSIS — S32020D Wedge compression fracture of second lumbar vertebra, subsequent encounter for fracture with routine healing: Secondary | ICD-10-CM | POA: Diagnosis not present

## 2024-02-10 DIAGNOSIS — N1831 Chronic kidney disease, stage 3a: Secondary | ICD-10-CM | POA: Diagnosis not present

## 2024-02-10 DIAGNOSIS — I214 Non-ST elevation (NSTEMI) myocardial infarction: Secondary | ICD-10-CM | POA: Diagnosis not present

## 2024-02-10 LAB — POCT URINALYSIS DIP (CLINITEK)
Bilirubin, UA: NEGATIVE
Blood, UA: NEGATIVE
Glucose, UA: NEGATIVE mg/dL
Ketones, POC UA: NEGATIVE mg/dL
Leukocytes, UA: NEGATIVE
Nitrite, UA: NEGATIVE
POC PROTEIN,UA: 30 — AB
Spec Grav, UA: 1.015 (ref 1.010–1.025)
Urobilinogen, UA: 0.2 U/dL
pH, UA: 6 (ref 5.0–8.0)

## 2024-02-10 NOTE — Telephone Encounter (Signed)
 FYI Only or Action Required?: Action required by provider: would like verbal order for u/a c&S to lab core.  [address] in [place], phone number is [phone].  Please let patient know so she can take the urine sample there.  Home health nurse is calling.   Patient was last seen in primary care on 01/05/2024 by Cook, Jayce G, DO.  Called Nurse Triage reporting Urinary Frequency.  Symptoms began today.  Interventions attempted: Nothing.  Symptoms are: gradually worsening.  Triage Disposition: No disposition on file.  Patient/caregiver understands and will follow disposition?:  Initial Assessment Questions 1. SEVERITY: How bad is the pain? (e.g., Scale 1-10; mild, moderate, or severe) - MILD (1-3): Complains slightly about urination hurting. - MODERATE (4-7): Interferes with normal activities. - SEVERE (8-10): Excruciating, unwilling or unable to urinate because of the pain. Mild 2. FREQUENCY: How many times have you had painful urination today? Foul odor, frequency and burning  3. PATTERN: Is pain present every time you urinate or just sometimes? Every time she goes 4. ONSET: When did the painful urination start? Unknown 5. FEVER: Do you have a fever? If Yes, ask: What is your temperature, how was it measured, and when did it start? no 6. PAST UTI: Have you had a urine infection before? If Yes, ask: When was the last time? and What happened that time? yes 7. CAUSE: What do you think is causing the painful urination? (e.g., UTI, scratch, Herpes sore) Uti 8. OTHER SYMPTOMS: Do you have any other symptoms? (e.g., blood in urine, flank pain, genital sores, urgency, vaginal discharge) 9. PREGNANCY: Is there any chance you are pregnant? When was your last menstrual period?    Copied from CRM [id number]. Topic: Clinical - Red Word Triage >> Feb 10, 2024 11:07 AM Edsel HERO wrote: uti

## 2024-02-10 NOTE — Telephone Encounter (Signed)
 Spoke with pt's husband and informed him per Dr Freda verbal orders . Will drop sample off this afternoon upon urine collection for urine dip and spin and culture.

## 2024-02-11 DIAGNOSIS — N390 Urinary tract infection, site not specified: Secondary | ICD-10-CM | POA: Diagnosis not present

## 2024-02-11 DIAGNOSIS — N1831 Chronic kidney disease, stage 3a: Secondary | ICD-10-CM | POA: Diagnosis not present

## 2024-02-11 DIAGNOSIS — F0390 Unspecified dementia without behavioral disturbance: Secondary | ICD-10-CM | POA: Diagnosis not present

## 2024-02-11 DIAGNOSIS — M810 Age-related osteoporosis without current pathological fracture: Secondary | ICD-10-CM | POA: Diagnosis not present

## 2024-02-11 DIAGNOSIS — S32020D Wedge compression fracture of second lumbar vertebra, subsequent encounter for fracture with routine healing: Secondary | ICD-10-CM | POA: Diagnosis not present

## 2024-02-11 DIAGNOSIS — D631 Anemia in chronic kidney disease: Secondary | ICD-10-CM | POA: Diagnosis not present

## 2024-02-11 DIAGNOSIS — I214 Non-ST elevation (NSTEMI) myocardial infarction: Secondary | ICD-10-CM | POA: Diagnosis not present

## 2024-02-12 DIAGNOSIS — Z87891 Personal history of nicotine dependence: Secondary | ICD-10-CM | POA: Diagnosis not present

## 2024-02-12 DIAGNOSIS — N390 Urinary tract infection, site not specified: Secondary | ICD-10-CM | POA: Diagnosis not present

## 2024-02-12 DIAGNOSIS — N1831 Chronic kidney disease, stage 3a: Secondary | ICD-10-CM | POA: Diagnosis not present

## 2024-02-12 DIAGNOSIS — Z556 Problems related to health literacy: Secondary | ICD-10-CM | POA: Diagnosis not present

## 2024-02-12 DIAGNOSIS — D631 Anemia in chronic kidney disease: Secondary | ICD-10-CM | POA: Diagnosis not present

## 2024-02-12 DIAGNOSIS — I214 Non-ST elevation (NSTEMI) myocardial infarction: Secondary | ICD-10-CM | POA: Diagnosis not present

## 2024-02-12 DIAGNOSIS — F0390 Unspecified dementia without behavioral disturbance: Secondary | ICD-10-CM | POA: Diagnosis not present

## 2024-02-12 DIAGNOSIS — M810 Age-related osteoporosis without current pathological fracture: Secondary | ICD-10-CM | POA: Diagnosis not present

## 2024-02-12 DIAGNOSIS — Z604 Social exclusion and rejection: Secondary | ICD-10-CM | POA: Diagnosis not present

## 2024-02-12 DIAGNOSIS — Z7982 Long term (current) use of aspirin: Secondary | ICD-10-CM | POA: Diagnosis not present

## 2024-02-12 DIAGNOSIS — S32020D Wedge compression fracture of second lumbar vertebra, subsequent encounter for fracture with routine healing: Secondary | ICD-10-CM | POA: Diagnosis not present

## 2024-02-13 LAB — URINE CULTURE

## 2024-02-13 LAB — SPECIMEN STATUS REPORT

## 2024-02-16 ENCOUNTER — Other Ambulatory Visit: Payer: Self-pay | Admitting: Cardiovascular Disease

## 2024-02-17 DIAGNOSIS — F0390 Unspecified dementia without behavioral disturbance: Secondary | ICD-10-CM | POA: Diagnosis not present

## 2024-02-17 DIAGNOSIS — N1831 Chronic kidney disease, stage 3a: Secondary | ICD-10-CM | POA: Diagnosis not present

## 2024-02-17 DIAGNOSIS — I214 Non-ST elevation (NSTEMI) myocardial infarction: Secondary | ICD-10-CM | POA: Diagnosis not present

## 2024-02-17 DIAGNOSIS — M810 Age-related osteoporosis without current pathological fracture: Secondary | ICD-10-CM | POA: Diagnosis not present

## 2024-02-17 DIAGNOSIS — D631 Anemia in chronic kidney disease: Secondary | ICD-10-CM | POA: Diagnosis not present

## 2024-02-17 DIAGNOSIS — S32020D Wedge compression fracture of second lumbar vertebra, subsequent encounter for fracture with routine healing: Secondary | ICD-10-CM | POA: Diagnosis not present

## 2024-02-19 DIAGNOSIS — F0390 Unspecified dementia without behavioral disturbance: Secondary | ICD-10-CM | POA: Diagnosis not present

## 2024-02-19 DIAGNOSIS — D631 Anemia in chronic kidney disease: Secondary | ICD-10-CM | POA: Diagnosis not present

## 2024-02-19 DIAGNOSIS — N1831 Chronic kidney disease, stage 3a: Secondary | ICD-10-CM | POA: Diagnosis not present

## 2024-02-19 DIAGNOSIS — I214 Non-ST elevation (NSTEMI) myocardial infarction: Secondary | ICD-10-CM | POA: Diagnosis not present

## 2024-02-19 DIAGNOSIS — S32020D Wedge compression fracture of second lumbar vertebra, subsequent encounter for fracture with routine healing: Secondary | ICD-10-CM | POA: Diagnosis not present

## 2024-02-19 DIAGNOSIS — M810 Age-related osteoporosis without current pathological fracture: Secondary | ICD-10-CM | POA: Diagnosis not present

## 2024-03-04 DIAGNOSIS — D631 Anemia in chronic kidney disease: Secondary | ICD-10-CM | POA: Diagnosis not present

## 2024-03-04 DIAGNOSIS — I214 Non-ST elevation (NSTEMI) myocardial infarction: Secondary | ICD-10-CM | POA: Diagnosis not present

## 2024-03-04 DIAGNOSIS — N1831 Chronic kidney disease, stage 3a: Secondary | ICD-10-CM | POA: Diagnosis not present

## 2024-03-04 DIAGNOSIS — F0390 Unspecified dementia without behavioral disturbance: Secondary | ICD-10-CM | POA: Diagnosis not present

## 2024-03-04 DIAGNOSIS — M810 Age-related osteoporosis without current pathological fracture: Secondary | ICD-10-CM | POA: Diagnosis not present

## 2024-03-04 DIAGNOSIS — S32020D Wedge compression fracture of second lumbar vertebra, subsequent encounter for fracture with routine healing: Secondary | ICD-10-CM | POA: Diagnosis not present

## 2024-03-08 DIAGNOSIS — M810 Age-related osteoporosis without current pathological fracture: Secondary | ICD-10-CM | POA: Diagnosis not present

## 2024-03-08 DIAGNOSIS — I214 Non-ST elevation (NSTEMI) myocardial infarction: Secondary | ICD-10-CM | POA: Diagnosis not present

## 2024-03-08 DIAGNOSIS — S32020D Wedge compression fracture of second lumbar vertebra, subsequent encounter for fracture with routine healing: Secondary | ICD-10-CM | POA: Diagnosis not present

## 2024-03-08 DIAGNOSIS — F0390 Unspecified dementia without behavioral disturbance: Secondary | ICD-10-CM | POA: Diagnosis not present

## 2024-03-08 DIAGNOSIS — N1831 Chronic kidney disease, stage 3a: Secondary | ICD-10-CM | POA: Diagnosis not present

## 2024-03-08 DIAGNOSIS — D631 Anemia in chronic kidney disease: Secondary | ICD-10-CM | POA: Diagnosis not present

## 2024-03-13 DIAGNOSIS — Z556 Problems related to health literacy: Secondary | ICD-10-CM | POA: Diagnosis not present

## 2024-03-13 DIAGNOSIS — D509 Iron deficiency anemia, unspecified: Secondary | ICD-10-CM | POA: Diagnosis not present

## 2024-03-13 DIAGNOSIS — I252 Old myocardial infarction: Secondary | ICD-10-CM | POA: Diagnosis not present

## 2024-03-13 DIAGNOSIS — Z87440 Personal history of urinary (tract) infections: Secondary | ICD-10-CM | POA: Diagnosis not present

## 2024-03-13 DIAGNOSIS — Z7982 Long term (current) use of aspirin: Secondary | ICD-10-CM | POA: Diagnosis not present

## 2024-03-13 DIAGNOSIS — M810 Age-related osteoporosis without current pathological fracture: Secondary | ICD-10-CM | POA: Diagnosis not present

## 2024-03-13 DIAGNOSIS — Z87891 Personal history of nicotine dependence: Secondary | ICD-10-CM | POA: Diagnosis not present

## 2024-03-13 DIAGNOSIS — Z604 Social exclusion and rejection: Secondary | ICD-10-CM | POA: Diagnosis not present

## 2024-03-13 DIAGNOSIS — D631 Anemia in chronic kidney disease: Secondary | ICD-10-CM | POA: Diagnosis not present

## 2024-03-13 DIAGNOSIS — N1831 Chronic kidney disease, stage 3a: Secondary | ICD-10-CM | POA: Diagnosis not present

## 2024-03-13 DIAGNOSIS — S32020D Wedge compression fracture of second lumbar vertebra, subsequent encounter for fracture with routine healing: Secondary | ICD-10-CM | POA: Diagnosis not present

## 2024-03-13 DIAGNOSIS — F0390 Unspecified dementia without behavioral disturbance: Secondary | ICD-10-CM | POA: Diagnosis not present

## 2024-03-14 ENCOUNTER — Telehealth: Payer: Self-pay

## 2024-03-14 NOTE — Telephone Encounter (Signed)
 Error

## 2024-03-15 DIAGNOSIS — D631 Anemia in chronic kidney disease: Secondary | ICD-10-CM | POA: Diagnosis not present

## 2024-03-15 DIAGNOSIS — F0390 Unspecified dementia without behavioral disturbance: Secondary | ICD-10-CM | POA: Diagnosis not present

## 2024-03-15 DIAGNOSIS — D509 Iron deficiency anemia, unspecified: Secondary | ICD-10-CM | POA: Diagnosis not present

## 2024-03-15 DIAGNOSIS — N1831 Chronic kidney disease, stage 3a: Secondary | ICD-10-CM | POA: Diagnosis not present

## 2024-03-15 DIAGNOSIS — M810 Age-related osteoporosis without current pathological fracture: Secondary | ICD-10-CM | POA: Diagnosis not present

## 2024-03-15 DIAGNOSIS — I252 Old myocardial infarction: Secondary | ICD-10-CM | POA: Diagnosis not present

## 2024-03-16 DIAGNOSIS — D631 Anemia in chronic kidney disease: Secondary | ICD-10-CM | POA: Diagnosis not present

## 2024-03-16 DIAGNOSIS — I252 Old myocardial infarction: Secondary | ICD-10-CM | POA: Diagnosis not present

## 2024-03-16 DIAGNOSIS — M810 Age-related osteoporosis without current pathological fracture: Secondary | ICD-10-CM | POA: Diagnosis not present

## 2024-03-16 DIAGNOSIS — F0390 Unspecified dementia without behavioral disturbance: Secondary | ICD-10-CM | POA: Diagnosis not present

## 2024-03-16 DIAGNOSIS — N1831 Chronic kidney disease, stage 3a: Secondary | ICD-10-CM | POA: Diagnosis not present

## 2024-03-16 DIAGNOSIS — D509 Iron deficiency anemia, unspecified: Secondary | ICD-10-CM | POA: Diagnosis not present

## 2024-03-23 DIAGNOSIS — M810 Age-related osteoporosis without current pathological fracture: Secondary | ICD-10-CM | POA: Diagnosis not present

## 2024-03-23 DIAGNOSIS — Z87440 Personal history of urinary (tract) infections: Secondary | ICD-10-CM | POA: Diagnosis not present

## 2024-03-23 DIAGNOSIS — Z556 Problems related to health literacy: Secondary | ICD-10-CM | POA: Diagnosis not present

## 2024-03-23 DIAGNOSIS — I252 Old myocardial infarction: Secondary | ICD-10-CM | POA: Diagnosis not present

## 2024-03-23 DIAGNOSIS — Z7982 Long term (current) use of aspirin: Secondary | ICD-10-CM | POA: Diagnosis not present

## 2024-03-23 DIAGNOSIS — N1831 Chronic kidney disease, stage 3a: Secondary | ICD-10-CM | POA: Diagnosis not present

## 2024-03-23 DIAGNOSIS — Z604 Social exclusion and rejection: Secondary | ICD-10-CM | POA: Diagnosis not present

## 2024-03-23 DIAGNOSIS — F0390 Unspecified dementia without behavioral disturbance: Secondary | ICD-10-CM | POA: Diagnosis not present

## 2024-03-23 DIAGNOSIS — D509 Iron deficiency anemia, unspecified: Secondary | ICD-10-CM | POA: Diagnosis not present

## 2024-03-23 DIAGNOSIS — S32020D Wedge compression fracture of second lumbar vertebra, subsequent encounter for fracture with routine healing: Secondary | ICD-10-CM | POA: Diagnosis not present

## 2024-03-23 DIAGNOSIS — D631 Anemia in chronic kidney disease: Secondary | ICD-10-CM | POA: Diagnosis not present

## 2024-03-23 DIAGNOSIS — Z87891 Personal history of nicotine dependence: Secondary | ICD-10-CM | POA: Diagnosis not present

## 2024-03-30 ENCOUNTER — Encounter: Payer: Self-pay | Admitting: Family Medicine

## 2024-03-30 ENCOUNTER — Ambulatory Visit (HOSPITAL_COMMUNITY)
Admission: RE | Admit: 2024-03-30 | Discharge: 2024-03-30 | Disposition: A | Discharge status: Home / Self Care | Source: Ambulatory Visit | Attending: Family Medicine | Admitting: Family Medicine

## 2024-03-30 ENCOUNTER — Ambulatory Visit: Payer: Self-pay

## 2024-03-30 ENCOUNTER — Ambulatory Visit (INDEPENDENT_AMBULATORY_CARE_PROVIDER_SITE_OTHER): Admitting: Family Medicine

## 2024-03-30 ENCOUNTER — Ambulatory Visit: Payer: Self-pay | Admitting: Family Medicine

## 2024-03-30 VITALS — BP 126/81 | HR 80 | Temp 97.3°F | Ht 64.0 in | Wt 105.0 lb

## 2024-03-30 DIAGNOSIS — M810 Age-related osteoporosis without current pathological fracture: Secondary | ICD-10-CM | POA: Diagnosis not present

## 2024-03-30 DIAGNOSIS — D509 Iron deficiency anemia, unspecified: Secondary | ICD-10-CM | POA: Diagnosis not present

## 2024-03-30 DIAGNOSIS — D631 Anemia in chronic kidney disease: Secondary | ICD-10-CM | POA: Diagnosis not present

## 2024-03-30 DIAGNOSIS — F0390 Unspecified dementia without behavioral disturbance: Secondary | ICD-10-CM | POA: Diagnosis not present

## 2024-03-30 DIAGNOSIS — K449 Diaphragmatic hernia without obstruction or gangrene: Secondary | ICD-10-CM | POA: Diagnosis not present

## 2024-03-30 DIAGNOSIS — R0781 Pleurodynia: Secondary | ICD-10-CM | POA: Insufficient documentation

## 2024-03-30 DIAGNOSIS — I252 Old myocardial infarction: Secondary | ICD-10-CM | POA: Diagnosis not present

## 2024-03-30 DIAGNOSIS — N1831 Chronic kidney disease, stage 3a: Secondary | ICD-10-CM | POA: Diagnosis not present

## 2024-03-30 NOTE — Telephone Encounter (Signed)
 FYI Only or Action Required?: FYI only for provider.  Patient was last seen in primary care on 01/05/2024 by Cook, Jayce G, DO.  Called Nurse Triage reporting Fall.  Symptoms began several days ago.  Interventions attempted: Nothing.  Symptoms are: hypertension (154/104, higher than her baseline and did not miss her BP med today), unwitnessed fall on Saturday found lying on her left side, winced when her husband was touching her left arm and home health RN states it seems painful when she took a deep breath stable.  Triage Disposition: See PCP When Office is Open (Within 3 Days)  Patient/caregiver understands and will follow disposition?: Yes         Copied from CRM #[id number]. Topic: Clinical - Red Word Triage >> Mar 30, 2024 12:32 PM Marissa P wrote: Red Word that prompted transfer to Nurse Triage: Waddell from [facility] health called, pt had a fall staturday evening, some pain and no bruising visible. Blood pressure is outside of her norm, didn't report signs or symptoms but does have a dementia diagnosis but just feels more tired than normal. Would like to see if she should be seen or not Reason for Disposition  Systolic BP >= 160 OR Diastolic >= 100  Answer Assessment - Initial Assessment Questions 1. MECHANISM: How did the fall happen?     Unsure. Husband was in another room, heard her fall and he states within about 30 seconds he was able to get to her. He thinks she may have gotten up without using her rolling walker.  2. DOMESTIC VIOLENCE AND ELDER ABUSE SCREENING: Did you fall because someone pushed you or tried to hurt you? If Yes, ask: Are you safe now?     Unable to ask patient due to she has dementia and not on phone for triage.  3. ONSET: When did the fall happen? (e.g., minutes, hours, or days ago)     Saturday evening.  4. LOCATION: What part of the body hit the ground? (e.g., back, buttocks, head, hips, knees, hands, head, stomach)     Patient was  found lying on her left side.  5. INJURY: Did you hurt (injure) yourself when you fell? If Yes, ask: What did you injure? Tell me more about this? (e.g., body area; type of injury; pain severity)     Patient does not complain of any injuries or pain, but difficult to assess due to she has dementia.  6. PAIN: Is there any pain? If Yes, ask: How bad is the pain? (e.g., Scale 0-10; or none, mild,      Patient denies pain but home health states patient was wincing when husband touched her left arm to assist with transferring and when asked to take a deep breath she seemed to wince.  7. SIZE: For cuts, bruises, or swelling, ask: How large is it? (e.g., inches or centimeters)      No visible cuts, bruising or swelling.  8. PREGNANCY: Is there any chance you are pregnant? When was your last menstrual period?     N/A.  9. OTHER SYMPTOMS: Do you have any other symptoms? (e.g., dizziness, fever, weakness; new-onset or worsening).      Fatigue; BP was 158/104 about 5-10 minutes ago when checked manually by home health. Denies chest pain, blurred vision, headache, SOB. Home health reports respirations are 16 and no congestion heard in lungs on assessment. Husband denies any LOC when he found patient on the floor.  10. CAUSE: What do you think caused  the fall (or falling)? (e.g., dizzy spell, tripped)       Unsure but husband and home health think she tripped or got up with out her walker.  Protocols used: Falls and Falling-A-AH, Blood Pressure - High-A-AH

## 2024-03-30 NOTE — Patient Instructions (Signed)
 Xray at the hospital  We will call with the results.

## 2024-03-30 NOTE — Telephone Encounter (Signed)
 noted

## 2024-03-31 DIAGNOSIS — R0781 Pleurodynia: Secondary | ICD-10-CM | POA: Insufficient documentation

## 2024-03-31 NOTE — Assessment & Plan Note (Signed)
 X-ray was obtained.  X-ray concerning for left 12th rib fracture.  Advise supportive care.  Pain medication if needed.  Husband states that Tylenol  was working well at this time.

## 2024-03-31 NOTE — Progress Notes (Signed)
 Subjective:  Patient ID: Jeanne Rogers, female    DOB: 1940/12/29  Age: 83 y.o. MRN: [id number]  CC:   Chief Complaint  Patient presents with   fall on nssaturday    Lef side / rib area pain reported today    HPI:  83 year old female presents for evaluation of the above.  Had an unwitnessed fall on Saturday. Patient unsure of how she fell. Landed on her right side. Reports rib pain. No head injury. No bruising. No relieving factors.   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   Compression fracture of L2 lumbar vertebra (HCC) 10/22/2023   Iron  deficiency anemia 08/07/2022   Actinic keratosis 08/07/2022   Stage 3a chronic kidney disease (HCC) 03/27/2022   History of non-ST elevation myocardial infarction (NSTEMI) 03/26/2022   Takotsubo cardiomyopathy    Dementia (HCC) 10/11/2019   Hyperlipidemia 08/26/2019   Osteoporosis 06/29/2013    Social Hx   Social History   Socioeconomic History   Marital status: Married    Spouse name: Not on file   Number of children: Not on file   Years of education: Not on file   Highest education level: Master's degree (e.g., MA, MS, MEng, MEd, MSW, MBA)  Occupational History   Not on file  Tobacco Use   Smoking status: Former    Current packs/day: 0.00    Types: Cigarettes    Quit date: 10/02/1988    Years since quitting: 35.5   Smokeless tobacco: Never  Vaping Use   Vaping status: Never Used  Substance and Sexual Activity   Alcohol use: No   Drug use: No   Sexual activity: Not Currently  Other Topics Concern   Not on file  Social History Narrative   Not on file   Social Drivers of Health   Financial Resource Strain: Low Risk  (10/20/2023)   Overall Financial Resource Strain (CARDIA)    Difficulty of Paying Living Expenses: Not hard at all  Food Insecurity: No Food Insecurity (10/20/2023)   Hunger Vital Sign    Worried About Running Out of Food in the  Last Year: Never true    Ran Out of Food in the Last Year: Never true  Transportation Needs: No Transportation Needs (10/20/2023)   PRAPARE - Administrator, Civil Service (Medical): No    Lack of Transportation (Non-Medical): No  Physical Activity: Unknown (10/20/2023)   Exercise Vital Sign    Days of Exercise per Week: 0 days    Minutes of Exercise per Session: Not on file  Stress: No Stress Concern Present (10/20/2023)   [employer] of Occupational Health - Occupational Stress Questionnaire    Feeling of Stress : Only a little  Social Connections: Socially Integrated (10/20/2023)   Social Connection and Isolation Panel    Frequency of Communication with Friends and Family: Twice a week    Frequency of Social Gatherings with Friends and Family: Three times a week    Attends Religious Services: 1 to 4 times per year    Active Member of Clubs or Organizations: Yes    Attends Banker Meetings: 1 to 4 times per year    Marital Status: Married    Review of Systems Per HPI  Objective:  BP 126/81   Pulse 80   Temp (!) 97.3 F (36.3 C)   Ht 5' 4 (1.626 m)   Wt  105 lb (47.6 kg)   SpO2 95%   BMI 18.02 kg/m      03/30/2024    3:00 PM 03/30/2024    2:28 PM 01/19/2024   10:33 AM  BP/Weight  Systolic BP 126 160 118  Diastolic BP 81 83 72  Wt. (Lbs)  105 108.4  BMI  18.02 kg/m2 18.61 kg/m2    Physical Exam Vitals and nursing note reviewed.  Constitutional:      General: She is not in acute distress.    Appearance: Normal appearance.  HENT:     Head: Normocephalic and atraumatic.  Cardiovascular:     Rate and Rhythm: Normal rate and regular rhythm.  Pulmonary:     Effort: Pulmonary effort is normal.     Breath sounds: Normal breath sounds.  Musculoskeletal:     Comments: Left lower ribs with tenderness to palpation.  Neurological:     Mental Status: She is alert.     Lab Results  Component Value Date   WBC 8.7 10/21/2023   HGB 14.0  10/21/2023   HCT 40.7 10/21/2023   PLT 290 10/21/2023   GLUCOSE 120 (H) 10/21/2023   CHOL 242 (H) 09/25/2021   TRIG 131 09/25/2021   HDL 76 09/25/2021   LDLCALC 143 (H) 09/25/2021   ALT 18 10/21/2023   AST 19 10/21/2023   NA 143 10/21/2023   K 3.6 10/21/2023   CL 102 10/21/2023   CREATININE 0.87 10/21/2023   BUN 23 10/21/2023   CO2 24 10/21/2023   TSH 2.521 08/25/2019   INR 1.0 08/24/2019   HGBA1C 5.4 05/10/2020     Assessment & Plan:  Rib pain on left side Assessment & Plan: X-ray was obtained.  X-ray concerning for left 12th rib fracture.  Advise supportive care.  Pain medication if needed.  Husband states that Tylenol  was working well at this time.  Orders: -     DG Ribs Unilateral Left    Follow-up:  Return if symptoms worsen or fail to improve.  Jacqulyn Ahle DO [facility]

## 2024-04-06 ENCOUNTER — Ambulatory Visit: Payer: Self-pay

## 2024-04-06 DIAGNOSIS — M810 Age-related osteoporosis without current pathological fracture: Secondary | ICD-10-CM | POA: Diagnosis not present

## 2024-04-06 DIAGNOSIS — F0390 Unspecified dementia without behavioral disturbance: Secondary | ICD-10-CM | POA: Diagnosis not present

## 2024-04-06 DIAGNOSIS — I252 Old myocardial infarction: Secondary | ICD-10-CM | POA: Diagnosis not present

## 2024-04-06 DIAGNOSIS — N1831 Chronic kidney disease, stage 3a: Secondary | ICD-10-CM | POA: Diagnosis not present

## 2024-04-06 DIAGNOSIS — D509 Iron deficiency anemia, unspecified: Secondary | ICD-10-CM | POA: Diagnosis not present

## 2024-04-06 DIAGNOSIS — D631 Anemia in chronic kidney disease: Secondary | ICD-10-CM | POA: Diagnosis not present

## 2024-04-06 NOTE — Telephone Encounter (Signed)
 FYI Only or Action Required?: FYI only for provider.  Patient was last seen in primary care on 03/30/2024 by Cook, Jayce G, DO.  Called Nurse Triage reporting Hypertension.  Symptoms began today.  Interventions attempted: Nothing.  Symptoms are: unchanged.  Triage Disposition: See PCP When Office is Open (Within 3 Days)  Patient/caregiver understands and will follow disposition?: Yes         Copied from CRM #[id number]. Topic: Clinical - Red Word Triage >> Apr 06, 2024 11:30 AM Tinnie BROCKS wrote: Red Word that prompted transfer to Nurse Triage: 130/102 left arm bp, vomiting, not eating, clammy, sore all over. Home health nurse on the line.        Reason for Disposition  Systolic BP >= 160 OR Diastolic >= 100  Answer Assessment - Initial Assessment Questions 1. BLOOD PRESSURE: What is your blood pressure? Did you take at least two measurements 5 minutes apart?     130/102 2. ONSET: When did you take your blood pressure?     Today  3. HOW: How did you take your blood pressure? (e.g., automatic home BP monitor, visiting nurse)     Home Health  4. HISTORY: Do you have a history of high blood pressure?     No 5. MEDICINES: Are you taking any medicines for blood pressure? Have you missed any doses recently?     No 6. OTHER SYMPTOMS: Do you have any symptoms? (e.g., blurred vision, chest pain, difficulty breathing, headache, weakness)     Loss of appetite  Protocols used: Blood Pressure - High-A-AH

## 2024-04-07 ENCOUNTER — Inpatient Hospital Stay (HOSPITAL_COMMUNITY)
Admission: EM | Admit: 2024-04-07 | Discharge: 2024-05-04 | DRG: 389 | Disposition: E | Attending: Family Medicine | Admitting: Family Medicine

## 2024-04-07 ENCOUNTER — Emergency Department (HOSPITAL_COMMUNITY)

## 2024-04-07 ENCOUNTER — Ambulatory Visit: Payer: Self-pay | Admitting: Family Medicine

## 2024-04-07 DIAGNOSIS — E872 Acidosis, unspecified: Secondary | ICD-10-CM | POA: Diagnosis present

## 2024-04-07 DIAGNOSIS — Z87891 Personal history of nicotine dependence: Secondary | ICD-10-CM | POA: Diagnosis not present

## 2024-04-07 DIAGNOSIS — R4182 Altered mental status, unspecified: Secondary | ICD-10-CM | POA: Diagnosis not present

## 2024-04-07 DIAGNOSIS — R29890 Loss of height: Secondary | ICD-10-CM | POA: Diagnosis not present

## 2024-04-07 DIAGNOSIS — K449 Diaphragmatic hernia without obstruction or gangrene: Secondary | ICD-10-CM | POA: Diagnosis present

## 2024-04-07 DIAGNOSIS — Z90722 Acquired absence of ovaries, bilateral: Secondary | ICD-10-CM

## 2024-04-07 DIAGNOSIS — R9082 White matter disease, unspecified: Secondary | ICD-10-CM | POA: Diagnosis not present

## 2024-04-07 DIAGNOSIS — K8070 Calculus of gallbladder and bile duct without cholecystitis without obstruction: Secondary | ICD-10-CM | POA: Diagnosis not present

## 2024-04-07 DIAGNOSIS — K559 Vascular disorder of intestine, unspecified: Secondary | ICD-10-CM | POA: Diagnosis present

## 2024-04-07 DIAGNOSIS — Z515 Encounter for palliative care: Secondary | ICD-10-CM | POA: Diagnosis not present

## 2024-04-07 DIAGNOSIS — Z888 Allergy status to other drugs, medicaments and biological substances status: Secondary | ICD-10-CM | POA: Diagnosis not present

## 2024-04-07 DIAGNOSIS — Z66 Do not resuscitate: Secondary | ICD-10-CM | POA: Diagnosis present

## 2024-04-07 DIAGNOSIS — R531 Weakness: Secondary | ICD-10-CM | POA: Diagnosis not present

## 2024-04-07 DIAGNOSIS — Z90710 Acquired absence of both cervix and uterus: Secondary | ICD-10-CM

## 2024-04-07 DIAGNOSIS — K920 Hematemesis: Secondary | ICD-10-CM | POA: Diagnosis not present

## 2024-04-07 DIAGNOSIS — G9349 Other encephalopathy: Secondary | ICD-10-CM | POA: Diagnosis present

## 2024-04-07 DIAGNOSIS — R0682 Tachypnea, not elsewhere classified: Secondary | ICD-10-CM | POA: Diagnosis not present

## 2024-04-07 DIAGNOSIS — N281 Cyst of kidney, acquired: Secondary | ICD-10-CM | POA: Diagnosis not present

## 2024-04-07 DIAGNOSIS — Z8249 Family history of ischemic heart disease and other diseases of the circulatory system: Secondary | ICD-10-CM

## 2024-04-07 DIAGNOSIS — R109 Unspecified abdominal pain: Secondary | ICD-10-CM | POA: Diagnosis not present

## 2024-04-07 DIAGNOSIS — Z79899 Other long term (current) drug therapy: Secondary | ICD-10-CM | POA: Diagnosis not present

## 2024-04-07 DIAGNOSIS — Z7982 Long term (current) use of aspirin: Secondary | ICD-10-CM

## 2024-04-07 DIAGNOSIS — N179 Acute kidney failure, unspecified: Secondary | ICD-10-CM | POA: Diagnosis present

## 2024-04-07 DIAGNOSIS — K56609 Unspecified intestinal obstruction, unspecified as to partial versus complete obstruction: Secondary | ICD-10-CM | POA: Diagnosis present

## 2024-04-07 DIAGNOSIS — E785 Hyperlipidemia, unspecified: Secondary | ICD-10-CM | POA: Diagnosis present

## 2024-04-07 DIAGNOSIS — K5730 Diverticulosis of large intestine without perforation or abscess without bleeding: Secondary | ICD-10-CM | POA: Diagnosis not present

## 2024-04-07 DIAGNOSIS — F0390 Unspecified dementia without behavioral disturbance: Secondary | ICD-10-CM | POA: Diagnosis present

## 2024-04-07 DIAGNOSIS — K219 Gastro-esophageal reflux disease without esophagitis: Secondary | ICD-10-CM | POA: Diagnosis present

## 2024-04-07 DIAGNOSIS — R000 Tachycardia, unspecified: Secondary | ICD-10-CM | POA: Diagnosis not present

## 2024-04-07 DIAGNOSIS — K922 Gastrointestinal hemorrhage, unspecified: Secondary | ICD-10-CM | POA: Diagnosis not present

## 2024-04-07 DIAGNOSIS — Z8711 Personal history of peptic ulcer disease: Secondary | ICD-10-CM | POA: Diagnosis not present

## 2024-04-07 DIAGNOSIS — I10 Essential (primary) hypertension: Secondary | ICD-10-CM | POA: Diagnosis not present

## 2024-04-07 DIAGNOSIS — M810 Age-related osteoporosis without current pathological fracture: Secondary | ICD-10-CM | POA: Diagnosis present

## 2024-04-07 DIAGNOSIS — K567 Ileus, unspecified: Secondary | ICD-10-CM | POA: Diagnosis not present

## 2024-04-07 DIAGNOSIS — Z83438 Family history of other disorder of lipoprotein metabolism and other lipidemia: Secondary | ICD-10-CM

## 2024-04-07 DIAGNOSIS — G9340 Encephalopathy, unspecified: Secondary | ICD-10-CM

## 2024-04-07 DIAGNOSIS — Z043 Encounter for examination and observation following other accident: Secondary | ICD-10-CM | POA: Diagnosis not present

## 2024-04-07 DIAGNOSIS — Z4682 Encounter for fitting and adjustment of non-vascular catheter: Secondary | ICD-10-CM | POA: Diagnosis not present

## 2024-04-07 DIAGNOSIS — M4856XA Collapsed vertebra, not elsewhere classified, lumbar region, initial encounter for fracture: Secondary | ICD-10-CM | POA: Diagnosis not present

## 2024-04-07 LAB — CBC WITH DIFFERENTIAL/PLATELET
Abs Immature Granulocytes: 0 K/uL (ref 0.00–0.07)
Basophils Absolute: 0 K/uL (ref 0.0–0.1)
Basophils Relative: 0 %
Eosinophils Absolute: 0 K/uL (ref 0.0–0.5)
Eosinophils Relative: 0 %
HCT: 47.3 % — ABNORMAL HIGH (ref 36.0–46.0)
Hemoglobin: 15.3 g/dL — ABNORMAL HIGH (ref 12.0–15.0)
Immature Granulocytes: 0 %
Lymphocytes Relative: 19 %
Lymphs Abs: 0.5 K/uL — ABNORMAL LOW (ref 0.7–4.0)
MCH: 31 pg (ref 26.0–34.0)
MCHC: 32.3 g/dL (ref 30.0–36.0)
MCV: 95.7 fL (ref 80.0–100.0)
Monocytes Absolute: 0.2 K/uL (ref 0.1–1.0)
Monocytes Relative: 7 %
Neutro Abs: 1.8 K/uL (ref 1.7–7.7)
Neutrophils Relative %: 74 %
Platelets: 503 K/uL — ABNORMAL HIGH (ref 150–400)
RBC: 4.94 MIL/uL (ref 3.87–5.11)
RDW: 14.8 % (ref 11.5–15.5)
Smear Review: NORMAL
WBC: 2.4 K/uL — ABNORMAL LOW (ref 4.0–10.5)
nRBC: 0 % (ref 0.0–0.2)

## 2024-04-07 LAB — TYPE AND SCREEN
ABO/RH(D): O NEG
Antibody Screen: NEGATIVE

## 2024-04-07 LAB — URINALYSIS, ROUTINE W REFLEX MICROSCOPIC
Bilirubin Urine: NEGATIVE
Glucose, UA: NEGATIVE mg/dL
Hgb urine dipstick: NEGATIVE
Ketones, ur: NEGATIVE mg/dL
Leukocytes,Ua: NEGATIVE
Nitrite: NEGATIVE
Protein, ur: 100 mg/dL — AB
Specific Gravity, Urine: 1.023 (ref 1.005–1.030)
pH: 5 (ref 5.0–8.0)

## 2024-04-07 LAB — COMPREHENSIVE METABOLIC PANEL WITH GFR
ALT: 22 U/L (ref 0–44)
AST: 45 U/L — ABNORMAL HIGH (ref 15–41)
Albumin: 3.9 g/dL (ref 3.5–5.0)
Alkaline Phosphatase: 74 U/L (ref 38–126)
Anion gap: 27 — ABNORMAL HIGH (ref 5–15)
BUN: 43 mg/dL — ABNORMAL HIGH (ref 8–23)
CO2: 18 mmol/L — ABNORMAL LOW (ref 22–32)
Calcium: 9.9 mg/dL (ref 8.9–10.3)
Chloride: 99 mmol/L (ref 98–111)
Creatinine, Ser: 2.41 mg/dL — ABNORMAL HIGH (ref 0.44–1.00)
GFR, Estimated: 20 mL/min — ABNORMAL LOW (ref >60)
Glucose, Bld: 200 mg/dL — ABNORMAL HIGH (ref 70–99)
Potassium: 4.1 mmol/L (ref 3.5–5.1)
Sodium: 144 mmol/L (ref 135–145)
Total Bilirubin: 0.9 mg/dL (ref 0.0–1.2)
Total Protein: 7.4 g/dL (ref 6.5–8.1)

## 2024-04-07 LAB — LIPASE, BLOOD: Lipase: 45 U/L (ref 11–51)

## 2024-04-07 LAB — LACTIC ACID, PLASMA
Lactic Acid, Venous: >9 mmol/L (ref 0.5–1.9)
Lactic Acid, Venous: >9 mmol/L (ref 0.5–1.9)
Lactic Acid, Venous: >9 mmol/L (ref 0.5–1.9)

## 2024-04-07 LAB — POC OCCULT BLOOD, ED: Negative: NEGATIVE

## 2024-04-07 LAB — PROTIME-INR
INR: 1.1 (ref 0.8–1.2)
Prothrombin Time: 14.9 s (ref 11.4–15.2)

## 2024-04-07 MED ORDER — HYDROMORPHONE HCL 1 MG/ML IJ SOLN: 0.5000 mg | INTRAMUSCULAR | Status: DC | PRN

## 2024-04-07 MED ORDER — GLYCOPYRROLATE 1 MG PO TABS: 1.0000 mg | ORAL_TABLET | ORAL | Status: DC | PRN

## 2024-04-07 MED ORDER — LACTATED RINGERS IV BOLUS
1000.0000 mL | Freq: Once | INTRAVENOUS | Status: AC
  Administered 2024-04-07: 1000 mL via INTRAVENOUS

## 2024-04-07 MED ORDER — PANTOPRAZOLE SODIUM 40 MG IV SOLR
40.0000 mg | INTRAVENOUS | Status: DC
  Filled 2024-04-07: qty 10

## 2024-04-07 MED ORDER — MORPHINE SULFATE (PF) 2 MG/ML IV SOLN
2.0000 mg | Freq: Once | INTRAVENOUS | Status: AC
  Administered 2024-04-07: 2 mg via INTRAVENOUS
  Filled 2024-04-07: qty 1

## 2024-04-07 MED ORDER — CHLORHEXIDINE GLUCONATE CLOTH 2 % EX PADS: 6.0000 | MEDICATED_PAD | Freq: Every day | CUTANEOUS | Status: DC

## 2024-04-07 MED ORDER — GLYCOPYRROLATE 0.2 MG/ML IJ SOLN: 0.2000 mg | INTRAMUSCULAR | Status: DC | PRN

## 2024-04-07 MED ORDER — PANTOPRAZOLE SODIUM 40 MG IV SOLR: 40.0000 mg | Freq: Two times a day (BID) | INTRAVENOUS | Status: DC

## 2024-04-07 MED ORDER — HYDROMORPHONE HCL 1 MG/ML IJ SOLN
INTRAMUSCULAR | Status: AC
  Filled 2024-04-07: qty 2

## 2024-04-07 MED ORDER — SODIUM CHLORIDE 0.9 % IV SOLN
2.2500 g | Freq: Three times a day (TID) | INTRAVENOUS | Status: DC
  Filled 2024-04-07 (×3): qty 10

## 2024-04-07 MED ORDER — PIPERACILLIN-TAZOBACTAM 3.375 G IVPB
3.3750 g | Freq: Once | INTRAVENOUS | Status: AC
  Administered 2024-04-07: 3.375 g via INTRAVENOUS
  Filled 2024-04-07: qty 50

## 2024-04-07 MED ORDER — PANTOPRAZOLE SODIUM 40 MG IV SOLR
40.0000 mg | Freq: Once | INTRAVENOUS | Status: AC
  Administered 2024-04-07: 40 mg via INTRAVENOUS

## 2024-04-07 MED ORDER — LORAZEPAM 2 MG/ML PO CONC: 1.0000 mg | ORAL | Status: DC | PRN

## 2024-04-07 MED ORDER — POLYVINYL ALCOHOL 1.4 % OP SOLN: 1.0000 [drp] | Freq: Four times a day (QID) | OPHTHALMIC | Status: DC | PRN

## 2024-04-07 MED ORDER — LORAZEPAM 1 MG PO TABS: 1.0000 mg | ORAL_TABLET | ORAL | Status: DC | PRN

## 2024-04-07 MED ORDER — LACTATED RINGERS IV SOLN: INTRAVENOUS | Status: DC

## 2024-04-07 MED ORDER — PANTOPRAZOLE SODIUM 40 MG IV SOLR: 40.0000 mg | Freq: Four times a day (QID) | INTRAVENOUS | Status: DC

## 2024-04-07 MED ORDER — BIOTENE DRY MOUTH MT LIQD
15.0000 mL | OROMUCOSAL | Status: DC | PRN
Start: 2024-04-07 — End: 2024-04-08

## 2024-04-07 MED ORDER — LORAZEPAM 2 MG/ML IJ SOLN: 1.0000 mg | INTRAMUSCULAR | Status: DC | PRN

## 2024-04-07 MED ORDER — MORPHINE SULFATE (PF) 2 MG/ML IV SOLN: 2.0000 mg | Freq: Once | INTRAVENOUS | Status: DC

## 2024-04-07 MED ORDER — SODIUM CHLORIDE 0.9 % IV BOLUS
1000.0000 mL | Freq: Once | INTRAVENOUS | Status: AC
  Administered 2024-04-07: 1000 mL via INTRAVENOUS

## 2024-05-04 NOTE — Progress Notes (Signed)
 Patient arrived to unit unresponsive. Upon assessment w/ Delon, RN and Schuyler, RN it was noted that pt eyes were fixed. NGT was coiled in pt mouth. Pt on 2L [place]. Pt agonal breathing. Dr. Rendall and palliative care team notified and came to bedside. See new orders. PRN meds given for comfort. Family at bedside.     06-May-2024 1445  Charting Type  Charting Type Admission/Transfer Assessment  Keller Work Intensity Score (Update with each assessment and as needed)  Work Intensity Score (Level) 3  Neurological  Neuro (WDL) X  Level of Consciousness Unresponsive  Orientation Level Disoriented X4 (unresponsive)  Cognition Unable to follow commands  Speech Other (Comment) (not speaking)  R Pupil Size (mm) Other (Comment) (pupil fixed)  R Pupil Shape Round  R Pupil Reaction Nonreactive  L Pupil Size (mm) Other (Comment) (pupil fixed)  L Pupil Shape Round  L Pupil Reaction Nonreactive  Glasgow Coma Scale  Eye Opening 1  Best Verbal Response (NON-intubated) 1  Best Motor Response 1  Glasgow Coma Scale Score 3  Richmond Agitation Sedation Scale  Richmond Agitation Sedation Scale (RASS) -5  RASS Goal -5  Intensive Care Delirium Screening Checklist (ICDSC)  Altered level of consciousness 0.0  Inattention 1  Disorientation 1  Hallucinations, Delusion, or Psychosis 0  Psychomotor agitation or retardation 0  Inappropriate speech or mood 1  Sleep/wake cycle disturbance 1  Symptom fluctuation 1  ICDSC Score 5  ICDSC - Delirium Prevention:  Universal Requirements (Complete for ICU patients)  Universal Precautions Initiated *See Row Information* Yes  ICDSC - Interventions for patient with ICDSC equal to or greater than 4 (Complete for ICU patients)  ICDSC Positive:  Interventions Continue Universal (preventative) measures  Oral Assessment (Complete on admission/transfer/every shift)  Oral Assessment  (WDL) X  Lips Dry  Teeth Missing (Comment)  Tongue Dry  Mucous Membrane(s) Pale   Saliva Absent  Is patient on any of following O2 devices? None of the above  Nutritional status No high risk factors  Oral Assessment Risk  High Risk  HEENT  HEENT (WDL) X  Vision Check No  Voice Other (Comment) (not responding)  Respiratory  Respiratory Pattern Cheyne-Stokes  Respiratory (WDL) X  Cardiac  Cardiac (WDL) X  Heart Sounds No adventitious heart sounds  Antiarrhythmic device No  Vascular  Vascular (WDL) X  Musculoskeletal  Musculoskeletal (WDL) X  Gastrointestinal  Gastrointestinal (WDL) X  GU Assessment  Genitourinary (WDL) X  Genitourinary Symptoms Existing urinary catheter  Urethral Catheter Hailey W. Latex;Double-lumen 14 Fr.  Placement Date/Time: 05-06-2024 9062   Person Inserting LDA: Norlene MOHR  Person Assisting with Catheter Insertion: Donnice RAMAN  Patient Location at Time of Insertion [facility], Department, Room Number): [facility] 18  Catheter Type: Latex;Double-lumen  Tube Size (...  Indication for Insertion or Continuance of Catheter Unstable critically ill patients first 24-48 hours (See Criteria)  Site Assessment Clean, Dry, Intact  Catheter Maintenance Bag below level of bladder;Catheter secured;Drainage bag/tubing not touching floor;No dependent loops;Insertion date on drainage bag;Seal intact  Collection Container Standard drainage bag  Securement Method Adhesive securement device  Urinary Catheter Interventions (if applicable) Unclamped  Psychosocial  Psychosocial (WDL) X  Patient Behaviors Not interactive  Needs Expressed Emotional;Spiritual  Psychosocial Additional Assessments Family behavior;Visitor behavior  Family Behavior Appropriate for situation;Supportive;Tearful  Visitor Behavior Appropriate for situation;Supportive;Tearful  Emotional support given Given to patient's visitors;Given to patient's family;Given to patient  Integumentary  Integumentary (WDL) X  Nurse assisting at admit/transfer - Full skin assessment Delon,  RN  Skin Color  Pale;Mottled  Skin Condition Dry  Skin Integrity Intact  Skin Turgor Tenting  Braden Scale (Ages 8 and up)  Sensory Perceptions 1  Moisture 1  Activity 1  Mobility 1  Nutrition 1  Friction and Shear 1  Braden Scale Score 6  Braden Interventions  Required Braden Scale Bundle Interventions *See Row Information* At High Risk for pressure injury (<13) - At Risk & At High Risk requirements implemented

## 2024-05-04 NOTE — ED Triage Notes (Signed)
 Rcems from home cc of vomiting blood. Dark emesis. 60/40is bp with ems 74% with cold fingers. HR 124. 346 CBG.

## 2024-05-04 NOTE — Consult Note (Incomplete)
 Consultation Note Date: April 09, 2024   Patient Name: Jeanne Rogers  DOB: January 24, 1941  MRN: [id number]  Age / Sex: 83 y.o., female  PCP: Bluford Jacqulyn MATSU, DO Referring Physician: Pearlean Manus, MD  Reason for Consultation: {Reason for Consult:23484}  HPI/Patient Profile: 83 y.o. female  with past medical history of *** admitted on 04-09-24 with ***.    PMT has been consulted to assist with goals of care conversation.  Clinical Assessment and Goals of Care:  I have reviewed medical records including EPIC notes, labs and imaging (independently reviewed), ***, assessed the patient and then *** with *** to discuss diagnosis prognosis, GOC, EOL wishes, disposition and options. Collaborated directly with ***.   I introduced Palliative Medicine as specialized medical care for people living with serious illness. It focuses on providing relief from the symptoms and stress of a serious illness. The goal is to improve quality of life for both the patient and the family.  We discussed a brief life review of the patient and then focused on their current illness. ***  I attempted to elicit values and goals of care important to the patient.    Medical History Review and Family/Patient Understanding:   ***  Social History:  ***  Functional and Nutritional State:  ***  Palliative Symptoms:  ***  Advance Directives/Goals of Care/Anticipatory care planning Discussion:  A detailed discussion regarding GOC, advanced directives, and anticipatory care planning was had. ***    The difference between aggressive medical intervention and comfort care was considered in light of the patient's goals of care. Hospice and Palliative Care services outpatient were explained and offered.   Discussed the importance of continued conversation with family and the medical providers regarding overall plan of care and treatment options, ensuring decisions are within the context of  the patient's values and GOCs.   Questions and concerns were addressed.  Hard Choices booklet left for review. The family was encouraged to call with questions or concerns.  PMT will continue to support holistically.  Primary Decision maker and health care surrogate:  {Primary Decision Fjxzm:78612}  Code Status:  ***    SUMMARY OF RECOMMENDATIONS   *** Code Status/Advance Care Planning: {Palliative Code status:23503}   Symptom Management:  ***  Palliative Prophylaxis:  {Palliative Prophylaxis:21015}  Additional Recommendations (Limitations, Scope, Preferences): {Recommended Scope and Preferences:21019}  Psycho-social/Spiritual:  Desire for further Chaplaincy support:{YES NO:22349} Additional Recommendations: {PAL SOCIAL:21064}  Prognosis:  {Palliative Care Prognosis:23504}  Discharge Planning: {Palliative dispostion:23505}      Primary Diagnoses: Present on Admission:  Acute GI bleeding    Physical Exam  Vital Signs: BP 93/61   Pulse (!) 126   Temp 99.3 F (37.4 C) (Rectal)   Resp (!) 29   SpO2 (!) 86%  Pain Scale: Faces   Pain Score: 8    SpO2: SpO2: (!) 86 % O2 Device:SpO2: (!) 86 % O2 Flow Rate: .    Palliative Assessment/Data:    Total time: I spent *** minutes in the care of the patient today in the above activities and documenting the encounter.  Billing based on MDM: ***  {Problems Addressed:304933}  {Amount and/or Complexity of Ijuj:695065}  {Risks:304936}   Laymon CHRISTELLA Pinal, NP  Palliative Medicine Team Team phone # [phone]  Thank you for allowing the Palliative Medicine Team to assist in the care of this patient. Please utilize secure chat with additional questions, if there is no response within 30 minutes please call the above phone number.  Palliative Medicine Team  providers are available by phone from 7am to 7pm daily and can be reached through the team cell phone.  Should this patient require assistance  outside of these hours, please call the patient's attending physician.

## 2024-05-04 NOTE — ED Provider Notes (Signed)
 [place] EMERGENCY DEPARTMENT AT [facility] Provider Note   CSN: [id number] Arrival date & time: 05/01/2024  9343     Patient presents with: Hematemesis   Jeanne Rogers is a 83 y.o. female.   HPI Patient with dementia presents from home with black emesis, weakness. History is obtained by EMS, initially from husband to those individuals. Reportedly the patient has been eating, drinking less than usual, progressively weaker.  Over the past 12 hours that she started having multiple episodes of black emesis.  Patient himself does not contribute to history, level 5 caveat. EMS reports patient was hypotensive on arrival, weak in appearance, received fluids.    Prior to Admission medications   Medication Sig Start Date End Date Taking? Authorizing Provider  acetaminophen  (TYLENOL ) 500 MG tablet Take 500 mg by mouth 2 (two) times daily.   Yes [provider]  aspirin  EC 81 MG tablet Take 81 mg by mouth every evening. Swallow whole.   Yes [provider]  citalopram  (CELEXA ) 10 MG tablet TAKE 1 TABLET(10 MG) BY MOUTH DAILY 09/14/23  Yes Cook, Jayce G, DO  ezetimibe  (ZETIA ) 10 MG tablet TAKE 1 TABLET(10 MG) BY MOUTH DAILY 10/22/23  Yes Cook, Jayce G, DO  isosorbide  mononitrate (IMDUR ) 30 MG 24 hr tablet TAKE 1 TABLET(30 MG) BY MOUTH DAILY 08/20/23  Yes Miriam Norris, NP  memantine  (NAMENDA ) 10 MG tablet TAKE 1 TABLET(10 MG) BY MOUTH TWICE DAILY 09/22/23  Yes Cook, Jayce G, DO  metoprolol  succinate (TOPROL -XL) 25 MG 24 hr tablet TAKE 1/2 TABLET BY MOUTH EVERY DAY 02/18/24  Yes Nishan, Peter C, MD  nitrofurantoin , macrocrystal-monohydrate, (MACROBID ) 100 MG capsule Take 1 capsule (100 mg total) by mouth at bedtime. 01/05/24  Yes Cook, Jayce G, DO    Allergies: Clopidogrel , Methergine [methylergonovine maleate], and Statins    Review of Systems  Updated Vital Signs BP 93/61   Pulse (!) 126   Temp 98.8 F (37.1 C) (Rectal)   Resp (!) 29   SpO2 (!) 86%   Physical  Exam Vitals and nursing note reviewed.  Constitutional:      Appearance: She is well-developed. She is ill-appearing.  HENT:     Head: Normocephalic and atraumatic.  Eyes:     Conjunctiva/sclera: Conjunctivae normal.  Cardiovascular:     Rate and Rhythm: Regular rhythm. Tachycardia present.  Pulmonary:     Effort: Pulmonary effort is normal. No respiratory distress.     Breath sounds: Normal breath sounds. No stridor.  Abdominal:     General: There is no distension.     Tenderness: There is abdominal tenderness.  Skin:    General: Skin is warm and dry.  Neurological:     Cranial Nerves: No cranial nerve deficit.     Motor: Atrophy present.  Psychiatric:        Behavior: Behavior is slowed and withdrawn.        Cognition and Memory: Cognition is impaired. Memory is impaired.     (all labs ordered are listed, but only abnormal results are displayed) Labs Reviewed  COMPREHENSIVE METABOLIC PANEL WITH GFR - Abnormal; Notable for the following components:      Result Value   CO2 18 (*)    Glucose, Bld 200 (*)    BUN 43 (*)    Creatinine, Ser 2.41 (*)    AST 45 (*)    GFR, Estimated 20 (*)    Anion gap 27 (*)    All other components within normal  limits  CBC WITH DIFFERENTIAL/PLATELET - Abnormal; Notable for the following components:   WBC 2.4 (*)    Hemoglobin 15.3 (*)    HCT 47.3 (*)    Platelets 503 (*)    Lymphs Abs 0.5 (*)    All other components within normal limits  URINALYSIS, ROUTINE W REFLEX MICROSCOPIC - Abnormal; Notable for the following components:   Color, Urine AMBER (*)    APPearance CLOUDY (*)    Protein, ur 100 (*)    Bacteria, UA RARE (*)    All other components within normal limits  LACTIC ACID, PLASMA - Abnormal; Notable for the following components:   Lactic Acid, Venous >9.0 (*)    All other components within normal limits  LACTIC ACID, PLASMA - Abnormal; Notable for the following components:   Lactic Acid, Venous >9.0 (*)    All other  components within normal limits  POC OCCULT BLOOD, ED - Normal  MRSA NEXT GEN BY PCR, NASAL  LIPASE, BLOOD  PROTIME-INR  LACTIC ACID, PLASMA  LACTIC ACID, PLASMA  TYPE AND SCREEN     Radiology: DG Chest Portable 1 View Addendum Date: 2024-04-22 ADDENDUM REPORT: 04/22/24 13:20 ADDENDUM: These results were discussed by telephone at the time of interpretation on 22-Apr-2024 at 1:19 pm with provider LINDSAY BRIDGES. Dr. Kallie stated that coffee-ground emesis was obtained from the tube after placement consistent with intra esophageal position, not tracheal. The distal tip of the tube remains above the expected position of the large hiatal hernia. Electronically Signed   By: Lynwood Landy Raddle M.D.   On: 04-22-2024 13:20   Result Date: 04-22-24 CLINICAL DATA:  Nasogastric tube placement. EXAM: PORTABLE CHEST 1 VIEW COMPARISON:  March 30, 2024. FINDINGS: Stable cardiomediastinal silhouette. Stable hiatal hernia. Nasogastric tube appears to be looped within cervical esophagus with distal tip and expected position of either proximal thoracic esophagus or possibly trachea. Removal is recommended. IMPRESSION: Nasogastric tube appears to be looped within cervical esophagus with distal tip in expected position of either proximal thoracic esophagus or possibly trachea. Removal is recommended. Electronically Signed: By: Lynwood Landy Raddle M.D. On: 22-Apr-2024 13:03   CT L-SPINE NO CHARGE Result Date: 04-22-24 EXAM: CT OF THE LUMBAR SPINE WITHOUT CONTRAST April 22, 2024 09:37:54 AM TECHNIQUE: CT of the lumbar spine was performed without the administration of intravenous contrast. Multiplanar reformatted images are provided for review. Automated exposure control, iterative reconstruction, and/or weight based adjustment of the mA/kV was utilized to reduce the radiation dose to as low as reasonably achievable. COMPARISON: CT lumbar spine dated 07/06/2013. CLINICAL HISTORY: Fall one week ago, unsure if patient hit their head,  decreased appetite. FINDINGS: BONES AND ALIGNMENT: Since the previous study, the patient has undergone vertebral augmentation at T12 and L1. There has been significant interval worsening of a compression fracture of L2 which has lost approximately 70% of its height centrally and 30% of its height anteriorly. There is mild buckling of the posterior wall of the vertebral body on the left. There is moderate downward bowing of the superior endplate of L5. There is mild rotatory levoscoliosis of the thoracolumbar spine. DEGENERATIVE CHANGES: No significant degenerative changes. SOFT TISSUES: No acute abnormality. IMPRESSION: 1. Significant interval worsening of a compression fracture of L2 with approximately 70% central and 30% anterior height loss, and mild buckling of the posterior wall on the left. 2. Moderate downward bowing of the superior endplate of L5. 3. Mild rotatory levoscoliosis of the thoracolumbar spine. Electronically signed by: Evalene Coho MD 22-Apr-2024 09:51  AM EDT RP Workstation: [id number]   CT ABDOMEN PELVIS WO CONTRAST Result Date: 04-09-24 EXAM: CT ABDOMEN AND PELVIS WITHOUT CONTRAST 04/09/24 09:37:54 AM TECHNIQUE: CT of the abdomen and pelvis was performed without the administration of intravenous contrast. Multiplanar reformatted images are provided for review. Automated exposure control, iterative reconstruction, and/or weight-based adjustment of the mA/kV was utilized to reduce the radiation dose to as low as reasonably achievable. COMPARISON: CT of the abdomen and pelvis dated 01/28/2004. CLINICAL HISTORY: Abdominal pain, acute, nonlocalized. Fall one week ago, unsure if patient hit their head, decreased appetite. 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 There are ground-glass and reticular opacities in the dependent periphery of the lower lobes bilaterally. LIVER: Normal size and contour.  GALLBLADDER AND BILE DUCTS: There is a calcified stone within the gallbladder. There is no evidence of cholecystitis. SPLEEN: Normal size. No focal lesion. PANCREAS: No mass. No ductal dilatation. ADRENAL GLANDS: Normal appearance. No mass. KIDNEYS, URETERS AND BLADDER: There is a septated bilobular cyst arising from the lower pole of the right kidney, measuring approximately 6.5 x 3.5 cm. There is a parapelvic cyst present centrally within the left kidney, measuring 4.3 x 3.4 cm. Per consensus, no follow-up is needed for simple Bosniak type 1 and 2 renal cysts, unless the patient has a malignancy history or risk factors. No stones in the ureters. No hydronephrosis. No perinephric or periureteral stranding. Urinary bladder is unremarkable. GI AND BOWEL: There is diffuse abnormal distention of the small bowel which measures up to approximately 3.4 cm in diameter. It is distended with fluid. The colon is collapsed. 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 There is a large fluid-filled hiatus hernia. PERITONEUM AND RETROPERITONEUM: No ascites. No free air. VASCULATURE: The abdominal aorta is normal in caliber, but densely calcified. LYMPH NODES: No lymphadenopathy. REPRODUCTIVE ORGANS: The patient is status post hysterectomy and bilateral salpingo-oophorectomy. BONES AND SOFT TISSUES: There is mild levoscoliosis of the thoracolumbar spine. L1 vertebral bodies have been treated with vertebral augmentation. No acute osseous abnormality. No focal soft tissue abnormality. IMPRESSION: 1. Diffuse abnormal distention of the small bowel with fluid, measuring up to approximately 3.4 cm in diameter. The colon is collapsed. Findings are consistent with distal small bowel obstruction versus ileus. 2. Large fluid-filled hiatus hernia. 3. Calcified stone within the gallbladder without evidence of cholecystitis. 4. Septated bilobular cyst in the lower pole of the right kidney, measuring approximately  6.5 x 3.5 cm. 5. Parapelvic cyst centrally within the left kidney, measuring 4.3 x 3.4 cm. Electronically signed by: Evalene Coho MD 04-09-2024 09:47 AM EDT RP Workstation: [id number]   CT Head Wo Contrast Result Date: 2024-04-09 EXAM: CT HEAD WITHOUT CONTRAST 04-09-2024 09:37:54 AM TECHNIQUE: CT of the head was performed without the administration of intravenous contrast. Automated exposure control, iterative reconstruction, and/or weight based adjustment of the mA/kV was utilized to reduce the radiation dose to as low as reasonably achievable. COMPARISON: 08/24/2019 CLINICAL HISTORY: Mental status change, unknown cause. Fall one week ago, unsure if patient hit their head, decreased appetite. FINDINGS: BRAIN AND VENTRICLES: No acute hemorrhage. No evidence of acute infarct. No hydrocephalus. No extra-axial collection. No mass effect or midline shift. Moderate generalized cerebral volume loss. 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 ORBITS: No acute abnormality. SINUSES: Mild mucosal disease within the left maxillary sinus. SOFT TISSUES AND SKULL: No acute soft tissue abnormality. No skull fracture. Debris within the right external auditory  canal. VASCULATURE: Calcifications within the carotid siphons. IMPRESSION: 1. No acute intracranial abnormality. 2. Moderate generalized cerebral volume loss and moderate periventricular and deep cerebral white matter disease. Electronically signed by: Evalene Coho MD May 01, 2024 09:40 AM EDT RP Workstation: [id number]     Procedures   Medications Ordered in the ED  lactated ringers  infusion ( Intravenous New Bag/Given 05/01/2024 1235)  piperacillin -tazobactam (ZOSYN ) IVPB 3.375 g (3.375 g Intravenous New Bag/Given 2024/05/01 1236)  piperacillin -tazobactam (ZOSYN ) 2.25 g in sodium chloride  0.9 % 50 mL IVPB (has no administration in time range)  Chlorhexidine  Gluconate Cloth 2 % PADS 6 each (has no administration in time range)  sodium chloride   0.9 % bolus 1,000 mL (0 mLs Intravenous Stopped 05/01/24 0834)  pantoprazole  (PROTONIX ) injection 40 mg (40 mg Intravenous Given 05-01-2024 0754)  morphine  (PF) 2 MG/ML injection 2 mg (2 mg Intravenous Given 2024-05-01 1033)  lactated ringers  bolus 1,000 mL (0 mLs Intravenous Stopped 2024/05/01 1146)  morphine  (PF) 2 MG/ML injection 2 mg (2 mg Intravenous Given May 01, 2024 1236)                                    Medical Decision Making Elderly female with dementia presents from home with concern for weakness, vomiting, questionable hematemesis in the context of anorexia, nausea, progressive weakness. Broad differential including hematemesis, exsanguination, intra-abdominal bleed, bacteremia, sepsis, dehydration, progression of disease. Patient with resuscitation including IV ultrasound access   Amount and/or Complexity of Data Reviewed Independent Historian: EMS External Data Reviewed: notes. Labs: ordered. Decision-making details documented in ED Course. Radiology: ordered. ECG/medicine tests: ordered and independent interpretation performed. Decision-making details documented in ED Course.  Risk Prescription drug management. Decision regarding hospitalization. Diagnosis or treatment significantly limited by social determinants of health.    Update:, Husband, daughter at bedside.  Additional family member is a ER physician, not currently present. We discussed initial findings including initial concern for altered mental status, dehydration, with initial lactic acid level of 9. Patient has received fluid resuscitation, and additional labs, imaging pending.  Physician daughter at bedside, we discussed the patient's history, presentation, additional findings now notable for acute kidney injury, as above lactic acid 9, and with vomiting, abdominal pelvis CT is pending, daughter requests lumbar recons given history of fall 2 weeks ago, concern for worsening progression of compression fracture possible  contributor to pain, anorexia.   Update:, Head CT unremarkable, abdominal pelvis CT reviewed, notable for possible bowel obstruction versus ileus versus complications of a hiatal hernia.  No obvious obstruction for renal failure.  Patient continues to receive fluid resuscitation   Update: Have discussed the patient's case with our surgeon, Dr. Kallie, at bedside, she had nasogastric tube placed, with production of dark material.  We discussed options for additional management, the patient will continue fluid resuscitation has received Zosyn  for possible intra-abdominal infection, remains afebrile, blood pressure slightly labile.  We lengthy conversation about goals of care, patient is DNR, no additional resuscitation with fluids, antibiotics, will continue.  Case discussed with our admitting team, palliative care team. On preparing for admission patient remains mildly tachycardic, MAP greater than 65, clinically improved since arrival, though with persistent lactic acidosis, and given all of the aforementioned concerns, is in guarded condition.  Final diagnoses:  AKI (acute kidney injury) (HCC)  Acute encephalopathy  SBO (small bowel obstruction) (HCC)    CRITICAL CARE Performed by: Lamar Salen Total critical care time: 75 minutes Critical  care time was exclusive of separately billable procedures and treating other patients. Critical care was necessary to treat or prevent imminent or life-threatening deterioration. Critical care was time spent personally by me on the following activities: development of treatment plan with patient and/or surrogate as well as nursing, discussions with consultants, evaluation of patient's response to treatment, examination of patient, obtaining history from patient or surrogate, ordering and performing treatments and interventions, ordering and review of laboratory studies, ordering and review of radiographic studies, pulse oximetry and re-evaluation of  patient's condition.    Garrick Charleston, MD May 04, 2024 1352

## 2024-05-04 NOTE — Death Summary Note (Signed)
 DEATH SUMMARY   Patient Details  Name: Jeanne Rogers MRN: [id number] DOB: 04-27-1941 ERE:Rnnx, Jacqulyn MATSU, DO Admission/Discharge Information   Admit Date:  12-Apr-2024  Date of Death: Date of Death: Apr 12, 2024  Time of Death: Time of Death: 1530  Length of Stay: 0   Principle Cause of death: distal small bowel obstruction   Hospital Diagnoses: Principal Problem:   Acute GI bleeding Active Problems:   Hiatal hernia   Coffee ground emesis   Ileus (HCC)   Acute renal failure [facility])   Hospital Course: 1)Distal small bowel obstruction -- -Lactic acid was greater than 9 despite IV fluids repeat lactic acid was still greater than 9 EDP consulted general surgery due to concerns about possible ischemic bowel --General Surgery consult from Dr. Kallie appreciated Patient's daughter Dr. Sallie Rumalda who is an ED physician--discussed case with EDP Dr. Garrick and general surgeon Dr. Kallie--- patient was felt to be a poor candidate for any surgical intervention -- Family agreed to transition to comfort care -- Palliative care input appreciated -Patient expired with family at bedside  2) severe lactic acidosis--due to the above and above - Cannot rule out ischemic bowel - Per general surgeon not a good candidate for operative intervention - Per family request transition to full comfort care -Patient expired with family at bedside  3) social/ethics--palliative care consult appreciated Discussed with patient's daughter Dr.Meiki Sarracino (who is an ED Physician) and patient's husband at bedside -- Family request transition to comfort care - DNR/DNI -Patient expired with family at bedside  4)AKI----acute kidney injury -due to #1 above --Creatinine was up to 2.41 it was 0.87 on 10/21/2023 -Family requested transition to full comfort care -Patient expired with family at bedside  Procedures: NG tube placement  Consultations: General surgery and palliative care  The results of  significant diagnostics from this hospitalization (including imaging, microbiology, ancillary and laboratory) are listed below for reference.   Significant Diagnostic Studies: DG Chest Portable 1 View Addendum Date: 04/12/24 ADDENDUM REPORT: 2024-04-12 13:20 ADDENDUM: These results were discussed by telephone at the time of interpretation on 2024/04/12 at 1:19 pm with provider LINDSAY BRIDGES. Dr. Kallie stated that coffee-ground emesis was obtained from the tube after placement consistent with intra esophageal position, not tracheal. The distal tip of the tube remains above the expected position of the large hiatal hernia. Electronically Signed   By: Lynwood Landy Raddle M.D.   On: April 12, 2024 13:20   Result Date: 04/12/24 CLINICAL DATA:  Nasogastric tube placement. EXAM: PORTABLE CHEST 1 VIEW COMPARISON:  March 30, 2024. FINDINGS: Stable cardiomediastinal silhouette. Stable hiatal hernia. Nasogastric tube appears to be looped within cervical esophagus with distal tip and expected position of either proximal thoracic esophagus or possibly trachea. Removal is recommended. IMPRESSION: Nasogastric tube appears to be looped within cervical esophagus with distal tip in expected position of either proximal thoracic esophagus or possibly trachea. Removal is recommended. Electronically Signed: By: Lynwood Landy Raddle M.D. On: 04-12-2024 13:03   CT L-SPINE NO CHARGE Result Date: 2024-04-12 EXAM: CT OF THE LUMBAR SPINE WITHOUT CONTRAST 04-12-24 09:37:54 AM TECHNIQUE: CT of the lumbar spine was performed without the administration of intravenous contrast. Multiplanar reformatted images are provided for review. Automated exposure control, iterative reconstruction, and/or weight based adjustment of the mA/kV was utilized to reduce the radiation dose to as low as reasonably achievable. COMPARISON: CT lumbar spine dated 07/06/2013. CLINICAL HISTORY: Fall one week ago, unsure if patient hit their head, decreased appetite.  FINDINGS: BONES  AND ALIGNMENT: Since the previous study, the patient has undergone vertebral augmentation at T12 and L1. There has been significant interval worsening of a compression fracture of L2 which has lost approximately 70% of its height centrally and 30% of its height anteriorly. There is mild buckling of the posterior wall of the vertebral body on the left. There is moderate downward bowing of the superior endplate of L5. There is mild rotatory levoscoliosis of the thoracolumbar spine. DEGENERATIVE CHANGES: No significant degenerative changes. SOFT TISSUES: No acute abnormality. IMPRESSION: 1. Significant interval worsening of a compression fracture of L2 with approximately 70% central and 30% anterior height loss, and mild buckling of the posterior wall on the left. 2. Moderate downward bowing of the superior endplate of L5. 3. Mild rotatory levoscoliosis of the thoracolumbar spine. Electronically signed by: Evalene Coho MD Apr 27, 2024 09:51 AM EDT RP Workstation: [id number]   CT ABDOMEN PELVIS WO CONTRAST Result Date: 04/27/2024 EXAM: CT ABDOMEN AND PELVIS WITHOUT CONTRAST 27-Apr-2024 09:37:54 AM TECHNIQUE: CT of the abdomen and pelvis was performed without the administration of intravenous contrast. Multiplanar reformatted images are provided for review. Automated exposure control, iterative reconstruction, and/or weight-based adjustment of the mA/kV was utilized to reduce the radiation dose to as low as reasonably achievable. COMPARISON: CT of the abdomen and pelvis dated 01/28/2004. CLINICAL HISTORY: Abdominal pain, acute, nonlocalized. Fall one week ago, unsure if patient hit their head, decreased appetite. 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 There are ground-glass and reticular opacities in the dependent periphery of the lower lobes bilaterally. LIVER: Normal size and contour. GALLBLADDER AND  BILE DUCTS: There is a calcified stone within the gallbladder. There is no evidence of cholecystitis. SPLEEN: Normal size. No focal lesion. PANCREAS: No mass. No ductal dilatation. ADRENAL GLANDS: Normal appearance. No mass. KIDNEYS, URETERS AND BLADDER: There is a septated bilobular cyst arising from the lower pole of the right kidney, measuring approximately 6.5 x 3.5 cm. There is a parapelvic cyst present centrally within the left kidney, measuring 4.3 x 3.4 cm. Per consensus, no follow-up is needed for simple Bosniak type 1 and 2 renal cysts, unless the patient has a malignancy history or risk factors. No stones in the ureters. No hydronephrosis. No perinephric or periureteral stranding. Urinary bladder is unremarkable. GI AND BOWEL: There is diffuse abnormal distention of the small bowel which measures up to approximately 3.4 cm in diameter. It is distended with fluid. The colon is collapsed. 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 There is a large fluid-filled hiatus hernia. PERITONEUM AND RETROPERITONEUM: No ascites. No free air. VASCULATURE: The abdominal aorta is normal in caliber, but densely calcified. LYMPH NODES: No lymphadenopathy. REPRODUCTIVE ORGANS: The patient is status post hysterectomy and bilateral salpingo-oophorectomy. BONES AND SOFT TISSUES: There is mild levoscoliosis of the thoracolumbar spine. L1 vertebral bodies have been treated with vertebral augmentation. No acute osseous abnormality. No focal soft tissue abnormality. IMPRESSION: 1. Diffuse abnormal distention of the small bowel with fluid, measuring up to approximately 3.4 cm in diameter. The colon is collapsed. Findings are consistent with distal small bowel obstruction versus ileus. 2. Large fluid-filled hiatus hernia. 3. Calcified stone within the gallbladder without evidence of cholecystitis. 4. Septated bilobular cyst in the lower pole of the right kidney, measuring approximately 6.5 x 3.5 cm.  5. Parapelvic cyst centrally within the left kidney, measuring 4.3 x 3.4 cm. Electronically signed by: Evalene Coho MD Apr 27, 2024 09:47 AM EDT RP  Workstation: [id number]   CT Head Wo Contrast Result Date: 2024/05/06 EXAM: CT HEAD WITHOUT CONTRAST 2024/05/06 09:37:54 AM TECHNIQUE: CT of the head was performed without the administration of intravenous contrast. Automated exposure control, iterative reconstruction, and/or weight based adjustment of the mA/kV was utilized to reduce the radiation dose to as low as reasonably achievable. COMPARISON: 08/24/2019 CLINICAL HISTORY: Mental status change, unknown cause. Fall one week ago, unsure if patient hit their head, decreased appetite. FINDINGS: BRAIN AND VENTRICLES: No acute hemorrhage. No evidence of acute infarct. No hydrocephalus. No extra-axial collection. No mass effect or midline shift. Moderate generalized cerebral volume loss. 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 ORBITS: No acute abnormality. SINUSES: Mild mucosal disease within the left maxillary sinus. SOFT TISSUES AND SKULL: No acute soft tissue abnormality. No skull fracture. Debris within the right external auditory canal. VASCULATURE: Calcifications within the carotid siphons. IMPRESSION: 1. No acute intracranial abnormality. 2. Moderate generalized cerebral volume loss and moderate periventricular and deep cerebral white matter disease. Electronically signed by: Evalene Coho MD May 06, 2024 09:40 AM EDT RP Workstation: [id number]   DG Ribs Unilateral Left Result Date: 03/30/2024 CLINICAL DATA:  Fall, pain EXAM: LEFT RIBS - 2 VIEW COMPARISON:  Chest radiograph August 06, 2020 FINDINGS: Mild irregularity of the left 12th rib with questionable calls formation. Large hiatal hernia.  Status post vertebroplasty T8, T12 and L1. Atherosclerotic calcifications of abdominal aorta. Biapical scarring, otherwise visualized lungs are unremarkable. IMPRESSION: Query left 12 rib  deformity suggestive of possible fracture of indeterminate chronicity. Recommend CT for further assessment if clinically warranted. Electronically Signed   By: Megan  Zare M.D.   On: 03/30/2024 16:26    Time spent: 36 minutes  Signed: Rendall Carwin, MD 05/06/2024

## 2024-05-04 NOTE — Consult Note (Signed)
 [facility] Consult  Reason for Consult: Abdominal pain, coffee ground emesis, lactic acidosis  Referring Physician:  Dr. Garrick  Chief Complaint   Hematemesis     HPI: Jeanne Rogers is a 83 y.o. female with dementia, multiple medical issues, recent fall 10 days ago who comes in poor intake and somnolence for the past few days at home. She lives with her husband and her daughter who is an ED physician is in the room. The patient has not been doing much and has not been moving or walking for several days. She only really eats cheesecake at this time in her life and choked on that some yesterday. She was brought into the hospital with the coffee ground emesis today and was found to be hypotension, tachycardiac, lactic acidosis and not answering any questions. She is improved some with fluid resuscitation with improvement in her BP and she is more awake per the ED and family.  Dr. Garrick was concerned for possible ischemic bowel with the lactic acidosis and consulted me. The CT scan demonstrates a large hiatal hernia, pushing her heart anterior and to the left, and dilated small bowel but no signs of free air or pneumatosis.   She is not really responding regularly and is moaning with the exam and NG tube placement.   Past Medical History:  Diagnosis Date   Anemia    Collapse, lung    Complication of anesthesia    very sensitive to anesthesia   GERD (gastroesophageal reflux disease)    Hyperlipidemia    Hypertension    Osteoporosis    PONV (postoperative nausea and vomiting)    PUD (peptic ulcer disease)    Spontaneous pneumothorax     Past Surgical History:  Procedure Laterality Date   ABDOMINAL HYSTERECTOMY     COLONOSCOPY N/A 01/04/2014   Procedure: COLONOSCOPY;  Surgeon: Claudis RAYMOND Rivet, MD;  Location: [facility];  Service: Endoscopy;  Laterality: N/A;  200   ESOPHAGOGASTRODUODENOSCOPY N/A 01/04/2014   Procedure: ESOPHAGOGASTRODUODENOSCOPY (EGD);   Surgeon: Claudis RAYMOND Rivet, MD;  Location: [facility];  Service: Endoscopy;  Laterality: N/A;   hysterectomy     KYPHOPLASTY N/A 08/08/2020   Procedure: KYPHOPLASTY THORACIC EIGHT AND THORACIC TWELVE;  Surgeon: Burnetta Aures, MD;  Location: [facility] OR;  Service: Orthopedics;  Laterality: N/A;   LEFT HEART CATH AND CORONARY ANGIOGRAPHY N/A 05/10/2020   Procedure: LEFT HEART CATH AND CORONARY ANGIOGRAPHY;  Surgeon: Claudene Victory ORN, MD;  Location: [facility];  Service: Cardiovascular;  Laterality: N/A;   MASS EXCISION Right 02/07/2016   Procedure: EXCISION MASS RIGHT INDEX FINGER AND RIGHT HAND MASS;  Surgeon: Franky Curia, MD;  Location: [facility];  Service: Orthopedics;  Laterality: Right;   OOPHORECTOMY Bilateral 08/05/2003   SPINE SURGERY      Family History  Problem Relation Age of Onset   Hyperlipidemia Mother    Hypertension Father     Social History   Tobacco Use   Smoking status: Former    Current packs/day: 0.00    Types: Cigarettes    Quit date: 10/02/1988    Years since quitting: 35.5   Smokeless tobacco: Never  Vaping Use   Vaping status: Never Used  Substance Use Topics   Alcohol  use: No   Drug use: No    Medications: I have reviewed the patient's current medications. Current Facility-Administered Medications  Medication Dose Route Frequency Provider Last Rate Last Admin   lactated ringers  infusion  Intravenous Continuous Garrick Charleston, MD 125 mL/hr at 26-Apr-2024 1235 New Bag at April 26, 2024 1235   morphine  (PF) 2 MG/ML injection 2 mg  2 mg Intravenous Once Kallie Manuelita BROCKS, MD       piperacillin -tazobactam (ZOSYN ) 2.25 g in sodium chloride  0.9 % 50 mL IVPB  2.25 g Intravenous Q8H Tanda Dempsey SAUNDERS, RPH       piperacillin -tazobactam (ZOSYN ) IVPB 3.375 g  3.375 g Intravenous Once Tanda Dempsey SAUNDERS, [facility]       Current Outpatient Medications  Medication Sig Dispense Refill Last Dose/Taking   acetaminophen  (TYLENOL ) 500 MG tablet Take 500 mg by mouth 2  (two) times daily.   04/06/2024 Noon   aspirin  EC 81 MG tablet Take 81 mg by mouth every evening. Swallow whole.   Past Week   citalopram  (CELEXA ) 10 MG tablet TAKE 1 TABLET(10 MG) BY MOUTH DAILY 90 tablet 1 04/06/2024 Morning   ezetimibe  (ZETIA ) 10 MG tablet TAKE 1 TABLET(10 MG) BY MOUTH DAILY 90 tablet 2 Past Week   isosorbide  mononitrate (IMDUR ) 30 MG 24 hr tablet TAKE 1 TABLET(30 MG) BY MOUTH DAILY 90 tablet 1 Past Week   memantine  (NAMENDA ) 10 MG tablet TAKE 1 TABLET(10 MG) BY MOUTH TWICE DAILY 180 tablet 1 Past Week   metoprolol  succinate (TOPROL -XL) 25 MG 24 hr tablet TAKE 1/2 TABLET BY MOUTH EVERY DAY 45 tablet 3 Past Week   nitrofurantoin , macrocrystal-monohydrate, (MACROBID ) 100 MG capsule Take 1 capsule (100 mg total) by mouth at bedtime. 90 capsule 1 Past Week     Allergies  Allergen Reactions   Clopidogrel      Pain in thighs    Methergine [Methylergonovine Maleate] Other (See Comments)    Unknown   Statins Other (See Comments)    Myalgia     ROS:  Review of systems not obtained due to patient factors.  Blood pressure (!) 160/72, pulse (!) 120, temperature 98.8 F (37.1 C), temperature source Rectal, resp. rate (!) 24. Physical Exam Vitals reviewed.  Constitutional:      Appearance: She is underweight. She is ill-appearing.  HENT:     Head: Normocephalic.     Nose: Nose normal.  Cardiovascular:     Rate and Rhythm: Tachycardia present.  Pulmonary:     Comments: Oxygen in place, shallow breaths Abdominal:     General: There is distension.     Palpations: Abdomen is soft.     Tenderness: There is abdominal tenderness.  Neurological:     Mental Status: She is lethargic.     Comments: Not responding to questions, opens eyes and moans with stimulation     Results: Results for orders placed or performed during the hospital encounter of 2024-04-26 (from the past 48 hours)  Protime-INR     Status: None   Collection Time: 26-Apr-2024  7:34 AM  Result Value Ref Range    Prothrombin Time 14.9 11.4 - 15.2 seconds   INR 1.1 0.8 - 1.2    Comment: (NOTE) INR goal varies based on device and disease states. Performed at [facility], [address], [place], [place] [address]   Comprehensive metabolic panel     Status: Abnormal   Collection Time: April 26, 2024  7:37 AM  Result Value Ref Range   Sodium 144 135 - 145 mmol/L   Potassium 4.1 3.5 - 5.1 mmol/L   Chloride 99 98 - 111 mmol/L   CO2 18 (L) 22 - 32 mmol/L   Glucose, Bld 200 (H) 70 - 99 mg/dL  Comment: Glucose reference range applies only to samples taken after fasting for at least 8 hours.   BUN 43 (H) 8 - 23 mg/dL   Creatinine, Ser 7.58 (H) 0.44 - 1.00 mg/dL   Calcium  9.9 8.9 - 10.3 mg/dL   Total Protein 7.4 6.5 - 8.1 g/dL   Albumin 3.9 3.5 - 5.0 g/dL   AST 45 (H) 15 - 41 U/L   ALT 22 0 - 44 U/L    Comment: RESULT CONFIRMED BY MANUAL DILUTION   Alkaline Phosphatase 74 38 - 126 U/L   Total Bilirubin 0.9 0.0 - 1.2 mg/dL   GFR, Estimated 20 (L) >60 mL/min    Comment: (NOTE) Calculated using the CKD-EPI Creatinine Equation (2021)    Anion gap 27 (H) 5 - 15    Comment: ELECTROLYTES REPEATED TO VERIFY Performed at [facility], [address] [place], [place] [address]   CBC with Differential     Status: Abnormal   Collection Time: 04/16/2024  7:37 AM  Result Value Ref Range   WBC 2.4 (L) 4.0 - 10.5 K/uL   RBC 4.94 3.87 - 5.11 MIL/uL   Hemoglobin 15.3 (H) 12.0 - 15.0 g/dL   HCT 52.6 (H) 63.9 - 53.9 %   MCV 95.7 80.0 - 100.0 fL   MCH 31.0 26.0 - 34.0 pg   MCHC 32.3 30.0 - 36.0 g/dL    Comment: REPEATED TO VERIFY CORRECTED FOR COLD AGGLUTININS    RDW 14.8 11.5 - 15.5 %   Platelets 503 (H) 150 - 400 K/uL   nRBC 0.0 0.0 - 0.2 %   Neutrophils Relative % 74 %   Neutro Abs 1.8 1.7 - 7.7 K/uL   Lymphocytes Relative 19 %   Lymphs Abs 0.5 (L) 0.7 - 4.0 K/uL   Monocytes Relative 7 %   Monocytes Absolute 0.2 0.1 - 1.0 K/uL   Eosinophils Relative 0 %   Eosinophils Absolute 0.0 0.0 - 0.5 K/uL    Basophils Relative 0 %   Basophils Absolute 0.0 0.0 - 0.1 K/uL   Smear Review Normal platelet morphology    Immature Granulocytes 0 %   Abs Immature Granulocytes 0.00 0.00 - 0.07 K/uL   Reactive, Benign Lymphocytes PRESENT    Ovalocytes PRESENT     Comment: Performed at [facility], [address], [place], [place] [address]  Lipase, blood     Status: None   Collection Time: 16-Apr-2024  7:37 AM  Result Value Ref Range   Lipase 45 11 - 51 U/L    Comment: Performed at [facility], [address], [place], [place] [address]  Type and screen [facility]     Status: None   Collection Time: Apr 16, 2024  7:37 AM  Result Value Ref Range   ABO/RH(D) O NEG    Antibody Screen NEG    Sample Expiration      04/10/2024,2359 Performed at [facility], [address] [place], [place] [address]   Lactic acid, plasma     Status: Abnormal   Collection Time: 16-Apr-2024  7:37 AM  Result Value Ref Range   Lactic Acid, Venous >9.0 (HH) 0.5 - 1.9 mmol/L    Comment: CRITICAL RESULT CALLED TO, READ BACK BY AND VERIFIED WITH  SMALLWOOD,M AT 8:00AM ON April 16, 2024 BY [facility] Performed at [facility], [address] [place], [place] [address]   POC occult blood, ED     Status: Normal   Collection Time: 04-16-24  7:48 AM  Result Value Ref Range  Negative Negative   Lactic acid, plasma     Status: Abnormal   Collection Time: 17-Apr-2024 10:00 AM  Result Value Ref Range   Lactic Acid, Venous >9.0 (HH) 0.5 - 1.9 mmol/L    Comment: CRITICAL VALUE NOTED. VALUE IS CONSISTENT WITH PREVIOUSLY REPORTED/CALLED VALUE Performed at [facility], [address] [place], [place] [address]    Personally reviewed CT ab/p and showed her family- large hiatal hernia with fluid and displacement of her heart due to the hernia, dilated small bowel, no pneumatosis or free air but limited exam as it is non contrasted  CT L-SPINE NO CHARGE Result Date: 04-17-24 EXAM: CT OF THE LUMBAR SPINE WITHOUT CONTRAST 04-17-24 09:37:54  AM TECHNIQUE: CT of the lumbar spine was performed without the administration of intravenous contrast. Multiplanar reformatted images are provided for review. Automated exposure control, iterative reconstruction, and/or weight based adjustment of the mA/kV was utilized to reduce the radiation dose to as low as reasonably achievable. COMPARISON: CT lumbar spine dated 07/06/2013. CLINICAL HISTORY: Fall one week ago, unsure if patient hit their head, decreased appetite. FINDINGS: BONES AND ALIGNMENT: Since the previous study, the patient has undergone vertebral augmentation at T12 and L1. There has been significant interval worsening of a compression fracture of L2 which has lost approximately 70% of its height centrally and 30% of its height anteriorly. There is mild buckling of the posterior wall of the vertebral body on the left. There is moderate downward bowing of the superior endplate of L5. There is mild rotatory levoscoliosis of the thoracolumbar spine. DEGENERATIVE CHANGES: No significant degenerative changes. SOFT TISSUES: No acute abnormality. IMPRESSION: 1. Significant interval worsening of a compression fracture of L2 with approximately 70% central and 30% anterior height loss, and mild buckling of the posterior wall on the left. 2. Moderate downward bowing of the superior endplate of L5. 3. Mild rotatory levoscoliosis of the thoracolumbar spine. Electronically signed by: Evalene Coho MD Apr 17, 2024 09:51 AM EDT RP Workstation: [id number]   CT ABDOMEN PELVIS WO CONTRAST Result Date: 17-Apr-2024 EXAM: CT ABDOMEN AND PELVIS WITHOUT CONTRAST April 17, 2024 09:37:54 AM TECHNIQUE: CT of the abdomen and pelvis was performed without the administration of intravenous contrast. Multiplanar reformatted images are provided for review. Automated exposure control, iterative reconstruction, and/or weight-based adjustment of the mA/kV was utilized to reduce the radiation dose to as low as reasonably achievable.  COMPARISON: CT of the abdomen and pelvis dated 01/28/2004. CLINICAL HISTORY: Abdominal pain, acute, nonlocalized. Fall one week ago, unsure if patient hit their head, decreased appetite. 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 There are ground-glass and reticular opacities in the dependent periphery of the lower lobes bilaterally. LIVER: Normal size and contour. GALLBLADDER AND BILE DUCTS: There is a calcified stone within the gallbladder. There is no evidence of cholecystitis. SPLEEN: Normal size. No focal lesion. PANCREAS: No mass. No ductal dilatation. ADRENAL GLANDS: Normal appearance. No mass. KIDNEYS, URETERS AND BLADDER: There is a septated bilobular cyst arising from the lower pole of the right kidney, measuring approximately 6.5 x 3.5 cm. There is a parapelvic cyst present centrally within the left kidney, measuring 4.3 x 3.4 cm. Per consensus, no follow-up is needed for simple Bosniak type 1 and 2 renal cysts, unless the patient has a malignancy history or risk factors. No stones in the ureters. No hydronephrosis. No perinephric or periureteral stranding. Urinary bladder is unremarkable. GI AND BOWEL: There is diffuse abnormal distention of the small bowel  which measures up to approximately 3.4 cm in diameter. It is distended with fluid. The colon is collapsed. 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 There is a large fluid-filled hiatus hernia. PERITONEUM AND RETROPERITONEUM: No ascites. No free air. VASCULATURE: The abdominal aorta is normal in caliber, but densely calcified. LYMPH NODES: No lymphadenopathy. REPRODUCTIVE ORGANS: The patient is status post hysterectomy and bilateral salpingo-oophorectomy. BONES AND SOFT TISSUES: There is mild levoscoliosis of the thoracolumbar spine. L1 vertebral bodies have been treated with vertebral augmentation. No acute osseous  abnormality. No focal soft tissue abnormality. IMPRESSION: 1. Diffuse abnormal distention of the small bowel with fluid, measuring up to approximately 3.4 cm in diameter. The colon is collapsed. Findings are consistent with distal small bowel obstruction versus ileus. 2. Large fluid-filled hiatus hernia. 3. Calcified stone within the gallbladder without evidence of cholecystitis. 4. Septated bilobular cyst in the lower pole of the right kidney, measuring approximately 6.5 x 3.5 cm. 5. Parapelvic cyst centrally within the left kidney, measuring 4.3 x 3.4 cm. Electronically signed by: Evalene Coho MD 04-13-24 09:47 AM EDT RP Workstation: [id number]   CT Head Wo Contrast Result Date: 2024-04-13 EXAM: CT HEAD WITHOUT CONTRAST 04-13-2024 09:37:54 AM TECHNIQUE: CT of the head was performed without the administration of intravenous contrast. Automated exposure control, iterative reconstruction, and/or weight based adjustment of the mA/kV was utilized to reduce the radiation dose to as low as reasonably achievable. COMPARISON: 08/24/2019 CLINICAL HISTORY: Mental status change, unknown cause. Fall one week ago, unsure if patient hit their head, decreased appetite. FINDINGS: BRAIN AND VENTRICLES: No acute hemorrhage. No evidence of acute infarct. No hydrocephalus. No extra-axial collection. No mass effect or midline shift. Moderate generalized cerebral volume loss. 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 ORBITS: No acute abnormality. SINUSES: Mild mucosal disease within the left maxillary sinus. SOFT TISSUES AND SKULL: No acute soft tissue abnormality. No skull fracture. Debris within the right external auditory canal. VASCULATURE: Calcifications within the carotid siphons. IMPRESSION: 1. No acute intracranial abnormality. 2. Moderate generalized cerebral volume loss and moderate periventricular and deep cerebral white matter disease. Electronically signed by: Evalene Coho MD Apr 13, 2024  09:40 AM EDT RP Workstation: [id number]     Assessment & Plan:  Areej Tayler is a 83 y.o. female with coffee ground emesis/ hematemesis in the setting of a large hiatal hernia and concern for possible Cameron's ulcer given the hernia and coffee ground emesis /hematemesis, no obvious signs of ischemic stomach or small bowel on the CT scan and no free air, but she does have a profound lactic acidosis. Her abdomen is soft but she is tender.   ACS risk calculator for enterectomy or gastrectomy demonstrate high likelihood of serious complication 50-60% and 35-40% chance of death. Discussed that she would have a high likelihood of staying intubated, ICU stay, not being able to be extubated, needing a trach. They are considering palliative measures. They would possible consider emergency surgery if we felt that she could survive. Discussed with these numbers she has a very high risk. At this time there is no definitive need for surgical intervention, and I also worry that any anesthesia, intubation will cause her to decompensate more.  NPO, NG placed at the beside with myself and RN Hiatal hernia, concern for Cameron's ulcer  High risk for any surgery, guarded prognosis Hospitalist admission PRN For pain PPI BID for possible gastritis/ ulcer  Fluid resuscitation Lactic acid repeat  All questions were answered to the satisfaction  of the patient and family.   Manuelita JAYSON Pander 04/12/2024, 12:31 PM

## 2024-05-04 NOTE — ED Notes (Signed)
 Foley placed but not enough urine to send off for urine sample at this time.

## 2024-05-04 NOTE — H&P (Signed)
 History and Physical    Patient: Jeanne Rogers FMW:[id number] DOB: Jun 01, 1941 DOA: May 02, 2024 DOS: the patient was seen and examined on 05/02/24 PCP: Cook, Jayce G, DO  Patient coming from: Home  Chief Complaint:  Chief Complaint  Patient presents with   Hematemesis   HPI: Jeanne Rogers is a 83 y.o. female with medical history significant  For dementia, chronic anemia, HTN, HLD and GERD/peptic ulcer disease who presents to the ED with coffee-ground emesis and is found to be tachycardic, hypotensive, lethargic and not really very verbal at this time  Additional history obtained from patient's daughter Dr.Meiki Parrales (who is an ED Physician) and patient's husband at bedside - In ED patient was found to have profound lactic acidosis with CT abdomen and pelvis demonstrating large hiatal hernia, pushing her heart anterior and to the left, and dilated small bowel but no signs of free air or pneumatosis -- --EDP consulted general surgery due to concerns about possible ischemic bowel -Lactic acid was greater than 9 despite IV fluids repeat lactic acid was still greater than 9 -UA was not suggestive of UTI -- Stool occult blood was negative - Lipase was 45 -WBC was 2.4, hemoglobin 15.3 platelets 503 -Creatinine was up to 2.41 it was 0.87 on 10/21/2023 -INR 1.1 -CT head without acute findings, -CT of the L-spine with compression fractures of L2 - Patient's daughter Dr. Sallie Rumalda who is an ED physician--discussed case with EDP Dr. Garrick and general surgeon Dr. Kallie--- patient was felt to be a poor candidate for any surgical intervention -- Family agreed to transition to comfort care -- Palliative care input appreciated   Review of Systems: As mentioned in the history of present illness. All other systems reviewed and are negative. Past Medical History:  Diagnosis Date   Anemia    Collapse, lung    Complication of anesthesia    very sensitive to anesthesia   GERD  (gastroesophageal reflux disease)    Hyperlipidemia    Hypertension    Osteoporosis    PONV (postoperative nausea and vomiting)    PUD (peptic ulcer disease)    Spontaneous pneumothorax    Past Surgical History:  Procedure Laterality Date   ABDOMINAL HYSTERECTOMY     COLONOSCOPY N/A 01/04/2014   Procedure: COLONOSCOPY;  Surgeon: Claudis RAYMOND Rivet, MD;  Location: [facility];  Service: Endoscopy;  Laterality: N/A;  200   ESOPHAGOGASTRODUODENOSCOPY N/A 01/04/2014   Procedure: ESOPHAGOGASTRODUODENOSCOPY (EGD);  Surgeon: Claudis RAYMOND Rivet, MD;  Location: [facility];  Service: Endoscopy;  Laterality: N/A;   hysterectomy     KYPHOPLASTY N/A 08/08/2020   Procedure: KYPHOPLASTY THORACIC EIGHT AND THORACIC TWELVE;  Surgeon: Burnetta Aures, MD;  Location: [facility];  Service: Orthopedics;  Laterality: N/A;   LEFT HEART CATH AND CORONARY ANGIOGRAPHY N/A 05/10/2020   Procedure: LEFT HEART CATH AND CORONARY ANGIOGRAPHY;  Surgeon: Claudene Victory ORN, MD;  Location: [facility];  Service: Cardiovascular;  Laterality: N/A;   MASS EXCISION Right 02/07/2016   Procedure: EXCISION MASS RIGHT INDEX FINGER AND RIGHT HAND MASS;  Surgeon: Franky Curia, MD;  Location: [facility];  Service: Orthopedics;  Laterality: Right;   OOPHORECTOMY Bilateral 08/05/2003   SPINE SURGERY     Social History:  reports that she quit smoking about 35 years ago. Her smoking use included cigarettes. She has never used smokeless tobacco. She reports that she does not drink alcohol  and does not use drugs.  Allergies  Allergen Reactions  Clopidogrel      Pain in thighs    Methergine [Methylergonovine Maleate] Other (See Comments)    Unknown   Statins Other (See Comments)    Myalgia    Family History  Problem Relation Age of Onset   Hyperlipidemia Mother    Hypertension Father     Prior to Admission medications   Medication Sig Start Date End Date Taking? Authorizing Provider  acetaminophen  (TYLENOL ) 500 MG  tablet Take 500 mg by mouth 2 (two) times daily.   Yes [provider]  aspirin  EC 81 MG tablet Take 81 mg by mouth every evening. Swallow whole.   Yes [provider]  citalopram  (CELEXA ) 10 MG tablet TAKE 1 TABLET(10 MG) BY MOUTH DAILY 09/14/23  Yes Cook, Jayce G, DO  ezetimibe  (ZETIA ) 10 MG tablet TAKE 1 TABLET(10 MG) BY MOUTH DAILY 10/22/23  Yes Cook, Jayce G, DO  isosorbide  mononitrate (IMDUR ) 30 MG 24 hr tablet TAKE 1 TABLET(30 MG) BY MOUTH DAILY 08/20/23  Yes Miriam Norris, NP  memantine  (NAMENDA ) 10 MG tablet TAKE 1 TABLET(10 MG) BY MOUTH TWICE DAILY 09/22/23  Yes Cook, Jayce G, DO  metoprolol  succinate (TOPROL -XL) 25 MG 24 hr tablet TAKE 1/2 TABLET BY MOUTH EVERY DAY 02/18/24  Yes Nishan, Peter C, MD  nitrofurantoin , macrocrystal-monohydrate, (MACROBID ) 100 MG capsule Take 1 capsule (100 mg total) by mouth at bedtime. 01/05/24  Yes Bluford Jacqulyn MATSU, DO    Physical Exam: Vitals:   04/30/24 1315 2024/04/30 1330 2024-04-30 1335 2024-04-30 1419  BP: (!) 94/57 (!) 101/27 93/61   Pulse: (!) 126     Resp: (!) 37 (!) 34 (!) 29   Temp:    99.3 F (37.4 C)  TempSrc:    Rectal  SpO2: (!) 86%       Physical Exam  Gen:-Lethargic, groaning, moaning HEENT:- [place].AT, No sclera icterus Nose- NG tube Neck-Supple Neck,No JVD,.  Lungs-diminished breath sounds, no wheezing  CV- S1, S2 normal, RRR, tachycardic Abd-diminished bowel sounds, patient is nonverbal, unable to quantify tenderness/pain, less distended after NG tube placement Extremity/Skin:- No  edema,   good pedal pulses  Neuro-Psych-lethargic and nonverbal  Data Reviewed:  CT abdomen and pelvis demonstrating large hiatal hernia, pushing her heart anterior and to the left, and dilated small bowel but no signs of free air or pneumatosis -- --EDP consulted general surgery due to concerns about possible ischemic bowel -Lactic acid was greater than 9 despite IV fluids repeat lactic acid was still greater than 9 -UA was not suggestive  of UTI -- Stool occult blood was negative - Lipase was 45 -WBC was 2.4, hemoglobin 15.3 platelets 503 -Creatinine was up to 2.41 it was 0.87 on 10/21/2023 -INR 1.1 -CT head without acute findings, -CT of the L-spine with compression fractures of L2 -   Assessment and Plan: 1)Distal small bowel obstruction -- -Lactic acid was greater than 9 despite IV fluids repeat lactic acid was still greater than 9 EDP consulted general surgery due to concerns about possible ischemic bowel --General Surgery consult from Dr. Kallie appreciated Patient's daughter Dr. Sallie Ee who is an ED physician--discussed case with EDP Dr. Garrick and general surgeon Dr. Kallie--- patient was felt to be a poor candidate for any surgical intervention -- Family agreed to transition to comfort care -- Palliative care input appreciated  2) severe lactic acidosis--due to the above and above - Cannot rule out ischemic bowel - Per general surgeon not a good candidate for operative intervention - Per family request  transition to full comfort care  3) social/ethics--palliative care consult appreciated Discussed with patient's daughter Dr.Meiki Manternach (who is an ED Physician) and patient's husband at bedside -- Family request transition to comfort care - DNR/DNI  4)AKI----acute kidney injury -due to #1 above --Creatinine was up to 2.41 it was 0.87 on 10/21/2023 -Family requested transition to full comfort care    Advance Care Planning:   Code Status: Limited: Do not attempt resuscitation (DNR) -DNR-LIMITED -Do Not Intubate/DNI    Consults: Palliative care and general surgery  Family Communication: Discussed with patient's daughter Dr.Meiki Ramp (who is an ED Physician) and patient's husband at bedside  Severity of Illness: The appropriate patient status for this patient is INPATIENT. Inpatient status is judged to be reasonable and necessary in order to provide the required intensity of service to ensure the  patient's safety. The patient's presenting symptoms, physical exam findings, and initial radiographic and laboratory data in the context of their chronic comorbidities is felt to place them at high risk for further clinical deterioration. Furthermore, it is not anticipated that the patient will be medically stable for discharge from the hospital within 2 midnights of admission.   * I certify that at the point of admission it is my clinical judgment that the patient will require inpatient hospital care spanning beyond 2 midnights from the point of admission due to high intensity of service, high risk for further deterioration and high frequency of surveillance required.*  Author: Rendall Carwin, MD April 12, 2024 3:36 PM  For on call review [email or web address].

## 2024-05-04 NOTE — Progress Notes (Signed)
   21-Apr-2024 1536  Spiritual Encounters  Type of Visit Initial  Care provided to: Pt and family  Conversation partners present during encounter Nurse  Referral source Physician  Reason for visit End-of-life  OnCall Visit No  Interventions  Spiritual Care Interventions Made Established relationship of care and support;Compassionate presence;Bereavement/grief support;Prayer;Supported grief process   Chaplain was called by the PA to attend to family as Pt was at end of life.  Chaplain provided compassionate presence and the ritual of Christian prayer.  Chaplain remained at bedside with family until Pt passed.  Maude Roll, MDiv  Chaplain, [facility] [email or web address] [phone]

## 2024-05-04 DEATH — deceased

## 2024-05-27 ENCOUNTER — Ambulatory Visit

## 2024-07-04 ENCOUNTER — Ambulatory Visit: Admitting: Nurse Practitioner
